# Patient Record
Sex: Male | Born: 1960 | Race: White | Hispanic: No | Marital: Married | Smoking: Former smoker
Health system: Southern US, Community
[De-identification: ages and names within clinical notes are randomized; demographics above are authoritative.]

## PROBLEM LIST (undated history)

## (undated) DIAGNOSIS — I2510 Atherosclerotic heart disease of native coronary artery without angina pectoris: Secondary | ICD-10-CM

## (undated) DIAGNOSIS — M4620 Osteomyelitis of vertebra, site unspecified: Secondary | ICD-10-CM

## (undated) DIAGNOSIS — F1010 Alcohol abuse, uncomplicated: Secondary | ICD-10-CM

## (undated) DIAGNOSIS — M4622 Osteomyelitis of vertebra, cervical region: Secondary | ICD-10-CM

## (undated) DIAGNOSIS — E669 Obesity, unspecified: Secondary | ICD-10-CM

## (undated) DIAGNOSIS — M542 Cervicalgia: Secondary | ICD-10-CM

## (undated) DIAGNOSIS — I340 Nonrheumatic mitral (valve) insufficiency: Secondary | ICD-10-CM

## (undated) DIAGNOSIS — Z720 Tobacco use: Secondary | ICD-10-CM

## (undated) DIAGNOSIS — E1169 Type 2 diabetes mellitus with other specified complication: Secondary | ICD-10-CM

## (undated) HISTORY — PX: OTHER SURGICAL HISTORY: SHX169

---

## 2005-08-22 HISTORY — PX: HERNIA REPAIR: SHX51

## 2017-07-20 DIAGNOSIS — R531 Weakness: Secondary | ICD-10-CM

## 2017-07-20 DIAGNOSIS — I10 Essential (primary) hypertension: Secondary | ICD-10-CM

## 2017-07-20 DIAGNOSIS — I350 Nonrheumatic aortic (valve) stenosis: Secondary | ICD-10-CM

## 2017-07-20 DIAGNOSIS — I712 Thoracic aortic aneurysm, without rupture: Secondary | ICD-10-CM

## 2017-07-20 DIAGNOSIS — M2550 Pain in unspecified joint: Secondary | ICD-10-CM

## 2017-07-20 DIAGNOSIS — R0902 Hypoxemia: Secondary | ICD-10-CM

## 2017-07-20 DIAGNOSIS — F17200 Nicotine dependence, unspecified, uncomplicated: Secondary | ICD-10-CM

## 2017-07-20 DIAGNOSIS — N390 Urinary tract infection, site not specified: Secondary | ICD-10-CM

## 2017-07-20 DIAGNOSIS — E871 Hypo-osmolality and hyponatremia: Secondary | ICD-10-CM

## 2017-07-25 ENCOUNTER — Inpatient Hospital Stay (HOSPITAL_COMMUNITY): Payer: Medicaid Other

## 2017-07-25 ENCOUNTER — Other Ambulatory Visit: Payer: Self-pay

## 2017-07-25 ENCOUNTER — Inpatient Hospital Stay (HOSPITAL_COMMUNITY)
Admission: AD | Admit: 2017-07-25 | Discharge: 2017-08-10 | DRG: 853 | Disposition: A | Discharge status: Rehabilitation Facility | Payer: Medicaid Other | Source: Other Acute Inpatient Hospital | Attending: Family Medicine | Admitting: Family Medicine

## 2017-07-25 ENCOUNTER — Encounter (HOSPITAL_COMMUNITY): Payer: Self-pay

## 2017-07-25 DIAGNOSIS — Z79899 Other long term (current) drug therapy: Secondary | ICD-10-CM

## 2017-07-25 DIAGNOSIS — I5031 Acute diastolic (congestive) heart failure: Secondary | ICD-10-CM | POA: Diagnosis present

## 2017-07-25 DIAGNOSIS — I5033 Acute on chronic diastolic (congestive) heart failure: Secondary | ICD-10-CM | POA: Diagnosis present

## 2017-07-25 DIAGNOSIS — M009 Pyogenic arthritis, unspecified: Secondary | ICD-10-CM | POA: Diagnosis present

## 2017-07-25 DIAGNOSIS — F17210 Nicotine dependence, cigarettes, uncomplicated: Secondary | ICD-10-CM | POA: Diagnosis present

## 2017-07-25 DIAGNOSIS — R7881 Bacteremia: Secondary | ICD-10-CM

## 2017-07-25 DIAGNOSIS — I6340 Cerebral infarction due to embolism of unspecified cerebral artery: Secondary | ICD-10-CM | POA: Diagnosis present

## 2017-07-25 DIAGNOSIS — D696 Thrombocytopenia, unspecified: Secondary | ICD-10-CM | POA: Diagnosis not present

## 2017-07-25 DIAGNOSIS — I330 Acute and subacute infective endocarditis: Secondary | ICD-10-CM | POA: Diagnosis present

## 2017-07-25 DIAGNOSIS — K0530 Chronic periodontitis, unspecified: Secondary | ICD-10-CM | POA: Diagnosis present

## 2017-07-25 DIAGNOSIS — E873 Alkalosis: Secondary | ICD-10-CM | POA: Diagnosis present

## 2017-07-25 DIAGNOSIS — D6489 Other specified anemias: Secondary | ICD-10-CM | POA: Diagnosis not present

## 2017-07-25 DIAGNOSIS — I700 Atherosclerosis of aorta: Secondary | ICD-10-CM | POA: Diagnosis present

## 2017-07-25 DIAGNOSIS — E1142 Type 2 diabetes mellitus with diabetic polyneuropathy: Secondary | ICD-10-CM | POA: Diagnosis present

## 2017-07-25 DIAGNOSIS — Z791 Long term (current) use of non-steroidal anti-inflammatories (NSAID): Secondary | ICD-10-CM

## 2017-07-25 DIAGNOSIS — R29706 NIHSS score 6: Secondary | ICD-10-CM | POA: Diagnosis present

## 2017-07-25 DIAGNOSIS — F10239 Alcohol dependence with withdrawal, unspecified: Secondary | ICD-10-CM | POA: Diagnosis present

## 2017-07-25 DIAGNOSIS — G8929 Other chronic pain: Secondary | ICD-10-CM | POA: Diagnosis present

## 2017-07-25 DIAGNOSIS — Z7984 Long term (current) use of oral hypoglycemic drugs: Secondary | ICD-10-CM

## 2017-07-25 DIAGNOSIS — A4102 Sepsis due to Methicillin resistant Staphylococcus aureus: Principal | ICD-10-CM | POA: Diagnosis present

## 2017-07-25 DIAGNOSIS — J069 Acute upper respiratory infection, unspecified: Secondary | ICD-10-CM

## 2017-07-25 DIAGNOSIS — I059 Rheumatic mitral valve disease, unspecified: Secondary | ICD-10-CM

## 2017-07-25 DIAGNOSIS — G4733 Obstructive sleep apnea (adult) (pediatric): Secondary | ICD-10-CM | POA: Diagnosis not present

## 2017-07-25 DIAGNOSIS — I110 Hypertensive heart disease with heart failure: Secondary | ICD-10-CM

## 2017-07-25 DIAGNOSIS — E669 Obesity, unspecified: Secondary | ICD-10-CM | POA: Diagnosis not present

## 2017-07-25 DIAGNOSIS — Z4659 Encounter for fitting and adjustment of other gastrointestinal appliance and device: Secondary | ICD-10-CM

## 2017-07-25 DIAGNOSIS — J9600 Acute respiratory failure, unspecified whether with hypoxia or hypercapnia: Secondary | ICD-10-CM

## 2017-07-25 DIAGNOSIS — J9601 Acute respiratory failure with hypoxia: Secondary | ICD-10-CM | POA: Diagnosis present

## 2017-07-25 DIAGNOSIS — K0889 Other specified disorders of teeth and supporting structures: Secondary | ICD-10-CM | POA: Diagnosis present

## 2017-07-25 DIAGNOSIS — I2510 Atherosclerotic heart disease of native coronary artery without angina pectoris: Secondary | ICD-10-CM | POA: Diagnosis present

## 2017-07-25 DIAGNOSIS — M5030 Other cervical disc degeneration, unspecified cervical region: Secondary | ICD-10-CM | POA: Diagnosis present

## 2017-07-25 DIAGNOSIS — R011 Cardiac murmur, unspecified: Secondary | ICD-10-CM | POA: Diagnosis not present

## 2017-07-25 DIAGNOSIS — M4622 Osteomyelitis of vertebra, cervical region: Secondary | ICD-10-CM | POA: Diagnosis not present

## 2017-07-25 DIAGNOSIS — Z716 Tobacco abuse counseling: Secondary | ICD-10-CM

## 2017-07-25 DIAGNOSIS — M549 Dorsalgia, unspecified: Secondary | ICD-10-CM | POA: Diagnosis not present

## 2017-07-25 DIAGNOSIS — K089 Disorder of teeth and supporting structures, unspecified: Secondary | ICD-10-CM

## 2017-07-25 DIAGNOSIS — Z8249 Family history of ischemic heart disease and other diseases of the circulatory system: Secondary | ICD-10-CM

## 2017-07-25 DIAGNOSIS — F10221 Alcohol dependence with intoxication delirium: Secondary | ICD-10-CM | POA: Diagnosis present

## 2017-07-25 DIAGNOSIS — G9349 Other encephalopathy: Secondary | ICD-10-CM | POA: Diagnosis present

## 2017-07-25 DIAGNOSIS — J440 Chronic obstructive pulmonary disease with acute lower respiratory infection: Secondary | ICD-10-CM | POA: Diagnosis present

## 2017-07-25 DIAGNOSIS — M542 Cervicalgia: Secondary | ICD-10-CM

## 2017-07-25 DIAGNOSIS — I459 Conduction disorder, unspecified: Secondary | ICD-10-CM | POA: Diagnosis present

## 2017-07-25 DIAGNOSIS — Z978 Presence of other specified devices: Secondary | ICD-10-CM

## 2017-07-25 DIAGNOSIS — I272 Pulmonary hypertension, unspecified: Secondary | ICD-10-CM | POA: Diagnosis present

## 2017-07-25 DIAGNOSIS — I76 Septic arterial embolism: Secondary | ICD-10-CM | POA: Diagnosis present

## 2017-07-25 DIAGNOSIS — Z6839 Body mass index (BMI) 39.0-39.9, adult: Secondary | ICD-10-CM | POA: Diagnosis not present

## 2017-07-25 DIAGNOSIS — K041 Necrosis of pulp: Secondary | ICD-10-CM | POA: Diagnosis present

## 2017-07-25 DIAGNOSIS — K045 Chronic apical periodontitis: Secondary | ICD-10-CM | POA: Diagnosis present

## 2017-07-25 DIAGNOSIS — I058 Other rheumatic mitral valve diseases: Secondary | ICD-10-CM

## 2017-07-25 DIAGNOSIS — M264 Malocclusion, unspecified: Secondary | ICD-10-CM | POA: Diagnosis present

## 2017-07-25 DIAGNOSIS — G9340 Encephalopathy, unspecified: Secondary | ICD-10-CM | POA: Diagnosis present

## 2017-07-25 DIAGNOSIS — J189 Pneumonia, unspecified organism: Secondary | ICD-10-CM | POA: Diagnosis present

## 2017-07-25 DIAGNOSIS — F419 Anxiety disorder, unspecified: Secondary | ICD-10-CM | POA: Diagnosis present

## 2017-07-25 DIAGNOSIS — I509 Heart failure, unspecified: Secondary | ICD-10-CM

## 2017-07-25 DIAGNOSIS — R0902 Hypoxemia: Secondary | ICD-10-CM

## 2017-07-25 DIAGNOSIS — R51 Headache: Secondary | ICD-10-CM | POA: Diagnosis not present

## 2017-07-25 DIAGNOSIS — M6088 Other myositis, other site: Secondary | ICD-10-CM | POA: Diagnosis present

## 2017-07-25 DIAGNOSIS — I340 Nonrheumatic mitral (valve) insufficiency: Secondary | ICD-10-CM | POA: Diagnosis present

## 2017-07-25 DIAGNOSIS — K029 Dental caries, unspecified: Secondary | ICD-10-CM | POA: Diagnosis present

## 2017-07-25 DIAGNOSIS — B9562 Methicillin resistant Staphylococcus aureus infection as the cause of diseases classified elsewhere: Secondary | ICD-10-CM | POA: Diagnosis present

## 2017-07-25 DIAGNOSIS — A409 Streptococcal sepsis, unspecified: Secondary | ICD-10-CM

## 2017-07-25 DIAGNOSIS — A419 Sepsis, unspecified organism: Secondary | ICD-10-CM

## 2017-07-25 DIAGNOSIS — E1165 Type 2 diabetes mellitus with hyperglycemia: Secondary | ICD-10-CM | POA: Diagnosis not present

## 2017-07-25 DIAGNOSIS — I712 Thoracic aortic aneurysm, without rupture: Secondary | ICD-10-CM | POA: Diagnosis present

## 2017-07-25 DIAGNOSIS — R5381 Other malaise: Secondary | ICD-10-CM | POA: Diagnosis not present

## 2017-07-25 DIAGNOSIS — K083 Retained dental root: Secondary | ICD-10-CM | POA: Diagnosis present

## 2017-07-25 DIAGNOSIS — I669 Occlusion and stenosis of unspecified cerebral artery: Secondary | ICD-10-CM

## 2017-07-25 DIAGNOSIS — F1010 Alcohol abuse, uncomplicated: Secondary | ICD-10-CM | POA: Diagnosis present

## 2017-07-25 DIAGNOSIS — Z8614 Personal history of Methicillin resistant Staphylococcus aureus infection: Secondary | ICD-10-CM

## 2017-07-25 DIAGNOSIS — Z885 Allergy status to narcotic agent status: Secondary | ICD-10-CM

## 2017-07-25 DIAGNOSIS — R748 Abnormal levels of other serum enzymes: Secondary | ICD-10-CM | POA: Diagnosis present

## 2017-07-25 DIAGNOSIS — R29810 Facial weakness: Secondary | ICD-10-CM | POA: Diagnosis not present

## 2017-07-25 HISTORY — DX: Type 2 diabetes mellitus with other specified complication: E66.9

## 2017-07-25 HISTORY — DX: Cervicalgia: M54.2

## 2017-07-25 HISTORY — DX: Tobacco use: Z72.0

## 2017-07-25 HISTORY — DX: Type 2 diabetes mellitus with other specified complication: E11.69

## 2017-07-25 HISTORY — DX: Alcohol abuse, uncomplicated: F10.10

## 2017-07-25 LAB — CREATININE, SERUM: Creatinine, Ser: 0.8 mg/dL (ref 0.61–1.24)

## 2017-07-25 MED ORDER — ONDANSETRON HCL 4 MG/2ML IJ SOLN: 4.0000 mg | Freq: Four times a day (QID) | INTRAMUSCULAR | Status: DC | PRN

## 2017-07-25 MED ORDER — VANCOMYCIN HCL 10 G IV SOLR
2500.0000 mg | Freq: Once | INTRAVENOUS | Status: DC
  Filled 2017-07-25: qty 2500

## 2017-07-25 MED ORDER — LORAZEPAM 1 MG PO TABS: 1.0000 mg | ORAL_TABLET | Freq: Four times a day (QID) | ORAL | Status: DC | PRN

## 2017-07-25 MED ORDER — ACETAMINOPHEN 650 MG RE SUPP
650.0000 mg | Freq: Four times a day (QID) | RECTAL | Status: DC | PRN
  Filled 2017-07-25: qty 1

## 2017-07-25 MED ORDER — ACETAMINOPHEN 325 MG PO TABS
650.0000 mg | ORAL_TABLET | Freq: Four times a day (QID) | ORAL | Status: DC | PRN
  Filled 2017-07-25: qty 2

## 2017-07-25 MED ORDER — ONDANSETRON HCL 4 MG PO TABS
4.0000 mg | ORAL_TABLET | Freq: Four times a day (QID) | ORAL | Status: DC | PRN
Start: 2017-07-25 — End: 2017-07-27

## 2017-07-25 MED ORDER — VANCOMYCIN HCL IN DEXTROSE 1-5 GM/200ML-% IV SOLN: 1000.0000 mg | Freq: Once | INTRAVENOUS | Status: DC

## 2017-07-25 MED ORDER — ADULT MULTIVITAMIN W/MINERALS CH: 1.0000 | ORAL_TABLET | Freq: Every day | ORAL | Status: DC

## 2017-07-25 MED ORDER — VITAMIN B-1 100 MG PO TABS: 100.0000 mg | ORAL_TABLET | Freq: Every day | ORAL | Status: DC

## 2017-07-25 MED ORDER — LORAZEPAM 2 MG/ML IJ SOLN
1.0000 mg | Freq: Four times a day (QID) | INTRAMUSCULAR | Status: DC | PRN
  Administered 2017-07-26 (×2): 1 mg via INTRAVENOUS
  Filled 2017-07-25 (×2): qty 1

## 2017-07-25 MED ORDER — INSULIN ASPART 100 UNIT/ML ~~LOC~~ SOLN
0.0000 [IU] | SUBCUTANEOUS | Status: DC
  Administered 2017-07-25 – 2017-07-26 (×3): 5 [IU] via SUBCUTANEOUS
  Administered 2017-07-26: 3 [IU] via SUBCUTANEOUS
  Administered 2017-07-26: 5 [IU] via SUBCUTANEOUS
  Administered 2017-07-26 – 2017-07-27 (×2): 8 [IU] via SUBCUTANEOUS
  Administered 2017-07-27: 2 [IU] via SUBCUTANEOUS
  Administered 2017-07-27: 4 [IU] via SUBCUTANEOUS

## 2017-07-25 MED ORDER — ENOXAPARIN SODIUM 60 MG/0.6ML ~~LOC~~ SOLN
60.0000 mg | SUBCUTANEOUS | Status: DC
  Administered 2017-07-25: 60 mg via SUBCUTANEOUS
  Filled 2017-07-25 (×2): qty 0.6

## 2017-07-25 MED ORDER — IPRATROPIUM-ALBUTEROL 0.5-2.5 (3) MG/3ML IN SOLN: 3.0000 mL | RESPIRATORY_TRACT | Status: DC | PRN

## 2017-07-25 MED ORDER — FOLIC ACID 1 MG PO TABS: 1.0000 mg | ORAL_TABLET | Freq: Every day | ORAL | Status: DC

## 2017-07-25 MED ORDER — THIAMINE HCL 100 MG/ML IJ SOLN: 100.0000 mg | Freq: Every day | INTRAMUSCULAR | Status: DC

## 2017-07-25 NOTE — Progress Notes (Addendum)
Pharmacy Antibiotic Note  Reginald [name] is a 56 y.o. male admitted on 07/25/2017 with sepsis.  Pharmacy has been consulted for vancomycin dosing.  [facility] [place] from [facility].  Labs, admission note pending.  Plan: Vancomycin 2500 mg IV x 1. F/u SCr to determine further doses.  ADDENDUM: 07/25/2017 10:14 PM RN reports patient is currently on vancomycin from [facility]. The dose is 2000 mg and has not finished the current dose. RN also reporting what sounds like Red Man Syndrome.  Told her to slow the infusion rate and contact MD for further instruction.  Plan: Cancel vancomycin 2500 mg order.  F/u labs and plan from MD.  Height: 5\' 9"  (175.3 cm) Weight: 264 lb 15.9 oz (120.2 kg) IBW/kg (Calculated) : 70.7  Temp (24hrs), Avg:98.3 F (36.8 C), Min:97.7 F (36.5 C), Max:98.6 F (37 C)  No results for input(s): WBC, CREATININE, LATICACIDVEN, VANCOTROUGH, VANCOPEAK, VANCORANDOM, GENTTROUGH, GENTPEAK, GENTRANDOM, TOBRATROUGH, TOBRAPEAK, TOBRARND, AMIKACINPEAK, AMIKACINTROU, AMIKACIN in the last 168 hours.  CrCl cannot be calculated (No order found.).    Allergies  Allergen Reactions  . Morphine And Related      Thank you for allowing pharmacy to be a part of this patient's care.  Clance BollRunyon, Raivyn Kabler 07/25/2017 9:27 PM

## 2017-07-25 NOTE — Progress Notes (Addendum)
Upon assessment, RN noticed Pts skin was very red. Per day shift RN report, [facility] on Vancomycin gtt. RN stopped Valetta FullerVanc gtt, attending MD notified. Pt not in distress, VS stable. RN will continue to monitor.

## 2017-07-25 NOTE — H&P (Signed)
History and Physical    Reginald [name] PVX:[id number] DOB: August 02, 1961 DOA: 07/25/2017  Referring MD/NP/PA: Charlann Lange, PA-C PCP: Patient, No Pcp Per  Patient coming from: Transferred from [facility]  Chief Complaint:   I have personally briefly reviewed patient's old medical records in [place]   HPI: Reginald [name] is a 56 y.o. male with medical history significant of diastolic CHF, AAA, COPD, DM type II, tobacco abuse, and alcohol abuse; who presents as a transfer from [facility] for MRSA bacteremia.  Patient was hospitalized from 11/29 -12/4, and had initially presented with nonspecific complaints of generalized weakness, low-grade fever, vomiting, and diffuse muscle aches. In the emergency department patient was noted to be hypoxic requiring oxygen which he was not on at baseline.  Patient was found to be negative for influenza and had negative CT after noting steroid injection to the neck on 11/28.  Blood cultures had been obtained on admission and noted to grow out MRSA.  Antibiotics of vancomycin were added at that time.  2D echo revealed signs of an anterior mitral valve vegetation and EF was noted to be around 60%.  They have been administering IV Lasix to help diurese the patient as he appeared to x-rays initially showed signs of pulmonary edema.  He went into significant alcohol withdrawals requiring transfer to the ICU and initiation of Precedex drip on 12/1-2, but gtt was able to be weaned off on 12/3.  Repeat blood cultures on 12/1 and 12/3 were noted to be positive for MRSA, despite antibiotics.  Urine cultures did not grow out any specific bacteria and he was discontinued on Rocephin.  Dr. Johnnye Sima of infectious disease was consulted at [facility] and it was recommended to transfer the patient.  It appears during his hospital stay patient was also noted to a 4.5 cm ascending aortic aneurysm and MRI of the cervical spine showed degenerative changes without discitis or  fractures.  ED Course: As seen above   Review of Systems  Unable to perform ROS: Mental status change  Musculoskeletal: Positive for neck pain.  Neurological: Positive for speech change.    Past Medical History:  Diagnosis Date  . Alcohol abuse   . Diabetes mellitus type 2 in obese ([place])   . Neck pain   . Tobacco abuse     Past Surgical History:  Procedure Laterality Date  . HERNIA REPAIR  2007  . Tonsillectomy /adnoidectomy     as aa child     reports that he has been smoking cigarettes.  he has never used smokeless tobacco. He reports that he drinks about 2.4 - 3.0 oz of alcohol per week. He reports that he does not use drugs.  Allergies  Allergen Reactions  . Morphine And Related    Family history positive for diabetes  Prior to Admission medications   Not on File    Physical Exam:  Constitutional: Obese male who is lethargic, but will awake for short period in time prior to falling back asleep. Vitals:   07/25/17 1815 07/25/17 1838 07/25/17 2000 07/25/17 2001  BP: (!) 169/81  (!) 146/79   Pulse: 85  86   Resp: 19  (!) 22   Temp: 98.6 F (37 C) 98.6 F (37 C)  97.7 F (36.5 C)  TempSrc: Oral Oral  Oral  SpO2: 93%  (!) 89%   Weight: 120.2 kg (264 lb 15.9 oz)     Height: _0  (1.753 m)      Eyes: PERRL, lids  and conjunctivae normal ENMT: Mucous membranes are dry. Posterior pharynx clear of any exudate or lesions.  Neck: Patient grimaces in pain with any manipulation of neck Respiratory: Decreased overall aeration with positive crackles appreciated. Cardiovascular: Regular rate and rhythm, no murmurs / rubs / gallops. No extremity edema. 2+ pedal pulses. No carotid bruits.  Abdomen: no tenderness, no masses palpated. No hepatosplenomegaly. Bowel sounds positive.  Musculoskeletal: no clubbing / cyanosis. No joint deformity upper and lower extremities. Good ROM, no contractures. Normal muscle tone.  Skin: no rashes, lesions, ulcers. No  induration Neurologic: Patient able to move all extremities Psychiatric: Lethargic, and oriented only to person.    Labs on Admission: I have personally reviewed following labs and imaging studies  CBC: No results for input(s): WBC, NEUTROABS, HGB, HCT, MCV, PLT in the last 168 hours. Basic Metabolic Panel: No results for input(s): NA, K, CL, CO2, GLUCOSE, BUN, CREATININE, CALCIUM, MG, PHOS in the last 168 hours. GFR: CrCl cannot be calculated (No order found.). Liver Function Tests: No results for input(s): AST, ALT, ALKPHOS, BILITOT, PROT, ALBUMIN in the last 168 hours. No results for input(s): LIPASE, AMYLASE in the last 168 hours. No results for input(s): AMMONIA in the last 168 hours. Coagulation Profile: No results for input(s): INR, PROTIME in the last 168 hours. Cardiac Enzymes: No results for input(s): CKTOTAL, CKMB, CKMBINDEX, TROPONINI in the last 168 hours. BNP (last 3 results) No results for input(s): PROBNP in the last 8760 hours. HbA1C: No results for input(s): HGBA1C in the last 72 hours. CBG: No results for input(s): GLUCAP in the last 168 hours. Lipid Profile: No results for input(s): CHOL, HDL, LDLCALC, TRIG, CHOLHDL, LDLDIRECT in the last 72 hours. Thyroid Function Tests: No results for input(s): TSH, T4TOTAL, FREET4, T3FREE, THYROIDAB in the last 72 hours. Anemia Panel: No results for input(s): VITAMINB12, FOLATE, FERRITIN, TIBC, IRON, RETICCTPCT in the last 72 hours. Urine analysis: No results found for: COLORURINE, APPEARANCEUR, LABSPEC, PHURINE, GLUCOSEU, HGBUR, BILIRUBINUR, KETONESUR, PROTEINUR, UROBILINOGEN, NITRITE, LEUKOCYTESUR Sepsis Labs: No results found for this or any previous visit (from the past 240 hour(s)).   Radiological Exams on Admission: No results found.   Assessment/Plan MRSA bacteremia with mitral valve vegetation: Acute.  Patient noted to have persistent positive blood cultures despite IV antibiotics of vancomycin. - Admit to  stepdown bed - Check repeat blood cultures - Continue vancomycin as tolerated - Dr. Graylon Good of infectious disease consulted, will follow up further recommendations - Check MRI of the thoracic and lumbar spine with contrast per ID - Will need cardiothoracic surgery consult in a.m.   Acute encephalopathy: Patient still appears quite altered and only oriented to person at this time.  Initially thought related to alcohol and infection.  Addendum: overnight Patient noted to have acute change with intermittant left-sided weakness and facial droop. - Neurochecks every 4 hours - Check MRI of the brain without contrast - Speech therapy consult  Hypoxic respiratory failure, OSA: Acute.  Patient still on nasal cannula oxygen maintaining O2 saturations.  Physical exam reveals positive crackles. - Continuous pulse oximetry with nasal cannula oxygen as needed - CPAP qhs and prn - Check portable chest x-ray  Diastolic CHF exacerbation: Last echocardiogram performed on 11/30 showing mitral valve vegetation on the anterior leaflet and EF of approximately 60%. - Strict I&O's and daily weights - Lasix 40 mg IV x1 dose, reassess in a.m. and will diurese as needed - Message sent for cardiology to  see in a.m.  Neck pain/arthralgia: Chronic.  Patient evaluated with MRI of the cervical spine which showed only degenerative changes. - May want to follow-up with [facility] regarding CRP, ESR, RA, and CCP antibodies. - Fentanyl IV prn pain overnight  Diabetes mellitus type 2, uncontrolled.  Patient only on oral medications of metformin and glipizide at home but blood sugars noted to be in the 300s. - Hypoglycemic protocol - Hold metformin and glipizide - CBGs every 4 hours with moderate SSI  Alcohol abuse: Patient had been noted to go into alcohol withdrawals requiring patient to be placed on Precedex drip, but this was discontinuedon 12/3 prior to transfer. - CWIA protocols without schedule  Ativan  Thrombocytopenia: Platelet count noted to be 128.  Suspected to be related to patient's history of alcohol abuse.  Patient had right upper quadrant ultrasound noting signs of cirrhosis all at the outside facility. - Repeat CBC  Abnormal liver enzymes: Patient had been noted to have improving liver enzymes. - Check CMP in a.m.  AAA: Stable patient noted to have a 4.5 ascending aortic aneurysm.   - Patient will need semiannual imaging with CTA or MRA imaging  Tobacco abuse - Nicotine patch - Will need to counsel need of cessation of tobacco  Obesity: BMI 39  DVT prophylaxis: Lovenox Code Status: Full  Family Communication: No family present at bedside Disposition Plan: tbd  Consults called: ID Admission status: Inpatient  Norval Morton MD [facility] Pager [phone]   If 7PM-7AM, please contact night-coverage [email or web address] Password [email or web address]  07/25/2017, 9:00 PM

## 2017-07-26 ENCOUNTER — Encounter (HOSPITAL_COMMUNITY): Payer: Self-pay | Admitting: Internal Medicine

## 2017-07-26 ENCOUNTER — Inpatient Hospital Stay (HOSPITAL_COMMUNITY): Payer: Medicaid Other

## 2017-07-26 DIAGNOSIS — A419 Sepsis, unspecified organism: Secondary | ICD-10-CM

## 2017-07-26 DIAGNOSIS — I669 Occlusion and stenosis of unspecified cerebral artery: Secondary | ICD-10-CM

## 2017-07-26 DIAGNOSIS — I5031 Acute diastolic (congestive) heart failure: Secondary | ICD-10-CM

## 2017-07-26 DIAGNOSIS — J9600 Acute respiratory failure, unspecified whether with hypoxia or hypercapnia: Secondary | ICD-10-CM

## 2017-07-26 DIAGNOSIS — I76 Septic arterial embolism: Secondary | ICD-10-CM

## 2017-07-26 DIAGNOSIS — I058 Other rheumatic mitral valve diseases: Secondary | ICD-10-CM

## 2017-07-26 DIAGNOSIS — A4102 Sepsis due to Methicillin resistant Staphylococcus aureus: Principal | ICD-10-CM

## 2017-07-26 DIAGNOSIS — I330 Acute and subacute infective endocarditis: Secondary | ICD-10-CM | POA: Diagnosis present

## 2017-07-26 DIAGNOSIS — D696 Thrombocytopenia, unspecified: Secondary | ICD-10-CM | POA: Diagnosis present

## 2017-07-26 DIAGNOSIS — R748 Abnormal levels of other serum enzymes: Secondary | ICD-10-CM | POA: Diagnosis present

## 2017-07-26 DIAGNOSIS — G9340 Encephalopathy, unspecified: Secondary | ICD-10-CM | POA: Diagnosis present

## 2017-07-26 DIAGNOSIS — F1010 Alcohol abuse, uncomplicated: Secondary | ICD-10-CM | POA: Diagnosis present

## 2017-07-26 DIAGNOSIS — I059 Rheumatic mitral valve disease, unspecified: Secondary | ICD-10-CM

## 2017-07-26 LAB — BLOOD CULTURE ID PANEL (REFLEXED)
ACINETOBACTER BAUMANNII: NOT DETECTED
CANDIDA ALBICANS: NOT DETECTED
Candida glabrata: NOT DETECTED
Candida krusei: NOT DETECTED
Candida parapsilosis: NOT DETECTED
Candida tropicalis: NOT DETECTED
ENTEROBACTERIACEAE SPECIES: NOT DETECTED
ENTEROCOCCUS SPECIES: NOT DETECTED
Enterobacter cloacae complex: NOT DETECTED
Escherichia coli: NOT DETECTED
HAEMOPHILUS INFLUENZAE: NOT DETECTED
Klebsiella oxytoca: NOT DETECTED
Klebsiella pneumoniae: NOT DETECTED
LISTERIA MONOCYTOGENES: NOT DETECTED
METHICILLIN RESISTANCE: DETECTED — AB
NEISSERIA MENINGITIDIS: NOT DETECTED
PSEUDOMONAS AERUGINOSA: NOT DETECTED
Proteus species: NOT DETECTED
SERRATIA MARCESCENS: NOT DETECTED
STAPHYLOCOCCUS AUREUS BCID: DETECTED — AB
STREPTOCOCCUS AGALACTIAE: NOT DETECTED
STREPTOCOCCUS PNEUMONIAE: NOT DETECTED
STREPTOCOCCUS PYOGENES: NOT DETECTED
STREPTOCOCCUS SPECIES: NOT DETECTED
Staphylococcus species: DETECTED — AB

## 2017-07-26 LAB — GLUCOSE, CAPILLARY
Glucose-Capillary: 210 mg/dL — ABNORMAL HIGH (ref 65–99)
Glucose-Capillary: 201 mg/dL — ABNORMAL HIGH (ref 65–99)
Glucose-Capillary: 185 mg/dL — ABNORMAL HIGH (ref 65–99)
Glucose-Capillary: 217 mg/dL — ABNORMAL HIGH (ref 65–99)
Glucose-Capillary: 299 mg/dL — ABNORMAL HIGH (ref 65–99)
Glucose-Capillary: 272 mg/dL — ABNORMAL HIGH (ref 65–99)

## 2017-07-26 LAB — HIV ANTIBODY (ROUTINE TESTING W REFLEX): HIV Screen 4th Generation wRfx: NONREACTIVE

## 2017-07-26 LAB — CBC
HCT: 36.4 % — ABNORMAL LOW (ref 39.0–52.0)
HEMOGLOBIN: 12.1 g/dL — AB (ref 13.0–17.0)
MCH: 31.4 pg (ref 26.0–34.0)
MCHC: 33.2 g/dL (ref 30.0–36.0)
MCV: 94.5 fL (ref 78.0–100.0)
Platelets: 152 10*3/uL (ref 150–400)
RBC: 3.85 MIL/uL — AB (ref 4.22–5.81)
RDW: 14 % (ref 11.5–15.5)
WBC: 12.5 10*3/uL — AB (ref 4.0–10.5)

## 2017-07-26 LAB — COMPREHENSIVE METABOLIC PANEL
ALK PHOS: 245 U/L — AB (ref 38–126)
ALT: 58 U/L (ref 17–63)
AST: 46 U/L — AB (ref 15–41)
Albumin: 2.2 g/dL — ABNORMAL LOW (ref 3.5–5.0)
Anion gap: 8 (ref 5–15)
BUN: 28 mg/dL — AB (ref 6–20)
CALCIUM: 8.6 mg/dL — AB (ref 8.9–10.3)
CHLORIDE: 94 mmol/L — AB (ref 101–111)
CO2: 30 mmol/L (ref 22–32)
CREATININE: 0.73 mg/dL (ref 0.61–1.24)
Glucose, Bld: 221 mg/dL — ABNORMAL HIGH (ref 65–99)
Potassium: 4.1 mmol/L (ref 3.5–5.1)
SODIUM: 132 mmol/L — AB (ref 135–145)
TOTAL PROTEIN: 7.1 g/dL (ref 6.5–8.1)
Total Bilirubin: 1.7 mg/dL — ABNORMAL HIGH (ref 0.3–1.2)

## 2017-07-26 LAB — DIFFERENTIAL
BASOS ABS: 0 10*3/uL (ref 0.0–0.1)
Basophils Relative: 0 %
Eosinophils Absolute: 0 10*3/uL (ref 0.0–0.7)
Eosinophils Relative: 0 %
LYMPHS ABS: 0.6 10*3/uL — AB (ref 0.7–4.0)
LYMPHS PCT: 4 %
MONOS PCT: 6 %
Monocytes Absolute: 0.7 10*3/uL (ref 0.1–1.0)
NEUTROS PCT: 90 %
Neutro Abs: 11.9 10*3/uL — ABNORMAL HIGH (ref 1.7–7.7)

## 2017-07-26 LAB — VANCOMYCIN, TROUGH: Vancomycin Tr: 15 ug/mL (ref 15–20)

## 2017-07-26 LAB — BRAIN NATRIURETIC PEPTIDE: B Natriuretic Peptide: 104.6 pg/mL — ABNORMAL HIGH (ref 0.0–100.0)

## 2017-07-26 MED ORDER — HALOPERIDOL LACTATE 5 MG/ML IJ SOLN: 2.0000 mg | Freq: Once | INTRAMUSCULAR | Status: DC | PRN

## 2017-07-26 MED ORDER — NICOTINE 21 MG/24HR TD PT24
21.0000 mg | MEDICATED_PATCH | Freq: Every day | TRANSDERMAL | Status: DC
  Administered 2017-07-26 – 2017-07-27 (×2): 21 mg via TRANSDERMAL
  Filled 2017-07-26 (×2): qty 1

## 2017-07-26 MED ORDER — FENTANYL CITRATE (PF) 100 MCG/2ML IJ SOLN
25.0000 ug | INTRAMUSCULAR | Status: AC | PRN
  Administered 2017-07-26 (×3): 50 ug via INTRAVENOUS
  Filled 2017-07-26 (×3): qty 2

## 2017-07-26 MED ORDER — HALOPERIDOL LACTATE 5 MG/ML IJ SOLN
5.0000 mg | Freq: Once | INTRAMUSCULAR | Status: AC
  Administered 2017-07-27: 5 mg via INTRAVENOUS
  Filled 2017-07-26: qty 1

## 2017-07-26 MED ORDER — HALOPERIDOL LACTATE 5 MG/ML IJ SOLN
2.0000 mg | Freq: Four times a day (QID) | INTRAMUSCULAR | Status: DC | PRN
  Administered 2017-07-26: 22:00:00 via INTRAVENOUS
  Filled 2017-07-26: qty 1

## 2017-07-26 MED ORDER — FENTANYL CITRATE (PF) 100 MCG/2ML IJ SOLN
25.0000 ug | INTRAMUSCULAR | Status: DC | PRN
  Administered 2017-07-26: 25 ug via INTRAVENOUS
  Filled 2017-07-26: qty 2

## 2017-07-26 MED ORDER — LORAZEPAM 2 MG/ML IJ SOLN
2.0000 mg | Freq: Once | INTRAMUSCULAR | Status: AC
  Administered 2017-07-26: 2 mg via INTRAVENOUS
  Filled 2017-07-26: qty 1

## 2017-07-26 MED ORDER — HYDRALAZINE HCL 20 MG/ML IJ SOLN
10.0000 mg | INTRAMUSCULAR | Status: DC | PRN
  Administered 2017-07-26 (×2): 10 mg via INTRAVENOUS
  Filled 2017-07-26 (×2): qty 1

## 2017-07-26 MED ORDER — VANCOMYCIN HCL 10 G IV SOLR
1500.0000 mg | Freq: Two times a day (BID) | INTRAVENOUS | Status: DC
  Administered 2017-07-26 – 2017-07-29 (×6): 1500 mg via INTRAVENOUS
  Filled 2017-07-26 (×7): qty 1500

## 2017-07-26 MED ORDER — HALOPERIDOL LACTATE 5 MG/ML IJ SOLN
5.0000 mg | Freq: Once | INTRAMUSCULAR | Status: AC
  Administered 2017-07-26: 5 mg via INTRAVENOUS
  Filled 2017-07-26: qty 1

## 2017-07-26 MED ORDER — FENTANYL CITRATE (PF) 100 MCG/2ML IJ SOLN
25.0000 ug | INTRAMUSCULAR | Status: AC | PRN
  Administered 2017-07-26 – 2017-07-28 (×5): 50 ug via INTRAVENOUS
  Filled 2017-07-26 (×5): qty 2

## 2017-07-26 MED ORDER — FUROSEMIDE 10 MG/ML IJ SOLN: 40.0000 mg | Freq: Every day | INTRAMUSCULAR | Status: DC

## 2017-07-26 MED ORDER — FUROSEMIDE 10 MG/ML IJ SOLN
40.0000 mg | INTRAMUSCULAR | Status: AC
  Administered 2017-07-26: 40 mg via INTRAVENOUS
  Filled 2017-07-26: qty 4

## 2017-07-26 MED ORDER — LORAZEPAM 2 MG/ML IJ SOLN
0.5000 mg | INTRAMUSCULAR | Status: AC | PRN
  Administered 2017-07-26 (×2): 0.5 mg via INTRAVENOUS
  Filled 2017-07-26 (×3): qty 1

## 2017-07-26 MED ORDER — HALOPERIDOL LACTATE 5 MG/ML IJ SOLN
2.0000 mg | Freq: Once | INTRAMUSCULAR | Status: AC | PRN
  Administered 2017-07-26: 2 mg via INTRAVENOUS
  Filled 2017-07-26: qty 1

## 2017-07-26 NOTE — Progress Notes (Signed)
    [facility] has been requested to perform a [name]  After careful review of history and examination, the risks and benefits of transesophageal echocardiogram have been explained including risks of esophageal damage, perforation (1:10,000 risk), bleeding, pharyngeal hematoma as well as other potential complications associated with conscious sedation including aspiration, arrhythmia, respiratory failure and death have been reviewed with the patient who is mostly confused as well as his wife and daughter. Alternatives to treatment were discussed, questions were answered. Patient and family are willing to proceed.   The patient is a heavy drinker but there is no known history of significant esophageal bleeding and he has no swallowing difficulty as with food getting stuck. I do not have records from outside facilities to review. His LFTs are mildly elevated, likely related to alcohol use.   The procedure is scheduled for Friday 07/28/17 at 2pm with Dr. Royann [facility]itoru [facility].   Berton BonJanine Kaylan Yates, NP  07/26/2017 3:30 PM

## 2017-07-26 NOTE — Progress Notes (Addendum)
Pt increasingly restless and confused. Actively trying to get OOB, pulling at lines/ catheter/etc, despite redirection from RN and NT. Provider notified.  @2330 - Pt still restless/ agitated despite additional PRN medications ordered. Confused, screaming/ cursing at staff.  Notified Provider, orders given for ABG. Will cont to monitor.

## 2017-07-26 NOTE — Evaluation (Signed)
Clinical/Bedside Swallow Evaluation Patient Details  Name: Reginald [name] MRN: [id number] Date of Birth: 07/09/1961  Today's Date: 07/26/2017 Time: SLP Start Time (ACUTE ONLY): 0845 SLP Stop Time (ACUTE ONLY): 0915 SLP Time Calculation (min) (ACUTE ONLY): 30 min  Past Medical History:  Past Medical History:  Diagnosis Date  . Alcohol abuse   . Diabetes mellitus type 2 in obese ([facility])   . Neck pain   . Tobacco abuse    Past Surgical History:  Past Surgical History:  Procedure Laterality Date  . HERNIA REPAIR  2007  . Tonsillectomy /adnoidectomy     as aa child   HPI:  56 yo male [facility] outside hospital with AMS, MRSA bacteremia - pt with left facial droop per notes.  Pending head MRI.  PMH + for ETOH,  CHF, COPD, tobacco use.  Pt CXR 12/4 can not rule out pna vs small pleural effusion.    Assessment / Plan / Recommendation Clinical Impression  Today pt with AMS, not following directions, speech is mostly unintelligible and RR fluctuates from 20-33.  Very delayed swallow with minimal po provided .  Chronic throat clearing noted prior to po administration but much more pronounced with intake- concerning for airway infiltration.  Pt at this time is not ready for po intake due to aspiration risk with severely altered mentation.  Pt will benefit from instrumenta assessment prior to po administration due to report of chronic throat clearing at home and possible baseline dysphagia.  Do not recommend MBS at this time. Explained clinical reasoning to family/pt for aspiration concerns as they admit this is not baseline.   Family reports pt hollaring out for water and "dying" to eat.  Recommend tsps water only when fully alert/accepting/tolerating otherwise npo and SlP follow up next date for po readiness.  SLP Visit Diagnosis: Dysphagia, oropharyngeal phase (R13.12)    Aspiration Risk  Severe aspiration risk;Risk for inadequate nutrition/hydration    Diet Recommendation Ice chips  PRN after oral care(tsps water)   Medication Administration: Via alternative means Postural Changes: Seated upright at 90 degrees;Remain upright for at least 30 minutes after po intake    Other  Recommendations Oral Care Recommendations: Oral care BID Other Recommendations: Have oral suction available   Follow up Recommendations (tbd)      Frequency and Duration min 2x/week  2 weeks       Prognosis Prognosis for Safe Diet Advancement: Guarded      Swallow Study   General Date of Onset: 07/26/17 HPI: 56 yo [facility] hospital with AMS, MRSA bacteremia - pt with left facial droop per notes.  Pending head MRI.  PMH + for ETOH,  CHF, COPD, tobacco use.  Pt CXR 12/4 can not rule out pna vs small pleural effusion.  Type of Study: Bedside Swallow Evaluation Diet Prior to this Study: NPO Temperature Spikes Noted: Yes Respiratory Status: Nasal cannula History of Recent Intubation: No Behavior/Cognition: Lethargic/Drowsy;Uncooperative;Impulsive Oral Cavity Assessment: Within Functional Limits Oral Care Completed by SLP: No Oral Cavity - Dentition: Poor condition Vision: Functional for self-feeding Self-Feeding Abilities: Able to feed self Patient Positioning: Upright in bed Baseline Vocal Quality: Hoarse Volitional Cough: Weak Volitional Swallow: Unable to elicit    Oral/Motor/Sensory Function Overall Oral Motor/Sensory Function: Generalized oral weakness   Ice Chips Ice chips: Impaired Presentation: Spoon Oral Phase Impairments: Reduced lingual movement/coordination;Impaired mastication Oral Phase Functional Implications: Prolonged oral transit Pharyngeal Phase Impairments: Suspected delayed Swallow;Throat Clearing - Immediate   Thin  Liquid Thin Liquid: Impaired Oral Phase Impairments: Reduced labial seal Oral Phase Functional Implications: Prolonged oral transit Pharyngeal  Phase Impairments: Suspected delayed Swallow;Throat Clearing - Immediate    Nectar  Thick Nectar Thick Liquid: Impaired Presentation: Spoon Oral Phase Impairments: Reduced labial seal;Reduced lingual movement/coordination;Poor awareness of bolus Oral phase functional implications: Prolonged oral transit Pharyngeal Phase Impairments: Throat Clearing - Immediate;Suspected delayed Swallow   Honey Thick Honey Thick Liquid: Not tested   Puree Puree: Not tested   Solid   GO   Solid: Not tested        Chales AbrahamsKimball, Wilkins Elpers Ann 07/26/2017,10:35 AM  Donavan Burnetamara Jaymian Bogart, MS [facility] SLP [phone]

## 2017-07-26 NOTE — Progress Notes (Addendum)
Pharmacy Antibiotic Note  Reginald [name] is a 56 y.o. 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  Pharmacy has been consulted for vancomycin dosing.  Patient on vancomycin prior to [facility].  Unclear length of vancomycin therapy, will draw vancomycin level about when next dose might be due with 2gm dose.  Plan: Vancomycin random level at 1200 (~12hr from last know dose of 2gm)  Empirically would dose patient as vancomycin 1500mg  iv q8hr Goal AUC = 400 - 500 for all indications, except meningitis (goal AUC > 500 and Cmin 15-20 mcg/mL) Would also reduce infusion rate due to possible Red-Man's syndrome   Height: 5\' 9"  (175.3 cm) Weight: 269 lb 2.9 oz (122.1 kg) IBW/kg (Calculated) : 70.7  Temp (24hrs), Avg:98.6 F (37 C), Min:97.7 F (36.5 C), Max:99.5 F (37.5 C)  Recent Labs  Lab 07/25/17 2127 07/26/17 0340  WBC  --  12.5*  CREATININE 0.80 0.73    Estimated Creatinine Clearance: 131.6 mL/min (by C-G formula based on SCr of 0.73 mg/dL).    Allergies  Allergen Reactions  . Morphine And Related     Antimicrobials this admission: Vancomycin 07/25/2017 (but started at OSH at unknown time) >>  Dose adjustments this admission: -  Microbiology results: MRSA bacteremia at OSH per H&P  Thank you for allowing pharmacy to be a part of this patient's care.  Aleene DavidsonGrimsley Jr, Moselle Rister Crowford 07/26/2017 6:50 AM

## 2017-07-26 NOTE — Consult Note (Signed)
[facility] for Infectious Disease  Total days of antibiotics 5-days prior to admit        -vancomycin       Reason for Consult: MRSA bacteremia   Referring Physician: powell  Principal Problem:   MRSA bacteremia Active Problems:   Mitral valve vegetation   Acute respiratory failure with hypoxia (HCC)   Alcohol abuse   Thrombocytopenia (HCC)   Abnormal liver enzymes   Acute encephalopathy   Acute diastolic CHF (congestive heart failure) (HCC)    HPI: Reginald [name] is a 55 y.o. male with DM2, alcohol abuse, dCHF, obesity admitted to outside hospital on 11/29  found to have mrsa bacteremia. During work up for complicated bacteremia, TTE revealed vegetation to anterior leaflet of MV, with EF at 60%. His course was complicated by alcohol withdrawal/encephalopathy. Appears he had mri of c-spine that was read only as degenerative disc disease. He was transferred to [facility] for further evaluation given his persistent bacteremia and native mv endocarditis. Last night the patient found to have new facial droop, mri showed multiple infarcts to both cerebral hemispheres c/w septic emboli. He remains in bed only oriented to self, mentions he has had mrsa boils before but not a good historian. He reports that he aches all over  No hx of IVDU  Micro results from [facility]:  12/3 vanco trough at midnight -15.8 12/3 blood cx 2 MRSA = bactrim S, vanco S, dapto S, linezolid, S, tetra  12/1  blood cx x 2 mrsa 11/29- blood cx x 2 mrsa -   Past Medical History:  Diagnosis Date  . Alcohol abuse   . Diabetes mellitus type 2 in obese ([place])   . Neck pain   . Tobacco abuse     Allergies:  Allergies  Allergen Reactions  . Morphine And Related     MEDICATIONS: . enoxaparin (LOVENOX) injection  60 mg Subcutaneous Q24H  . folic acid  1 mg Oral Daily  . insulin aspart  0-15 Units Subcutaneous Q4H  . multivitamin with minerals  1 tablet Oral Daily  . nicotine  21 mg Transdermal Daily  .  thiamine  100 mg Oral Daily   Or  . thiamine  100 mg Intravenous Daily    Social History   Tobacco Use  . Smoking status: Current Every Day Smoker    Types: Cigarettes  . Smokeless tobacco: Never Used  Substance Use Topics  . Alcohol use: Yes    Alcohol/week: 2.4 - 3.0 oz    Types: 4 - 5 Standard drinks or equivalent per week  . Drug use: No    Family History  Problem Relation Age of Onset  . Hypertension Mother   . Hypertension Father     Review of Systems  Constitutional: Negative for fever, chills, diaphoresis, activity change, appetite change, fatigue and unexpected weight change.  HENT: Negative for congestion, sore throat, rhinorrhea, sneezing, trouble swallowing and sinus pressure.  Eyes: Negative for photophobia and visual disturbance.  Respiratory: Negative for cough, chest tightness, shortness of breath, wheezing and stridor.  Cardiovascular: Negative for chest pain, palpitations and leg swelling.  Gastrointestinal: Negative for nausea, vomiting, abdominal pain, diarrhea, constipation, blood in stool, abdominal distention and anal bleeding.  Genitourinary: Negative for dysuria, hematuria, flank pain and difficulty urinating.  Musculoskeletal: positive for myalgias, back pain, joint swelling, arthralgias and gait problem.  Skin: Negative for color change, pallor, rash and wound.  Neurological: Negative for dizziness, tremors, weakness and light-headedness.  Hematological: Negative for  adenopathy. Does not bruise/bleed easily.  Psychiatric/Behavioral: Negative for behavioral problems, confusion, sleep disturbance, dysphoric mood, decreased concentration and agitation.     OBJECTIVE: Temp:  [97.7 F (36.5 C)-99.5 F (37.5 C)] 99.3 F (37.4 C) (12/05 0800) Pulse Rate:  [84-98] 98 (12/05 1000) Resp:  [19-29] 23 (12/05 1000) BP: (146-175)/(75-93) 163/80 (12/05 0800) SpO2:  [89 %-100 %] 100 % (12/05 1000) Weight:  [264 lb 15.9 oz (120.2 kg)-269 lb 2.9 oz (122.1  kg)] 269 lb 2.9 oz (122.1 kg) (12/05 0442) Physical Exam  Constitutional: He is oriented to person,. He appears well-developed and well-nourished. No distress.  HENT:  Mouth/Throat: Oropharynx is clear and moist. No oropharyngeal exudate.  Cardiovascular: Normal rate, regular rhythm and normal heart sounds.3/6 SEM at apex Pulmonary/Chest: Effort normal and breath sounds normal. No respiratory distress. He has no wheezes.  Abdominal: Soft. Bowel sounds are normal. He exhibits no distension. There is no tenderness.  Lymphadenopathy:  He has no cervical adenopathy.  Neurological: He is alert and oriented to person,only. Slight loss of nasal fold to right side of his face. Doesn't follow commands consistently. Moves all extremities Skin: Skin is warm and dry. No rash noted. No erythema. Has blood blister to 5th digit of right hand. No appreciable stigmata of endocarditis Psychiatric: He has a normal mood and affect. His behavior is normal.    LABS: Results for orders placed or performed during the hospital encounter of 07/25/17 (from the past 48 hour(s))  Creatinine, serum     Status: None   Collection Time: 07/25/17  9:27 PM  Result Value Ref Range   Creatinine, Ser 0.80 0.61 - 1.24 mg/dL   GFR calc non Af Amer >60 >60 mL/min   GFR calc Af Amer >60 >60 mL/min    Comment: (NOTE) The eGFR has been calculated using the CKD EPI equation. This calculation has not been validated in all clinical situations. eGFR's persistently <60 mL/min signify possible Chronic Kidney Disease.   CBC     Status: Abnormal   Collection Time: 07/26/17  3:40 AM  Result Value Ref Range   WBC 12.5 (H) 4.0 - 10.5 K/uL   RBC 3.85 (L) 4.22 - 5.81 MIL/uL   Hemoglobin 12.1 (L) 13.0 - 17.0 g/dL   HCT 36.4 (L) 39.0 - 52.0 %   MCV 94.5 78.0 - 100.0 fL   MCH 31.4 26.0 - 34.0 pg   MCHC 33.2 30.0 - 36.0 g/dL   RDW 14.0 11.5 - 15.5 %   Platelets 152 150 - 400 K/uL  Comprehensive metabolic panel     Status: Abnormal    Collection Time: 07/26/17  3:40 AM  Result Value Ref Range   Sodium 132 (L) 135 - 145 mmol/L   Potassium 4.1 3.5 - 5.1 mmol/L   Chloride 94 (L) 101 - 111 mmol/L   CO2 30 22 - 32 mmol/L   Glucose, Bld 221 (H) 65 - 99 mg/dL   BUN 28 (H) 6 - 20 mg/dL   Creatinine, Ser 0.73 0.61 - 1.24 mg/dL   Calcium 8.6 (L) 8.9 - 10.3 mg/dL   Total Protein 7.1 6.5 - 8.1 g/dL   Albumin 2.2 (L) 3.5 - 5.0 g/dL   AST 46 (H) 15 - 41 U/L   ALT 58 17 - 63 U/L   Alkaline Phosphatase 245 (H) 38 - 126 U/L   Total Bilirubin 1.7 (H) 0.3 - 1.2 mg/dL   GFR calc non Af Amer >60 >60 mL/min   GFR calc Af Amer >  60 >60 mL/min    Comment: (NOTE) The eGFR has been calculated using the CKD EPI equation. This calculation has not been validated in all clinical situations. eGFR's persistently <60 mL/min signify possible Chronic Kidney Disease.    Anion gap 8 5 - 15  Brain natriuretic peptide     Status: Abnormal   Collection Time: 07/26/17  3:40 AM  Result Value Ref Range   B Natriuretic Peptide 104.6 (H) 0.0 - 100.0 pg/mL  Glucose, capillary     Status: Abnormal   Collection Time: 07/26/17  3:53 AM  Result Value Ref Range   Glucose-Capillary 210 (H) 65 - 99 mg/dL   Comment 1 Notify RN   Glucose, capillary     Status: Abnormal   Collection Time: 07/26/17  7:35 AM  Result Value Ref Range   Glucose-Capillary 201 (H) 65 - 99 mg/dL    MICRO: 12/5 blood cx - pending  IMAGING: Ct Head Wo Contrast  Result Date: 07/26/2017 CLINICAL DATA:  56 year old male with focal neurological deficit. Concern for stroke. Left facial droop. EXAM: CT HEAD WITHOUT CONTRAST TECHNIQUE: Contiguous axial images were obtained from the base of the skull through the vertex without intravenous contrast. COMPARISON:  None. FINDINGS: Brain: The ventricles and sulci appropriate size for patient's age. There is no acute intracranial hemorrhage. No mass effect or midline shift noted. No extra-axial fluid collection. Vascular: No hyperdense vessel or  unexpected calcification. Skull: Normal. Negative for fracture or focal lesion. Sinuses/Orbits: Mild mucoperiosteal thickening of paranasal sinuses. No air-fluid levels. The mastoid air cells are clear. Other: None IMPRESSION: Unremarkable noncontrast CT of the brain. No acute intracranial hemorrhage. Electronically Signed   By: Anner Crete M.D.   On: 07/26/2017 01:40   Dg Chest Port 1 View  Result Date: 07/25/2017 CLINICAL DATA:  56 y/o  M; sepsis. EXAM: PORTABLE CHEST 1 VIEW COMPARISON:  07/24/2017 chest radiograph. FINDINGS: Stable cardiac silhouette given projection and technique. Aortic atherosclerosis with calcification. Increase hazy opacification of the lungs and reticular markings. Probable small left effusion. Fluid tracking along the right minor fissure. Bones are unremarkable. IMPRESSION: Increasing pulmonary edema and probable small effusions greater on the left. Underlying pneumonia not excluded. Electronically Signed   By: Kristine Garbe M.D.   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 Recommend to get mri of thoracic and lumbar spine -given his smoking hx, may have atherosclerosis and would also need other imaging if thought to be a surgical candidate  Altered mental status = likely multifactorial with hx of alcohol withdrawal and infection  AAA of 4.5 cm = noted from outside hospital reports. Will need follow up imaging in 12 months  Health maintenance = will check hep c ab

## 2017-07-26 NOTE — Consult Note (Signed)
Cardiology Consultation:   Patient ID: Reginald [name]; [id number]; February 01, 1961   Admit date: 07/25/2017 Date of Consult: 07/26/2017  Primary Care Provider: Patient, No Pcp Per Primary Cardiologist: New- Reginald [name]   Patient Profile:   Reginald [name] is a 56 y.o. male with a hx of diastolic CHF, AAA, COPD, DM type 2, tobacco use and alcohol use who is being seen today for the evaluation of CHF at the request of Reginald [name].  History of Present Illness:   Reginald [name] presented to [facility] on 07/20/17 for generalized weakness, low grade fever, vomiting and diffuse muscle aches. He was noted to be hypoxic. Blood cultures grew MRSA and he is being treated for MRSA bacteremia. He went through alcohol withdrawal requiring ICU care and Precedex drip 12/1-12/3. Repeat blood cultures were noted to be positive for MRSA despite antibiotics. Infectious disease was consulted at [facility] and the patient was transferred to [facility] on 07/25/17 for further treatment. There was a finding of 4.5 cm ascending aortic aneurysm on CTA of the chest. 2D echo on 07/21/17 showed normal EF and vegetation on the anterior leaflet of the mitral valve with moderate posteriorly directed mitral regurgitation.   The patient developed acute hypoxic respiratory failure following the initial fluid resuscitation. He was placed on BiPap and given IV lasix with improvement. Currently he has improved an is on nasal cannula. He is able to provide limited information. He denies chest pain. Much of the information is obtained from his wife and daughter. They say that the patient has not had any previous cardiac issues, heart attack or fluid overload although he was on a fluid pill from his PCP. The patient was quite active working odd jobs, doing Biomedical scientist and building houses. He had no known exertional symptoms recently. He began to feel generalized aching and was vomiting that lead him to seek medical attention. When  asked about recent wounds his wife states that he had multiple "boils" that popped up at various locations over the last year, the worst was near his groin. They were "doctoring" these wounds themselves.   The patient has been a long time smoker, up to a PPD since his teens. He drinks about 2 Vodka and sprite zero drinks every night. He has no known family history of heart disease.   Chest xray done yesterday showed increasing pulmonary edema and probable small effusions greater on the left. Underlying pneumonia not excluded.    Past Medical History:  Diagnosis Date  . Alcohol abuse   . Diabetes mellitus type 2 in obese (HCC)   . Neck pain   . Tobacco abuse     Past Surgical History:  Procedure Laterality Date  . HERNIA REPAIR  2007  . Tonsillectomy /adnoidectomy     as aa child     Home Medications:  Prior to Admission medications   Medication Sig Start Date End Date Taking? Authorizing Provider  amLODipine (NORVASC) 10 MG tablet Take 10 mg by mouth daily. 07/10/17  Yes [provider]  diazepam (VALIUM) 2 MG tablet Take 2 mg by mouth 2 (two) times daily. 05/24/17  Yes [provider]  gabapentin (NEURONTIN) 300 MG capsule Take 300 mg by mouth 2 (two) times daily. 07/18/17  Yes [provider]  glipiZIDE (GLUCOTROL) 10 MG tablet Take 10 mg by mouth daily. 07/08/17  Yes [provider]  hydrochlorothiazide (HYDRODIURIL) 25 MG tablet Take 25 mg by mouth daily. 06/09/17  Yes [provider]  lisinopril (PRINIVIL,ZESTRIL)  20 MG tablet Take 20 mg by mouth daily. 07/08/17  Yes [provider]  metFORMIN (GLUCOPHAGE) 500 MG tablet Take 500 mg by mouth 2 (two) times daily. 06/09/17  Yes [provider]  naproxen sodium (ALEVE) 220 MG tablet Take 220-440 mg by mouth 2 (two) times daily as needed (pain).   Yes [provider]  sildenafil (REVATIO) 20 MG tablet Take 50 mg by mouth as directed. Take 50mg  by mouth 1 hour  before intercourse 05/17/17  Yes [provider]    Inpatient Medications: Scheduled Meds: . enoxaparin (LOVENOX) injection  60 mg Subcutaneous Q24H  . folic acid  1 mg Oral Daily  . insulin aspart  0-15 Units Subcutaneous Q4H  . multivitamin with minerals  1 tablet Oral Daily  . nicotine  21 mg Transdermal Daily  . thiamine  100 mg Oral Daily   Or  . thiamine  100 mg Intravenous Daily   Continuous Infusions:  PRN Meds: acetaminophen **OR** acetaminophen, fentaNYL (SUBLIMAZE) injection, haloperidol lactate, hydrALAZINE, ipratropium-albuterol, LORazepam **OR** LORazepam, ondansetron **OR** ondansetron (ZOFRAN) IV  Allergies:    Allergies  Allergen Reactions  . Morphine And Related     Social History:   Social History   Socioeconomic History  . Marital status: Married    Spouse name: Not on file  . Number of children: Not on file  . Years of education: Not on file  . Highest education level: Not on file  Social Needs  . Financial resource strain: Not on file  . Food insecurity - worry: Not on file  . Food insecurity - inability: Not on file  . Transportation needs - medical: Not on file  . Transportation needs - non-medical: Not on file  Occupational History  . Not on file  Tobacco Use  . Smoking status: Current Every Day Smoker    Types: Cigarettes  . Smokeless tobacco: Never Used  Substance and Sexual Activity  . Alcohol use: Yes    Alcohol/week: 2.4 - 3.0 oz    Types: 4 - 5 Standard drinks or equivalent per week  . Drug use: No  . Sexual activity: Not Currently    Partners: Female  Other Topics Concern  . Not on file  Social History Narrative  . Not on file    Family History:    Family History  Problem Relation Age of Onset  . Hypertension Mother   . Hypertension Father      ROS:  Please see the history of present illness.  ROS  All other ROS reviewed and negative.     Physical Exam/Data:   Vitals:   07/26/17 0411 07/26/17 0442  07/26/17 0600 07/26/17 0800  BP:    (!) 163/80  Pulse:   94 92  Resp:    (!) 25  Temp: 98.4 F (36.9 C)   99.3 F (37.4 C)  TempSrc: Oral   Oral  SpO2:    100%  Weight:  269 lb 2.9 oz (122.1 kg)    Height:        Intake/Output Summary (Last 24 hours) at 07/26/2017 0902 Last data filed at 07/26/2017 0440 Gross per 24 hour  Intake -  Output 1800 ml  Net -1800 ml   Filed Weights   07/25/17 1815 07/26/17 0442  Weight: 264 lb 15.9 oz (120.2 kg) 269 lb 2.9 oz (122.1 kg)   Body mass index is 39.75 kg/m.  General:  Obese male, fidgeting in bed, in no acute distress HEENT: normal Lymph:  no adenopathy Neck: no JVD- difficult to assess with large neck Vascular: No carotid bruits; FA pulses 2+ bilaterally without bruits  Cardiac:  normal S1, S2; RRR; no murmur  Lungs:  clear to auscultation bilaterally, no wheezing, rhonchi or rales  Abd: soft, rounded Ext: no edema Musculoskeletal:  No deformities, BUE and BLE strength normal and equal Skin: warm and dry, purple spot on left hand Neuro:  CNs 2-12 intact, no focal abnormalities noted Psych:  Normal affect   EKG:  The EKG was personally reviewed and demonstrates:  Normal sinus rhythm, 91 bpm Poss q wave in V1-2 Telemetry:  Telemetry was personally reviewed and demonstrates:  NSR 90 bpm  Relevant CV Studies:  Echocardiogram 07/21/17 Mild concentric LVH with normal systolic function, EF ~60%. Diastolic filling pattern suggests increased left atrial pressure. LAE. Normal aortic valve. Normal right heart structure.  Echodensity c/w vegetation is noted on the anterior leaflet of the mitral valve. Moderate, posteriorly directed mitral regurgitation. No pericardial effusion.   Laboratory Data:  Chemistry Recent Labs  Lab 07/25/17 2127 07/26/17 0340  NA  --  132*  K  --  4.1  CL  --  94*  CO2  --  30  GLUCOSE  --  221*  BUN  --  28*  CREATININE 0.80 0.73  CALCIUM  --  8.6*  GFRNONAA >60 >60  GFRAA >60 >60  ANIONGAP  --  8      Recent Labs  Lab 07/26/17 0340  PROT 7.1  ALBUMIN 2.2*  AST 46*  ALT 58  ALKPHOS 245*  BILITOT 1.7*   Hematology Recent Labs  Lab 07/26/17 0340  WBC 12.5*  RBC 3.85*  HGB 12.1*  HCT 36.4*  MCV 94.5  MCH 31.4  MCHC 33.2  RDW 14.0  PLT 152   Cardiac EnzymesNo results for input(s): TROPONINI in the last 168 hours. No results for input(s): TROPIPOC in the last 168 hours.  BNP Recent Labs  Lab 07/26/17 0340  BNP 104.6*    DDimer No results for input(s): DDIMER in the last 168 hours.  Radiology/Studies:  Ct Head Wo Contrast  Result Date: 07/26/2017 CLINICAL DATA:  56 year old male with focal neurological deficit. Concern for stroke. Left facial droop. EXAM: CT HEAD WITHOUT CONTRAST TECHNIQUE: Contiguous axial images were obtained from the base of the skull through the vertex without intravenous contrast. COMPARISON:  None. FINDINGS: Brain: The ventricles and sulci appropriate size for patient's age. There is no acute intracranial hemorrhage. No mass effect or midline shift noted. No extra-axial fluid collection. Vascular: No hyperdense vessel or unexpected calcification. Skull: Normal. Negative for fracture or focal lesion. Sinuses/Orbits: Mild mucoperiosteal thickening of paranasal sinuses. No air-fluid levels. The mastoid air cells are clear. Other: None IMPRESSION: Unremarkable noncontrast CT of the brain. No acute intracranial hemorrhage. Electronically Signed   By: Elgie Collard M.D.   On: 07/26/2017 01:40   Dg Chest Port 1 View  Result Date: 07/25/2017 CLINICAL DATA:  56 y/o  M; sepsis. EXAM: PORTABLE CHEST 1 VIEW COMPARISON:  07/24/2017 chest radiograph. FINDINGS: Stable cardiac silhouette given projection and technique. Aortic atherosclerosis with calcification. Increase hazy opacification of the lungs and reticular markings. Probable small left effusion. Fluid tracking along the right minor fissure. Bones are unremarkable. IMPRESSION: Increasing pulmonary edema and  probable small effusions greater on the left. Underlying pneumonia not excluded. Electronically Signed   By: Mitzi Hansen M.D.   On: 07/25/2017 22:07    Assessment and Plan:   Diastolic CHF -  Chest xray yest showed increasing pulmonary edema. Received dose of IV lasix yesterday. -EF normal by echo on 11/30, but there was evidence of diastolic dysfunction -Currently breathing is stable. Lungs are clear and no significant edema. -Continue mild diuresis. Renal function is stable.  -Will consider adding low dose carvedilol and resuming his lisinopril. Heart rate is around 90 and BP is mildly elevated  MRSA bacteremia -With mitral valve vegetation -Pt noted to have persistent positive blood cultures despite IV Vancomycin -Infectious disease service following with plan for MRI of the thoracic and lumbar spine with contrast -Plan for cardiothoracic surgery consult -Will arrange for a TEE as requested by ID.  Hypoxic respiratory failure -Using oxygen by nasal cannula and CPAP at HS and prn for OSA -Chest xray done yesterday showed increasing pulmonary edema and probable small effusions greater on the left. Underlying pneumonia not excluded.  -Management per primary service  Hypertension -Home meds include amlodipine 10 mg, lisinopril 20 mg, hydrochlorothiazide 25 mg.  -On no antihypertensives in setting of acute illness. Blood pressures now running in the 140's-170's/70's-80's -Would add low dose carvedilol and ACE-I  Acute encephalopathy -Pt had acute [facility] withdrawal [facility]. Still with altered MS. Overnight pt noted to have acute change with intermittent left sided weakness and facial droop.  -Non contrast CT of the head was unremarkable. Workup by IM. Plan for MRI of the brain.   AAA -Noted to have 4.5 cm ascending aortic aneurysm, recommendation for semiannual imaging  Tobacco use -nicotine patch on  Thrombocytopenia -Platelets were 128 thought to  be related to history of alcohol abuse.   Today normal at 152.  Diabetes -Onknown A1c -Management per primary   For questions or updates, please contact [facility] Please consult [email or web address] for contact info under Cardiology/STEMI.   Signed, Berton BonJanine Gowri Suchan, NP  07/26/2017 9:02 AM

## 2017-07-26 NOTE — Progress Notes (Addendum)
Pharmacy Antibiotic Note  Reginald [name] is a 56 y.o. 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  Pharmacy has been consulted for vancomycin dosing.  Patient on vancomycin prior to [facility].    [facility] reviewed, unclear exactly when vancomycin was started and what dose was initiated but a level was drawn on 12/1 which was subtherapeutic at 8.7 mcg/ml.  Appears dose was perhaps modified to 2g IV q12h and another level was obtained on 12/3 which was in therapeutic range at 15.8 mcg/ml.  Per RN report last night, a dose of vancomycin 2g was infusing, therefore a random vancomycin level was obtained today ~12:00 which should be approximately 12 hrs post dose is therapeutic at 15 mcg/ml.  RN last night also documented a possible Red Man's reaction to vancomycin infusion.  Plan: Will dose vancomycin for goal AUC = 400 - 500  Vancomycin 1500mg  IV q12h for estimated AUC 520 Check vancomycin peak and trough at steady state to calculate AUC Will prolong infusion rate due to possible Red-Man's syndrome - can premedicate with benadryl if necessary Follow up renal function, repeat cultures   Height: 5\' 9"  (175.3 cm) Weight: 269 lb 2.9 oz (122.1 kg) IBW/kg (Calculated) : 70.7  Temp (24hrs), Avg:98.9 F (37.2 C), Min:97.7 F (36.5 C), Max:100.3 F (37.9 C)  Recent Labs  Lab 07/25/17 2127 07/26/17 0340 07/26/17 1158  WBC  --  12.5*  --   CREATININE 0.80 0.73  --   VANCOTROUGH  --   --  15    Estimated Creatinine Clearance: 131.6 mL/min (by C-G formula based on SCr of 0.73 mg/dL).    Allergies  Allergen Reactions  . Morphine And Related     Antimicrobials this admission:  12/4 Vanc (likely [facility]) >>  Dose adjustments this admission:  Labs from [facility]: 12/1 VT at 03:09 = 8.7 12/3 VT at 03:11 = 15.8 [facility], was on Vanc 2g q12h starting 12/2.   Per admitting RN, 2g dose was hanging on transfer. 12/5  VRm at 12:00 = 6015mcg/ml  Microbiology results:  11[facility]29[facility]: MRSA per report 12/4 BCx:   Thank you for allowing pharmacy to be a part of this patient's care.  Loralee PacasErin Ameriah Lint, PharmD, BCPS Pager: [phone] 07/26/2017 12:55 PM

## 2017-07-26 NOTE — Progress Notes (Addendum)
PROGRESS NOTE    Reginald [name]  Reginald [name] DOB: 04/12/1961 DOA: 07/25/2017 PCP: Patient, No Pcp Per   Brief Narrative:  Reginald [name] is Reginald [name] 56 y.o. male with medical history significant of diastolic CHF, AAA, COPD, DM type II, tobacco abuse, and alcohol abuse; who presents as Reginald [name] transfer from [facility] for MRSA bacteremia.  Patient was hospitalized from 11/29 -12/4, and had initially presented with nonspecific complaints of generalized weakness, low-grade fever, vomiting, and diffuse muscle aches. In the emergency department patient was noted to be hypoxic requiring oxygen which he was not on at baseline.  Patient was found to be negative for influenza and had negative CT after noting steroid injection to the neck on 11/28.  Blood cultures had been obtained on admission and noted to grow out MRSA.  Antibiotics of vancomycin were added at that time.  2D echo revealed signs of an anterior mitral valve vegetation and EF was noted to be around 60%.  They have been administering IV Lasix to help diurese the patient as he appeared to x-rays initially showed signs of pulmonary edema.  He went into significant alcohol withdrawals requiring transfer to the ICU and initiation of Precedex drip on 12/1-2, but gtt was able to be weaned off on 12/3.  Repeat blood cultures on 12/1 and 12/3 were noted to be positive for MRSA, despite antibiotics.  Urine cultures did not grow out any specific bacteria and he was discontinued on Rocephin.  Dr. Johnnye [name] of infectious disease was consulted at [facility] and it was recommended to transfer the patient.  It appears during his hospital stay patient was also noted to Reginald [name] 4.5 cm ascending aortic aneurysm and MRI of the cervical spine showed degenerative changes without discitis or fractures.  Assessment & Plan:   Principal Problem:   MRSA bacteremia Active Problems:   Mitral valve vegetation   Acute respiratory failure with hypoxia (HCC)   Alcohol abuse  Thrombocytopenia (HCC)   Abnormal liver enzymes   Acute encephalopathy   Acute diastolic CHF (congestive heart failure) (HCC)   MRSA bacteremia with mitral valve vegetation: Acute.  Patient noted to have persistent positive blood cultures despite IV antibiotics of vancomycin. - Repeat bcx pending - Continue vancomycin as tolerated - Dr. Graylon [name] of infectious disease consulted, will follow up further recommendations - Check MRI of the thoracic and lumbar spine with contrast per ID (attempted today, but unable due to AMS) - follow up TEE (looks like plan for Friday at [facility]) - Cardiothoracic surgery c/s placed (discussed with nurse working with Dr. Roxan [name])  Acute encephalopathy:  Pt Reginald [name]&Ox1.  MRI this morning with evidence of acute infarction from emboli.  His AMS is likely due to septic emboli with contribution from sepsis.   Initially thought related to alcohol and infection.  Overnight patient noted to have acute change with intermittant left-sided weakness and facial droop, pt was intermittently cooperative on my exam today, but seemed to have symmetric 3/5 strength. - Neurochecks every 4 hours - Check MRI of the brain without contrast, as above - Speech therapy consult - haldol prn agitation  Hypoxic respiratory failure, OSA: Acute.  Patient still on nasal cannula oxygen maintaining O2 saturations.  Physical exam reveals positive crackles, CXR with pulmonary edema.   - Continuous pulse oximetry with nasal cannula oxygen as needed - CPAP qhs and prn - Check portable chest x-ray - notable for increasing pulmonary edema with small effusions (L>R) '[ ]'$  continue lasix daily (s/p 40 mg this AM)  Diastolic CHF exacerbation: Last echocardiogram performed on 11/30 showing mitral valve vegetation on the anterior leaflet and EF of approximately 60%. - Strict I&O's and daily weights - Lasix 40 mg IV x1 dose this morning, will continue as above - Message sent for cardiology to  see in  Reginald [name].  Neck pain/arthralgia: Chronic.  Patient evaluated with MRI of the cervical spine which showed only degenerative changes. - May want to follow-up with [facility] regarding CRP, ESR, RA, and CCP antibodies (will need to f/u records in chart). - Fentanyl IV prn pain overnight  Diabetes mellitus type 2, uncontrolled.  Patient only on oral medications of metformin and glipizide at home but blood sugars noted to be in the 300s. - Hypoglycemic protocol - Hold metformin and glipizide - CBGs every 4 hours with moderate SSI  Alcohol abuse: Patient had been noted to go into alcohol withdrawals requiring patient to be placed on Precedex drip, but this was discontinuedon 12/3 prior to transfer. - CWIA protocols without schedule Ativan  Thrombocytopenia: Platelet count noted to be 128.  Suspected to be related to patient's history of alcohol abuse.  Patient had right upper quadrant ultrasound noting signs of cirrhosis all at the outside facility. - Repeat CBC  Abnormal liver enzymes: Patient had been noted to have improving liver enzymes.  [place] at outside hospital as above, will need to f/u formal report. - Check CMP in Reginald [name].m.  AAA: Stable patient noted to have Reginald [name] 4.5 ascending aortic aneurysm.   - Patient will need semiannual imaging with CTA or MRA imaging  Tobacco abuse - Nicotine patch - Will need to counsel need of cessation of tobacco  Obesity: BMI 39   DVT prophylaxis: lovenox Code Status: full  Family Communication: mother and daughter at bedside Disposition Plan: likely to [facility] for CT surgery eval and TEE, pending w/u   Consultants:   CT surgery  Cardiology  Infectious disease  Procedures: (Don't include imaging studies which can be auto populated. Include things that cannot be auto populated i.e. Echo, Carotid and venous dopplers, Foley, Bipap, HD, tubes/drains, wound vac, central lines etc)  Echo and RUQ [place] at [place] (see chart)  Antimicrobials: (specify start and  planned stop date. Auto populated tables are space occupying and do not give end dates) Anti-infectives (From admission, onward)   Start     Dose/Rate Route Frequency Ordered Stop   07/26/17 1400  vancomycin (VANCOCIN) 1,500 mg in sodium chloride 0.9 % 500 mL IVPB     1,500 mg 250 mL/hr over 120 Minutes Intravenous Every 12 hours 07/26/17 1254     07/25/17 2200  vancomycin (VANCOCIN) 2,500 mg in sodium chloride 0.9 % 500 mL IVPB  Status:  Discontinued     2,500 mg 250 mL/hr over 120 Minutes Intravenous  Once 07/25/17 2124 07/25/17 2210   07/25/17 2130  vancomycin (VANCOCIN) IVPB 1000 mg/200 mL premix  Status:  Discontinued     1,000 mg 200 mL/hr over 60 Minutes Intravenous  Once 07/25/17 2118 07/25/17 2123      Subjective: Jeydan Barner&Ox1 (did not know location, month, or reason for being here). Denies pain at the moment. Noted to be agitated overnight.  Pulling lines. Mental status waxing waning, was Shadow Schedler&Ox4 overnight.     Objective: Vitals:   07/26/17 0411 07/26/17 0442 07/26/17 0600 07/26/17 0800  BP:    (!) 163/80  Pulse:   94 92  Resp:    (!) 25  Temp: 98.4 F (36.9 C)   99.3 F (37.4  C)  TempSrc: Oral   Oral  SpO2:    100%  Weight:  122.1 kg (269 lb 2.9 oz)    Height:        Intake/Output Summary (Last 24 hours) at 07/26/2017 0833 Last data filed at 07/26/2017 0440 Gross per 24 hour  Intake -  Output 1800 ml  Net -1800 ml   Filed Weights   07/25/17 1815 07/26/17 0442  Weight: 120.2 kg (264 lb 15.9 oz) 122.1 kg (269 lb 2.9 oz)    Examination:  General exam: Appears calm and comfortable  Respiratory system: Clear to auscultation. Respiratory effort normal. Cardiovascular system: S1 & S2 heard, RRR. No JVD, murmurs, rubs, gallops or clicks. No pedal edema. Gastrointestinal system: Abdomen is nondistended, soft and nontender. No organomegaly or masses felt. Normal bowel sounds heard. Central nervous system: Disoriented.  Does not follow commands well, but no focal deficits  appreciated.  CN 2-12 intact, but limited with his following commands.  Extremities symmetric, but 3/5.   Extremities: Symmetric 5 x 5 power. Skin: Osler nodes to R pinky, L palm, L foot.  Psychiatry: Judgement and insight appear normal. Mood & affect appropriate.     Data Reviewed: I have personally reviewed following labs and imaging studies  CBC: Recent Labs  Lab 07/26/17 0340  WBC 12.5*  HGB 12.1*  HCT 36.4*  MCV 94.5  PLT 810   Basic Metabolic Panel: Recent Labs  Lab 07/25/17 2127 07/26/17 0340  NA  --  132*  K  --  4.1  CL  --  94*  CO2  --  30  GLUCOSE  --  221*  BUN  --  28*  CREATININE 0.80 0.73  CALCIUM  --  8.6*   GFR: Estimated Creatinine Clearance: 131.6 mL/min (by C-G formula based on SCr of 0.73 mg/dL). Liver Function Tests: Recent Labs  Lab 07/26/17 0340  AST 46*  ALT 58  ALKPHOS 245*  BILITOT 1.7*  PROT 7.1  ALBUMIN 2.2*   No results for input(s): LIPASE, AMYLASE in the last 168 hours. No results for input(s): AMMONIA in the last 168 hours. Coagulation Profile: No results for input(s): INR, PROTIME in the last 168 hours. Cardiac Enzymes: No results for input(s): CKTOTAL, CKMB, CKMBINDEX, TROPONINI in the last 168 hours. BNP (last 3 results) No results for input(s): PROBNP in the last 8760 hours. HbA1C: No results for input(s): HGBA1C in the last 72 hours. CBG: Recent Labs  Lab 07/26/17 0353 07/26/17 0735  GLUCAP 210* 201*   Lipid Profile: No results for input(s): CHOL, HDL, LDLCALC, TRIG, CHOLHDL, LDLDIRECT in the last 72 hours. Thyroid Function Tests: No results for input(s): TSH, T4TOTAL, FREET4, T3FREE, THYROIDAB in the last 72 hours. Anemia Panel: No results for input(s): VITAMINB12, FOLATE, FERRITIN, TIBC, IRON, RETICCTPCT in the last 72 hours. Sepsis Labs: No results for input(s): PROCALCITON, LATICACIDVEN in the last 168 hours.  No results found for this or any previous visit (from the past 240 hour(s)).        Radiology Studies: Ct Head Wo Contrast  Result Date: 07/26/2017 CLINICAL DATA:  56 year old male with focal neurological deficit. Concern for stroke. Left facial droop. EXAM: CT HEAD WITHOUT CONTRAST TECHNIQUE: Contiguous axial images were obtained from the base of the skull through the vertex without intravenous contrast. COMPARISON:  None. FINDINGS: Brain: The ventricles and sulci appropriate size for patient's age. There is no acute intracranial hemorrhage. No mass effect or midline shift noted. No extra-axial fluid collection. Vascular: No hyperdense  vessel or unexpected calcification. Skull: Normal. Negative for fracture or focal lesion. Sinuses/Orbits: Mild mucoperiosteal thickening of paranasal sinuses. No air-fluid levels. The mastoid air cells are clear. Other: None IMPRESSION: Unremarkable noncontrast CT of the brain. No acute intracranial hemorrhage. Electronically Signed   By: Anner Crete M.D.   On: 07/26/2017 01:40   Dg Chest Port 1 View  Result Date: 07/25/2017 CLINICAL DATA:  56 y/o  M; sepsis. EXAM: PORTABLE CHEST 1 VIEW COMPARISON:  07/24/2017 chest radiograph. FINDINGS: Stable cardiac silhouette given projection and technique. Aortic atherosclerosis with calcification. Increase hazy opacification of the lungs and reticular markings. Probable small left effusion. Fluid tracking along the right minor fissure. Bones are unremarkable. IMPRESSION: Increasing pulmonary edema and probable small effusions greater on the left. Underlying pneumonia not excluded. Electronically Signed   By: Kristine Garbe M.D.   On: 07/25/2017 22:07        Scheduled Meds: . enoxaparin (LOVENOX) injection  60 mg Subcutaneous Q24H  . folic acid  1 mg Oral Daily  . insulin aspart  0-15 Units Subcutaneous Q4H  . multivitamin with minerals  1 tablet Oral Daily  . nicotine  21 mg Transdermal Daily  . thiamine  100 mg Oral Daily   Or  . thiamine  100 mg Intravenous Daily    Continuous Infusions:   LOS: 1 day    Time spent: over 30 minutes    Fayrene Helper, MD [facility] Pager [phone]  If 7PM-7AM, please contact night-coverage [email or web address] Password [facility] 07/26/2017, 8:33 AM

## 2017-07-27 ENCOUNTER — Inpatient Hospital Stay (HOSPITAL_COMMUNITY): Payer: Medicaid Other

## 2017-07-27 DIAGNOSIS — I340 Nonrheumatic mitral (valve) insufficiency: Secondary | ICD-10-CM

## 2017-07-27 DIAGNOSIS — J069 Acute upper respiratory infection, unspecified: Secondary | ICD-10-CM

## 2017-07-27 DIAGNOSIS — J9601 Acute respiratory failure with hypoxia: Secondary | ICD-10-CM

## 2017-07-27 DIAGNOSIS — I339 Acute and subacute endocarditis, unspecified: Secondary | ICD-10-CM

## 2017-07-27 DIAGNOSIS — I058 Other rheumatic mitral valve diseases: Secondary | ICD-10-CM

## 2017-07-27 DIAGNOSIS — I669 Occlusion and stenosis of unspecified cerebral artery: Secondary | ICD-10-CM

## 2017-07-27 DIAGNOSIS — B9562 Methicillin resistant Staphylococcus aureus infection as the cause of diseases classified elsewhere: Secondary | ICD-10-CM

## 2017-07-27 DIAGNOSIS — I76 Septic arterial embolism: Secondary | ICD-10-CM

## 2017-07-27 DIAGNOSIS — R7881 Bacteremia: Secondary | ICD-10-CM

## 2017-07-27 DIAGNOSIS — G9340 Encephalopathy, unspecified: Secondary | ICD-10-CM

## 2017-07-27 LAB — BLOOD GAS, ARTERIAL
ACID-BASE EXCESS: 5.9 mmol/L — AB (ref 0.0–2.0)
ACID-BASE EXCESS: 6.2 mmol/L — AB (ref 0.0–2.0)
ACID-BASE EXCESS: 7.9 mmol/L — AB (ref 0.0–2.0)
BICARBONATE: 32.2 mmol/L — AB (ref 20.0–28.0)
Bicarbonate: 31.4 mmol/L — ABNORMAL HIGH (ref 20.0–28.0)
Bicarbonate: 32.3 mmol/L — ABNORMAL HIGH (ref 20.0–28.0)
DRAWN BY: 295031
Drawn by: 11249
Drawn by: 295031
FIO2: 100
FIO2: 100
LHR: 16 {breaths}/min
MECHVT: 500 mL
O2 Content: 4 L/min
O2 SAT: 92.3 %
O2 Saturation: 98.6 %
O2 Saturation: 99.6 %
PATIENT TEMPERATURE: 98.6
PCO2 ART: 58.3 mmHg — AB (ref 32.0–48.0)
PCO2 ART: 53.5 mmHg — AB (ref 32.0–48.0)
PEEP/CPAP: 10 cmH2O
PH ART: 7.363 (ref 7.350–7.450)
PH ART: 7.505 — AB (ref 7.350–7.450)
Patient temperature: 98.6
Patient temperature: 98.9
pCO2 arterial: 40.2 mmHg (ref 32.0–48.0)
pH, Arterial: 7.397 (ref 7.350–7.450)
pO2, Arterial: 146 mmHg — ABNORMAL HIGH (ref 83.0–108.0)
pO2, Arterial: 332 mmHg — ABNORMAL HIGH (ref 83.0–108.0)
pO2, Arterial: 64.3 mmHg — ABNORMAL LOW (ref 83.0–108.0)

## 2017-07-27 LAB — CBC
HEMATOCRIT: 34.1 % — AB (ref 39.0–52.0)
Hemoglobin: 11.4 g/dL — ABNORMAL LOW (ref 13.0–17.0)
MCH: 31.2 pg (ref 26.0–34.0)
MCHC: 33.4 g/dL (ref 30.0–36.0)
MCV: 93.4 fL (ref 78.0–100.0)
Platelets: 156 10*3/uL (ref 150–400)
RBC: 3.65 MIL/uL — ABNORMAL LOW (ref 4.22–5.81)
RDW: 13.9 % (ref 11.5–15.5)
WBC: 13 10*3/uL — ABNORMAL HIGH (ref 4.0–10.5)

## 2017-07-27 LAB — GLUCOSE, CAPILLARY
GLUCOSE-CAPILLARY: 209 mg/dL — AB (ref 65–99)
GLUCOSE-CAPILLARY: 184 mg/dL — AB (ref 65–99)
GLUCOSE-CAPILLARY: 160 mg/dL — AB (ref 65–99)
Glucose-Capillary: 162 mg/dL — ABNORMAL HIGH (ref 65–99)
Glucose-Capillary: 150 mg/dL — ABNORMAL HIGH (ref 65–99)
Glucose-Capillary: 169 mg/dL — ABNORMAL HIGH (ref 65–99)

## 2017-07-27 LAB — COMPREHENSIVE METABOLIC PANEL
ALBUMIN: 2 g/dL — AB (ref 3.5–5.0)
ALT: 50 U/L (ref 17–63)
ANION GAP: 6 (ref 5–15)
AST: 40 U/L (ref 15–41)
Alkaline Phosphatase: 217 U/L — ABNORMAL HIGH (ref 38–126)
BILIRUBIN TOTAL: 2 mg/dL — AB (ref 0.3–1.2)
BUN: 21 mg/dL — ABNORMAL HIGH (ref 6–20)
CHLORIDE: 98 mmol/L — AB (ref 101–111)
CO2: 30 mmol/L (ref 22–32)
Calcium: 8.4 mg/dL — ABNORMAL LOW (ref 8.9–10.3)
Creatinine, Ser: 0.61 mg/dL (ref 0.61–1.24)
GFR calc Af Amer: >60 mL/min (ref >60)
GFR calc non Af Amer: >60 mL/min (ref >60)
GLUCOSE: 200 mg/dL — AB (ref 65–99)
POTASSIUM: 4.2 mmol/L (ref 3.5–5.1)
SODIUM: 134 mmol/L — AB (ref 135–145)
TOTAL PROTEIN: 6.7 g/dL (ref 6.5–8.1)

## 2017-07-27 LAB — TRIGLYCERIDES: Triglycerides: 176 mg/dL — ABNORMAL HIGH (ref <150)

## 2017-07-27 LAB — PHOSPHORUS
Phosphorus: 5.1 mg/dL — ABNORMAL HIGH (ref 2.5–4.6)
Phosphorus: 5.3 mg/dL — ABNORMAL HIGH (ref 2.5–4.6)

## 2017-07-27 LAB — MAGNESIUM
Magnesium: 2 mg/dL (ref 1.7–2.4)
Magnesium: 1.9 mg/dL (ref 1.7–2.4)

## 2017-07-27 LAB — MRSA PCR SCREENING: MRSA BY PCR: POSITIVE — AB

## 2017-07-27 MED ORDER — FUROSEMIDE 10 MG/ML IJ SOLN
40.0000 mg | Freq: Two times a day (BID) | INTRAMUSCULAR | Status: DC
  Administered 2017-07-27 – 2017-07-29 (×6): 40 mg via INTRAVENOUS
  Filled 2017-07-27 (×6): qty 4

## 2017-07-27 MED ORDER — INSULIN ASPART 100 UNIT/ML ~~LOC~~ SOLN
3.0000 [IU] | SUBCUTANEOUS | Status: DC
  Administered 2017-07-27 – 2017-07-28 (×2): 3 [IU] via SUBCUTANEOUS

## 2017-07-27 MED ORDER — CHLORHEXIDINE GLUCONATE 0.12% ORAL RINSE (MEDLINE KIT)
15.0000 mL | Freq: Two times a day (BID) | OROMUCOSAL | Status: DC
  Administered 2017-07-27 – 2017-07-31 (×8): 15 mL via OROMUCOSAL

## 2017-07-27 MED ORDER — VITAMIN B-1 100 MG PO TABS
100.0000 mg | ORAL_TABLET | Freq: Every day | ORAL | Status: DC
  Administered 2017-08-03 – 2017-08-10 (×7): 100 mg via ORAL
  Filled 2017-07-27 (×10): qty 1

## 2017-07-27 MED ORDER — THIAMINE HCL 100 MG/ML IJ SOLN
500.0000 mg | Freq: Three times a day (TID) | INTRAVENOUS | Status: AC
  Administered 2017-07-27 – 2017-07-29 (×7): 500 mg via INTRAVENOUS
  Filled 2017-07-27 (×10): qty 5

## 2017-07-27 MED ORDER — ETOMIDATE 2 MG/ML IV SOLN
30.0000 mg | Freq: Once | INTRAVENOUS | Status: AC
  Administered 2017-07-27: 30 mg via INTRAVENOUS

## 2017-07-27 MED ORDER — INSULIN GLARGINE 100 UNIT/ML ~~LOC~~ SOLN
7.0000 [IU] | Freq: Every day | SUBCUTANEOUS | Status: DC
  Administered 2017-07-27: 7 [IU] via SUBCUTANEOUS
  Filled 2017-07-27: qty 0.07

## 2017-07-27 MED ORDER — VITAL HIGH PROTEIN PO LIQD
1000.0000 mL | ORAL | Status: DC
  Filled 2017-07-27: qty 1000

## 2017-07-27 MED ORDER — LORAZEPAM 2 MG/ML IJ SOLN
2.0000 mg | INTRAMUSCULAR | Status: DC | PRN
  Administered 2017-07-27: 3 mg via INTRAVENOUS
  Administered 2017-07-27: 2 mg via INTRAVENOUS
  Filled 2017-07-27: qty 1
  Filled 2017-07-27: qty 2

## 2017-07-27 MED ORDER — MIDAZOLAM HCL 2 MG/2ML IJ SOLN
4.0000 mg | Freq: Once | INTRAMUSCULAR | Status: AC
  Administered 2017-07-27: 4 mg via INTRAVENOUS

## 2017-07-27 MED ORDER — VITAL HIGH PROTEIN PO LIQD
1000.0000 mL | ORAL | Status: DC
  Administered 2017-07-27: 1000 mL
  Administered 2017-07-27: 20 mL/h
  Administered 2017-07-28 – 2017-07-31 (×3): 1000 mL
  Filled 2017-07-27 (×6): qty 1000

## 2017-07-27 MED ORDER — CHLORHEXIDINE GLUCONATE CLOTH 2 % EX PADS
6.0000 | MEDICATED_PAD | Freq: Every day | CUTANEOUS | Status: DC
  Administered 2017-07-28 – 2017-07-30 (×3): 6 via TOPICAL

## 2017-07-27 MED ORDER — PRO-STAT SUGAR FREE PO LIQD
30.0000 mL | Freq: Every day | ORAL | Status: DC
  Administered 2017-07-28 – 2017-07-29 (×2): 30 mL
  Filled 2017-07-27 (×3): qty 30

## 2017-07-27 MED ORDER — PROPOFOL 1000 MG/100ML IV EMUL
0.0000 ug/kg/min | INTRAVENOUS | Status: DC
  Administered 2017-07-27: 40 ug/kg/min via INTRAVENOUS
  Administered 2017-07-27: 50 ug/kg/min via INTRAVENOUS
  Administered 2017-07-27: 5 ug/kg/min via INTRAVENOUS
  Administered 2017-07-27 – 2017-07-30 (×18): 50 ug/kg/min via INTRAVENOUS
  Administered 2017-07-30: 30 ug/kg/min via INTRAVENOUS
  Administered 2017-07-30: 40 ug/kg/min via INTRAVENOUS
  Administered 2017-07-30 – 2017-07-31 (×4): 50 ug/kg/min via INTRAVENOUS
  Filled 2017-07-27 (×8): qty 100
  Filled 2017-07-27 (×2): qty 200
  Filled 2017-07-27 (×3): qty 100
  Filled 2017-07-27 (×2): qty 200
  Filled 2017-07-27 (×10): qty 100

## 2017-07-27 MED ORDER — ORAL CARE MOUTH RINSE
15.0000 mL | Freq: Four times a day (QID) | OROMUCOSAL | Status: DC
  Administered 2017-07-28 – 2017-07-31 (×14): 15 mL via OROMUCOSAL

## 2017-07-27 MED ORDER — DEXMEDETOMIDINE HCL IN NACL 200 MCG/50ML IV SOLN
INTRAVENOUS | Status: AC
  Filled 2017-07-27: qty 50

## 2017-07-27 MED ORDER — FOLIC ACID 5 MG/ML IJ SOLN
1.0000 mg | Freq: Every day | INTRAMUSCULAR | Status: DC
  Administered 2017-07-27 – 2017-08-08 (×12): 1 mg via INTRAVENOUS
  Filled 2017-07-27 (×16): qty 0.2

## 2017-07-27 MED ORDER — MIDAZOLAM HCL 2 MG/2ML IJ SOLN
INTRAMUSCULAR | Status: AC
Start: 2017-07-27 — End: 2017-07-27
  Filled 2017-07-27: qty 4

## 2017-07-27 MED ORDER — MUPIROCIN 2 % EX OINT
1.0000 "application " | TOPICAL_OINTMENT | Freq: Two times a day (BID) | CUTANEOUS | Status: AC
  Administered 2017-07-28 – 2017-08-01 (×10): 1 via NASAL
  Filled 2017-07-27 (×2): qty 22

## 2017-07-27 MED ORDER — PRO-STAT SUGAR FREE PO LIQD
60.0000 mL | Freq: Three times a day (TID) | ORAL | Status: DC
  Administered 2017-07-27 – 2017-07-31 (×12): 60 mL
  Filled 2017-07-27 (×12): qty 60

## 2017-07-27 MED ORDER — PRO-STAT SUGAR FREE PO LIQD: 30.0000 mL | Freq: Two times a day (BID) | ORAL | Status: DC

## 2017-07-27 MED ORDER — DEXMEDETOMIDINE HCL IN NACL 200 MCG/50ML IV SOLN
0.4000 ug/kg/h | INTRAVENOUS | Status: DC
  Administered 2017-07-27 (×2): 1.1 ug/kg/h via INTRAVENOUS
  Administered 2017-07-27: 0.9 ug/kg/h via INTRAVENOUS
  Administered 2017-07-27: 1.2 ug/kg/h via INTRAVENOUS
  Administered 2017-07-27: 1.1 ug/kg/h via INTRAVENOUS
  Administered 2017-07-27: 0.4 ug/kg/h via INTRAVENOUS
  Filled 2017-07-27 (×3): qty 50

## 2017-07-27 MED ORDER — HYDRALAZINE HCL 20 MG/ML IJ SOLN: 10.0000 mg | INTRAMUSCULAR | Status: DC | PRN

## 2017-07-27 MED ORDER — THIAMINE HCL 100 MG/ML IJ SOLN
100.0000 mg | Freq: Every day | INTRAMUSCULAR | Status: DC
  Administered 2017-07-30 – 2017-08-04 (×5): 100 mg via INTRAVENOUS
  Filled 2017-07-27 (×7): qty 2

## 2017-07-27 MED ORDER — ENOXAPARIN SODIUM 60 MG/0.6ML ~~LOC~~ SOLN
60.0000 mg | SUBCUTANEOUS | Status: DC
  Administered 2017-07-27 – 2017-07-31 (×5): 60 mg via SUBCUTANEOUS
  Filled 2017-07-27 (×6): qty 0.6

## 2017-07-27 MED ORDER — SODIUM CHLORIDE 0.9 % IV SOLN
INTRAVENOUS | Status: DC
  Administered 2017-07-27: 15:00:00 via INTRAVENOUS

## 2017-07-27 MED ORDER — INSULIN ASPART 100 UNIT/ML ~~LOC~~ SOLN
0.0000 [IU] | SUBCUTANEOUS | Status: DC
  Administered 2017-07-27: 3 [IU] via SUBCUTANEOUS
  Administered 2017-07-27 – 2017-07-28 (×3): 4 [IU] via SUBCUTANEOUS
  Administered 2017-07-28: 7 [IU] via SUBCUTANEOUS
  Administered 2017-07-28: 4 [IU] via SUBCUTANEOUS
  Administered 2017-07-28: 2 [IU] via SUBCUTANEOUS
  Administered 2017-07-28 – 2017-07-29 (×2): 4 [IU] via SUBCUTANEOUS
  Administered 2017-07-29: 7 [IU] via SUBCUTANEOUS
  Administered 2017-07-29 (×2): 4 [IU] via SUBCUTANEOUS
  Administered 2017-07-29 (×2): 3 [IU] via SUBCUTANEOUS
  Administered 2017-07-30: 4 [IU] via SUBCUTANEOUS
  Administered 2017-07-30: 3 [IU] via SUBCUTANEOUS
  Administered 2017-07-30: 4 [IU] via SUBCUTANEOUS
  Administered 2017-07-30: 3 [IU] via SUBCUTANEOUS
  Administered 2017-07-30 – 2017-07-31 (×4): 4 [IU] via SUBCUTANEOUS
  Administered 2017-07-31 (×2): 3 [IU] via SUBCUTANEOUS
  Administered 2017-07-31 (×2): 4 [IU] via SUBCUTANEOUS
  Administered 2017-08-01: 3 [IU] via SUBCUTANEOUS

## 2017-07-27 NOTE — CV Procedure (Addendum)
TRANSESOPHAGEAL ECHOCARDIOGRAM (TEE) NOTE  INDICATIONS: infective endocarditis  PROCEDURE:   Informed consent was obtained prior to the procedure. The risks, benefits and alternatives for the procedure were discussed and the patient comprehended these risks.  Risks include, but are not limited to, cough, sore throat, vomiting, nausea, somnolence, esophageal and stomach trauma or perforation, bleeding, low blood pressure, aspiration, pneumonia, infection, trauma to the teeth and death.    After a procedural time-out, the patient was on Propofol for sedation per the MICU staff. The patient was intubated and stable on the ventilator. The patient's heart rate, blood pressure, and oxygen saturation are monitored continuously during the procedure. The transesophageal probe was inserted in the esophagus and stomach without difficulty and multiple views were obtained.  At the conclusion of the procedure, the probe was removed without difficulty.  Agitated microbubble saline contrast was administered.  COMPLICATIONS:    There were no immediate complications.  Findings:  1. LEFT VENTRICLE: The left ventricular wall thickness is moderately increased.  The left ventricular cavity is normal in size. Wall motion is hyperdynamic.  LVEF is 60-65%.  2. RIGHT VENTRICLE:  The right ventricle is normal in structure and function without any thrombus or masses.    3. LEFT ATRIUM:  The left atrium is mildly dilated in size without any thrombus or masses.  There is not spontaneous echo contrast ("smoke") in the left atrium consistent with a low flow state.  4. LEFT ATRIAL APPENDAGE:  The left atrial appendage is free of any thrombus or masses. The appendage has single lobes. Pulse doppler indicates moderate flow in the appendage.  5. ATRIAL SEPTUM:  The atrial septum appears intact and is free of thrombus and/or masses.  There is no evidence for interatrial shunting by color doppler and saline  microbubble.  6. RIGHT ATRIUM:  The right atrium is normal in size and function without any thrombus or masses.  7. MITRAL VALVE:  The mitral valve was well-visualized with 2D and 3D echo. There is a large 2-3 cm vegetation of the anterior leaflet with a discrete jet of the the posterior leafelet suggestive of leaflet perforation - this was confirmed with dropout on the 3D images. There are 2 distinct MR jets and in total there is Severe regurgitation.    8. AORTIC VALVE:  The aortic valve is trileaflet with trivial regurgitation.  There was a small mass on the non-coronary cusp of the aortic valve, which may represent vegetation - there appear to be filamentous strands at the leaflet tips, c/w Lambl's excrescensces.   9. TRICUSPID VALVE:  The tricuspid valve is normal in structure and function with Mild regurgitation.  There were no vegetations or stenosis  10.  PULMONIC VALVE:  The pulmonic valve is normal in structure and function with no regurgitation.  There were no vegetations or stenosis.   11. AORTIC ARCH, ASCENDING AND DESCENDING AORTA:  There was grade 3 Myrtis Ser(Katz et. Al, 1992) shaggy, mobile atheroma at the proximal aortic arch.  12. PULMONARY VEINS: Anomalous pulmonary venous return was not noted.  13. PERICARDIUM: The pericardium appeared normal and non-thickened.  There is no pericardial effusion.  IMPRESSION:   1. 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 2. There is a small mobile mass of the non-coronary cusp of the aortic valve with trivial AI - this may represent a vegetation  as well or perhaps sclerosis of the leaflet tip. 3. No LAA thrombus 4. Negative for PFO by color doppler and saline microbubble contrast 5. Grade 3 mobile atheroma of the proximal aortic arch 6. LVEF 60-65% with normal wall  motion  RECOMMENDATIONS:    1.  Will contact CT surgery for evaluation of possible mitral valve replacement. If he is considered a surgical candidate, then would make sense [facility] (cath, etc ..).  Time Spent Directly with the Patient:  45 minutes   Chrystie NoseKenneth C. Glendola Friedhoff, MD, [facility] IncFACC, FACP  [place]  [facility]  Medical Director of the Advanced Lipid Disorders &  Cardiovascular Risk Reduction Clinic Attending Cardiologist  Direct Dial: [phone]  Fax: [phone]  Website:  [email or web address]  Lisette AbuKenneth C Giorgio Chabot 07/27/2017, 1:35 PM   ADDENDUM - I have addended the note -the note should reflect the mass is on the A3 segment of the anterior leaflet, not the posterior leaflet. It does prolapse over the posterior leaflet. There is a dense centrally-directed jet (not eccentric, if it were d/t prolapse) and dropout of an area associated with the vegetation on 3D echo which likely represents a perforation.  Chrystie NoseKenneth C. Anastassia Noack, MD, [facility]FACC, FACP  [employer]  [facility]  Medical Director of the Advanced Lipid Disorders &  Cardiovascular Risk Reduction Clinic Attending Cardiologist  Direct Dial: [phone]  Fax: [phone]  Website:  [email or web address]

## 2017-07-27 NOTE — Progress Notes (Signed)
[facility]    Date of Admission:  07/25/2017   Total days of antibiotics 2        Day 2 vancomycin           ID: Reginald [name] is a 56 y.o. male with  Principal Problem:   MRSA bacteremia Active Problems:   Mitral valve vegetation   Acute respiratory failure (HCC)   Alcohol abuse   Thrombocytopenia (HCC)   Abnormal liver enzymes   Acute encephalopathy   Acute diastolic CHF (congestive heart failure) (HCC)   Sepsis (HCC)   Endocarditis of mitral valve   Cerebral septic emboli (HCC)    Subjective: Patient remains febrile, last night decompensated. obtunded, requiring intubation last night. Underwent TEE this afternoon, and Dr Rennis Golden reported that therer is a large 2-3cm vegetation to posterior leaflet as well as perforation. In addition, small veg to anterior leaflet., severe MR with EF 60%. As well as ? veg to AV, and grade 3 shaggy mobile atheroma at the proximal aortic arch.  Attempted to go to MRI to image spine but patient unable exceeds size dimension  Blood cx from 12/4 + MRSA  Spoke for roughly 40 min with family explaining gravity of his infection and current state of health  Girlfriend reports that he drinks roughly 8-10 oz of gin per night, his grown son states he used to be heavy drinker but has decreased. No hx of cirrhosis per girlfriend's report.last week, patient reported neck pain  Medications:  . enoxaparin (LOVENOX) injection  60 mg Subcutaneous Q24H  . feeding supplement (PRO-STAT SUGAR FREE 64)  30 mL Per Tube Daily  . feeding supplement (PRO-STAT SUGAR FREE 64)  60 mL Per Tube TID  . feeding supplement (VITAL HIGH PROTEIN)  1,000 mL Per Tube Q24H  . folic acid  1 mg Intravenous Daily  . furosemide  40 mg Intravenous BID  . insulin aspart  0-20 Units Subcutaneous Q4H  . insulin aspart  3 Units Subcutaneous Q4H  . midazolam      . [START ON 07/30/2017] thiamine  100 mg Intravenous Daily   Or  . [START ON 07/30/2017]  thiamine  100 mg Oral Daily    Objective: Vital signs in last 24 hours: Temp:  [98.5 F (36.9 C)-100.5 F (38.1 C)] 99.1 F (37.3 C) (12/06 1200) Pulse Rate:  [63-93] 71 (12/06 1200) Resp:  [18-40] 25 (12/06 1200) BP: (94-180)/(61-98) 119/71 (12/06 1200) SpO2:  [90 %-100 %] 98 % (12/06 1200) FiO2 (%):  [50 %-100 %] 50 % (12/06 1144) Weight:  [257 lb 0.9 oz (116.6 kg)] 257 lb 0.9 oz (116.6 kg) (12/06 0352)  Constitutional: He is sedated, intubated. He appears well-developed and well-nourished. No distress.  HENT:  Mouth/Throat: OETT in place Cardiovascular: Normal rate, regular rhythm and normal heart sounds. Exam reveals no gallop and no friction rub.  No murmur heard.  Pulmonary/Chest: Effort normal and breath sounds normal. Bilateral rhonchi Abdominal: Soft. Bowel sounds are normal. He exhibits no distension. There is no tenderness.  Neurological: sedated. Spontaneously moved legs Skin: new stigmata of endocarditis - yesterday only had 5th digit of right hand but now has palmar lesions on both hands, possible petechaiel lesions on right leg  Lab Results Recent Labs    07/26/17 0340 07/27/17 0322  WBC 12.5* 13.0*  HGB 12.1* 11.4*  HCT 36.4* 34.1*  NA 132* 134*  K 4.1 4.2  CL 94* 98*  CO2 30 30  BUN 28* 21*  CREATININE 0.73 0.61   Liver Panel Recent Labs    07/26/17 0340 07/27/17 0322  PROT 7.1 6.7  ALBUMIN 2.2* 2.0*  AST 46* 40  ALT 58 50  ALKPHOS 245* 217*  BILITOT 1.7* 2.0*    Microbiology: 12/4 blood cx MRSA Studies/Results: Ct Head Wo Contrast  Result Date: 07/26/2017 CLINICAL DATA:  56 year old male with focal neurological deficit. Concern for stroke. Left facial droop. EXAM: CT HEAD WITHOUT CONTRAST TECHNIQUE: Contiguous axial images were obtained from the base of the skull through the vertex without intravenous contrast. COMPARISON:  None. FINDINGS: Brain: The ventricles and sulci appropriate size for patient's age. There is no acute intracranial  hemorrhage. No mass effect or midline shift noted. No extra-axial fluid collection. Vascular: No hyperdense vessel or unexpected calcification. Skull: Normal. Negative for fracture or focal lesion. Sinuses/Orbits: Mild mucoperiosteal thickening of paranasal sinuses. No air-fluid levels. The mastoid air cells are clear. Other: None IMPRESSION: Unremarkable noncontrast CT of the brain. No acute intracranial hemorrhage. Electronically Signed   By: Elgie CollardArash  Radparvar M.D.   On: 07/26/2017 01:40   Mr Brain Wo Contrast  Result Date: 07/26/2017 CLINICAL DATA:  Focal neuro deficit. MRSA bacteremia. Altered mental status EXAM: MRI HEAD WITHOUT CONTRAST TECHNIQUE: Multiplanar, multiecho pulse sequences of the brain and surrounding structures were obtained without intravenous contrast. COMPARISON:  CT head 07/26/2017 FINDINGS: Limited study. The patient not able to complete the study. Axial diffusion sagittal T1 images were obtained before study was terminated prematurely Multiple small areas of restricted diffusion are present in both cerebral hemispheres in occipital, parietal, and frontal lobes. Focal restricted diffusion right caudate. Small restricted diffusion right cerebellum. Ventricle size normal.  No midline shift. IMPRESSION: Incomplete study. 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 Consider septic emboli given the history of bacteremia. Electronically Signed   By: Marlan Palauharles  Clark M.D.   On: 07/26/2017 11:33   Dg Chest Port 1 View  Result Date: 07/27/2017 CLINICAL DATA:  Endotracheal tube placement. EXAM: PORTABLE CHEST 1 VIEW COMPARISON:  07/25/2017 . FINDINGS: Endotracheal tube noted 10.3 cm above the carina at the thoracic inlet. Advancement of approximately 5 cm should be considered. Cardiomegaly with pulmonary vascular prominence, bilateral interstitial prominence, bilateral pleural effusions again noted. Findings consistent  CHF. IMPRESSION: 1. Endotracheal tube noted 10.3 cm above the carina at the thoracic inlet. Advancement of approximately 5 cm should be considered. 2. Persistent cardiomegaly with bilateral from interstitial prominence of small pleural effusions consistent persistent CHF. Similar findings noted on prior exam. These results will be called to the ordering clinician or representative by the Radiologist Assistant, and communication documented in the PACS or zVision Dashboard. Electronically Signed   By: Maisie Fushomas  Register   On: 07/27/2017 09:00   Dg Chest Port 1 View  Result Date: 07/25/2017 CLINICAL DATA:  56 y/o  M; sepsis. EXAM: PORTABLE CHEST 1 VIEW COMPARISON:  07/24/2017 chest radiograph. FINDINGS: Stable cardiac silhouette given projection and technique. Aortic atherosclerosis with calcification. Increase hazy opacification of the lungs and reticular markings. Probable small left effusion. Fluid tracking along the right minor fissure. Bones are unremarkable. IMPRESSION: Increasing pulmonary edema and probable small effusions greater on the left. Underlying pneumonia not excluded. Electronically Signed   By: Mitzi HansenLance  Furusawa-Stratton M.D.   On: 07/25/2017 22:07     Assessment/Plan: MRSA native mitral valve endocarditis = TEE findings suggests large vegetation, perforated MV and persistent bacteremia (since 11/29 per outside admit) but appears was not  therapeutic until 12/3. HIs large burden of disease could be cause of ongoing bacteremia. Recommend to repeat blood cx tomorrow to see if making any progress with medical management.    I agree with Dr Rennis GoldenHilty to see if patient can be evaluated by CTS to see if he would be candidate for valve replacement given destruction to mitral valve from infection. continue on vancomycin  I am concerned that he may have other nidus of infections. Recommend that he gets mri of C-L-T spine to evaluate for discitis/epidural abscess  TDM = has vanco trough to check before  4th dose   Franki Alcaide, [facility] Cell: [phone] Pager: [phone]  07/27/2017, 3:01 PM

## 2017-07-27 NOTE — Progress Notes (Signed)
Procedure(s) (LRB): TRANSESOPHAGEAL ECHOCARDIOGRAM (TEE) (N/A) Subjective: Patient examined, TEE and MRI of brain and CT scan of chest images reviewed Patient with MRSA bacteremia, sepsis, moderate to severe mitral regurgitation from anterior leaflet mitral valve vegetation and leaflet damage, stable blood pressure and with bilateral hemisphere septic emboli-stroke  Patient not currently candidate for mitral valve replacement since his blood cultures remain positive and he is going through alcohol withdrawal and is intubated. Source of the bacteremia is unclear although the patient has necrotic and loose teeth on exam. Would recommend continued antibiotic therapy. Patient would be considered for surgery after his pulmonary status improves and he is extubated, he would need dental evaluation and probable therapy  to prior prosthetic valve replacement, and he would need left and right heart cardiac catheterization  prior to surgery. If the patient has significant cirrhosis that could preclude heart valve replacement.   Obj ective: Vital signs in last 24 hours: Temp:  [98.6 F (37 C)-100.5 F (38.1 C)] 99.3 F (37.4 C) (12/06 1941) Pulse Rate:  [63-93] 80 (12/06 2036) Cardiac Rhythm: Normal sinus rhythm (12/06 1935) Resp:  [18-40] 26 (12/06 2036) BP: (92-180)/(60-98) 119/65 (12/06 2036) SpO2:  [90 %-100 %] 100 % (12/06 2036) FiO2 (%):  [40 %-100 %] 40 % (12/06 2036) Weight:  [257 lb 0.9 oz (116.6 kg)] 257 lb 0.9 oz (116.6 kg) (12/06 0352)  Hemodynamic parameters for last 24 hours:    Intake/Output from previous day: 12/05 0701 - 12/06 0700 In: 551.7 [I.V.:51.7; IV Piggyback:500] Out: 2800 [Urine:2800] Intake/Output this shift: No intake/output data recorded.  Exam   sedated on ventilator, appears obese and chronically ill Scattered rhonchi No cardiac murmur appreciated Abdomen obese with large midline surgical scar Mild peripheral edema  Lab Results: Recent Labs     07/26/17 0340 07/27/17 0322  WBC 12.5* 13.0*  HGB 12.1* 11.4*  HCT 36.4* 34.1*  PLT 152 156   BMET:  Recent Labs    07/26/17 0340 07/27/17 0322  NA 132* 134*  K 4.1 4.2  CL 94* 98*  CO2 30 30  GLUCOSE 221* 200*  BUN 28* 21*  CREATININE 0.73 0.61  CALCIUM 8.6* 8.4*    PT/INR: No results for input(s): LABPROT, INR in the last 72 hours. ABG    Component Value Date/Time   PHART 7.397 07/27/2017 1120   HCO3 32.2 (H) 07/27/2017 1120   O2SAT 99.6 07/27/2017 1120   CBG (last 3)  Recent Labs    07/27/17 1202 07/27/17 1623 07/27/17 1922  GLUCAP 162* 160* 150*    Assessment/Plan: S/P Procedure(s) (LRB): TRANSESOPHAGEAL ECHOCARDIOGRAM (TEE) (N/A) Continue antibiotic therapy for sepsis and medical therapy for mitral regurgitation  patient currently  Not candidate for mitral valve replacement for the previously discussed reasons  ve LOS: 2 days    Kathlee Nationseter Van Trigt III 07/27/2017

## 2017-07-27 NOTE — Evaluation (Signed)
SLP Cancellation Note  Patient Details Name: Reginald [name] MRN: [id number] DOB: 01/04/1961   Cancelled treatment:       Reason Eval/Treat Not Completed: Other (comment);Medical issues which prohibited therapy(pt now intubated due to respiratory issues, will follow for readiness)   Reginald [name], Reginald [name] 07/27/2017, 8:52 AM   Reginald Burnetamara Gabrien Mentink, MS [facility] SLP [phone]

## 2017-07-27 NOTE — Progress Notes (Signed)
RN paged multiple times over shift for medicines to calm pt. He was combative, getting OOB, beligerant, uncooperative, and unable to be redirected. Haldol 12mg  and Ativan 3mg  had been given total when NP called PCCM. PCCM to watch pt and RN to call them if further meds are needed. Pt finally required Precedex drip. KJKG, NP [facility]

## 2017-07-27 NOTE — Progress Notes (Signed)
Initial Nutrition Assessment  DOCUMENTATION CODES:   Obesity unspecified  INTERVENTION:  - Will order Vital High Protein @ 20 mL/hr with 7 packets of Prostat/day. This regimen + kcal from current Propofol rate will provide 1642 kcal, 147 grams of protein, and 401 mL free water.  - Will monitor for changes in Propofol rate.   NUTRITION DIAGNOSIS:   Inadequate oral intake related to inability to eat as evidenced by NPO status.  GOAL:   Provide needs based on ASPEN/SCCM guidelines  MONITOR:   Vent status, TF tolerance, Weight trends, Labs, I & O's  REASON FOR ASSESSMENT:   Ventilator, Consult Enteral/tube feeding initiation and management  ASSESSMENT:   56 year old male with past medical history significant for COPD, DM, alcohol abuse, diastolic CHF, and AAA who was initially admitted to [facility] on 11/29 with nonspecific complaints of generalized weakness, low-grade fever, and muscle aches. 2D echocardiogram at that time demonstrated a mitral valve vegetation. He required ICU transfer during his time at [facility] for Precedex infusion with presumed alcohol withdrawal, was weaned off on 12/3.  The patient was transferred to [facility] for infectious diseases consultation.  On 12/4 he was found to have a new facial droop.  MRI was done and demonstrated multiple infarcts to both cerebral hemispheres consistent with septic emboli.  12/5 late p.m. he developed agitation which is refractory to repeated doses of Ativan. He was started on Precedex infusion.   BMI indicates obesity. Noted weight -7 lbs/3.6 kg compared to admission weight. No OGT/NGT in place at this time; talked with RN who reports plan for placement following TEE. Several family members at bedside at time of RD visit and visibly emotional.   Patient is currently intubated on ventilator support MV: 12.9 L/min Temp (24hrs), Avg:99.4 F (37.4 C), Min:98.5 F (36.9 C), Max:100.5 F (38.1 C) Propofol: 17.5  ml/hr (462 kcal) BP: 119/71 and MAP: 83  Medications reviewed; 1 mg IV folic acid/day, 40 mg IV Lasix BID, sliding scale Novolog, 3 units Novolog every 4 hours, 100 mg IV thiamine/day. Labs reviewed; CBGs: 209 and 184 mg/dL today, Na: 134 mmol/L, Cl: 98 mmol/L, Ca: 8.4 mg/dL, Alk Phos elevated.   Drip: Propofol @ 25 mcg/kr/min.     NUTRITION - FOCUSED PHYSICAL EXAM:  Completed; no muscle wasting or fat wasting noted, moderate generalized edema.   Diet Order:  Diet NPO time specified  EDUCATION NEEDS:   No education needs have been identified at this time  Skin:  Skin Assessment: Reviewed RN Assessment  Last BM:  PTA/unknown  Height:   Ht Readings from Last 1 Encounters:  07/25/17 '5\' 9"'$  (1.753 m)    Weight:   Wt Readings from Last 1 Encounters:  07/27/17 257 lb 0.9 oz (116.6 kg)    Ideal Body Weight:  72.73 kg  BMI:  Body mass index is 37.96 kg/m.  Estimated Nutritional Needs:   Kcal:  6962-9528 (11-14 kcal/kg)  Protein:  145 grams  Fluid:  >/= 1.7 L/day      Jarome Matin, MS, RD, LDN, [facility] Inpatient Clinical Dietitian Pager # [phone] After hours/weekend pager # [phone]

## 2017-07-27 NOTE — Progress Notes (Signed)
Patient was obtunded with increased work of breathing and hypoxia requiring NRFM pox 96%. Discussed with hospitalist and family and a decision was made to intubate. Patient tolerated the procedure very well no complications. CXR ordered to be followed up by the morning team. Neurology consult might be helpful.

## 2017-07-27 NOTE — Progress Notes (Signed)
PROGRESS NOTE    Reginald [name]  WPY:[id number] DOB: 05/20/1961 DOA: 07/25/2017 PCP: Patient, No Pcp Per   Brief Narrative:  Reginald [name] is Reginald [name] 56 y.o. male with medical history significant of diastolic CHF, AAA, COPD, DM type II, tobacco abuse, and alcohol abuse; who presents as Reginald [name] from [facility] for MRSA bacteremia.  Patient was hospitalized from 11/29 -12/4, and had initially presented with nonspecific complaints of generalized weakness, low-grade fever, vomiting, and diffuse muscle aches. In the emergency department patient was noted to be hypoxic requiring oxygen which he was not on at baseline.  Patient was found to be negative for influenza and had negative CT after noting steroid injection to the neck on 11/28.  Blood cultures had been obtained on admission and noted to grow out MRSA.  Antibiotics of vancomycin were added at that time.  2D echo revealed signs of an anterior mitral valve vegetation and EF was noted to be around 60%.  They have been administering IV Lasix to help diurese the patient as he appeared to x-rays initially showed signs of pulmonary edema.  He went into significant alcohol withdrawals requiring [name] to the ICU and initiation of Precedex drip on 12/1-2, but gtt was able to be weaned off on 12/3.  Repeat blood cultures on 12/1 and 12/3 were noted to be positive for MRSA, despite antibiotics.  Urine cultures did not grow out any specific bacteria and he was discontinued on Rocephin.  Dr. Johnnye Sima of infectious disease was consulted at [facility] and it was recommended to [name] the patient.  It appears during his hospital stay patient was also noted to Reginald [name] 4.5 cm ascending aortic aneurysm and MRI of the cervical spine showed degenerative changes without discitis or fractures.  Pt was noted to have evidence of acute infarction from emboli on 12/5, likely septic emboli given his history.  On night of 12/5-6 the patient was increasingly agitated, pulling  lines, requiring multiple doses of haldol and ativan and was eventually started on Reginald [name] precedex gtt.  On AM of 12/6, he was desatting into the 80's on 5 L [place], with increased wob and PCCM to intubate.   Assessment & Plan:   Principal Problem:   MRSA bacteremia Active Problems:   Mitral valve vegetation   Acute respiratory failure with hypoxia (HCC)   Alcohol abuse   Thrombocytopenia (HCC)   Abnormal liver enzymes   Acute encephalopathy   Acute diastolic CHF (congestive heart failure) (HCC)   Sepsis (HCC)   Endocarditis of mitral valve   Cerebral septic emboli (HCC)  Acute encephalopathy:  MRI 12/5 with evidence of acute infarction from emboli.  His AMS is likely due to septic emboli with contribution from sepsis and etoh withdrawal.   Initially thought related to alcohol and infection.  Overnight on 12/5 patient noted to have acute change with intermittant left-sided weakness and facial droop, pt was intermittently cooperative on my exam 12/5, but seemed to have symmetric 3/5 strength.  Overnight on 12/5-6, pt with worsening agitation/delirium pulling lines/foley requiring repeat doses of haldol/ativan.  PCCM c/s and pt started on precedex gtt. - Continue neurochecks every 4 hours - MRI of brain as noted above, PCCM planning for neuro c/s - started on high dose thiamine - Speech therapy consult (see note, but at this time, rec NPO)  - CIWA/precedex/haldol for agitation  Hypoxic respiratory failure, OSA:  Worsening.  Increased O2 requirement.  Desatting on 5 L [place] this morning to 80's.  ABG last night  with metabolic alkalosis, normal PCO2.   '[ ]'$  repeat CXR and ABG CPAP d/c'd with mental status  - Lasix ordered for BID, daily weights, strict I/O  **Discussed with PCCM provider this AM who is planning intubation**  MRSA bacteremia with mitral valve vegetation: Acute.  Patient noted to have persistent positive blood cultures despite IV antibiotics of vancomycin. - Repeat bcx positive with  MRSA 12/4 - Continue vancomycin as tolerated - Dr. Graylon Good of infectious disease consulted, will follow up further recommendations - Check MRI of the thoracic and lumbar spine with contrast per ID (attempted today, but unable due to AMS) - follow up TEE (looks like plan for Friday at [facility]) - discussed with Cardiothoracic surgery on 12/5 (discussed with nurse working with Dr. Roxan Hockey)  Diastolic CHF exacerbation: Last echocardiogram performed on 11/30 showing mitral valve vegetation on the anterior leaflet and EF of approximately 60%.  BNP 104.6, but pulm edema bited ib CXR, - Strict I&O's and daily weights - Lasix 40 mg BID ordered - cardiology following as well, appreciate recs  Neck pain/arthralgia: Chronic.  Patient evaluated with MRI of the cervical spine which showed only degenerative changes. - May want to follow-up with [facility] regarding CRP, ESR, RA, and CCP antibodies (will need to f/u records in chart). - Fentanyl IV prn pain   Diabetes mellitus type 2, uncontrolled.  Patient only on oral medications of metformin and glipizide at home but blood sugars noted to be in the 300s. - Hypoglycemic protocol - Hold metformin and glipizide - CBGs every 4 hours with moderate SSI  Alcohol abuse: Patient had been noted to go into alcohol withdrawals requiring patient to be placed on Precedex drip, but this was discontinuedon 12/3 prior to [name]. - as above, transitioned to CIWA/haldol/precedex - thiamine/folate  Thrombocytopenia: Platelet count noted to be 128.  Suspected to be related to patient's history of alcohol abuse.  Patient had right upper quadrant ultrasound noting signs of cirrhosis all at the outside facility. - Repeat CBC  Abnormal liver enzymes: Elevated alk phos and bili today.  Bili slightly worse at 2, alk phos improved.  Patient had been noted to have improving liver enzymes.  [place] at outside hospital as above per note (formal report not seen in chart). -  continue to monitor  AAA: Stable patient noted to have Makinzey Banes 4.5 ascending aortic aneurysm.   - Patient will need semiannual imaging with CTA or MRA imaging  Tobacco abuse - Nicotine patch - Will need to counsel need of cessation of tobacco  Obesity: BMI 39   DVT prophylaxis: lovenox Code Status: full  Family Communication: mother and daughter at bedside Disposition Plan: pending improvement   Consultants:   CT surgery  Cardiology  Infectious disease  Procedures: (Don't include imaging studies which can be auto populated. Include things that cannot be auto populated i.e. Echo, Carotid and venous dopplers, Foley, Bipap, HD, tubes/drains, wound vac, central lines etc)  Echo and RUQ [place] at  (see chart)  Antimicrobials: (specify start and planned stop date. Auto populated tables are space occupying and do not give end dates) Anti-infectives (From admission, onward)   Start     Dose/Rate Route Frequency Ordered Stop   07/26/17 1400  vancomycin (VANCOCIN) 1,500 mg in sodium chloride 0.9 % 500 mL IVPB     1,500 mg 250 mL/hr over 120 Minutes Intravenous Every 12 hours 07/26/17 1254     07/25/17 2200  vancomycin (VANCOCIN) 2,500 mg in sodium chloride 0.9 % 500 mL IVPB  Status:  Discontinued     2,500 mg 250 mL/hr over 120 Minutes Intravenous  Once 07/25/17 2124 07/25/17 2210   07/25/17 2130  vancomycin (VANCOCIN) IVPB 1000 mg/200 mL premix  Status:  Discontinued     1,000 mg 200 mL/hr over 60 Minutes Intravenous  Once 07/25/17 2118 07/25/17 2123      Subjective: Sedated on precedex gtt.  Objective: Vitals:   07/27/17 0400 07/27/17 0500 07/27/17 0600 07/27/17 0653  BP: (!) 145/89 140/78 140/90   Pulse: 88 84 80   Resp: (!) 33 (!) 40 (!) 32   Temp:    (!) 100.5 F (38.1 C)  TempSrc:    Axillary  SpO2: 92% 92% 90%   Weight:      Height:        Intake/Output Summary (Last 24 hours) at 07/27/2017 0732 Last data filed at 07/27/2017 0430 Gross per 24 hour  Intake  551.7 ml  Output 2800 ml  Net -2248.3 ml   Filed Weights   07/25/17 1815 07/26/17 0442 07/27/17 0352  Weight: 120.2 kg (264 lb 15.9 oz) 122.1 kg (269 lb 2.9 oz) 116.6 kg (257 lb 0.9 oz)    Examination:  General exam: Occasionally figetting   Respiratory system: Increased WOB, no crackles appreciated, but limited anterior exam as pt unable to cooperate Cardiovascular system: S1 & S2 heard, RRR. No JVD, murmurs, rubs, gallops or clicks. Trace pedal edema. Gastrointestinal system: Abdomen is nondistended, soft and nontender. No organomegaly or masses felt. Normal bowel sounds heard. Central nervous system: Disoriented.  Sedated on precedex gtt.  Pulling at NRB mask when this is placed. Extremities: Appears to be moving all extremities Skin: Osler nodes to R pinky, L palm, L foot (not examined today).  Psychiatry: Judgement and insight appear impaired    Data Reviewed: I have personally reviewed following labs and imaging studies  CBC: Recent Labs  Lab 07/26/17 0340 07/27/17 0322  WBC 12.5* 13.0*  NEUTROABS 11.9*  --   HGB 12.1* 11.4*  HCT 36.4* 34.1*  MCV 94.5 93.4  PLT 152 094   Basic Metabolic Panel: Recent Labs  Lab 07/25/17 2127 07/26/17 0340 07/27/17 0322  NA  --  132* 134*  K  --  4.1 4.2  CL  --  94* 98*  CO2  --  30 30  GLUCOSE  --  221* 200*  BUN  --  28* 21*  CREATININE 0.80 0.73 0.61  CALCIUM  --  8.6* 8.4*   GFR: Estimated Creatinine Clearance: 128.4 mL/min (by C-G formula based on SCr of 0.61 mg/dL). Liver Function Tests: Recent Labs  Lab 07/26/17 0340 07/27/17 0322  AST 46* 40  ALT 58 50  ALKPHOS 245* 217*  BILITOT 1.7* 2.0*  PROT 7.1 6.7  ALBUMIN 2.2* 2.0*   No results for input(s): LIPASE, AMYLASE in the last 168 hours. No results for input(s): AMMONIA in the last 168 hours. Coagulation Profile: No results for input(s): INR, PROTIME in the last 168 hours. Cardiac Enzymes: No results for input(s): CKTOTAL, CKMB, CKMBINDEX, TROPONINI in  the last 168 hours. BNP (last 3 results) No results for input(s): PROBNP in the last 8760 hours. HbA1C: No results for input(s): HGBA1C in the last 72 hours. CBG: Recent Labs  Lab 07/26/17 1212 07/26/17 1605 07/26/17 1955 07/26/17 2327 07/27/17 0350  GLUCAP 185* 217* 299* 272* 209*   Lipid Profile: No results for input(s): CHOL, HDL, LDLCALC, TRIG, CHOLHDL, LDLDIRECT in the last 72 hours. Thyroid Function Tests: No results  for input(s): TSH, T4TOTAL, FREET4, T3FREE, THYROIDAB in the last 72 hours. Anemia Panel: No results for input(s): VITAMINB12, FOLATE, FERRITIN, TIBC, IRON, RETICCTPCT in the last 72 hours. Sepsis Labs: No results for input(s): PROCALCITON, LATICACIDVEN in the last 168 hours.  Recent Results (from the past 240 hour(s))  Culture, blood (x 2)     Status: None (Preliminary result)   Collection Time: 07/25/17  9:14 PM  Result Value Ref Range Status   Specimen Description BLOOD RIGHT ANTECUBITAL  Final   Special Requests IN PEDIATRIC BOTTLE Blood Culture adequate volume  Final   Culture  Setup Time   Final    GRAM POSITIVE COCCI IN PEDIATRIC BOTTLE Organism ID to follow Performed at [facility], [address] [place], [place] [address]    Culture GRAM POSITIVE COCCI  Final   Report Status PENDING  Incomplete  Blood Culture ID Panel (Reflexed)     Status: Abnormal   Collection Time: 07/25/17  9:14 PM  Result Value Ref Range Status   Enterococcus species NOT DETECTED NOT DETECTED Final   Listeria monocytogenes NOT DETECTED NOT DETECTED Final   Staphylococcus species DETECTED (Suvi Archuletta) NOT DETECTED Final    Comment: CRITICAL RESULT CALLED TO, READ BACK BY AND VERIFIED WITH: JESSEE GRIMSLEY PHARMD AT 2225 ON [id number] BY SJW    Staphylococcus aureus DETECTED (Minah Axelrod) NOT DETECTED Final    Comment: Methicillin (oxacillin)-resistant Staphylococcus aureus (MRSA). MRSA is predictably resistant to beta-lactam antibiotics (except ceftaroline). Preferred therapy is  vancomycin unless clinically contraindicated. Patient requires contact precautions if  hospitalized. CRITICAL RESULT CALLED TO, READ BACK BY AND VERIFIED WITH: JESSE GRIMSLEY PHARMD AT 2225 ON [id number] BY SJW    Methicillin resistance DETECTED (Tavian Callander) NOT DETECTED Final    Comment: CRITICAL RESULT CALLED TO, READ BACK BY AND VERIFIED WITH: JESSE GRIMSLEY PHARMD AT 2225 ON [id number] BY SJW    Streptococcus species NOT DETECTED NOT DETECTED Final   Streptococcus agalactiae NOT DETECTED NOT DETECTED Final   Streptococcus pneumoniae NOT DETECTED NOT DETECTED Final   Streptococcus pyogenes NOT DETECTED NOT DETECTED Final   Acinetobacter baumannii NOT DETECTED NOT DETECTED Final   Enterobacteriaceae species NOT DETECTED NOT DETECTED Final   Enterobacter cloacae complex NOT DETECTED NOT DETECTED Final   Escherichia coli NOT DETECTED NOT DETECTED Final   Klebsiella oxytoca NOT DETECTED NOT DETECTED Final   Klebsiella pneumoniae NOT DETECTED NOT DETECTED Final   Proteus species NOT DETECTED NOT DETECTED Final   Serratia marcescens NOT DETECTED NOT DETECTED Final   Haemophilus influenzae NOT DETECTED NOT DETECTED Final   Neisseria meningitidis NOT DETECTED NOT DETECTED Final   Pseudomonas aeruginosa NOT DETECTED NOT DETECTED Final   Candida albicans NOT DETECTED NOT DETECTED Final   Candida glabrata NOT DETECTED NOT DETECTED Final   Candida krusei NOT DETECTED NOT DETECTED Final   Candida parapsilosis NOT DETECTED NOT DETECTED Final   Candida tropicalis NOT DETECTED NOT DETECTED Final    Comment: Performed at [facility], [place]. [address] [place], [place] [address]         Radiology Studies: Ct Head Wo Contrast  Result Date: 07/26/2017 CLINICAL DATA:  56 year old male with focal neurological deficit. Concern for stroke. Left facial droop. EXAM: CT HEAD WITHOUT CONTRAST TECHNIQUE: Contiguous axial images were obtained from the base of the skull through the vertex without intravenous  contrast. COMPARISON:  None. FINDINGS: Brain: The ventricles and sulci appropriate size for patient's age. There is no acute intracranial hemorrhage. No mass effect  or midline shift noted. No extra-axial fluid collection. Vascular: No hyperdense vessel or unexpected calcification. Skull: Normal. Negative for fracture or focal lesion. Sinuses/Orbits: Mild mucoperiosteal thickening of paranasal sinuses. No air-fluid levels. The mastoid air cells are clear. Other: None IMPRESSION: Unremarkable noncontrast CT of the brain. No acute intracranial hemorrhage. Electronically Signed   By: Anner Crete M.D.   On: 07/26/2017 01:40   Mr Brain Wo Contrast  Result Date: 07/26/2017 CLINICAL DATA:  Focal neuro deficit. MRSA bacteremia. Altered mental status EXAM: MRI HEAD WITHOUT CONTRAST TECHNIQUE: Multiplanar, multiecho pulse sequences of the brain and surrounding structures were obtained without intravenous contrast. COMPARISON:  CT head 07/26/2017 FINDINGS: Limited study. The patient not able to complete the study. Axial diffusion sagittal T1 images were obtained before study was terminated prematurely Multiple small areas of restricted diffusion are present in both cerebral hemispheres in occipital, parietal, and frontal lobes. Focal restricted diffusion right caudate. Small restricted diffusion right cerebellum. Ventricle size normal.  No midline shift. IMPRESSION: Incomplete study. 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 Consider septic emboli given the history of bacteremia. Electronically Signed   By: Franchot Gallo M.D.   On: 07/26/2017 11:33   Dg Chest Port 1 View  Result Date: 07/25/2017 CLINICAL DATA:  56 y/o  M; sepsis. EXAM: PORTABLE CHEST 1 VIEW COMPARISON:  07/24/2017 chest radiograph. FINDINGS: Stable cardiac silhouette given projection and technique. Aortic atherosclerosis with calcification. Increase hazy opacification  of the lungs and reticular markings. Probable small left effusion. Fluid tracking along the right minor fissure. Bones are unremarkable. IMPRESSION: Increasing pulmonary edema and probable small effusions greater on the left. Underlying pneumonia not excluded. Electronically Signed   By: Kristine Garbe M.D.   On: 07/25/2017 22:07        Scheduled Meds: . enoxaparin (LOVENOX) injection  60 mg Subcutaneous Q24H  . folic acid  1 mg Intravenous Daily  . furosemide  40 mg Intravenous BID  . insulin aspart  0-15 Units Subcutaneous Q4H  . insulin glargine  7 Units Subcutaneous QHS  . nicotine  21 mg Transdermal Daily  . [START ON 07/30/2017] thiamine  100 mg Intravenous Daily   Or  . [START ON 07/30/2017] thiamine  100 mg Oral Daily   Continuous Infusions: . dexmedetomidine (PRECEDEX) IV infusion 1.1 mcg/kg/hr (07/27/17 0702)  . dexmedetomidine    . thiamine injection    . vancomycin Stopped (07/27/17 0249)     LOS: 2 days    Time spent: over 30 minutes    Fayrene Helper, MD [facility] Pager [phone]  If 7PM-7AM, please contact night-coverage [email or web address] Password [email or web address] 07/27/2017, 7:32 AM

## 2017-07-27 NOTE — Progress Notes (Signed)
Per CCM, ETT advanced 3cm from 22 to 25cm

## 2017-07-27 NOTE — Consult Note (Addendum)
Requesting Physician: Dr. Lowell Guitar    Chief Complaint: Stroke  History obtained from:    Chart  As patient is on the vent  HPI:                                                                                                                                         Reginald [name] is an 56 y.o. male "male with medical history significant of diastolic CHF, AAA, COPD, DM type II, tobacco abuse, and alcohol abuse; who presents as a transfer from [facility] for MRSA bacteremia.  Patient was hospitalized from 11/29 -12/4, and had initially presented with nonspecific complaints of generalized weakness, low-grade fever, vomiting, and diffuse muscle aches. In the emergency department patient was noted to be hypoxic requiring oxygen which he was not on at baseline.  Patient was found to be negative for influenza and had negative CT after noting steroid injection to the neck on 11/28.  Blood cultures had been obtained on admission and noted to grow out MRSA.  Antibiotics of vancomycin were added at that time.  2D echo revealed signs of an anterior mitral valve vegetation and EF was noted to be around 60%.  They have been administering IV Lasix to help diurese the patient as he appeared to x-rays initially showed signs of pulmonary edema.  He went into significant alcohol withdrawals requiring transfer to the ICU" . Yesterday a MRI brain was obtained showing TEE was done today and shows a large 2-3 cm vegetation of the posterior leaflet with a discrete jet suggestive of leaflet perforation - this was confirmed with dropout on the 3D images. There is a smaller mass on the anterior leaflet tip. There are 2 distinct MR jets and in total there is Severe regurgitation. 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  Consider septic emboli given the history of bacteremia.   Patient is on the vent   Date last known well: Unable to  determine Time last known well: Unable to determine tPA Given: No: bacterial source Modified Rankin: Rankin Score=0    Past Medical History:  Diagnosis Date  . Alcohol abuse   . Diabetes mellitus type 2 in obese (HCC)   . Neck pain   . Tobacco abuse     Past Surgical History:  Procedure Laterality Date  . HERNIA REPAIR  2007  . Tonsillectomy /adnoidectomy     as aa child    Family History  Problem Relation Age of Onset  . Hypertension Mother   . Hypertension Father    Social History:  reports that he has been smoking cigarettes.  he has never used smokeless tobacco. He reports that he drinks about 2.4 - 3.0 oz of alcohol per week. He reports that he does not use drugs.  Allergies:  Allergies  Allergen Reactions  . Morphine And Related  Medications:                                                                                                                           Prior to Admission:  Medications Prior to Admission  Medication Sig Dispense Refill Last Dose  . amLODipine (NORVASC) 10 MG tablet Take 10 mg by mouth daily.  0 07/25/2017 at Unknown time  . diazepam (VALIUM) 2 MG tablet Take 2 mg by mouth 2 (two) times daily.  1 07/25/2017 at Unknown time  . gabapentin (NEURONTIN) 300 MG capsule Take 300 mg by mouth 2 (two) times daily.  1 07/25/2017 at Unknown time  . glipiZIDE (GLUCOTROL) 10 MG tablet Take 10 mg by mouth daily.  2 07/25/2017 at Unknown time  . hydrochlorothiazide (HYDRODIURIL) 25 MG tablet Take 25 mg by mouth daily.  3 07/25/2017 at Unknown time  . lisinopril (PRINIVIL,ZESTRIL) 20 MG tablet Take 20 mg by mouth daily.  3 07/25/2017 at Unknown time  . metFORMIN (GLUCOPHAGE) 500 MG tablet Take 500 mg by mouth 2 (two) times daily.  2 07/25/2017 at Unknown time  . naproxen sodium (ALEVE) 220 MG tablet Take 220-440 mg by mouth 2 (two) times daily as needed (pain).   Past Month at Unknown time  . sildenafil (REVATIO) 20 MG tablet Take 50 mg by mouth as directed.  Take 50mg  by mouth 1 hour before intercourse  0 Past Month at Unknown time   Scheduled: . enoxaparin (LOVENOX) injection  60 mg Subcutaneous Q24H  . feeding supplement (PRO-STAT SUGAR FREE 64)  30 mL Per Tube Daily  . feeding supplement (PRO-STAT SUGAR FREE 64)  60 mL Per Tube TID  . feeding supplement (VITAL HIGH PROTEIN)  1,000 mL Per Tube Q24H  . folic acid  1 mg Intravenous Daily  . furosemide  40 mg Intravenous BID  . insulin aspart  0-20 Units Subcutaneous Q4H  . insulin aspart  3 Units Subcutaneous Q4H  . midazolam      . [START ON 07/30/2017] thiamine  100 mg Intravenous Daily   Or  . [START ON 07/30/2017] thiamine  100 mg Oral Daily   Continuous: . sodium chloride    . propofol (DIPRIVAN) infusion 40 mcg/kg/min (07/27/17 1335)  . thiamine injection    . vancomycin Stopped (07/27/17 0249)    ROS:  History unable to obtain as he is on the vent   Neurologic Examination:                                                                                                      Blood pressure 119/71, pulse 71, temperature 99.1 F (37.3 C), temperature source Axillary, resp. rate (!) 25, height 5\' 9"  (1.753 m), weight 116.6 kg (257 lb 0.9 oz), SpO2 98 %.  HEENT-  Normocephalic, no lesions, without obvious abnormality.  Normal external eye and conjunctiva.  Normal TM's bilaterally.  Normal auditory canals and external ears. Normal external nose, mucus membranes and septum.  Normal pharynx. Cardiovascular- S1, S2 normal, pulses palpable throughout   Lungs- chest clear, no wheezing, rales, normal symmetric air entry Abdomen- normal findings: bowel sounds normal Extremities- no edema Lymph-no adenopathy palpable Musculoskeletal-no joint tenderness, deformity or swelling Skin-warm and dry, no hyperpigmentation, vitiligo, or suspicious lesions  Neurological  Examination Mental Status: Patient does respond to noxious stimuli.  Does doest respond to deep sternal rub.  Does not follow commands.  No verbalizations noted.  Cranial Nerves: II: patient does not respond confrontation bilaterally, pupils right 2 mm, left 2 mm,and reactive bilaterally III,IV,VI: doll's response present bilaterally.  V,VII: corneal reflex present bilaterally  VIII: patient does not respond to verbal stimuli IX,X: gag reflex present, XI: trapezius strength unable to test bilaterally XII: tongue strength unable to test Motor: Moving spontaneously to noxious stimuli. Sensory: Does not respond to noxious stimuli in any extremity. Deep Tendon Reflexes:  Depressed throught Plantars: equivocal bilaterally Cerebellar: Unable to perform         Lab Results: Basic Metabolic Panel: Recent Labs  Lab 07/25/17 2127 07/26/17 0340 07/27/17 0322 07/27/17 1113  NA  --  132* 134*  --   K  --  4.1 4.2  --   CL  --  94* 98*  --   CO2  --  30 30  --   GLUCOSE  --  221* 200*  --   BUN  --  28* 21*  --   CREATININE 0.80 0.73 0.61  --   CALCIUM  --  8.6* 8.4*  --   MG  --   --   --  2.0  PHOS  --   --   --  5.1*    Liver Function Tests: Recent Labs  Lab 07/26/17 0340 07/27/17 0322  AST 46* 40  ALT 58 50  ALKPHOS 245* 217*  BILITOT 1.7* 2.0*  PROT 7.1 6.7  ALBUMIN 2.2* 2.0*   No results for input(s): LIPASE, AMYLASE in the last 168 hours. No results for input(s): AMMONIA in the last 168 hours.  CBC: Recent Labs  Lab 07/26/17 0340 07/27/17 0322  WBC 12.5* 13.0*  NEUTROABS 11.9*  --   HGB 12.1* 11.4*  HCT 36.4* 34.1*  MCV 94.5 93.4  PLT 152 156    Cardiac Enzymes: No results for input(s): CKTOTAL, CKMB, CKMBINDEX, TROPONINI in the last 168 hours.  Lipid Panel: Recent Labs  Lab 07/27/17 0322  TRIG 176*  CBG: Recent Labs  Lab 07/26/17 1955 07/26/17 2327 07/27/17 0350 07/27/17 0900 07/27/17 1202  GLUCAP 299* 272* 209* 184* 162*     Microbiology: Results for orders placed or performed during the hospital encounter of 07/25/17  Culture, blood (x 2)     Status: None (Preliminary result)   Collection Time: 07/25/17  9:09 PM  Result Value Ref Range Status   Specimen Description BLOOD RIGHT HAND  Final   Special Requests   Final    BOTTLES DRAWN AEROBIC AND ANAEROBIC Blood Culture adequate volume   Culture   Final    NO GROWTH 1 DAY Performed at [facility], [address] [place], [place] [address]    Report Status PENDING  Incomplete  Culture, blood (x 2)     Status: Abnormal (Preliminary result)   Collection Time: 07/25/17  9:14 PM  Result Value Ref Range Status   Specimen Description BLOOD RIGHT ANTECUBITAL  Final   Special Requests IN PEDIATRIC BOTTLE Blood Culture adequate volume  Final   Culture  Setup Time   Final    GRAM POSITIVE COCCI IN PEDIATRIC BOTTLE CRITICAL RESULT CALLED TO, READ BACK BY AND VERIFIED WITH: JESSE GRIMSLEY PHARMD AT 2225 ON [id number] BY SJW    Culture (A)  Final    STAPHYLOCOCCUS AUREUS SUSCEPTIBILITIES TO FOLLOW Performed at [facility], [address] [place], [place] [address]    Report Status PENDING  Incomplete  Blood Culture ID Panel (Reflexed)     Status: Abnormal   Collection Time: 07/25/17  9:14 PM  Result Value Ref Range Status   Enterococcus species NOT DETECTED NOT DETECTED Final   Listeria monocytogenes NOT DETECTED NOT DETECTED Final   Staphylococcus species DETECTED (A) NOT DETECTED Final    Comment: CRITICAL RESULT CALLED TO, READ BACK BY AND VERIFIED WITH: JESSEE GRIMSLEY PHARMD AT 2225 ON [id number] BY SJW    Staphylococcus aureus DETECTED (A) NOT DETECTED Final    Comment: Methicillin (oxacillin)-resistant Staphylococcus aureus (MRSA). MRSA is predictably resistant to beta-lactam antibiotics (except ceftaroline). Preferred therapy is vancomycin unless clinically contraindicated. Patient requires contact precautions if  hospitalized. CRITICAL  RESULT CALLED TO, READ BACK BY AND VERIFIED WITH: JESSE GRIMSLEY PHARMD AT 2225 ON [id number] BY SJW    Methicillin resistance DETECTED (A) NOT DETECTED Final    Comment: CRITICAL RESULT CALLED TO, READ BACK BY AND VERIFIED WITH: JESSE GRIMSLEY PHARMD AT 2225 ON [id number] BY SJW    Streptococcus species NOT DETECTED NOT DETECTED Final   Streptococcus agalactiae NOT DETECTED NOT DETECTED Final   Streptococcus pneumoniae NOT DETECTED NOT DETECTED Final   Streptococcus pyogenes NOT DETECTED NOT DETECTED Final   Acinetobacter baumannii NOT DETECTED NOT DETECTED Final   Enterobacteriaceae species NOT DETECTED NOT DETECTED Final   Enterobacter cloacae complex NOT DETECTED NOT DETECTED Final   Escherichia coli NOT DETECTED NOT DETECTED Final   Klebsiella oxytoca NOT DETECTED NOT DETECTED Final   Klebsiella pneumoniae NOT DETECTED NOT DETECTED Final   Proteus species NOT DETECTED NOT DETECTED Final   Serratia marcescens NOT DETECTED NOT DETECTED Final   Haemophilus influenzae NOT DETECTED NOT DETECTED Final   Neisseria meningitidis NOT DETECTED NOT DETECTED Final   Pseudomonas aeruginosa NOT DETECTED NOT DETECTED Final   Candida albicans NOT DETECTED NOT DETECTED Final   Candida glabrata NOT DETECTED NOT DETECTED Final   Candida krusei NOT DETECTED NOT DETECTED Final   Candida parapsilosis NOT DETECTED NOT DETECTED Final   Candida tropicalis NOT DETECTED  NOT DETECTED Final    Comment: Performed at [facility], [address] [place], [place] [address]    Coagulation Studies: No results for input(s): LABPROT, INR in the last 72 hours.  Imaging: Ct Head Wo Contrast  Result Date: 07/26/2017 CLINICAL DATA:  56 year old male with focal neurological deficit. Concern for stroke. Left facial droop. EXAM: CT HEAD WITHOUT CONTRAST TECHNIQUE: Contiguous axial images were obtained from the base of the skull through the vertex without intravenous contrast. COMPARISON:  None. FINDINGS: Brain: The  ventricles and sulci appropriate size for patient's age. There is no acute intracranial hemorrhage. No mass effect or midline shift noted. No extra-axial fluid collection. Vascular: No hyperdense vessel or unexpected calcification. Skull: Normal. Negative for fracture or focal lesion. Sinuses/Orbits: Mild mucoperiosteal thickening of paranasal sinuses. No air-fluid levels. The mastoid air cells are clear. Other: None IMPRESSION: Unremarkable noncontrast CT of the brain. No acute intracranial hemorrhage. Electronically Signed   By: Elgie CollardArash  Radparvar M.D.   On: 07/26/2017 01:40   Mr Brain Wo Contrast  Result Date: 07/26/2017 CLINICAL DATA:  Focal neuro deficit. MRSA bacteremia. Altered mental status EXAM: MRI HEAD WITHOUT CONTRAST TECHNIQUE: Multiplanar, multiecho pulse sequences of the brain and surrounding structures were obtained without intravenous contrast. COMPARISON:  CT head 07/26/2017 FINDINGS: Limited study. The patient not able to complete the study. Axial diffusion sagittal T1 images were obtained before study was terminated prematurely Multiple small areas of restricted diffusion are present in both cerebral hemispheres in occipital, parietal, and frontal lobes. Focal restricted diffusion right caudate. Small restricted diffusion right cerebellum. Ventricle size normal.  No midline shift. IMPRESSION: Incomplete study. 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 Consider septic emboli given the history of bacteremia. Electronically Signed   By: Marlan Palauharles  Clark M.D.   On: 07/26/2017 11:33             Dg Chest Port 1 View  Result Date: 07/27/2017 CLINICAL DATA:  Endotracheal tube placement. EXAM: PORTABLE CHEST 1 VIEW COMPARISON:  07/25/2017 . FINDINGS: Endotracheal tube noted 10.3 cm above the carina at the thoracic inlet. Advancement of approximately 5 cm should be considered. Cardiomegaly with pulmonary vascular  prominence, bilateral interstitial prominence, bilateral pleural effusions again noted. Findings consistent CHF. IMPRESSION: 1. Endotracheal tube noted 10.3 cm above the carina at the thoracic inlet. Advancement of approximately 5 cm should be considered. 2. Persistent cardiomegaly with bilateral from interstitial prominence of small pleural effusions consistent persistent CHF. Similar findings noted on prior exam. These results will be called to the ordering clinician or representative by the Radiologist Assistant, and communication documented in the PACS or zVision Dashboard. Electronically Signed   By: Maisie Fushomas  Register   On: 07/27/2017 09:00   Dg Chest Port 1 View  Result Date: 07/25/2017 CLINICAL DATA:  56 y/o  M; sepsis. EXAM: PORTABLE CHEST 1 VIEW COMPARISON:  07/24/2017 chest radiograph. FINDINGS: Stable cardiac silhouette given projection and technique. Aortic atherosclerosis with calcification. Increase hazy opacification of the lungs and reticular markings. Probable small left effusion. Fluid tracking along the right minor fissure. Bones are unremarkable. IMPRESSION: Increasing pulmonary edema and probable small effusions greater on the left. Underlying pneumonia not excluded. Electronically Signed   By: Mitzi HansenLance  Furusawa-Stratton M.D.   On: 07/25/2017 22:07       Assessment and plan discussed with with attending physician and they are in agreement.    Felicie MornDavid Smith PA-C [facility] [phone]  07/27/2017, 2:17  PM   Assessment: 56 y.o. male with MRSA sepsis and noted vegetation on his mitral valve. MRI revealed multiple bilateral punctate strokes most likely septic emboli. Exam limited due to intubation and sedation. No focal or lateralizing abnormalities noted.   Stroke Risk Factors - diabetes mellitus, Endocarditis  Recommend: -treat underlying infection -cardiology is on board -no ASA at this time as source is infectious.  -CTA head and neck to look for mycotic  aneurysms -ETOH withdrawal CIWA -if not responding when sedation off consider EEG - NIHSS and neurochecks    NEUROHOSPITALIST ADDENDUM Seen and examined the patient this AM. Formulated plan as documented above. Recommendations as above.  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 They are small and should not prevent patient from receiving valve replacement if he is a candidate.  PLEASE PERFORM CTA HEAD to look for mycotic aneurysms if valve replacement surgery is being planned. We will need to treat them before surgery if present as they can rupture when patient receives anticoagulation.    Georgiana Spinner Aroor MD [facility] [phone]  If 7pm to 7am, please call on call as listed on AMION.

## 2017-07-27 NOTE — Procedures (Signed)
Intubation Procedure Note Reginald [name] [id number] 09-13-1960  Procedure: Intubation Indications: Respiratory insufficiency  Procedure Details Consent: Unable to obtain consent because of emergent medical necessity. Time Out: Verified patient identification, verified procedure, site/side was marked, verified correct patient position, special equipment/implants available, medications/allergies/relevent history reviewed, required imaging and test results available.  Performed  Maximum sterile technique was used including gloves and mask.  MAC and 3 glidoscope with size 7.5 ETT inserted from first attempt with good color change and equal air sounds bilaterally. 22cm at the lips       Evaluation Hemodynamic Status: BP stable throughout; O2 sats: stable throughout Patient's Current Condition: stable Complications: No apparent complications Patient did tolerate procedure well. Chest X-ray ordered to verify placement.  CXR: pending.   Reginald [name] Reginald [name] Reginald [name] 07/27/2017

## 2017-07-27 NOTE — Progress Notes (Signed)
eLink Physician-Brief Progress Note Patient Name: Reginald [name] DOB: 06/12/1961 MRN: [id number]   Date of Service  07/27/2017  HPI/Events of Note  Contacted by bedside nurse regarding ongoing alcohol withdrawal and agitation. 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 History of alcohol use. Camera shows patient laying in bed. Not currently combative. Appears calm and only minimally restless.   eICU Interventions  1. Switching to high-dose IV thiamine for treatment of encephalopathy 2. Switching to stepdown CIWA protocol 3. Switching folic acid to IV daily      Intervention Category Major Interventions: Delirium, psychosis, severe agitation - evaluation and management  Reginald [name] 07/27/2017, 1:21 AM

## 2017-07-27 NOTE — Progress Notes (Signed)
Progress Note  Patient Name: Reginald [name] Date of Encounter: 07/27/2017  Primary Cardiologist: New Dr Rennis GoldenHilty  Subjective   Pt sedated and intubated. Mittens on hands. Currently calm.   Inpatient Medications    Scheduled Meds: . enoxaparin (LOVENOX) injection  60 mg Subcutaneous Q24H  . etomidate  30 mg Intravenous Once  . folic acid  1 mg Intravenous Daily  . furosemide  40 mg Intravenous BID  . insulin aspart  0-15 Units Subcutaneous Q4H  . insulin glargine  7 Units Subcutaneous QHS  . midazolam      . nicotine  21 mg Transdermal Daily  . [START ON 07/30/2017] thiamine  100 mg Intravenous Daily   Or  . [START ON 07/30/2017] thiamine  100 mg Oral Daily   Continuous Infusions: . dexmedetomidine (PRECEDEX) IV infusion 1.2 mcg/kg/hr (07/27/17 0824)  . dexmedetomidine    . thiamine injection    . vancomycin Stopped (07/27/17 0249)   PRN Meds: acetaminophen **OR** acetaminophen, fentaNYL (SUBLIMAZE) injection, haloperidol lactate, haloperidol lactate, hydrALAZINE, ipratropium-albuterol, LORazepam, ondansetron **OR** ondansetron (ZOFRAN) IV   Vital Signs    Vitals:   07/27/17 0400 07/27/17 0500 07/27/17 0600 07/27/17 0653  BP: (!) 145/89 140/78 140/90   Pulse: 88 84 80   Resp: (!) 33 (!) 40 (!) 32   Temp:    (!) 100.5 F (38.1 C)  TempSrc:    Axillary  SpO2: 92% 92% 90%   Weight:      Height:        Intake/Output Summary (Last 24 hours) at 07/27/2017 0835 Last data filed at 07/27/2017 0430 Gross per 24 hour  Intake 551.7 ml  Output 2800 ml  Net -2248.3 ml   Filed Weights   07/25/17 1815 07/26/17 0442 07/27/17 0352  Weight: 264 lb 15.9 oz (120.2 kg) 269 lb 2.9 oz (122.1 kg) 257 lb 0.9 oz (116.6 kg)    Telemetry    Sinus rhythm, rates 100 during the night, 70's-80s this am - Personally Reviewed  ECG    No new tracings - Personally Reviewed  Physical Exam   GEN: Sedated, intubated. No acute distress.   Neck: No JVD Cardiac: RRR, no murmurs, rubs, or  gallops.  Respiratory: Clear to auscultation bilaterally except scattered rhonchi GI: Soft, nontender, non-distended  MS: No edema; No deformity. Neuro:  sedated Psych: sedated  Labs    Chemistry Recent Labs  Lab 07/25/17 2127 07/26/17 0340 07/27/17 0322  NA  --  132* 134*  K  --  4.1 4.2  CL  --  94* 98*  CO2  --  30 30  GLUCOSE  --  221* 200*  BUN  --  28* 21*  CREATININE 0.80 0.73 0.61  CALCIUM  --  8.6* 8.4*  PROT  --  7.1 6.7  ALBUMIN  --  2.2* 2.0*  AST  --  46* 40  ALT  --  58 50  ALKPHOS  --  245* 217*  BILITOT  --  1.7* 2.0*  GFRNONAA >60 >60 >60  GFRAA >60 >60 >60  ANIONGAP  --  8 6     Hematology Recent Labs  Lab 07/26/17 0340 07/27/17 0322  WBC 12.5* 13.0*  RBC 3.85* 3.65*  HGB 12.1* 11.4*  HCT 36.4* 34.1*  MCV 94.5 93.4  MCH 31.4 31.2  MCHC 33.2 33.4  RDW 14.0 13.9  PLT 152 156    Cardiac EnzymesNo results for input(s): TROPONINI in the last 168 hours. No results for input(s): TROPIPOC in  the last 168 hours.   BNP Recent Labs  Lab 07/26/17 0340  BNP 104.6*     DDimer No results for input(s): DDIMER in the last 168 hours.   Radiology    Ct Head Wo Contrast  Result Date: 07/26/2017 CLINICAL DATA:  56 year old male with focal neurological deficit. Concern for stroke. Left facial droop. EXAM: CT HEAD WITHOUT CONTRAST TECHNIQUE: Contiguous axial images were obtained from the base of the skull through the vertex without intravenous contrast. COMPARISON:  None. FINDINGS: Brain: The ventricles and sulci appropriate size for patient's age. There is no acute intracranial hemorrhage. No mass effect or midline shift noted. No extra-axial fluid collection. Vascular: No hyperdense vessel or unexpected calcification. Skull: Normal. Negative for fracture or focal lesion. Sinuses/Orbits: Mild mucoperiosteal thickening of paranasal sinuses. No air-fluid levels. The mastoid air cells are clear. Other: None IMPRESSION: Unremarkable noncontrast CT of the  brain. No acute intracranial hemorrhage. Electronically Signed   By: Elgie Collard M.D.   On: 07/26/2017 01:40   Mr Brain Wo Contrast  Result Date: 07/26/2017 CLINICAL DATA:  Focal neuro deficit. MRSA bacteremia. Altered mental status EXAM: MRI HEAD WITHOUT CONTRAST TECHNIQUE: Multiplanar, multiecho pulse sequences of the brain and surrounding structures were obtained without intravenous contrast. COMPARISON:  CT head 07/26/2017 FINDINGS: Limited study. The patient not able to complete the study. Axial diffusion sagittal T1 images were obtained before study was terminated prematurely Multiple small areas of restricted diffusion are present in both cerebral hemispheres in occipital, parietal, and frontal lobes. Focal restricted diffusion right caudate. Small restricted diffusion right cerebellum. Ventricle size normal.  No midline shift. IMPRESSION: Incomplete study. 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 Consider septic emboli given the history of bacteremia. Electronically Signed   By: Marlan Palau M.D.   On: 07/26/2017 11:33   Dg Chest Port 1 View  Result Date: 07/25/2017 CLINICAL DATA:  56 y/o  M; sepsis. EXAM: PORTABLE CHEST 1 VIEW COMPARISON:  07/24/2017 chest radiograph. FINDINGS: Stable cardiac silhouette given projection and technique. Aortic atherosclerosis with calcification. Increase hazy opacification of the lungs and reticular markings. Probable small left effusion. Fluid tracking along the right minor fissure. Bones are unremarkable. IMPRESSION: Increasing pulmonary edema and probable small effusions greater on the left. Underlying pneumonia not excluded. Electronically Signed   By: Mitzi Hansen M.D.   On: 07/25/2017 22:07    Cardiac Studies   Echocardiogram 07/21/17 done at [facility] Mild concentric LVH with normal systolic function, EF ~60%. Diastolic filling pattern suggests increased left  atrial pressure. LAE. Normal aortic valve. Normal right heart structure.  Echodensity c/w vegetation is noted on the anterior leaflet of the mitral valve. Moderate, posteriorly directed mitral regurgitation. No pericardial effusion.   For TEE tomorrow  Patient Profile     56 y.o. male with a hx of diastolic CHF, AAA, COPD, DM type 2, tobacco use and alcohol use who is being seen for the evaluation of CHF and bacteremia/endocarditis transferred from [facility].   Assessment & Plan    MRSA Bacteremia/Endocarditis -With mitral valve vegetation noted on TTE -Pt noted to have persistent positive blood cultures despite IV Vancomycin -Appreciate Infectious disease service and antibiotic management. -An MRI was obtained which shows multi-infarct pattern consistent with septic emboli. -Plan for TEE tomorrow  -Cardiothoracic surgery has been consulted by Dr. Lowell Guitar, awaiting their evaluation. They may be waiting for the TEE.  Acute Diastolic CHF -Chest xray 12/4 showed increasing pulmonary  edema.  -EF normal by echo on 11/30, but there was evidence of diastolic dysfunction -Continuing with diuresis. Lasix 40 mg IV BID. Wt is down 12 lbs from yesterday per chart. Pt had 2.8L UOP yest and he is net negative 4L fluid balance [facility].  -Renal function is stable with SCr 0.61 and good urine output.  -Pt had increased agitation and work of breathing over night and is currently intubated.  Hypoxic respiratory failure -Pt had increased agitation and work of breathing over night and is now intubated and sedated.   Hypertension -Home meds include amlodipine 10 mg, lisinopril 20 mg, hydrochlorothiazide 25 mg.  -On no antihypertensives in setting of acute illness. Blood pressures now running in the 140's-170's/70's-90's. BP better this am with pt sedated.  Acute encephalopathy -Pt had acute alcohol withdrawal when at [facility]. Still with altered MS. Overnight pt noted to have  acute change with intermittent left sided weakness and facial droop.  -Non contrast CT of the head was unremarkable. Workup by IM.  -Possibly having septic emboli -Now intubated and sedated. Was very agitated during the night.  -On CIWA protocol  AAA -Noted to have 4.5 cm ascending aortic aneurysm, recommendation for semiannual imaging  Tobacco use -nicotine patch on  Thrombocytopenia -Platelets were 128 thought to be related to history of alcohol abuse.   Today normal at 152.  Diabetes -Unknown A1c -Management per primary   For questions or updates, please contact [facility] Please consult [email or web address] for contact info under Cardiology/STEMI.      Signed, Berton BonJanine Kielan Dreisbach, NP  07/27/2017, 8:35 AM

## 2017-07-27 NOTE — Consult Note (Signed)
PULMONARY / CRITICAL CARE MEDICINE   Name: Reginald [name] MRN: [id number] DOB: 04/01/1961    ADMISSION DATE:  07/25/2017 CONSULTATION DATE:  12/6  REFERRING MD:  [facility]  CHIEF COMPLAINT:  agitation  HISTORY OF PRESENT ILLNESS:   56 year old male with past medical history as below, which is significant for COPD, diabetes mellitus, alcohol abuse, diastolic CHF, and AAA who was initially admitted to [facility] with nonspecific complaints of generalized weakness, low-grade fever, and muscle aches.  Blood cultures done at admission grew out MRSA and he was started on vancomycin.  2D echocardiogram at that time demonstrated a mitral valve vegetation.  He did require ICU transfer [facility] for Precedex infusion with presumed alcohol withdrawal was weaned off on 12/3.  [facility] for infectious diseases consultation.  On 12/4 he was found to have a new facial droop.  MRI was done and demonstrated multiple infarcts to both cerebral hemispheres consistent with septic emboli.  12/5 late p.m. he developed agitation which is refractory to repeated doses of Ativan.  He was started on Precedex infusion.  PCCM asked to see in consultation.  PAST MEDICAL HISTORY :  He  has a past medical history of Alcohol abuse, Diabetes mellitus type 2 in obese [facility](HCC), Neck pain, and Tobacco abuse.  PAST SURGICAL HISTORY: He  has a past surgical history that includes Hernia repair (2007) and Tonsillectomy /adnoidectomy.  Allergies  Allergen Reactions  . Morphine And Related     No current facility-administered medications on file prior to encounter.    Current Outpatient Medications on File Prior to Encounter  Medication Sig  . amLODipine (NORVASC) 10 MG tablet Take 10 mg by mouth daily.  . diazepam (VALIUM) 2 MG tablet Take 2 mg by mouth 2 (two) times daily.  Marland Kitchen. gabapentin (NEURONTIN) 300 MG capsule Take 300 mg by mouth 2 (two) times  daily.  Marland Kitchen. glipiZIDE (GLUCOTROL) 10 MG tablet Take 10 mg by mouth daily.  . hydrochlorothiazide (HYDRODIURIL) 25 MG tablet Take 25 mg by mouth daily.  Marland Kitchen. lisinopril (PRINIVIL,ZESTRIL) 20 MG tablet Take 20 mg by mouth daily.  . metFORMIN (GLUCOPHAGE) 500 MG tablet Take 500 mg by mouth 2 (two) times daily.  . naproxen sodium (ALEVE) 220 MG tablet Take 220-440 mg by mouth 2 (two) times daily as needed (pain).  . sildenafil (REVATIO) 20 MG tablet Take 50 mg by mouth as directed. Take 50mg  by mouth 1 hour before intercourse    FAMILY HISTORY:  His indicated that his mother is alive. He indicated that his father is alive. He indicated that his sister is alive. He indicated that both of his brothers are alive.   SOCIAL HISTORY: He  reports that he has been smoking cigarettes.  he has never used smokeless tobacco. He reports that he drinks about 2.4 - 3.0 oz of alcohol per week. He reports that he does not use drugs.  REVIEW OF SYSTEMS:   Unable as patient is encephalopathic and intubated  SUBJECTIVE:   VITAL SIGNS: BP (!) 144/74 (BP Location: Right Arm)   Pulse 94   Temp 98.9 F (37.2 C) (Oral)   Resp 18   Ht 5\' 9"  (1.753 m)   Wt 122.1 kg (269 lb 2.9 oz)   SpO2 100%   BMI 39.75 kg/m   HEMODYNAMICS:    VENTILATOR SETTINGS:    INTAKE / OUTPUT: I/O last 3 completed shifts: In: 500 [IV Piggyback:500] Out:  2600 [Urine:2600]  PHYSICAL EXAMINATION: General:  Obese middle aged male resting comfortably in bed Neuro:  Sleeping, awakens to a confused agitated state. Moving all four extremities with good strength.  HEENT:  [place]/AT, PERRL, no JVD Cardiovascular:  RRR, 2/6 SEM Lungs:  Clear bilateral breath sounds Abdomen:  Soft, non-tender, non-distended Musculoskeletal:  No acute deformity or ROM limitation Skin:  Grossly intact  LABS:  BMET Recent Labs  Lab 07/25/17 2127 07/26/17 0340  NA  --  132*  K  --  4.1  CL  --  94*  CO2  --  30  BUN  --  28*  CREATININE 0.80 0.73   GLUCOSE  --  221*    Electrolytes Recent Labs  Lab 07/26/17 0340  CALCIUM 8.6*    CBC Recent Labs  Lab 07/26/17 0340  WBC 12.5*  HGB 12.1*  HCT 36.4*  PLT 152    Coag's No results for input(s): APTT, INR in the last 168 hours.  Sepsis Markers No results for input(s): LATICACIDVEN, PROCALCITON, O2SATVEN in the last 168 hours.  ABG Recent Labs  Lab 07/27/17 0000  PHART 7.505*  PCO2ART 40.2  PO2ART 64.3*    Liver Enzymes Recent Labs  Lab 07/26/17 0340  AST 46*  ALT 58  ALKPHOS 245*  BILITOT 1.7*  ALBUMIN 2.2*    Cardiac Enzymes No results for input(s): TROPONINI, PROBNP in the last 168 hours.  Glucose Recent Labs  Lab 07/26/17 0353 07/26/17 0735 07/26/17 1212 07/26/17 1605 07/26/17 1955 07/26/17 2327  GLUCAP 210* 201* 185* 217* 299* 272*    Imaging Mr Brain Wo Contrast  Result Date: 07/26/2017 CLINICAL DATA:  Focal neuro deficit. MRSA bacteremia. Altered mental status EXAM: MRI HEAD WITHOUT CONTRAST TECHNIQUE: Multiplanar, multiecho pulse sequences of the brain and surrounding structures were obtained without intravenous contrast. COMPARISON:  CT head 07/26/2017 FINDINGS: Limited study. The patient not able to complete the study. Axial diffusion sagittal T1 images were obtained before study was terminated prematurely Multiple small areas of restricted diffusion are present in both cerebral hemispheres in occipital, parietal, and frontal lobes. Focal restricted diffusion right caudate. Small restricted diffusion right cerebellum. Ventricle size normal.  No midline shift. IMPRESSION: Incomplete study. 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 Consider septic emboli given the history of bacteremia. Electronically Signed   By: Marlan Palauharles  Clark M.D.   On: 07/26/2017 11:33     STUDIES:  CT head 12/5> unremarkable MRI brain 12/5 > multiple small areas of restricted diffusion in  the brain bilaterally most compatible with acute infarct from emboli.  CULTURES: [facility]3 blood cx 2 MRSA = bactrim S, vanco S, dapto S, linezolid, S, tetra  12/1  blood cx x 2 mrsa 11/29- blood cx x 2 mrsa -  [facility] BCID: staph>aureus > methicillin resistance.  BCx2 12/5 >>>  ANTIBIOTICS: Vancomycin 12/4 >  SIGNIFICANT EVENTS:   LINES/TUBES:   DISCUSSION: 56 year old male with MRSA bacteremia with mitral valve vegetation. Being treated with vancomycin and followed by ID. 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   ASSESSMENT / PLAN:  MRSA bacteremia - ID following - Vancomycin - Cultures pending - TEE scheduled for Friday 12/7 - Lumbar, thoracic MRI pending  - Will need CVTS consult at some point to evaluate for valve replacement candidacy.   Acute encephalopathy: likely multifactorial in the setting of ETOH abuse/withdrawal, septic  embolic CVA, bacteremia - Precedex infusion titrated to RASS 0 to -1 - Thiamine, Folate - NPO  Acute hypoxemic respiratory failure likely in the setting of pulmonary edema, also consider OSA. - Not currently a candidate for CPAP given encephalopathy, will DC - Continue scheduled lasix and follow chem panel  Embolic CVA - Neurology consultation in AM - Frequent neuro checks  Hypertension - PRN hydralazine  DM - CBG monitoring and SSI - Will add lantus as he remains hyperglycemic  AAA - follow up CT 1 year   Joneen Roach, AGACNP-BC [facility] Pulmonology/Critical Care Pager [phone] or [phone]  07/27/2017 3:14 AM

## 2017-07-27 NOTE — Progress Notes (Signed)
eLink Physician-Brief Progress Note Patient Name: Reginald [name] DOB: 04/03/1961 MRN: [id number]   Date of Service  07/27/2017  HPI/Events of Note  Camera check on patient with him agitated and combative. He is altered and thinks [place] is in [place]. Nurse administering Ativan 3mg  IV push per CIWA protocol.   eICU Interventions  1. Starting Precedex drip & titrating     Intervention Category Major Interventions: Delirium, psychosis, severe agitation - evaluation and management  Lawanda CousinsJennings Aarav Burgett 07/27/2017, 2:06 AM

## 2017-07-27 NOTE — Progress Notes (Signed)
PHARMACY - PHYSICIAN COMMUNICATION CRITICAL VALUE ALERT - BLOOD CULTURE IDENTIFICATION (BCID)  Results for orders placed or performed during the hospital encounter of 07/25/17  Blood Culture ID Panel (Reflexed) (Collected: 07/25/2017  9:14 PM)  Result Value Ref Range   Enterococcus species NOT DETECTED NOT DETECTED   Listeria monocytogenes NOT DETECTED NOT DETECTED   Staphylococcus species DETECTED (A) NOT DETECTED   Staphylococcus aureus DETECTED (A) NOT DETECTED   Methicillin resistance DETECTED (A) NOT DETECTED   Streptococcus species NOT DETECTED NOT DETECTED   Streptococcus agalactiae NOT DETECTED NOT DETECTED   Streptococcus pneumoniae NOT DETECTED NOT DETECTED   Streptococcus pyogenes NOT DETECTED NOT DETECTED   Acinetobacter baumannii NOT DETECTED NOT DETECTED   Enterobacteriaceae species NOT DETECTED NOT DETECTED   Enterobacter cloacae complex NOT DETECTED NOT DETECTED   Escherichia coli NOT DETECTED NOT DETECTED   Klebsiella oxytoca NOT DETECTED NOT DETECTED   Klebsiella pneumoniae NOT DETECTED NOT DETECTED   Proteus species NOT DETECTED NOT DETECTED   Serratia marcescens NOT DETECTED NOT DETECTED   Haemophilus influenzae NOT DETECTED NOT DETECTED   Neisseria meningitidis NOT DETECTED NOT DETECTED   Pseudomonas aeruginosa NOT DETECTED NOT DETECTED   Candida albicans NOT DETECTED NOT DETECTED   Candida glabrata NOT DETECTED NOT DETECTED   Candida krusei NOT DETECTED NOT DETECTED   Candida parapsilosis NOT DETECTED NOT DETECTED   Candida tropicalis NOT DETECTED NOT DETECTED    Name of physician (or Provider) Contacted: Donnamarie PoagK. Kirby  Changes to prescribed antibiotics required: No changes, already on vancomycin for prior MRSA bacteremia  Aleene DavidsonGrimsley Jr, Legacy Carrender Crowford 07/27/2017  5:06 AM

## 2017-07-27 NOTE — H&P (Signed)
   INTERVAL PROCEDURE H&P  History and Physical Interval Note:  07/27/2017 1:34 PM  Reginald [name] has presented today for their planned procedure. The various methods of treatment have been discussed with the patient and family. After consideration of risks, benefits and other options for treatment, the patient has consented to the procedure.  The patients' outpatient history has been reviewed, patient examined, and no change in status from most recent office note within the past 30 days. I have reviewed the patients' chart and labs and will proceed as planned. Questions were answered to the patient's satisfaction.   Reginald NoseKenneth Reginald. Der Gagliano, MD, [facility]ACC, FACP    [facility]  Medical Director of the [facility] &  Cardiovascular Risk Reduction Clinic Attending Cardiologist  Direct Dial: [phone]  Fax: [phone]  Website:  [email or web address]  Reginald [name] Reginald [name] 07/27/2017, 1:34 PM

## 2017-07-27 NOTE — Progress Notes (Signed)
PULMONARY / CRITICAL CARE MEDICINE   Name: Reginald [name] MRN: [id number] DOB: 1961-01-26    ADMISSION DATE:  07/25/2017 CONSULTATION DATE:  12/6  REFERRING MD:  Dr Lowell Guitar [facility]  CHIEF COMPLAINT:  agitation  HISTORY OF PRESENT ILLNESS:   56 year old male with past medical history as below, which is significant for COPD, diabetes mellitus, alcohol abuse, diastolic CHF, and AAA who was initially admitted to [facility] on 11/29 with nonspecific complaints of generalized weakness, low-grade fever, and muscle aches.  Blood cultures done at admission grew out MRSA and he was started on vancomycin.  2D echocardiogram at that time demonstrated a mitral valve vegetation.  He did require ICU transfer during his time at [facility] for Precedex infusion with presumed alcohol withdrawal was weaned off on 12/3.  The patient was transferred to [facility] for infectious diseases consultation.  On 12/4 he was found to have a new facial droop.  MRI was done and demonstrated multiple infarcts to both cerebral hemispheres consistent with septic emboli.  12/5 late p.m. he developed agitation which is refractory to repeated doses of Ativan.  He was started on Precedex infusion.  PCCM asked to see in consultation.   SUBJECTIVE:  Now intubated VITAL SIGNS: BP 100/61   Pulse 66   Temp 99.5 F (37.5 C) (Axillary)   Resp (!) 33   Ht 5\' 9"  (1.753 m)   Wt 257 lb 0.9 oz (116.6 kg)   SpO2 99%   BMI 37.96 kg/m   HEMODYNAMICS:    VENTILATOR SETTINGS: Vent Mode: PRVC FiO2 (%):  [100 %] 100 % Set Rate:  [16 bmp] 16 bmp Vt Set:  [500 mL] 500 mL PEEP:  [10 cmH20] 10 cmH20 Plateau Pressure:  [17 cmH20] 17 cmH20  INTAKE / OUTPUT: I/O last 3 completed shifts: In: 551.7 [I.V.:51.7; IV Piggyback:500] Out: 4600 [Urine:4600]  PHYSICAL EXAMINATION: General: 56 year old male currently sedated on the does withdraw to pain moves all extremities. HEENT: Normocephalic atraumatic orally intubated mucous  membranes moist. Pulmonary: Clear to auscultation without accessory muscle use. Cardiac: Regular rate and rhythm without murmur rub or gallop. Extremities/musculoskeletal: Warm, dry, brisk cap refill, no edema strong pulses. Abdomen: Soft, nontender, no organomegaly. Neuro: Localizes to pain, withdraws, moves all extremities.  LABS:  BMET Recent Labs  Lab 07/25/17 2127 07/26/17 0340 07/27/17 0322  NA  --  132* 134*  K  --  4.1 4.2  CL  --  94* 98*  CO2  --  30 30  BUN  --  28* 21*  CREATININE 0.80 0.73 0.61  GLUCOSE  --  221* 200*    Electrolytes Recent Labs  Lab 07/26/17 0340 07/27/17 0322  CALCIUM 8.6* 8.4*    CBC Recent Labs  Lab 07/26/17 0340 07/27/17 0322  WBC 12.5* 13.0*  HGB 12.1* 11.4*  HCT 36.4* 34.1*  PLT 152 156    Coag's No results for input(s): APTT, INR in the last 168 hours.  Sepsis Markers No results for input(s): LATICACIDVEN, PROCALCITON, O2SATVEN in the last 168 hours.  ABG Recent Labs  Lab 07/27/17 0000 07/27/17 0730  PHART 7.505* 7.363  PCO2ART 40.2 58.3*  PO2ART 64.3* 146*    Liver Enzymes Recent Labs  Lab 07/26/17 0340 07/27/17 0322  AST 46* 40  ALT 58 50  ALKPHOS 245* 217*  BILITOT 1.7* 2.0*  ALBUMIN 2.2* 2.0*    Cardiac Enzymes No results for input(s): TROPONINI, PROBNP in the last 168 hours.  Glucose Recent Labs  Lab 07/26/17 1212 07/26/17  1605 07/26/17 1955 07/26/17 2327 07/27/17 0350 07/27/17 0900  GLUCAP 185* 217* 299* 272* 209* 184*    Imaging Mr Brain Wo Contrast  Result Date: 07/26/2017 CLINICAL DATA:  Focal neuro deficit. MRSA bacteremia. Altered mental status EXAM: MRI HEAD WITHOUT CONTRAST TECHNIQUE: Multiplanar, multiecho pulse sequences of the brain and surrounding structures were obtained without intravenous contrast. COMPARISON:  CT head 07/26/2017 FINDINGS: Limited study. The patient not able to complete the study. Axial diffusion sagittal T1 images were obtained before study was  terminated prematurely Multiple small areas of restricted diffusion are present in both cerebral hemispheres in occipital, parietal, and frontal lobes. Focal restricted diffusion right caudate. Small restricted diffusion right cerebellum. Ventricle size normal.  No midline shift. IMPRESSION: Incomplete study. 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 Consider septic emboli given the history of bacteremia. Electronically Signed   By: Marlan Palauharles  Clark M.D.   On: 07/26/2017 11:33   Dg Chest Port 1 View  Result Date: 07/27/2017 CLINICAL DATA:  Endotracheal tube placement. EXAM: PORTABLE CHEST 1 VIEW COMPARISON:  07/25/2017 . FINDINGS: Endotracheal tube noted 10.3 cm above the carina at the thoracic inlet. Advancement of approximately 5 cm should be considered. Cardiomegaly with pulmonary vascular prominence, bilateral interstitial prominence, bilateral pleural effusions again noted. Findings consistent CHF. IMPRESSION: 1. Endotracheal tube noted 10.3 cm above the carina at the thoracic inlet. Advancement of approximately 5 cm should be considered. 2. Persistent cardiomegaly with bilateral from interstitial prominence of small pleural effusions consistent persistent CHF. Similar findings noted on prior exam. These results will be called to the ordering clinician or representative by the Radiologist Assistant, and communication documented in the PACS or zVision Dashboard. Electronically Signed   By: Maisie Fushomas  Register   On: 07/27/2017 09:00     STUDIES:  CT head 12/5> unremarkable MRI brain 12/5 > multiple small areas of restricted diffusion in the brain bilaterally most compatible with acute infarct from emboli.  CULTURES: [facility]/3 blood cx 2 MRSA = bactrim S, vanco S, dapto S, linezolid, S, tetra  12/1  blood cx x 2 mrsa 11/29- blood cx x 2 mrsa -  [facility] BCID: staph>aureus > methicillin resistance.  BCx2 12/5  >>>  ANTIBIOTICS: Vancomycin 12/4 >  SIGNIFICANT EVENTS:   LINES/TUBES: oett 12/6>>>  DISCUSSION: 56 year old male with MRSA bacteremia with mitral valve vegetation. Being treated with vancomycin and followed by ID. 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 Now intubated.   ASSESSMENT / PLAN:  MRSA bacteremia and mitral valve endocarditis -No history of IV drug abuse.  Source of nidus of infection not entirely clear Plan Continue IV vancomycin per infectious disease direction Follow-up pending cultures TEE scheduled 12/7 Needs lumbar/thoracic MRI We will see neuro input, as well as how the next few days ago regarding appropriateness of thoracic surgery referral  Acute encephalopathy: likely multifactorial in the setting of ETOH abuse/withdrawal, septic embolic CVA, bacteremia Plan Neuro consult PAD protocol with RASS goal -2 Continue thiamine and folate Consider EEG  Acute hypoxemic respiratory failure in setting of pulmonary edema and ineffective airway protection -Intubated 12/6 -Chest x-ray review ET tube high, chest x-ray consistent with pulmonary edema -Cannot exclude aspiration event Plan Full ventilator support PAD protocol Follow-up ABG Follow-up a.m. chest x-ray  Embolic CVA Plan cont serial neuro checks neuro consultation requested  No asa Cont routine supportive care  Hypertension Plan PRN  hydralazine  DM with hyperglycemia Plan -cont ssi   AAA Plan - f/u CT 1 yr  DVT prophylaxis: LMWH SUP: PPI  Diet: tubefeeds Activity: BR Disposition : ICU  Simonne MartinetPeter E Deangleo Passage ACNP-BC [facility] Pulmonary/Critical Care Pager # [phone] OR # [phone] if no answer  07/27/2017 10:28 AM

## 2017-07-28 ENCOUNTER — Inpatient Hospital Stay (HOSPITAL_COMMUNITY): Payer: Medicaid Other

## 2017-07-28 ENCOUNTER — Encounter (HOSPITAL_COMMUNITY)
Admission: AD | Disposition: A | Discharge status: Rehabilitation Facility | Payer: Self-pay | Source: Other Acute Inpatient Hospital | Attending: Internal Medicine

## 2017-07-28 DIAGNOSIS — I5032 Chronic diastolic (congestive) heart failure: Secondary | ICD-10-CM

## 2017-07-28 DIAGNOSIS — J9600 Acute respiratory failure, unspecified whether with hypoxia or hypercapnia: Secondary | ICD-10-CM

## 2017-07-28 LAB — COMPREHENSIVE METABOLIC PANEL
ALT: 39 U/L (ref 17–63)
ANION GAP: 6 (ref 5–15)
AST: 30 U/L (ref 15–41)
Albumin: 1.9 g/dL — ABNORMAL LOW (ref 3.5–5.0)
Alkaline Phosphatase: 187 U/L — ABNORMAL HIGH (ref 38–126)
BUN: 38 mg/dL — ABNORMAL HIGH (ref 6–20)
CHLORIDE: 101 mmol/L (ref 101–111)
CO2: 30 mmol/L (ref 22–32)
Calcium: 8.4 mg/dL — ABNORMAL LOW (ref 8.9–10.3)
Creatinine, Ser: 0.81 mg/dL (ref 0.61–1.24)
GFR calc non Af Amer: >60 mL/min (ref >60)
Glucose, Bld: 207 mg/dL — ABNORMAL HIGH (ref 65–99)
POTASSIUM: 4 mmol/L (ref 3.5–5.1)
SODIUM: 137 mmol/L (ref 135–145)
Total Bilirubin: 1.7 mg/dL — ABNORMAL HIGH (ref 0.3–1.2)
Total Protein: 6.8 g/dL (ref 6.5–8.1)

## 2017-07-28 LAB — BLOOD GAS, ARTERIAL
ACID-BASE EXCESS: 5.9 mmol/L — AB (ref 0.0–2.0)
BICARBONATE: 30.9 mmol/L — AB (ref 20.0–28.0)
Drawn by: 232811
FIO2: 40
LHR: 16 {breaths}/min
O2 Saturation: 98 %
PATIENT TEMPERATURE: 98.6
PCO2 ART: 49.2 mmHg — AB (ref 32.0–48.0)
PEEP/CPAP: 10 cmH2O
PO2 ART: 112 mmHg — AB (ref 83.0–108.0)
VT: 560 mL
pH, Arterial: 7.415 (ref 7.350–7.450)

## 2017-07-28 LAB — URINALYSIS, ROUTINE W REFLEX MICROSCOPIC
Bilirubin Urine: NEGATIVE
GLUCOSE, UA: NEGATIVE mg/dL
Hgb urine dipstick: NEGATIVE
KETONES UR: NEGATIVE mg/dL
LEUKOCYTES UA: NEGATIVE
Nitrite: NEGATIVE
PH: 5 (ref 5.0–8.0)
Protein, ur: NEGATIVE mg/dL
SPECIFIC GRAVITY, URINE: 1.01 (ref 1.005–1.030)

## 2017-07-28 LAB — MAGNESIUM
MAGNESIUM: 2.1 mg/dL (ref 1.7–2.4)
MAGNESIUM: 2 mg/dL (ref 1.7–2.4)

## 2017-07-28 LAB — GLUCOSE, CAPILLARY
GLUCOSE-CAPILLARY: 184 mg/dL — AB (ref 65–99)
GLUCOSE-CAPILLARY: 243 mg/dL — AB (ref 65–99)
GLUCOSE-CAPILLARY: 186 mg/dL — AB (ref 65–99)
GLUCOSE-CAPILLARY: 172 mg/dL — AB (ref 65–99)

## 2017-07-28 LAB — CULTURE, BLOOD (ROUTINE X 2): SPECIAL REQUESTS: ADEQUATE

## 2017-07-28 LAB — PHOSPHORUS
PHOSPHORUS: 4.6 mg/dL (ref 2.5–4.6)
Phosphorus: 4.8 mg/dL — ABNORMAL HIGH (ref 2.5–4.6)

## 2017-07-28 LAB — VANCOMYCIN, PEAK: VANCOMYCIN PK: 42 ug/mL — AB (ref 30–40)

## 2017-07-28 SURGERY — ECHOCARDIOGRAM, TRANSESOPHAGEAL
Anesthesia: Monitor Anesthesia Care

## 2017-07-28 MED ORDER — IOPAMIDOL (ISOVUE-370) INJECTION 76%
INTRAVENOUS | Status: AC
  Filled 2017-07-28: qty 100

## 2017-07-28 MED ORDER — POTASSIUM CHLORIDE 20 MEQ/15ML (10%) PO SOLN
40.0000 meq | Freq: Once | ORAL | Status: AC
  Administered 2017-07-28: 40 meq
  Filled 2017-07-28: qty 30

## 2017-07-28 MED ORDER — INSULIN ASPART 100 UNIT/ML ~~LOC~~ SOLN
6.0000 [IU] | SUBCUTANEOUS | Status: DC
  Administered 2017-07-28 – 2017-07-31 (×17): 6 [IU] via SUBCUTANEOUS

## 2017-07-28 MED ORDER — PANTOPRAZOLE SODIUM 40 MG IV SOLR
40.0000 mg | Freq: Every day | INTRAVENOUS | Status: DC
  Administered 2017-07-28 – 2017-07-30 (×4): 40 mg via INTRAVENOUS
  Filled 2017-07-28 (×4): qty 40

## 2017-07-28 MED ORDER — FENTANYL CITRATE (PF) 100 MCG/2ML IJ SOLN
100.0000 ug | INTRAMUSCULAR | Status: AC | PRN
  Administered 2017-07-28 – 2017-07-29 (×3): 100 ug via INTRAVENOUS
  Filled 2017-07-28 (×5): qty 2

## 2017-07-28 MED ORDER — IOPAMIDOL (ISOVUE-370) INJECTION 76%
100.0000 mL | Freq: Once | INTRAVENOUS | Status: AC | PRN
  Administered 2017-07-28: 100 mL via INTRAVENOUS

## 2017-07-28 MED ORDER — FENTANYL CITRATE (PF) 100 MCG/2ML IJ SOLN
100.0000 ug | INTRAMUSCULAR | Status: DC | PRN
  Administered 2017-07-28 – 2017-07-29 (×6): 100 ug via INTRAVENOUS
  Filled 2017-07-28 (×4): qty 2

## 2017-07-28 NOTE — Progress Notes (Signed)
Pharmacy Antibiotic Note  Reginald [name] is a 56 y.o. male admitted on 07/25/2017 with MRSA bacteremia and endocarditis.  Pharmacy has been consulted for Vancomycin dosing.  Plan:  Continue Vancomycin 1500 mg IV q12h.  Measure Vanc levels at steady state - peak/trough levels are ordered.  Follow up renal fxn, culture results, and clinical course.   Height: 5\' 9"  (175.3 cm) Weight: 259 lb 4.2 oz (117.6 kg) IBW/kg (Calculated) : 70.7  Temp (24hrs), Avg:99 F (37.2 C), Min:98.4 F (36.9 C), Max:99.7 F (37.6 C)  Recent Labs  Lab 07/25/17 2127 07/26/17 0340 07/26/17 1158 07/27/17 0322 07/28/17 0501  WBC  --  12.5*  --  13.0*  --   CREATININE 0.80 0.73  --  0.61 0.81  VANCOTROUGH  --   --  15  --   --     Estimated Creatinine Clearance: 127.4 mL/min (by C-G formula based on SCr of 0.81 mg/dL).    Allergies  Allergen Reactions  . Morphine And Related     Antimicrobials this admission: 12/4 [facility]anc [facility] >>  Dose adjustments this admission: 12/7 1800 Vancomycin peak level:  12/8 0230 Vancomycin trough level:   Microbiology results: [facility]29[facility] BCx: MRSA (S bactrim, vanc, dapto, linezolid, TCN) - per Dr. Feliz BeamSnider's note 12/4 BCx: GPC (BCID = MRSA) 12/5 HIV Ab: negative 12/6 MRSA PCR: positive 12/7 BCx: collected  Thank you for allowing pharmacy to be a part of this patient's care.  Lynann Beaverhristine Loney Peto PharmD, BCPS Pager [phone] 07/28/2017 9:54 AM

## 2017-07-28 NOTE — Progress Notes (Signed)
Nutrition Follow-up  DOCUMENTATION CODES:   Obesity unspecified  INTERVENTION:  - Continue Vital High Protein @ 20 mL/hr with 7 packets of Prostat/day.   NUTRITION DIAGNOSIS:   Inadequate oral intake related to inability to eat as evidenced by NPO status. -ongoing  GOAL:   Provide needs based on ASPEN/SCCM guidelines -met with current TF regimen.   MONITOR:   Vent status, TF tolerance, Weight trends, Labs, I & O's  ASSESSMENT:   56 year old male with past medical history significant for COPD, DM, alcohol abuse, diastolic CHF, and AAA who was initially admitted to [facility] on 11/29 with nonspecific complaints of generalized weakness, low-grade fever, and muscle aches. 2D echocardiogram at that time demonstrated a mitral valve vegetation. He required ICU transfer during his time at [facility] for Precedex infusion with presumed alcohol withdrawal, was weaned off on 12/3.  The patient was transferred to [facility] for infectious diseases consultation.  On 12/4 he was found to have a new facial droop.  MRI was done and demonstrated multiple infarcts to both cerebral hemispheres consistent with septic emboli.  12/5 late p.m. he developed agitation which is refractory to repeated doses of Ativan. He was started on Precedex infusion.   12/7 Pt remains intubated and OGT now in place. Imaging report from yesterday states tip of tube is in the proximal stomach. Pt receiving Vital High Protein @ 20 mL/hr with 7 packets of Prostat/day. This regimen is providing 1180 kcal (92% minimum estimated kcal need), 147 grams of protein, and 401 mL free water. Will continue to monitor weight and lab trends closely. Per PCCM NP note from this AM, plan for CT and then transition from Propofol to Precedex after that time versus tomorrow AM. Due to this plan, will not take Propofol into account concerning TF regimen.   Patient is currently intubated on ventilator support MV: 10.5 L/min Temp  (24hrs), Avg:98.9 F (37.2 C), Min:98.4 F (36.9 C), Max:99.7 F (37.6 C) Propofol: 35 ml/hr (924 kcal) BP: 132/77 and MAP: 91  Medications reviewed; 1 mg IV folic acid/day, 40 mg IV Lasix BID, sliding scale Novolog, 6 units Novolog every 4 hours, 40 mg IV Protonix/day, 40 mEq KCl per OGT x1 dose today, 100 mg thiamine/day, 500 mg IV thiamine TID x9 doses starting 12/6 at 0200. Labs reviewed; CBGs: 184 and 243 mg/dL this AM, BUN: 38 mg/dL, Ca: 8.4 mg/dL, Alk Phos elevated.  IVF: NS @ 20 mL/hr.      12/6 - Noted weight -7 lbs/3.6 kg compared to admission weight. - No OGT/NGT in place at this time; talked with RN who reports plan for placement following TEE.  - Several family members at bedside at time of RD visit and visibly emotional.   Patient is currently intubated on ventilator support MV: 12.9 L/min Temp (24hrs), Avg:99.4 F (37.4 C), Min:98.5 F (36.9 C), Max:100.5 F (38.1 C) Propofol: 17.5 ml/hr (462 kcal) BP: 119/71 and MAP: 83 Drip: Propofol @ 25 mcg/kr/min.     Diet Order:  Diet NPO time specified  EDUCATION NEEDS:   No education needs have been identified at this time  Skin:  Skin Assessment: Reviewed RN Assessment  Last BM:  PTA/unknown  Height:   Ht Readings from Last 1 Encounters:  07/27/17 '5\' 9"'$  (1.753 m)    Weight:   Wt Readings from Last 1 Encounters:  07/28/17 259 lb 4.2 oz (117.6 kg)    Ideal Body Weight:  72.73 kg  BMI:  Body mass index is 38.29  kg/m.  Estimated Nutritional Needs:   Kcal:  7517-0017 (11-14 kcal/kg)  Protein:  145 grams  Fluid:  >/= 1.7 L/day      Jarome Matin, MS, RD, LDN, [facility] Inpatient Clinical Dietitian Pager # [phone] After hours/weekend pager # [phone]

## 2017-07-28 NOTE — Progress Notes (Signed)
Inpatient Diabetes Program Recommendations  AACE/ADA: New Consensus Statement on Inpatient Glycemic Control (2015)  Target Ranges:  Prepandial:   less than 140 mg/dL      Peak postprandial:   less than 180 mg/dL (1-2 hours)      Critically ill patients:  140 - 180 mg/dL   Results for Reginald [name], Warnell (MRN [id number]) as of 07/28/2017 08:24  Ref. Range 07/27/2017 09:00 07/27/2017 12:02 07/27/2017 16:23 07/27/2017 19:22 07/27/2017 23:26 07/28/2017 03:38 07/28/2017 08:06  Glucose-Capillary Latest Ref Range: 65 - 99 mg/dL 409184 (H) 811162 (H) 914160 (H) 150 (H) 169 (H) 184 (H) 243 (H)   Review of Glycemic Control  Diabetes history: DM 2 Outpatient Diabetes medications: Metformin 500 mg BID, Glipizide 10 mg QD Current orders for Inpatient glycemic control: Novolog Resistant 0-20 units Q4 hours, Novolog 3 units Q4 hours Tube Feed Coverage  Inpatient Diabetes Program Recommendations:    Patient received one time dose of Lantus 7 units yesterday glucose range in the 100's at goal. Tube Feed Coverage also added, Novolog 3 units Q4 hours.  Glucose 243 this am. Please consider reordering Lantus 7 units   Thanks,  Christena DeemShannon Tavon Corriher RN, MSN, [facility] Inpatient Diabetes Coordinator Team Pager [phone] (8a-5p)

## 2017-07-28 NOTE — Progress Notes (Addendum)
[name] Infectious Disease    Date of Admission:  07/25/2017   Total days of antibiotics 3        Day 3 vancomycin           ID: TRUE Reginald [name] is a 56 y.o. male with mrsa MV endocarditis c/b resp failure, CNS septic emboli Principal Problem:   MRSA bacteremia Active Problems:   Mitral valve vegetation   Acute respiratory failure (HCC)   Alcohol abuse   Thrombocytopenia (HCC)   Abnormal liver enzymes   Acute encephalopathy   Acute diastolic CHF (congestive heart failure) (HCC)   Sepsis ([place])   Endocarditis of mitral valve   Cerebral septic emboli (HCC)    Subjective: Remains on ventilator, afebrile, still requiring sedation - FiO2 down to 40%.  Moves all extremities  Medications:  . chlorhexidine gluconate (MEDLINE KIT)  15 mL Mouth Rinse BID  . Chlorhexidine Gluconate Cloth  6 each Topical Q0600  . enoxaparin (LOVENOX) injection  60 mg Subcutaneous Q24H  . feeding supplement (PRO-STAT SUGAR FREE 64)  30 mL Per Tube Daily  . feeding supplement (PRO-STAT SUGAR FREE 64)  60 mL Per Tube TID  . feeding supplement (VITAL HIGH PROTEIN)  1,000 mL Per Tube Q24H  . folic acid  1 mg Intravenous Daily  . furosemide  40 mg Intravenous BID  . insulin aspart  0-20 Units Subcutaneous Q4H  . insulin aspart  6 Units Subcutaneous Q4H  . iopamidol      . mouth rinse  15 mL Mouth Rinse QID  . mupirocin ointment  1 application Nasal BID  . pantoprazole (PROTONIX) IV  40 mg Intravenous QHS  . [START ON 07/30/2017] thiamine  100 mg Intravenous Daily   Or  . [START ON 07/30/2017] thiamine  100 mg Oral Daily    Objective: Vital signs in last 24 hours: Temp:  [98.4 F (36.9 C)-99.7 F (37.6 C)] 98.4 F (36.9 C) (12/07 1200) Pulse Rate:  [67-80] 71 (12/07 1217) Resp:  [14-30] 20 (12/07 1217) BP: (104-139)/(56-77) 132/73 (12/07 1217) SpO2:  [95 %-100 %] 98 % (12/07 1217) FiO2 (%):  [40 %-50 %] 40 % (12/07 1145) Weight:  [259 lb 4.2 oz (117.6 kg)] 259 lb 4.2 oz (117.6  kg) (12/07 0500) Physical Exam  Constitutional: sedated. intubated He appears well-developed and well-nourished. No distress.  HENT:  Mouth/Throat: OETT in place.  Cardiovascular: Normal rate, regular rhythm and normal heart sounds. 2/6 SEM Pulmonary/Chest: Effort normal and breath sounds normal. No respiratory distress. He has no wheezes.  Abdominal: Soft. Bowel sounds are normal. He exhibits no distension. There is no tenderness.  Neurological: moves legs spontaneously,  Skin: Stigmata of septic emboli to hands 3 lesions on left hand, 1 lesion on 5th digit of right hand    Lab Results Recent Labs    07/26/17 0340 07/27/17 0322 07/28/17 0501  WBC 12.5* 13.0*  --   HGB 12.1* 11.4*  --   HCT 36.4* 34.1*  --   NA 132* 134* 137  K 4.1 4.2 4.0  CL 94* 98* 101  CO2 '30 30 30  '$ BUN 28* 21* 38*  CREATININE 0.73 0.61 0.81   Liver Panel Recent Labs    07/27/17 0322 07/28/17 0501  PROT 6.7 6.8  ALBUMIN 2.0* 1.9*  AST 40 30  ALT 50 39  ALKPHOS 217* 187*  BILITOT 2.0* 1.7*    Microbiology: 12/7 blood cx pending 12/4 blood cx MRSA Studies/Results: Ct Angio Head W Or  Wo Contrast  Result Date: 07/28/2017 CLINICAL DATA:  New onset facial droop. Sepsis. Multiple small infarctions by MRI 2 days ago. Septic emboli suspected. EXAM: CT ANGIOGRAPHY HEAD AND NECK TECHNIQUE: Multidetector CT imaging of the head and neck was performed using the standard protocol during bolus administration of intravenous contrast. Multiplanar CT image reconstructions and MIPs were obtained to evaluate the vascular anatomy. Carotid stenosis measurements (when applicable) are obtained utilizing NASCET criteria, using the distal internal carotid diameter as the denominator. CONTRAST:  119m ISOVUE-370 IOPAMIDOL (ISOVUE-370) INJECTION 76% COMPARISON:  CT and MR exams 07/26/2017 FINDINGS: CT HEAD FINDINGS Brain: Generalized atrophy. Small infarctions seen previously cannot be specifically resolved. No evidence of  large acute infarction, mass lesion, hemorrhage, hydrocephalus or extra-axial collection. Physiologic basal ganglia calcification. Old white matter infarction left forceps major. Vascular: There is atherosclerotic calcification of the major vessels at the base of the brain. Skull: Negative Sinuses: No acute sinusitis. Chronic mucoperiosteal thickening of the maxillary sinuses. Orbits: Negative Review of the MIP images confirms the above findings CTA NECK FINDINGS Aortic arch: Aortic atherosclerosis.  No aneurysm or dissection. Right carotid system: Common carotid artery shows atherosclerotic disease but no flow limiting stenosis. Stenosis of 30% in the midportion. Advanced calcified plaque at the carotid bifurcation and proximal ICA. Minimal diameter is 5 mm, therefore there is no stenosis. Cervical ICA is widely patent. Focal calcified plaque just beneath the skullbase but with narrowing of only 20%. Left carotid system: Common carotid artery shows atherosclerotic plaque but without narrowing of more than 20%. Complex calcified plaque at the carotid bifurcation and proximal ICA. Minimal proximal ICA diameter 3.2 mm. Compared to a more distal cervical ICA diameter of 4 mm, this indicates a 20% stenosis. Vertebral arteries: Both vertebral artery origins are patent. Both vertebral arteries are patent through the cervical region. The right vertebral artery is a very small vessel. Skeleton: Ordinary cervical spondylosis. Other neck: No mass or lymphadenopathy. Upper chest: Areas of patchy pulmonary opacity. Layering pleural effusions. Findings could represent infiltrate or ARDS related to sepsis. Review of the MIP images confirms the above findings CTA HEAD FINDINGS Anterior circulation: Both internal carotid arteries are patent through the skullbase and siphon regions. No siphon stenosis. The anterior and middle cerebral vessels are patent without proximal stenosis, aneurysm or vascular malformation. No missing branch  vessels are identified. Posterior circulation: Both vertebral arteries are patent to the basilar. The left vertebral artery is dominant. No basilar stenosis. Posterior circulation branch vessels are patent. Posterior cerebral arteries receive most of there supply from the anterior circulation. Venous sinuses: Patent and normal Anatomic variants: None significant. Probable venous angioma right posterior parietal brain. Delayed phase: No abnormal enhancement Review of the MIP images confirms the above findings IMPRESSION: No large or medium vessel intracranial stenosis or occlusion. 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 Probable venous angioma right posterior parietal region incidentally noted. Atherosclerotic disease at both carotid bifurcation regions. No measurable stenosis on the right. 20% stenosis of the proximal ICA on the left. Though there is no flow limiting stenosis, irregular plaque could possibly serve as a source of emboli. However, 1 would not expect embolic disease throughout all vascular territories as shown on the previous MRI. Therefore, most likely source would be the heart or ascending aorta. Electronically Signed   By: MNelson ChimesM.D.   On: 07/28/2017 11:19   Ct Angio Neck W Or Wo Contrast  Result Date: 07/28/2017 CLINICAL DATA:  New onset facial  droop. Sepsis. Multiple small infarctions by MRI 2 days ago. Septic emboli suspected. EXAM: CT ANGIOGRAPHY HEAD AND NECK TECHNIQUE: Multidetector CT imaging of the head and neck was performed using the standard protocol during bolus administration of intravenous contrast. Multiplanar CT image reconstructions and MIPs were obtained to evaluate the vascular anatomy. Carotid stenosis measurements (when applicable) are obtained utilizing NASCET criteria, using the distal internal carotid diameter as the denominator. CONTRAST:  160m ISOVUE-370 IOPAMIDOL (ISOVUE-370) INJECTION 76% COMPARISON:  CT and MR  exams 07/26/2017 FINDINGS: CT HEAD FINDINGS Brain: Generalized atrophy. Small infarctions seen previously cannot be specifically resolved. No evidence of large acute infarction, mass lesion, hemorrhage, hydrocephalus or extra-axial collection. Physiologic basal ganglia calcification. Old white matter infarction left forceps major. Vascular: There is atherosclerotic calcification of the major vessels at the base of the brain. Skull: Negative Sinuses: No acute sinusitis. Chronic mucoperiosteal thickening of the maxillary sinuses. Orbits: Negative Review of the MIP images confirms the above findings CTA NECK FINDINGS Aortic arch: Aortic atherosclerosis.  No aneurysm or dissection. Right carotid system: Common carotid artery shows atherosclerotic disease but no flow limiting stenosis. Stenosis of 30% in the midportion. Advanced calcified plaque at the carotid bifurcation and proximal ICA. Minimal diameter is 5 mm, therefore there is no stenosis. Cervical ICA is widely patent. Focal calcified plaque just beneath the skullbase but with narrowing of only 20%. Left carotid system: Common carotid artery shows atherosclerotic plaque but without narrowing of more than 20%. Complex calcified plaque at the carotid bifurcation and proximal ICA. Minimal proximal ICA diameter 3.2 mm. Compared to a more distal cervical ICA diameter of 4 mm, this indicates a 20% stenosis. Vertebral arteries: Both vertebral artery origins are patent. Both vertebral arteries are patent through the cervical region. The right vertebral artery is a very small vessel. Skeleton: Ordinary cervical spondylosis. Other neck: No mass or lymphadenopathy. Upper chest: Areas of patchy pulmonary opacity. Layering pleural effusions. Findings could represent infiltrate or ARDS related to sepsis. Review of the MIP images confirms the above findings CTA HEAD FINDINGS Anterior circulation: Both internal carotid arteries are patent through the skullbase and siphon  regions. No siphon stenosis. The anterior and middle cerebral vessels are patent without proximal stenosis, aneurysm or vascular malformation. No missing branch vessels are identified. Posterior circulation: Both vertebral arteries are patent to the basilar. The left vertebral artery is dominant. No basilar stenosis. Posterior circulation branch vessels are patent. Posterior cerebral arteries receive most of there supply from the anterior circulation. Venous sinuses: Patent and normal Anatomic variants: None significant. Probable venous angioma right posterior parietal brain. Delayed phase: No abnormal enhancement Review of the MIP images confirms the above findings IMPRESSION: No large or medium vessel intracranial stenosis or occlusion. 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 Probable venous angioma right posterior parietal region incidentally noted. Atherosclerotic disease at both carotid bifurcation regions. No measurable stenosis on the right. 20% stenosis of the proximal ICA on the left. Though there is no flow limiting stenosis, irregular plaque could possibly serve as a source of emboli. However, 1 would not expect embolic disease throughout all vascular territories as shown on the previous MRI. Therefore, most likely source would be the heart or ascending aorta. Electronically Signed   By: MNelson ChimesM.D.   On: 07/28/2017 11:19   Dg Chest Port 1 View  Result Date: 07/28/2017 CLINICAL DATA:  Acute respiratory failure. EXAM: PORTABLE CHEST 1 VIEW COMPARISON:  Radiograph July 27, 2017. FINDINGS: Stable cardiomegaly.  Endotracheal and nasogastric tubes are unchanged in position. No definite pneumothorax is noted. Right lung is clear. Left basilar opacity is noted concerning for atelectasis or pneumonia with mild associated pleural effusion. Old left rib fractures are noted. IMPRESSION: Stable support apparatus. Left basilar atelectasis or pneumonia is noted  with mild associated pleural effusion. Electronically Signed   By: Marijo Conception, M.D.   On: 07/28/2017 07:47   Dg Chest Port 1 View  Result Date: 07/27/2017 CLINICAL DATA:  Hypoxia EXAM: PORTABLE CHEST 1 VIEW COMPARISON:  July 27, 2017 study obtained earlier in the day FINDINGS: Endotracheal tube tip is 5.1 cm above the carina. Nasogastric tube tip and side port are below the diaphragm. No pneumothorax. There is atelectatic change in the left base with small left pleural effusion. The lungs elsewhere are clear. Heart is mildly enlarged with pulmonary vascularity within normal limits. No adenopathy. There is aortic atherosclerosis. No evident bone lesions. IMPRESSION: Tube positions as described without pneumothorax. Left base atelectasis with small left pleural effusion. No edema or consolidation evident. Heart mildly enlarged but stable. There is aortic atherosclerosis. Aortic Atherosclerosis (ICD10-I70.0). Electronically Signed   By: Lowella Grip III M.D.   On: 07/27/2017 15:38   Dg Chest Port 1 View  Result Date: 07/27/2017 CLINICAL DATA:  Endotracheal tube placement. EXAM: PORTABLE CHEST 1 VIEW COMPARISON:  07/25/2017 . FINDINGS: Endotracheal tube noted 10.3 cm above the carina at the thoracic inlet. Advancement of approximately 5 cm should be considered. Cardiomegaly with pulmonary vascular prominence, bilateral interstitial prominence, bilateral pleural effusions again noted. Findings consistent CHF. IMPRESSION: 1. Endotracheal tube noted 10.3 cm above the carina at the thoracic inlet. Advancement of approximately 5 cm should be considered. 2. Persistent cardiomegaly with bilateral from interstitial prominence of small pleural effusions consistent persistent CHF. Similar findings noted on prior exam. These results will be called to the ordering clinician or representative by the Radiologist Assistant, and communication documented in the PACS or zVision Dashboard. Electronically Signed   By:  Marcello Moores  Register   On: 07/27/2017 09:00   Dg Abd Portable 1v  Result Date: 07/27/2017 CLINICAL DATA:  Assess nasogastric tube. EXAM: PORTABLE ABDOMEN - 1 VIEW COMPARISON:  CT abdomen and pelvis August 18, 2013 FINDINGS: Nasogastric tube tip and side-port project in proximal stomach. Included bowel gas pattern is nondilated and nonobstructive. Abdominal herniorrhaphy. No intra-abdominal mass effect or pathologic calcifications. Retrocardiac consolidation. Soft tissue planes and included osseous structures are nonacute. IMPRESSION: Nasogastric tube tip projects in proximal stomach. Electronically Signed   By: Elon Alas M.D.   On: 07/27/2017 15:50     Assessment/Plan: MRSA endocarditis = continue on vancomycin. Continue to follow vanco levels to ensure to avoid nephrotoxicity. Given his prolonged bacteremia, once he stabilizes, would recommend to get mri of spine. Appreciate dr PVT's input and assessment on endocarditis and potential for valve replacement if patient clinically improves  CNS emboli = patient undergoing CTA to see if any signs of hemorrhage conversion.  Respiratory failure = likely multifactorial, continue per pulmonary critical care  Health maintenance = will check for hep c ab   Baxter Flattery [facility] for Infectious Diseases Cell: [phone] Pager: [phone]  07/28/2017, 1:52 PM

## 2017-07-28 NOTE — Progress Notes (Signed)
PULMONARY / CRITICAL CARE MEDICINE   Name: Reginald [name] MRN: [id number] DOB: 02/04/1961    ADMISSION DATE:  07/25/2017 CONSULTATION DATE:  12/6  REFERRING MD:  Dr [facility]  CHIEF COMPLAINT:  agitation  HISTORY OF PRESENT ILLNESS:   56 year old male with past medical history as below, which is significant for COPD, diabetes mellitus, alcohol abuse, diastolic CHF, and AAA who was initially admitted to [facility] on 11/29 with nonspecific complaints of generalized weakness, low-grade fever, and muscle aches.  Blood cultures done at admission grew out MRSA and he was started on vancomycin.  2D echocardiogram at that time demonstrated a mitral valve vegetation.  He did require ICU transfer [facility] Precedex infusion with presumed alcohol withdrawal was weaned off on 12/3.  [facility] infectious diseases consultation.  On 12/4 he was found to have a new facial droop.  MRI was done and demonstrated multiple infarcts to both cerebral hemispheres consistent with septic emboli.  12/5 late p.m. he developed agitation which is refractory to repeated doses of Ativan.  He was started on Precedex infusion.  PCCM asked to see in consultation.   SUBJECTIVE:  sedated VITAL SIGNS: BP (!) 124/59   Pulse 70   Temp 98.4 F (36.9 C) (Oral)   Resp (!) 22   Ht 5\' 9"  (1.753 m)   Wt 259 lb 4.2 oz (117.6 kg)   SpO2 100%   BMI 38.29 kg/m   HEMODYNAMICS:    VENTILATOR SETTINGS: Vent Mode: PRVC FiO2 (%):  [40 %-50 %] 40 % Set Rate:  [16 bmp] 16 bmp Vt Set:  [500 mL-560 mL] 560 mL PEEP:  [10 cmH20] 10 cmH20 Plateau Pressure:  [19 cmH20-23 cmH20] 23 cmH20  INTAKE / OUTPUT:  Intake/Output Summary (Last 24 hours) at 07/28/2017 [id number] Last data filed at 07/28/2017 0600 Gross per 24 hour  Intake 1203.47 ml  Output 3100 ml  Net -1896.53 ml     PHYSICAL EXAMINATION: General: 56 year old male, sedated on vent.  HEENT: NCAT,  MMM, orally intubated  Pulmonary: coarse scattered rhonchi, equal chest rise. Decreased BS left base  Cardiac: RRR w/out MRG Extremities/musculoskeletal: warm and dry, brisk CR Abdomen: soft, not tender + bs  Neuro: restless. Moving all extremities. W/d to pain  LABS:  BMET Recent Labs  Lab 07/26/17 0340 07/27/17 0322 07/28/17 0501  NA 132* 134* 137  K 4.1 4.2 4.0  CL 94* 98* 101  CO2 30 30 30   BUN 28* 21* 38*  CREATININE 0.73 0.61 0.81  GLUCOSE 221* 200* 207*    Electrolytes Recent Labs  Lab 07/26/17 0340 07/27/17 0322 07/27/17 1113 07/27/17 1701 07/28/17 0501  CALCIUM 8.6* 8.4*  --   --  8.4*  MG  --   --  2.0 1.9 2.1  PHOS  --   --  5.1* 5.3* 4.8*    CBC Recent Labs  Lab 07/26/17 0340 07/27/17 0322  WBC 12.5* 13.0*  HGB 12.1* 11.4*  HCT 36.4* 34.1*  PLT 152 156    Coag's No results for input(s): APTT, INR in the last 168 hours.  Sepsis Markers No results for input(s): LATICACIDVEN, PROCALCITON, O2SATVEN in the last 168 hours.  ABG Recent Labs  Lab 07/27/17 0730 07/27/17 1120 07/28/17 0330  PHART 7.363 7.397 7.415  PCO2ART 58.3* 53.5* 49.2*  PO2ART 146* 332* 112*    Liver Enzymes Recent Labs  Lab 07/26/17 0340 07/27/17 0322 07/28/17 0501  AST  46* 40 30  ALT 58 50 39  ALKPHOS 245* 217* 187*  BILITOT 1.7* 2.0* 1.7*  ALBUMIN 2.2* 2.0* 1.9*    Cardiac Enzymes No results for input(s): TROPONINI, PROBNP in the last 168 hours.  Glucose Recent Labs  Lab 07/27/17 1202 07/27/17 1623 07/27/17 1922 07/27/17 2326 07/28/17 0338 07/28/17 0806  GLUCAP 162* 160* 150* 169* 184* 243*    Imaging Dg Chest Port 1 View  Result Date: 07/28/2017 CLINICAL DATA:  Acute respiratory failure. EXAM: PORTABLE CHEST 1 VIEW COMPARISON:  Radiograph July 27, 2017. FINDINGS: Stable cardiomegaly. Endotracheal and nasogastric tubes are unchanged in position. No definite pneumothorax is noted. Right lung is clear. Left basilar opacity is noted concerning  for atelectasis or pneumonia with mild associated pleural effusion. Old left rib fractures are noted. IMPRESSION: Stable support apparatus. Left basilar atelectasis or pneumonia is noted with mild associated pleural effusion. Electronically Signed   By: Lupita RaiderJames  Green Jr, M.D.   On: 07/28/2017 07:47   Dg Chest Port 1 View  Result Date: 07/27/2017 CLINICAL DATA:  Hypoxia EXAM: PORTABLE CHEST 1 VIEW COMPARISON:  July 27, 2017 study obtained earlier in the day FINDINGS: Endotracheal tube tip is 5.1 cm above the carina. Nasogastric tube tip and side port are below the diaphragm. No pneumothorax. There is atelectatic change in the left base with small left pleural effusion. The lungs elsewhere are clear. Heart is mildly enlarged with pulmonary vascularity within normal limits. No adenopathy. There is aortic atherosclerosis. No evident bone lesions. IMPRESSION: Tube positions as described without pneumothorax. Left base atelectasis with small left pleural effusion. No edema or consolidation evident. Heart mildly enlarged but stable. There is aortic atherosclerosis. Aortic Atherosclerosis (ICD10-I70.0). Electronically Signed   By: Bretta BangWilliam  Woodruff III M.D.   On: 07/27/2017 15:38   Dg Abd Portable 1v  Result Date: 07/27/2017 CLINICAL DATA:  Assess nasogastric tube. EXAM: PORTABLE ABDOMEN - 1 VIEW COMPARISON:  CT abdomen and pelvis August 18, 2013 FINDINGS: Nasogastric tube tip and side-port project in proximal stomach. Included bowel gas pattern is nondilated and nonobstructive. Abdominal herniorrhaphy. No intra-abdominal mass effect or pathologic calcifications. Retrocardiac consolidation. Soft tissue planes and included osseous structures are nonacute. IMPRESSION: Nasogastric tube tip projects in proximal stomach. Electronically Signed   By: Awilda Metroourtnay  Bloomer M.D.   On: 07/27/2017 15:50     STUDIES:  CT head 12/5> unremarkable MRI brain 12/5 > multiple small areas of restricted diffusion in the brain  bilaterally most compatible with acute infarct from emboli.  CULTURES: [facility] 12/3 blood cx 2 MRSA = bactrim S, vanco S, dapto S, linezolid, S, tetra  12/1  blood cx x 2 mrsa 11/29- blood cx x 2 mrsa -   [facility] BCID: staph>aureus > methicillin resistance.  BCx2 12/4 >>>SA>>> BCX2 12/7>>>  ANTIBIOTICS: Vancomycin 12/4 >  SIGNIFICANT EVENTS:   LINES/TUBES: oett 12/6>>>  DISCUSSION: 56 year old male with MRSA bacteremia with mitral valve vegetation. Being treated with vancomycin and followed by ID. 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 Now intubated. Sedated overnight.  -get CT head/neck today per neuro recs -if done early this afternoon can try to transition to precedex again -lasix x 1 -assess for weaning when appropriate -cont supportive care   ASSESSMENT / PLAN:  MRSA bacteremia and mitral valve endocarditis -No history of IV drug abuse.  Source of nidus of infection not entirely clear but thoracic surgery also raises concern for necrotic/loose teeth Plan  Continue IV vancomycin per infectious disease direction Follow-up pending cultures Would be surgical candidate if: a) pulm status improved and can be extubated b) has dental consult and likely extractions c) has left and right heart cath d) has no worsening of LFTs and finally would add e) improvement in mental status.   Acute encephalopathy: likely multifactorial in the setting of ETOH abuse/withdrawal, septic embolic CVA, bacteremia Plan Cont thiamine and folate Cont diprivan until CT head complete then resume precedex Cont supportive care   Acute hypoxemic respiratory failure in setting of pulmonary edema and ineffective airway protection -Intubated 12/6 -Chest x-ray personally reviewed: ETT ok position. L>R airspace disease. Can't exclude element of effusion Plan Cont full vent support Cont scheduled lasix  PAD protocol;  RAS goal -1 to -2; keep on prop for now; transition back to precedex after CT scan (if done later today then start precedex in am 12/8) Daily assessment for weaning  Embolic CVA Plan No asa CT angio ordered b neuro r/o mycotic aneurysm Treat endocarditis    Hypertension Plan Lasix and PRN hydralazine   Abnormal LFTs Plan Trend  Possible hematuria Plan Ck UA  DM with hyperglycemia Plan Ssi, add scheduled basal dosing   AAA Plan F/u CT 1 year   DVT prophylaxis: LMWH SUP: PPI  Diet: tubefeeds Activity: BR Disposition : ICU  Simonne Martinet ACNP-BC [facility] Pulmonary/Critical Care Pager # [phone] OR # [phone] if no answer  07/28/2017 9:02 AM

## 2017-07-28 NOTE — Progress Notes (Signed)
Progress Note  Patient Name: Reginald [name] Date of Encounter: 07/28/2017  Primary Cardiologist: Dr. Debara Pickett   Subjective   Sedated, though somewhat restless moving legs.  Intubated and OG tube.  Inpatient Medications    Scheduled Meds: . chlorhexidine gluconate (MEDLINE KIT)  15 mL Mouth Rinse BID  . Chlorhexidine Gluconate Cloth  6 each Topical Q0600  . enoxaparin (LOVENOX) injection  60 mg Subcutaneous Q24H  . feeding supplement (PRO-STAT SUGAR FREE 64)  30 mL Per Tube Daily  . feeding supplement (PRO-STAT SUGAR FREE 64)  60 mL Per Tube TID  . feeding supplement (VITAL HIGH PROTEIN)  1,000 mL Per Tube Q24H  . folic acid  1 mg Intravenous Daily  . furosemide  40 mg Intravenous BID  . insulin aspart  0-20 Units Subcutaneous Q4H  . insulin aspart  3 Units Subcutaneous Q4H  . mouth rinse  15 mL Mouth Rinse QID  . mupirocin ointment  1 application Nasal BID  . pantoprazole (PROTONIX) IV  40 mg Intravenous QHS  . [START ON 07/30/2017] thiamine  100 mg Intravenous Daily   Or  . [START ON 07/30/2017] thiamine  100 mg Oral Daily   Continuous Infusions: . sodium chloride 20 mL/hr at 07/28/17 0600  . propofol (DIPRIVAN) infusion 50 mcg/kg/min (07/28/17 0634)  . thiamine injection Stopped (07/27/17 2303)  . vancomycin Stopped (07/28/17 0520)   PRN Meds: acetaminophen **OR** acetaminophen, hydrALAZINE, ipratropium-albuterol, LORazepam, [DISCONTINUED] ondansetron **OR** ondansetron (ZOFRAN) IV   Vital Signs    Vitals:   07/28/17 0400 07/28/17 0421 07/28/17 0500 07/28/17 0600  BP: 123/65  126/66 (!) 124/59  Pulse: 71  74 70  Resp: (!) 22  (!) 22 (!) 22  Temp:  98.6 F (37 C)    TempSrc:  Oral    SpO2: 100%  100% 100%  Weight:   259 lb 4.2 oz (117.6 kg)   Height:        Intake/Output Summary (Last 24 hours) at 07/28/2017 0810 Last data filed at 07/28/2017 0600 Gross per 24 hour  Intake 1203.47 ml  Output 3100 ml  Net -1896.53 ml   Filed Weights   07/26/17 0442  07/27/17 0352 07/28/17 0500  Weight: 269 lb 2.9 oz (122.1 kg) 257 lb 0.9 oz (116.6 kg) 259 lb 4.2 oz (117.6 kg)    Telemetry    SR controlled very rare PVC- Personally Reviewed  ECG    No new - Personally Reviewed  Physical Exam   GEN: sedated though moving legs.   Neck: No JVD Cardiac: RRR, + murmurs, no rubs, or gallops.  Respiratory: Clear to auscultation bilaterally. In ant position GI: Soft, nontender, non-distended  MS: No edema; No deformity. Neuro: sedated  Psych: sedated  Labs    Chemistry Recent Labs  Lab 07/26/17 0340 07/27/17 0322 07/28/17 0501  NA 132* 134* 137  K 4.1 4.2 4.0  CL 94* 98* 101  CO2 '30 30 30  '$ GLUCOSE 221* 200* 207*  BUN 28* 21* 38*  CREATININE 0.73 0.61 0.81  CALCIUM 8.6* 8.4* 8.4*  PROT 7.1 6.7 6.8  ALBUMIN 2.2* 2.0* 1.9*  AST 46* 40 30  ALT 58 50 39  ALKPHOS 245* 217* 187*  BILITOT 1.7* 2.0* 1.7*  GFRNONAA >60 >60 >60  GFRAA >60 >60 >60  ANIONGAP '8 6 6     '$ Hematology Recent Labs  Lab 07/26/17 0340 07/27/17 0322  WBC 12.5* 13.0*  RBC 3.85* 3.65*  HGB 12.1* 11.4*  HCT 36.4* 34.1*  MCV 94.5  93.4  MCH 31.4 31.2  MCHC 33.2 33.4  RDW 14.0 13.9  PLT 152 156    Cardiac EnzymesNo results for input(s): TROPONINI in the last 168 hours. No results for input(s): TROPIPOC in the last 168 hours.   BNP Recent Labs  Lab 07/26/17 0340  BNP 104.6*     DDimer No results for input(s): DDIMER in the last 168 hours.   Radiology    Mr Brain Wo Contrast  Result Date: 07/26/2017 CLINICAL DATA:  Focal neuro deficit. MRSA bacteremia. Altered mental status EXAM: MRI HEAD WITHOUT CONTRAST TECHNIQUE: Multiplanar, multiecho pulse sequences of the brain and surrounding structures were obtained without intravenous contrast. COMPARISON:  CT head 07/26/2017 FINDINGS: Limited study. The patient not able to complete the study. Axial diffusion sagittal T1 images were obtained before study was terminated prematurely Multiple small areas of  restricted diffusion are present in both cerebral hemispheres in occipital, parietal, and frontal lobes. Focal restricted diffusion right caudate. Small restricted diffusion right cerebellum. Ventricle size normal.  No midline shift. IMPRESSION: Incomplete study. 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 Consider septic emboli given the history of bacteremia. Electronically Signed   By: Franchot Gallo M.D.   On: 07/26/2017 11:33   Dg Chest Port 1 View  Result Date: 07/28/2017 CLINICAL DATA:  Acute respiratory failure. EXAM: PORTABLE CHEST 1 VIEW COMPARISON:  Radiograph July 27, 2017. FINDINGS: Stable cardiomegaly. Endotracheal and nasogastric tubes are unchanged in position. No definite pneumothorax is noted. Right lung is clear. Left basilar opacity is noted concerning for atelectasis or pneumonia with mild associated pleural effusion. Old left rib fractures are noted. IMPRESSION: Stable support apparatus. Left basilar atelectasis or pneumonia is noted with mild associated pleural effusion. Electronically Signed   By: Marijo Conception, M.D.   On: 07/28/2017 07:47   Dg Chest Port 1 View  Result Date: 07/27/2017 CLINICAL DATA:  Hypoxia EXAM: PORTABLE CHEST 1 VIEW COMPARISON:  July 27, 2017 study obtained earlier in the day FINDINGS: Endotracheal tube tip is 5.1 cm above the carina. Nasogastric tube tip and side port are below the diaphragm. No pneumothorax. There is atelectatic change in the left base with small left pleural effusion. The lungs elsewhere are clear. Heart is mildly enlarged with pulmonary vascularity within normal limits. No adenopathy. There is aortic atherosclerosis. No evident bone lesions. IMPRESSION: Tube positions as described without pneumothorax. Left base atelectasis with small left pleural effusion. No edema or consolidation evident. Heart mildly enlarged but stable. There is aortic atherosclerosis.  Aortic Atherosclerosis (ICD10-I70.0). Electronically Signed   By: Lowella Grip III M.D.   On: 07/27/2017 15:38   Dg Chest Port 1 View  Result Date: 07/27/2017 CLINICAL DATA:  Endotracheal tube placement. EXAM: PORTABLE CHEST 1 VIEW COMPARISON:  07/25/2017 . FINDINGS: Endotracheal tube noted 10.3 cm above the carina at the thoracic inlet. Advancement of approximately 5 cm should be considered. Cardiomegaly with pulmonary vascular prominence, bilateral interstitial prominence, bilateral pleural effusions again noted. Findings consistent CHF. IMPRESSION: 1. Endotracheal tube noted 10.3 cm above the carina at the thoracic inlet. Advancement of approximately 5 cm should be considered. 2. Persistent cardiomegaly with bilateral from interstitial prominence of small pleural effusions consistent persistent CHF. Similar findings noted on prior exam. These results will be called to the ordering clinician or representative by the Radiologist Assistant, and communication documented in the PACS or zVision Dashboard. Electronically Signed   By: Marcello Moores  Register   On:  07/27/2017 09:00   Dg Abd Portable 1v  Result Date: 07/27/2017 CLINICAL DATA:  Assess nasogastric tube. EXAM: PORTABLE ABDOMEN - 1 VIEW COMPARISON:  CT abdomen and pelvis August 18, 2013 FINDINGS: Nasogastric tube tip and side-port project in proximal stomach. Included bowel gas pattern is nondilated and nonobstructive. Abdominal herniorrhaphy. No intra-abdominal mass effect or pathologic calcifications. Retrocardiac consolidation. Soft tissue planes and included osseous structures are nonacute. IMPRESSION: Nasogastric tube tip projects in proximal stomach. Electronically Signed   By: Elon Alas M.D.   On: 07/27/2017 15:50    Cardiac Studies   Echocardiogram 07/21/17 done at [facility] Mild concentric LVH with normal systolic function, EF ~36%. Diastolic filling pattern suggestsincreased left atrial pressure. LAE. Normal aortic valve. Normal  right heart structure.  Echodensity c/w vegetation is noted on the anterior leaflet of the mitral valve. Moderate, posteriorly directed mitral regurgitation.No pericardial effusion.   TEE 07/27/17  1. 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 2. There is a small mobile mass of the non-coronary cusp of the aortic valve with trivial AI - this may represent a vegetation as well or perhaps sclerosis of the leaflet tip. 3. No LAA thrombus 4. Negative for PFO by color doppler and saline microbubble contrast 5. Grade 3 mobile atheroma of the proximal aortic arch 6. LVEF 60-65% with normal wall motion    Patient Profile     56 y.o. male with a hx of diastolic CHF, AAA, COPD, DM type 2, tobacco use and alcohol use now with admit for HF and Bacteremia & endocarditis.      Assessment & Plan    Diastolic HF  Now neg 4383 since admit and wt down from 269 to 259 lbs.  --on lasix 40 IV BID  Edema improved on CXR  No rales ? 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 Darcey Nora has seen and not currently candidate for MV replacement due to continued + blood cultures, ETOH withdrawal and intubated.  Prior to surgery will need dental eval due to poor dentation.  Will also need Rt and Lt heart cath prior to surgery.  Continue ABX therapy.  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      Signed, Cecilie Kicks, NP  07/28/2017, 8:10 AM

## 2017-07-29 LAB — CK TOTAL AND CKMB (NOT AT ARMC)
CK, MB: 1.6 ng/mL (ref 0.5–5.0)
RELATIVE INDEX: INVALID (ref 0.0–2.5)
Total CK: 26 U/L — ABNORMAL LOW (ref 49–397)

## 2017-07-29 LAB — GLUCOSE, CAPILLARY
GLUCOSE-CAPILLARY: 225 mg/dL — AB (ref 65–99)
GLUCOSE-CAPILLARY: 166 mg/dL — AB (ref 65–99)
Glucose-Capillary: 135 mg/dL — ABNORMAL HIGH (ref 65–99)
Glucose-Capillary: 144 mg/dL — ABNORMAL HIGH (ref 65–99)
Glucose-Capillary: 151 mg/dL — ABNORMAL HIGH (ref 65–99)
Glucose-Capillary: 167 mg/dL — ABNORMAL HIGH (ref 65–99)
Glucose-Capillary: 182 mg/dL — ABNORMAL HIGH (ref 65–99)

## 2017-07-29 LAB — VANCOMYCIN, PEAK
VANCOMYCIN PK: 38 ug/mL (ref 30–40)
VANCOMYCIN PK: 37 ug/mL (ref 30–40)

## 2017-07-29 LAB — CBC
HEMATOCRIT: 32.6 % — AB (ref 39.0–52.0)
Hemoglobin: 10.6 g/dL — ABNORMAL LOW (ref 13.0–17.0)
MCH: 31.6 pg (ref 26.0–34.0)
MCHC: 32.5 g/dL (ref 30.0–36.0)
MCV: 97.3 fL (ref 78.0–100.0)
PLATELETS: 184 10*3/uL (ref 150–400)
RBC: 3.35 MIL/uL — AB (ref 4.22–5.81)
RDW: 14.1 % (ref 11.5–15.5)
WBC: 10.4 10*3/uL (ref 4.0–10.5)

## 2017-07-29 LAB — BASIC METABOLIC PANEL
ANION GAP: 5 (ref 5–15)
BUN: 39 mg/dL — ABNORMAL HIGH (ref 6–20)
CO2: 31 mmol/L (ref 22–32)
Calcium: 8.7 mg/dL — ABNORMAL LOW (ref 8.9–10.3)
Chloride: 104 mmol/L (ref 101–111)
Creatinine, Ser: 0.69 mg/dL (ref 0.61–1.24)
GFR calc non Af Amer: >60 mL/min (ref >60)
Glucose, Bld: 202 mg/dL — ABNORMAL HIGH (ref 65–99)
POTASSIUM: 4.1 mmol/L (ref 3.5–5.1)
SODIUM: 140 mmol/L (ref 135–145)

## 2017-07-29 LAB — VANCOMYCIN, TROUGH: Vancomycin Tr: 38 ug/mL (ref 15–20)

## 2017-07-29 MED ORDER — VANCOMYCIN HCL 10 G IV SOLR
1500.0000 mg | Freq: Two times a day (BID) | INTRAVENOUS | Status: AC
  Administered 2017-07-29: 1500 mg via INTRAVENOUS
  Filled 2017-07-29: qty 1500

## 2017-07-29 MED ORDER — FENTANYL BOLUS VIA INFUSION
50.0000 ug | INTRAVENOUS | Status: DC | PRN
Start: 2017-07-29 — End: 2017-07-31
  Administered 2017-07-30: 50 ug via INTRAVENOUS
  Filled 2017-07-29: qty 50

## 2017-07-29 MED ORDER — SODIUM CHLORIDE 0.9 % IV SOLN
25.0000 ug/h | INTRAVENOUS | Status: DC
  Administered 2017-07-29: 50 ug/h via INTRAVENOUS
  Administered 2017-07-31: 100 ug/h via INTRAVENOUS
  Filled 2017-07-29 (×2): qty 50

## 2017-07-29 MED ORDER — FENTANYL CITRATE (PF) 100 MCG/2ML IJ SOLN
50.0000 ug | Freq: Once | INTRAMUSCULAR | Status: DC
Start: 2017-07-29 — End: 2017-07-31

## 2017-07-29 NOTE — Progress Notes (Signed)
PULMONARY / CRITICAL CARE MEDICINE   Name: Reginald [name] MRN: [id number] DOB: 12-26-1960    ADMISSION DATE:  07/25/2017 CONSULTATION DATE:  12/6  REFERRING MD:  Dr Lowell Guitar [facility]  CHIEF COMPLAINT:  agitation  brief 56 year old male with past medical history as below, which is significant for COPD, diabetes mellitus, alcohol abuse, diastolic CHF, and AAA who was initially admitted to [facility] on 11/29 with nonspecific complaints of generalized weakness, low-grade fever, and muscle aches.  Blood cultures done at admission grew out MRSA and he was started on vancomycin.  2D echocardiogram at that time demonstrated a mitral valve vegetation.  He did require ICU transfer during his time at [facility] for Precedex infusion with presumed alcohol withdrawal was weaned off on 12/3.  The patient was transferred to [facility] for infectious diseases consultation.  On 12/4 he was found to have a new facial droop.  MRI was done and demonstrated multiple infarcts to both cerebral hemispheres consistent with septic emboli.  12/5 late p.m. he developed agitation which is refractory to repeated doses of Ativan.  He was started on Precedex infusion.  PCCM asked to see in consultation.    STUDIES:  CT head 12/5> unremarkable MRI brain 12/5 > multiple small areas of restricted diffusion in the brain bilaterally most compatible with acute infarct from emboli.  CULTURES: [facility] 12/3 blood cx 2 MRSA = bactrim S, vanco S, dapto S, linezolid, S, tetra  12/1  blood cx x 2 mrsa 11/29- blood cx x 2 mrsa -   [facility] BCID: staph>aureus > methicillin resistance.  BCx2 12/4 >>>SA>>> BCX2 12/7>>>  ANTIBIOTICS: Vancomycin 12/4 >  SIGNIFICANT EVENTS:   LINES/TUBES: oett 12/6>>>   SUBJECTIVE:  07/29/2017 - not on pressors. Making urine. 40%fio2 . On diprivan. Got agitated and desaturaetd during  WUA/SBT. Back on vent   VITAL SIGNS: BP (!) 160/61   Pulse 86   Temp 98.3 F (36.8 C) (Axillary)    Resp (!) 22   Ht 5\' 9"  (1.753 m)   Wt 114.6 kg (252 lb 10.4 oz)   SpO2 95%   BMI 37.31 kg/m   HEMODYNAMICS:    VENTILATOR SETTINGS: Vent Mode: PRVC FiO2 (%):  [40 %-60 %] 60 % Set Rate:  [16 bmp] 16 bmp Vt Set:  [560 mL] 560 mL PEEP:  [5 cmH20-10 cmH20] 5 cmH20 Plateau Pressure:  [19 cmH20-25 cmH20] 25 cmH20  INTAKE / OUTPUT:  Intake/Output Summary (Last 24 hours) at 07/29/2017 1220 Last data filed at 07/29/2017 1200 Gross per 24 hour  Intake 2510 ml  Output 3450 ml  Net -940 ml     PHYSICAL EXAMINATION:  General Appearance:    Looks criticall ill OBESE - +  Head:    Normocephalic, without obvious abnormality, atraumatic  Eyes:    PERRL - yes, conjunctiva/corneas - clear      Ears:    Normal external ear canals, both ears  Nose:   NG tube - no  Throat:  ETT TUBE - yes , OG tube - yes  Neck:   Supple,  No enlargement/tenderness/nodules     Lungs:     Clear to auscultation bilaterally, Ventilator   Synchrony - yes  Chest wall:    No deformity  Heart:    S1 and S2 normal, no murmur, CVP - no.  Pressors - no  Abdomen:     Soft, no masses, no organomegaly  Genitalia:    Not done  Rectal:   not done  Extremities:  Extremities- intact     Skin:   Intact in exposed areas . Sacral area - no decub     Neurologic:   Sedation - diprivan ggtt -> RASS - -3 . Moves all 4s - yes. CAM-ICU - + for delirium during wua . Orientation - NA    PULMONARY Recent Labs  Lab 07/27/17 0000 07/27/17 0730 07/27/17 1120 07/28/17 0330  PHART 7.505* 7.363 7.397 7.415  PCO2ART 40.2 58.3* 53.5* 49.2*  PO2ART 64.3* 146* 332* 112*  HCO3 31.4* 32.3* 32.2* 30.9*  O2SAT 92.3 98.6 99.6 98.0    CBC Recent Labs  Lab 07/26/17 0340 07/27/17 0322 07/29/17 0233  HGB 12.1* 11.4* 10.6*  HCT 36.4* 34.1* 32.6*  WBC 12.5* 13.0* 10.4  PLT 152 156 184    COAGULATION No results for input(s): INR in the last 168 hours.  CARDIAC  No results for input(s): TROPONINI in the last 168 hours. No  results for input(s): PROBNP in the last 168 hours.   CHEMISTRY Recent Labs  Lab 07/25/17 2127  07/26/17 0340 07/27/17 0322 07/27/17 1113 07/27/17 1701 07/28/17 0501 07/28/17 1728 07/29/17 0233  NA  --   --  132* 134*  --   --  137  --  140  K  --    < > 4.1 4.2  --   --  4.0  --  4.1  CL  --   --  94* 98*  --   --  101  --  104  CO2  --   --  30 30  --   --  30  --  31  GLUCOSE  --   --  221* 200*  --   --  207*  --  202*  BUN  --   --  28* 21*  --   --  38*  --  39*  CREATININE 0.80  --  0.73 0.61  --   --  0.81  --  0.69  CALCIUM  --   --  8.6* 8.4*  --   --  8.4*  --  8.7*  MG  --   --   --   --  2.0 1.9 2.1 2.0  --   PHOS  --   --   --   --  5.1* 5.3* 4.8* 4.6  --    < > = values in this interval not displayed.   Estimated Creatinine Clearance: 127.2 mL/min (by C-G formula based on SCr of 0.69 mg/dL).   LIVER Recent Labs  Lab 07/26/17 0340 07/27/17 0322 07/28/17 0501  AST 46* 40 30  ALT 58 50 39  ALKPHOS 245* 217* 187*  BILITOT 1.7* 2.0* 1.7*  PROT 7.1 6.7 6.8  ALBUMIN 2.2* 2.0* 1.9*     INFECTIOUS No results for input(s): LATICACIDVEN, PROCALCITON in the last 168 hours.   ENDOCRINE CBG (last 3)  Recent Labs    07/29/17 0416 07/29/17 0736 07/29/17 1157  GLUCAP 151* 135* 225*         IMAGING x48h  - image(s) personally visualized  -   highlighted in bold Ct Angio Head W Or Wo Contrast  Result Date: 07/28/2017 CLINICAL DATA:  New onset facial droop. Sepsis. Multiple small infarctions by MRI 2 days ago. Septic emboli suspected. EXAM: CT ANGIOGRAPHY HEAD AND NECK TECHNIQUE: Multidetector CT imaging of the head and neck was performed using the standard protocol during bolus administration of intravenous contrast. Multiplanar CT image reconstructions and MIPs were obtained to evaluate  the vascular anatomy. Carotid stenosis measurements (when applicable) are obtained utilizing NASCET criteria, using the distal internal carotid diameter as the  denominator. CONTRAST:  ISOVUE-370 IOPAMIDOL (ISOVUE-370) INJECTION 76% COMPARISON:  CT and MR exams 07/26/2017 FINDINGS: CT HEAD FINDINGS Brain: Generalized atrophy. Small infarctions seen previously cannot be specifically resolved. No evidence of large acute infarction, mass lesion, hemorrhage, hydrocephalus or extra-axial collection. Physiologic basal ganglia calcification. Old white matter infarction left forceps major. Vascular: There is atherosclerotic calcification of the major vessels at the base of the brain. Skull: Negative Sinuses: No acute sinusitis. Chronic mucoperiosteal thickening of the maxillary sinuses. Orbits: Negative Review of the MIP images confirms the above findings CTA NECK FINDINGS Aortic arch: Aortic atherosclerosis.  No aneurysm or dissection. Right carotid system: Common carotid artery shows atherosclerotic disease but no flow limiting stenosis. Stenosis of 30% in the midportion. Advanced calcified plaque at the carotid bifurcation and proximal ICA. Minimal diameter is 5 mm, therefore there is no stenosis. Cervical ICA is widely patent. Focal calcified plaque just beneath the skullbase but with narrowing of only 20%. Left carotid system: Common carotid artery shows atherosclerotic plaque but without narrowing of more than 20%. Complex calcified plaque at the carotid bifurcation and proximal ICA. Minimal proximal ICA diameter 3.2 mm. Compared to a more distal cervical ICA diameter of 4 mm, this indicates a 20% stenosis. Vertebral arteries: Both vertebral artery origins are patent. Both vertebral arteries are patent through the cervical region. The right vertebral artery is a very small vessel. Skeleton: Ordinary cervical spondylosis. Other neck: No mass or lymphadenopathy. Upper chest: Areas of patchy pulmonary opacity. Layering pleural effusions. Findings could represent infiltrate or ARDS related to sepsis. Review of the MIP images confirms the above findings CTA HEAD FINDINGS  Anterior circulation: Both internal carotid arteries are patent through the skullbase and siphon regions. No siphon stenosis. The anterior and middle cerebral vessels are patent without proximal stenosis, aneurysm or vascular malformation. No missing branch vessels are identified. Posterior circulation: Both vertebral arteries are patent to the basilar. The left vertebral artery is dominant. No basilar stenosis. Posterior circulation branch vessels are patent. Posterior cerebral arteries receive most of there supply from the anterior circulation. Venous sinuses: Patent and normal Anatomic variants: None significant. Probable venous angioma right posterior parietal brain. Delayed phase: No abnormal enhancement Review of the MIP images confirms the above findings IMPRESSION: No large or medium vessel intracranial stenosis or occlusion. 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 Probable venous angioma right posterior parietal region incidentally noted. Atherosclerotic disease at both carotid bifurcation regions. No measurable stenosis on the right. 20% stenosis of the proximal ICA on the left. Though there is no flow limiting stenosis, irregular plaque could possibly serve as a source of emboli. However, 1 would not expect embolic disease throughout all vascular territories as shown on the previous MRI. Therefore, most likely source would be the heart or ascending aorta. Electronically Signed   By: Paulina Fusi M.D.   On: 07/28/2017 11:19   Ct Angio Neck W Or Wo Contrast  Result Date: 07/28/2017 CLINICAL DATA:  New onset facial droop. Sepsis. Multiple small infarctions by MRI 2 days ago. Septic emboli suspected. EXAM: CT ANGIOGRAPHY HEAD AND NECK TECHNIQUE: Multidetector CT imaging of the head and neck was performed using the standard protocol during bolus administration of intravenous contrast. Multiplanar CT image reconstructions and MIPs were obtained to evaluate the  vascular anatomy. Carotid stenosis measurements (when applicable) are obtained utilizing  NASCET criteria, using the distal internal carotid diameter as the denominator. CONTRAST:  ISOVUE-370 IOPAMIDOL (ISOVUE-370) INJECTION 76% COMPARISON:  CT and MR exams 07/26/2017 FINDINGS: CT HEAD FINDINGS Brain: Generalized atrophy. Small infarctions seen previously cannot be specifically resolved. No evidence of large acute infarction, mass lesion, hemorrhage, hydrocephalus or extra-axial collection. Physiologic basal ganglia calcification. Old white matter infarction left forceps major. Vascular: There is atherosclerotic calcification of the major vessels at the base of the brain. Skull: Negative Sinuses: No acute sinusitis. Chronic mucoperiosteal thickening of the maxillary sinuses. Orbits: Negative Review of the MIP images confirms the above findings CTA NECK FINDINGS Aortic arch: Aortic atherosclerosis.  No aneurysm or dissection. Right carotid system: Common carotid artery shows atherosclerotic disease but no flow limiting stenosis. Stenosis of 30% in the midportion. Advanced calcified plaque at the carotid bifurcation and proximal ICA. Minimal diameter is 5 mm, therefore there is no stenosis. Cervical ICA is widely patent. Focal calcified plaque just beneath the skullbase but with narrowing of only 20%. Left carotid system: Common carotid artery shows atherosclerotic plaque but without narrowing of more than 20%. Complex calcified plaque at the carotid bifurcation and proximal ICA. Minimal proximal ICA diameter 3.2 mm. Compared to a more distal cervical ICA diameter of 4 mm, this indicates a 20% stenosis. Vertebral arteries: Both vertebral artery origins are patent. Both vertebral arteries are patent through the cervical region. The right vertebral artery is a very small vessel. Skeleton: Ordinary cervical spondylosis. Other neck: No mass or lymphadenopathy. Upper chest: Areas of patchy pulmonary opacity. Layering  pleural effusions. Findings could represent infiltrate or ARDS related to sepsis. Review of the MIP images confirms the above findings CTA HEAD FINDINGS Anterior circulation: Both internal carotid arteries are patent through the skullbase and siphon regions. No siphon stenosis. The anterior and middle cerebral vessels are patent without proximal stenosis, aneurysm or vascular malformation. No missing branch vessels are identified. Posterior circulation: Both vertebral arteries are patent to the basilar. The left vertebral artery is dominant. No basilar stenosis. Posterior circulation branch vessels are patent. Posterior cerebral arteries receive most of there supply from the anterior circulation. Venous sinuses: Patent and normal Anatomic variants: None significant. Probable venous angioma right posterior parietal brain. Delayed phase: No abnormal enhancement Review of the MIP images confirms the above findings IMPRESSION: No large or medium vessel intracranial stenosis or occlusion. 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 Probable venous angioma right posterior parietal region incidentally noted. Atherosclerotic disease at both carotid bifurcation regions. No measurable stenosis on the right. 20% stenosis of the proximal ICA on the left. Though there is no flow limiting stenosis, irregular plaque could possibly serve as a source of emboli. However, 1 would not expect embolic disease throughout all vascular territories as shown on the previous MRI. Therefore, most likely source would be the heart or ascending aorta. Electronically Signed   By: Paulina Fusi M.D.   On: 07/28/2017 11:19   Dg Chest Port 1 View  Result Date: 07/28/2017 CLINICAL DATA:  Acute respiratory failure. EXAM: PORTABLE CHEST 1 VIEW COMPARISON:  Radiograph July 27, 2017. FINDINGS: Stable cardiomegaly. Endotracheal and nasogastric tubes are unchanged in position. No definite pneumothorax is  noted. Right lung is clear. Left basilar opacity is noted concerning for atelectasis or pneumonia with mild associated pleural effusion. Old left rib fractures are noted. IMPRESSION: Stable support apparatus. Left basilar atelectasis or pneumonia is noted with mild associated pleural effusion. Electronically Signed   By: Fayrene Fearing  Christen ButterGreen Jr, M.D.   On: 07/28/2017 07:47   Dg Chest Port 1 View  Result Date: 07/27/2017 CLINICAL DATA:  Hypoxia EXAM: PORTABLE CHEST 1 VIEW COMPARISON:  July 27, 2017 study obtained earlier in the day FINDINGS: Endotracheal tube tip is 5.1 cm above the carina. Nasogastric tube tip and side port are below the diaphragm. No pneumothorax. There is atelectatic change in the left base with small left pleural effusion. The lungs elsewhere are clear. Heart is mildly enlarged with pulmonary vascularity within normal limits. No adenopathy. There is aortic atherosclerosis. No evident bone lesions. IMPRESSION: Tube positions as described without pneumothorax. Left base atelectasis with small left pleural effusion. No edema or consolidation evident. Heart mildly enlarged but stable. There is aortic atherosclerosis. Aortic Atherosclerosis (ICD10-I70.0). Electronically Signed   By: Bretta BangWilliam  Woodruff III M.D.   On: 07/27/2017 15:38   Dg Abd Portable 1v  Result Date: 07/27/2017 CLINICAL DATA:  Assess nasogastric tube. EXAM: PORTABLE ABDOMEN - 1 VIEW COMPARISON:  CT abdomen and pelvis August 18, 2013 FINDINGS: Nasogastric tube tip and side-port project in proximal stomach. Included bowel gas pattern is nondilated and nonobstructive. Abdominal herniorrhaphy. No intra-abdominal mass effect or pathologic calcifications. Retrocardiac consolidation. Soft tissue planes and included osseous structures are nonacute. IMPRESSION: Nasogastric tube tip projects in proximal stomach. Electronically Signed   By: Awilda Metroourtnay  Bloomer M.D.   On: 07/27/2017 15:50      DISCUSSION: 56 year old male with MRSA  bacteremia with mitral valve vegetation. Being treated with vancomycin and followed by ID. 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 Now intubated. Sedated overnight.   ASSESSMENT / PLAN:  MRSA bacteremia and mitral valve endocarditis -No history of IV drug abuse.  Source of nidus of infection not entirely clear but thoracic surgery also raises concern for necrotic/loose teeth Anti-infectives (From admission, onward)   Start     Dose/Rate Route Frequency Ordered Stop   07/29/17 1500  vancomycin (VANCOCIN) 1,500 mg in sodium chloride 0.9 % 500 mL IVPB     1,500 mg 250 mL/hr over 120 Minutes Intravenous Every 12 hours 07/29/17 0704 07/30/17 0259   07/26/17 1400  vancomycin (VANCOCIN) 1,500 mg in sodium chloride 0.9 % 500 mL IVPB  Status:  Discontinued     1,500 mg 250 mL/hr over 120 Minutes Intravenous Every 12 hours 07/26/17 1254 07/29/17 0247   07/25/17 2200  vancomycin (VANCOCIN) 2,500 mg in sodium chloride 0.9 % 500 mL IVPB  Status:  Discontinued     2,500 mg 250 mL/hr over 120 Minutes Intravenous  Once 07/25/17 2124 07/25/17 2210   07/25/17 2130  vancomycin (VANCOCIN) IVPB 1000 mg/200 mL premix  Status:  Discontinued     1,000 mg 200 mL/hr over 60 Minutes Intravenous  Once 07/25/17 2118 07/25/17 2123       Plan Continue IV vancomycin per infectious disease direction Follow-up pending cultures Would be surgical candidate if: a) pulm status improved and can be extubated b) has dental consult and likely extractions c) has left and right heart cath d) has no worsening of LFTs and finally would add e) improvement in mental status.   Acute encephalopathy: likely multifactorial in the setting of ETOH abuse/withdrawal, septic embolic CVA, bacteremia  07/29/2017 - agitated on WUA. Handling diprivan well  Plan Cont thiamine and folate Cont diprivan ; check lactate and CK 07/30/17 Cont supportive care    Acute hypoxemic respiratory failure in setting of pulmonary edema and  ineffective airway protection  07/29/2017 - 07/29/2017 - > does NOT meet criteria for SBT/Extubation in setting of Acute Respiratory Failure due to ag9tation    Plan Cont full vent support Cont scheduled lasix  PAD protocol; RAS goal -1 to -2; keep on prop for now; transition back to precedex after CT scan (if done later today then start precedex in am 12/8) Daily assessment for weaning  Embolic CVA Plan No asa aneurysm Treat endocarditis    Hypertension Plan Lasix and PRN hydralazine   Abnormal LFTs Plan Trend  Possible hematuria Plan Ck UA  DM with hyperglycemia Plan Ssi, add scheduled basal dosing   AAA Plan F/u CT 1 year   DVT prophylaxis: LMWH SUP: PPI  Diet: tubefeeds Activity: BR Disposition : ICU      The patient is critically ill with multiple organ systems failure and requires high complexity decision making for assessment and support, frequent evaluation and titration of therapies, application of advanced monitoring technologies and extensive interpretation of multiple databases.   Critical Care Time devoted to patient care services described in this note is  30  Minutes. This time reflects time of care of this signee Dr Kalman ShanMurali Diamante Truszkowski. This critical care time does not reflect procedure time, or teaching time or supervisory time of PA/NP/Med student/Med Resident etc but could involve care discussion time    Dr. Kalman ShanMurali Sabriel Borromeo, M.D., [facility] Center[facility].C.P Pulmonary and Critical Care Medicine Staff Physician [facility] [facility] Pager: [phone], If no answer or between  15:00h - 7:00h: call [phone]  07/29/2017 12:36 PM

## 2017-07-29 NOTE — Progress Notes (Signed)
Pharmacy Antibiotic Note  Reginald [name] is a 56 y.o. male admitted on 07/25/2017 with MRSA bacteremia and endocarditis.  Pharmacy has been consulted for Vancomycin dosing.  Vancomycin peak = 42 was not drawn correctly (only 5 minutes after dose was finished infusing {supposed to be 1-2 hours after infusion complete]) Vancomycin trough = 38 was also not drawn correctly (taken while dose was infusing [supposed to be 30 minutes prior to giving dose])  Plan:  Continue Vancomycin 1500 mg IV q12h.  Measure Vanc levels again today - will discuss with night nurse.  Follow up renal fxn, culture results, and clinical course.   Height: 5\' 9"  (175.3 cm) Weight: 252 lb 10.4 oz (114.6 kg) IBW/kg (Calculated) : 70.7  Temp (24hrs), Avg:98.6 F (37 C), Min:98 F (36.7 C), Max:99.5 F (37.5 C)  Recent Labs  Lab 07/25/17 2127 07/26/17 0340 07/26/17 1158 07/27/17 0322 07/28/17 0501 07/28/17 1728 07/29/17 0233 07/29/17 0637  WBC  --  12.5*  --  13.0*  --   --  10.4  --   CREATININE 0.80 0.73  --  0.61 0.81  --  0.69  --   VANCOTROUGH  --   --  15  --   --   --  38*  --   VANCOPEAK  --   --   --   --   --  42*  --  38    Estimated Creatinine Clearance: 127.2 mL/min (by C-G formula based on SCr of 0.69 mg/dL).    Allergies  Allergen Reactions  . Morphine And Related     Antimicrobials this admission: 12/4 [facility] >>  Dose adjustments this admission: 12/7 1800 Vancomycin peak level: 42 12/8 0230 Vancomycin trough level: 38  [facility]: MRSA (S bactrim, vanc, dapto, linezolid, TCN) - per Dr. Feliz BeamSnider's note 12/4 BCx: GPC (BCID = MRSA) 12/5 HIV Ab: negative 12/6 MRSA PCR: positive 12/7 BCx: GPC in clusters  Thank you for allowing pharmacy to be a part of this patient's care.  Loralee PacasErin Darra Rosa, PharmD, BCPS Pager: [phone] 07/29/2017 11:26 AM

## 2017-07-29 NOTE — Progress Notes (Signed)
PEEP increased on the ventilator to 8 cm H2O due to decreasing SpO2 dropping to 89% and only maintaining 92%.  Vent orders are for 95% or greater and Pt currently maintaining 95%

## 2017-07-29 NOTE — Progress Notes (Signed)
CRITICAL VALUE ALERT  Critical Value:  Vanc Trough   Date & Time Notied:  07/29/17 0330   Provider Notified: Pharmacist   Orders Received/Actions taken: Peak ordered  for 0515

## 2017-07-29 NOTE — Progress Notes (Addendum)
Progress Note  Patient Name: Tecumseh Yeagley Date of Encounter: 07/29/2017  Primary Cardiologist:Hilty  Subjective   No events overnight  Inpatient Medications    Scheduled Meds: . chlorhexidine gluconate (MEDLINE KIT)  15 mL Mouth Rinse BID  . Chlorhexidine Gluconate Cloth  6 each Topical Q0600  . enoxaparin (LOVENOX) injection  60 mg Subcutaneous Q24H  . feeding supplement (PRO-STAT SUGAR FREE 64)  30 mL Per Tube Daily  . feeding supplement (PRO-STAT SUGAR FREE 64)  60 mL Per Tube TID  . feeding supplement (VITAL HIGH PROTEIN)  1,000 mL Per Tube Q24H  . folic acid  1 mg Intravenous Daily  . furosemide  40 mg Intravenous BID  . insulin aspart  0-20 Units Subcutaneous Q4H  . insulin aspart  6 Units Subcutaneous Q4H  . mouth rinse  15 mL Mouth Rinse QID  . mupirocin ointment  1 application Nasal BID  . pantoprazole (PROTONIX) IV  40 mg Intravenous QHS  . [START ON 07/30/2017] thiamine  100 mg Intravenous Daily   Or  . [START ON 07/30/2017] thiamine  100 mg Oral Daily   Continuous Infusions: . sodium chloride 20 mL/hr at 07/29/17 0600  . propofol (DIPRIVAN) infusion 50 mcg/kg/min (07/29/17 2703)  . thiamine injection Stopped (07/29/17 0641)  . vancomycin     PRN Meds: acetaminophen **OR** acetaminophen, fentaNYL (SUBLIMAZE) injection, fentaNYL (SUBLIMAZE) injection, hydrALAZINE, ipratropium-albuterol, [DISCONTINUED] ondansetron **OR** ondansetron (ZOFRAN) IV   Vital Signs    Vitals:   07/29/17 0322 07/29/17 0400 07/29/17 0500 07/29/17 0600  BP: (!) 150/75  (!) 150/84 (!) 142/73  Pulse: 78 77 73 76  Resp: (!) _0 Temp:  99.5 F (37.5 C)    TempSrc:  Oral    SpO2: 99% 94% 94% 96%  Weight:  252 lb 10.4 oz (114.6 kg)    Height:        Intake/Output Summary (Last 24 hours) at 07/29/2017 0727 Last data filed at 07/29/2017 0641 Gross per 24 hour  Intake 2653 ml  Output 3150 ml  Net -497 ml   Filed Weights   07/27/17 0352 07/28/17 0500 07/29/17 0400    Weight: 257 lb 0.9 oz (116.6 kg) 259 lb 4.2 oz (117.6 kg) 252 lb 10.4 oz (114.6 kg)    Telemetry    NSR  ECG    n/a  Physical Exam   GEN: No acute distress.   Neck: No JVD Cardiac: RRR, 2/6 systolic murmur at apex Respiratory: coarse bilaterally GI: Soft, nontender, non-distended  MS: No edema; No deformity. Neuro:  Nonfocal  Psych: Normal affect   Labs    Chemistry Recent Labs  Lab 07/26/17 0340 07/27/17 0322 07/28/17 0501 07/29/17 0233  NA 132* 134* 137 140  K 4.1 4.2 4.0 4.1  CL 94* 98* 101 104  CO2 _1 GLUCOSE 221* 200* 207* 202*  BUN 28* 21* 38* 39*  CREATININE 0.73 0.61 0.81 0.69  CALCIUM 8.6* 8.4* 8.4* 8.7*  PROT 7.1 6.7 6.8  --   ALBUMIN 2.2* 2.0* 1.9*  --   AST 46* 40 30  --   ALT 58 50 39  --   ALKPHOS 245* 217* 187*  --   BILITOT 1.7* 2.0* 1.7*  --   GFRNONAA >60 >60 >60 >60  GFRAA >60 >60 >60 >60  ANIONGAP _2 Hematology Recent Labs  Lab 07/26/17 0340 07/27/17 0322 07/29/17 0233  WBC 12.5* 13.0* 10.4  RBC  3.85* 3.65* 3.35*  HGB 12.1* 11.4* 10.6*  HCT 36.4* 34.1* 32.6*  MCV 94.5 93.4 97.3  MCH 31.4 31.2 31.6  MCHC 33.2 33.4 32.5  RDW 14.0 13.9 14.1  PLT 152 156 184    Cardiac EnzymesNo results for input(s): TROPONINI in the last 168 hours. No results for input(s): TROPIPOC in the last 168 hours.   BNP Recent Labs  Lab 07/26/17 0340  BNP 104.6*     DDimer No results for input(s): DDIMER in the last 168 hours.   Radiology    Ct Angio Head W Or Wo Contrast  Result Date: 07/28/2017 CLINICAL DATA:  New onset facial droop. Sepsis. Multiple small infarctions by MRI 2 days ago. Septic emboli suspected. EXAM: CT ANGIOGRAPHY HEAD AND NECK TECHNIQUE: Multidetector CT imaging of the head and neck was performed using the standard protocol during bolus administration of intravenous contrast. Multiplanar CT image reconstructions and MIPs were obtained to evaluate the vascular anatomy. Carotid stenosis measurements (when  applicable) are obtained utilizing NASCET criteria, using the distal internal carotid diameter as the denominator. CONTRAST:  176m ISOVUE-370 IOPAMIDOL (ISOVUE-370) INJECTION 76% COMPARISON:  CT and MR exams 07/26/2017 FINDINGS: CT HEAD FINDINGS Brain: Generalized atrophy. Small infarctions seen previously cannot be specifically resolved. No evidence of large acute infarction, mass lesion, hemorrhage, hydrocephalus or extra-axial collection. Physiologic basal ganglia calcification. Old white matter infarction left forceps major. Vascular: There is atherosclerotic calcification of the major vessels at the base of the brain. Skull: Negative Sinuses: No acute sinusitis. Chronic mucoperiosteal thickening of the maxillary sinuses. Orbits: Negative Review of the MIP images confirms the above findings CTA NECK FINDINGS Aortic arch: Aortic atherosclerosis.  No aneurysm or dissection. Right carotid system: Common carotid artery shows atherosclerotic disease but no flow limiting stenosis. Stenosis of 30% in the midportion. Advanced calcified plaque at the carotid bifurcation and proximal ICA. Minimal diameter is 5 mm, therefore there is no stenosis. Cervical ICA is widely patent. Focal calcified plaque just beneath the skullbase but with narrowing of only 20%. Left carotid system: Common carotid artery shows atherosclerotic plaque but without narrowing of more than 20%. Complex calcified plaque at the carotid bifurcation and proximal ICA. Minimal proximal ICA diameter 3.2 mm. Compared to a more distal cervical ICA diameter of 4 mm, this indicates a 20% stenosis. Vertebral arteries: Both vertebral artery origins are patent. Both vertebral arteries are patent through the cervical region. The right vertebral artery is a very small vessel. Skeleton: Ordinary cervical spondylosis. Other neck: No mass or lymphadenopathy. Upper chest: Areas of patchy pulmonary opacity. Layering pleural effusions. Findings could represent infiltrate  or ARDS related to sepsis. Review of the MIP images confirms the above findings CTA HEAD FINDINGS Anterior circulation: Both internal carotid arteries are patent through the skullbase and siphon regions. No siphon stenosis. The anterior and middle cerebral vessels are patent without proximal stenosis, aneurysm or vascular malformation. No missing Catlin Doria vessels are identified. Posterior circulation: Both vertebral arteries are patent to the basilar. The left vertebral artery is dominant. No basilar stenosis. Posterior circulation Bich Mchaney vessels are patent. Posterior cerebral arteries receive most of there supply from the anterior circulation. Venous sinuses: Patent and normal Anatomic variants: None significant. Probable venous angioma right posterior parietal brain. Delayed phase: No abnormal enhancement Review of the MIP images confirms the above findings IMPRESSION: No large or medium vessel intracranial stenosis or occlusion. 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 Probable venous angioma right posterior parietal region incidentally noted.  Atherosclerotic disease at both carotid bifurcation regions. No measurable stenosis on the right. 20% stenosis of the proximal ICA on the left. Though there is no flow limiting stenosis, irregular plaque could possibly serve as a source of emboli. However, 1 would not expect embolic disease throughout all vascular territories as shown on the previous MRI. Therefore, most likely source would be the heart or ascending aorta. Electronically Signed   By: Nelson Chimes M.D.   On: 07/28/2017 11:19   Ct Angio Neck W Or Wo Contrast  Result Date: 07/28/2017 CLINICAL DATA:  New onset facial droop. Sepsis. Multiple small infarctions by MRI 2 days ago. Septic emboli suspected. EXAM: CT ANGIOGRAPHY HEAD AND NECK TECHNIQUE: Multidetector CT imaging of the head and neck was performed using the standard protocol during bolus administration of  intravenous contrast. Multiplanar CT image reconstructions and MIPs were obtained to evaluate the vascular anatomy. Carotid stenosis measurements (when applicable) are obtained utilizing NASCET criteria, using the distal internal carotid diameter as the denominator. CONTRAST:  148m ISOVUE-370 IOPAMIDOL (ISOVUE-370) INJECTION 76% COMPARISON:  CT and MR exams 07/26/2017 FINDINGS: CT HEAD FINDINGS Brain: Generalized atrophy. Small infarctions seen previously cannot be specifically resolved. No evidence of large acute infarction, mass lesion, hemorrhage, hydrocephalus or extra-axial collection. Physiologic basal ganglia calcification. Old white matter infarction left forceps major. Vascular: There is atherosclerotic calcification of the major vessels at the base of the brain. Skull: Negative Sinuses: No acute sinusitis. Chronic mucoperiosteal thickening of the maxillary sinuses. Orbits: Negative Review of the MIP images confirms the above findings CTA NECK FINDINGS Aortic arch: Aortic atherosclerosis.  No aneurysm or dissection. Right carotid system: Common carotid artery shows atherosclerotic disease but no flow limiting stenosis. Stenosis of 30% in the midportion. Advanced calcified plaque at the carotid bifurcation and proximal ICA. Minimal diameter is 5 mm, therefore there is no stenosis. Cervical ICA is widely patent. Focal calcified plaque just beneath the skullbase but with narrowing of only 20%. Left carotid system: Common carotid artery shows atherosclerotic plaque but without narrowing of more than 20%. Complex calcified plaque at the carotid bifurcation and proximal ICA. Minimal proximal ICA diameter 3.2 mm. Compared to a more distal cervical ICA diameter of 4 mm, this indicates a 20% stenosis. Vertebral arteries: Both vertebral artery origins are patent. Both vertebral arteries are patent through the cervical region. The right vertebral artery is a very small vessel. Skeleton: Ordinary cervical spondylosis.  Other neck: No mass or lymphadenopathy. Upper chest: Areas of patchy pulmonary opacity. Layering pleural effusions. Findings could represent infiltrate or ARDS related to sepsis. Review of the MIP images confirms the above findings CTA HEAD FINDINGS Anterior circulation: Both internal carotid arteries are patent through the skullbase and siphon regions. No siphon stenosis. The anterior and middle cerebral vessels are patent without proximal stenosis, aneurysm or vascular malformation. No missing Delonta Yohannes vessels are identified. Posterior circulation: Both vertebral arteries are patent to the basilar. The left vertebral artery is dominant. No basilar stenosis. Posterior circulation Eboney Claybrook vessels are patent. Posterior cerebral arteries receive most of there supply from the anterior circulation. Venous sinuses: Patent and normal Anatomic variants: None significant. Probable venous angioma right posterior parietal brain. Delayed phase: No abnormal enhancement Review of the MIP images confirms the above findings IMPRESSION: No large or medium vessel intracranial stenosis or occlusion. 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 Probable venous angioma right posterior parietal region incidentally noted. Atherosclerotic disease at both carotid bifurcation regions. No measurable stenosis on the  right. 20% stenosis of the proximal ICA on the left. Though there is no flow limiting stenosis, irregular plaque could possibly serve as a source of emboli. However, 1 would not expect embolic disease throughout all vascular territories as shown on the previous MRI. Therefore, most likely source would be the heart or ascending aorta. Electronically Signed   By: Nelson Chimes M.D.   On: 07/28/2017 11:19   Dg Chest Port 1 View  Result Date: 07/28/2017 CLINICAL DATA:  Acute respiratory failure. EXAM: PORTABLE CHEST 1 VIEW COMPARISON:  Radiograph July 27, 2017. FINDINGS: Stable  cardiomegaly. Endotracheal and nasogastric tubes are unchanged in position. No definite pneumothorax is noted. Right lung is clear. Left basilar opacity is noted concerning for atelectasis or pneumonia with mild associated pleural effusion. Old left rib fractures are noted. IMPRESSION: Stable support apparatus. Left basilar atelectasis or pneumonia is noted with mild associated pleural effusion. Electronically Signed   By: Marijo Conception, M.D.   On: 07/28/2017 07:47   Dg Chest Port 1 View  Result Date: 07/27/2017 CLINICAL DATA:  Hypoxia EXAM: PORTABLE CHEST 1 VIEW COMPARISON:  July 27, 2017 study obtained earlier in the day FINDINGS: Endotracheal tube tip is 5.1 cm above the carina. Nasogastric tube tip and side port are below the diaphragm. No pneumothorax. There is atelectatic change in the left base with small left pleural effusion. The lungs elsewhere are clear. Heart is mildly enlarged with pulmonary vascularity within normal limits. No adenopathy. There is aortic atherosclerosis. No evident bone lesions. IMPRESSION: Tube positions as described without pneumothorax. Left base atelectasis with small left pleural effusion. No edema or consolidation evident. Heart mildly enlarged but stable. There is aortic atherosclerosis. Aortic Atherosclerosis (ICD10-I70.0). Electronically Signed   By: Lowella Grip III M.D.   On: 07/27/2017 15:38   Dg Chest Port 1 View  Result Date: 07/27/2017 CLINICAL DATA:  Endotracheal tube placement. EXAM: PORTABLE CHEST 1 VIEW COMPARISON:  07/25/2017 . FINDINGS: Endotracheal tube noted 10.3 cm above the carina at the thoracic inlet. Advancement of approximately 5 cm should be considered. Cardiomegaly with pulmonary vascular prominence, bilateral interstitial prominence, bilateral pleural effusions again noted. Findings consistent CHF. IMPRESSION: 1. Endotracheal tube noted 10.3 cm above the carina at the thoracic inlet. Advancement of approximately 5 cm should be  considered. 2. Persistent cardiomegaly with bilateral from interstitial prominence of small pleural effusions consistent persistent CHF. Similar findings noted on prior exam. These results will be called to the ordering clinician or representative by the Radiologist Assistant, and communication documented in the PACS or zVision Dashboard. Electronically Signed   By: Marcello Moores  Register   On: 07/27/2017 09:00   Dg Abd Portable 1v  Result Date: 07/27/2017 CLINICAL DATA:  Assess nasogastric tube. EXAM: PORTABLE ABDOMEN - 1 VIEW COMPARISON:  CT abdomen and pelvis August 18, 2013 FINDINGS: Nasogastric tube tip and side-port project in proximal stomach. Included bowel gas pattern is nondilated and nonobstructive. Abdominal herniorrhaphy. No intra-abdominal mass effect or pathologic calcifications. Retrocardiac consolidation. Soft tissue planes and included osseous structures are nonacute. IMPRESSION: Nasogastric tube tip projects in proximal stomach. Electronically Signed   By: Elon Alas M.D.   On: 07/27/2017 15:50    Cardiac Studies     Patient Profile     56 y.o. male with a hx of diastolic CHF, AAA, COPD, DM type 2, tobacco use and alcohol use now with admit for HF and Bacteremia & endocarditis.      Assessment & Plan    1. Acute diastolic  HF combined with MR - negative 532m yesterday, negative 6.4 liters since admission. He is on lasix 41mbid IV, stable Cr with mild uptrend in Cr. - TEE with LVEF 60-65%  - continue IV diuresis today   2. Mitral regurgitation/MV endocarditis - TEE yesterday with Large 2-3 cm mass of the A3 segment of the anterior   leaflet, prolapsing across the posterior leaflet. There is a   distinct posterior jet which is centrally directed of severe   regurgitation, suggestive of perforation. There is dropout seen   through the vegetation or in the area of the P3 scallop - not   entirely clear, but concerning for possible perforation. There is   vegetation  to a lesser extent on the posterior leaflet tip. - abx per primary team - followed by CT surgery, possible MV replacement once medically appropriate (cultures cleared, EtOH withdrawal resolved, extubated, euvolemic).  - will need RHC/LHC before MV surgery.    3. MRSA bacteremia - with MV vegetation and moderate to severe MR -abx per ID  4. Multiple bilateral puctate strokes - probable septic emboli  5. EtOH withdrawal - per primary team  For questions or updates, please contact [facility] consult [email or web address] for contact info under Cardiology/STEMI.      SiMerrily PewMD  07/29/2017, 7:27 AM

## 2017-07-29 NOTE — Progress Notes (Signed)
Interim note   Initially consulted for infarcts on MRI - these are septic emboli in the setting of endocarditis.  Stroke burden is small and AMS likely due to MRSA bacteremia/possible CNS involvement.  Clinically patient is very sick and understand high risk for surgical intervention of endocarditis.  Reviewed CTA - no obvious evidence of mycotic aneurysms. Ok from Neurology side for surgery if a candidate.  Please do not start ASA,  Management is treatment of endocarditis.  Neurology will sign off for now, please do reconsult if you have any questions.

## 2017-07-30 ENCOUNTER — Inpatient Hospital Stay (HOSPITAL_COMMUNITY): Payer: Medicaid Other

## 2017-07-30 LAB — CBC WITH DIFFERENTIAL/PLATELET
BASOS ABS: 0 10*3/uL (ref 0.0–0.1)
BASOS PCT: 0 %
EOS ABS: 0.1 10*3/uL (ref 0.0–0.7)
EOS PCT: 1 %
HCT: 32.1 % — ABNORMAL LOW (ref 39.0–52.0)
Hemoglobin: 10.2 g/dL — ABNORMAL LOW (ref 13.0–17.0)
LYMPHS PCT: 10 %
Lymphs Abs: 1.1 10*3/uL (ref 0.7–4.0)
MCH: 31.3 pg (ref 26.0–34.0)
MCHC: 31.8 g/dL (ref 30.0–36.0)
MCV: 98.5 fL (ref 78.0–100.0)
MONO ABS: 0.5 10*3/uL (ref 0.1–1.0)
Monocytes Relative: 5 %
Neutro Abs: 8.9 10*3/uL — ABNORMAL HIGH (ref 1.7–7.7)
Neutrophils Relative %: 84 %
PLATELETS: 222 10*3/uL (ref 150–400)
RBC: 3.26 MIL/uL — AB (ref 4.22–5.81)
RDW: 14.2 % (ref 11.5–15.5)
WBC: 10.7 10*3/uL — AB (ref 4.0–10.5)

## 2017-07-30 LAB — GLUCOSE, CAPILLARY
GLUCOSE-CAPILLARY: 140 mg/dL — AB (ref 65–99)
GLUCOSE-CAPILLARY: 163 mg/dL — AB (ref 65–99)
GLUCOSE-CAPILLARY: 169 mg/dL — AB (ref 65–99)
GLUCOSE-CAPILLARY: 181 mg/dL — AB (ref 65–99)
Glucose-Capillary: 163 mg/dL — ABNORMAL HIGH (ref 65–99)
Glucose-Capillary: 153 mg/dL — ABNORMAL HIGH (ref 65–99)

## 2017-07-30 LAB — HEPATIC FUNCTION PANEL
ALBUMIN: 2.1 g/dL — AB (ref 3.5–5.0)
ALK PHOS: 207 U/L — AB (ref 38–126)
ALT: 38 U/L (ref 17–63)
AST: 33 U/L (ref 15–41)
BILIRUBIN DIRECT: 0.6 mg/dL — AB (ref 0.1–0.5)
BILIRUBIN INDIRECT: 0.8 mg/dL (ref 0.3–0.9)
BILIRUBIN TOTAL: 1.4 mg/dL — AB (ref 0.3–1.2)
Total Protein: 7.9 g/dL (ref 6.5–8.1)

## 2017-07-30 LAB — LACTIC ACID, PLASMA: Lactic Acid, Venous: 1 mmol/L (ref 0.5–1.9)

## 2017-07-30 LAB — CULTURE, BLOOD (ROUTINE X 2)
SPECIAL REQUESTS: ADEQUATE
Special Requests: ADEQUATE

## 2017-07-30 LAB — BASIC METABOLIC PANEL
ANION GAP: 6 (ref 5–15)
BUN: 42 mg/dL — ABNORMAL HIGH (ref 6–20)
CO2: 31 mmol/L (ref 22–32)
CREATININE: 0.79 mg/dL (ref 0.61–1.24)
Calcium: 9 mg/dL (ref 8.9–10.3)
Chloride: 106 mmol/L (ref 101–111)
Glucose, Bld: 190 mg/dL — ABNORMAL HIGH (ref 65–99)
Potassium: 4.1 mmol/L (ref 3.5–5.1)
SODIUM: 143 mmol/L (ref 135–145)

## 2017-07-30 LAB — PROTIME-INR
INR: 1.14
PROTHROMBIN TIME: 14.5 s (ref 11.4–15.2)

## 2017-07-30 LAB — HEPATITIS C ANTIBODY: HCV Ab: 0.2 s/co ratio (ref 0.0–0.9)

## 2017-07-30 LAB — PHOSPHORUS: PHOSPHORUS: 4.2 mg/dL (ref 2.5–4.6)

## 2017-07-30 LAB — TRIGLYCERIDES: TRIGLYCERIDES: 410 mg/dL — AB (ref <150)

## 2017-07-30 LAB — VANCOMYCIN, TROUGH: VANCOMYCIN TR: 21 ug/mL — AB (ref 15–20)

## 2017-07-30 LAB — MAGNESIUM: MAGNESIUM: 1.9 mg/dL (ref 1.7–2.4)

## 2017-07-30 MED ORDER — VANCOMYCIN HCL 10 G IV SOLR
1750.0000 mg | INTRAVENOUS | Status: DC
  Administered 2017-07-30 – 2017-07-31 (×2): 1750 mg via INTRAVENOUS
  Filled 2017-07-30 (×2): qty 1750

## 2017-07-30 MED ORDER — FENTANYL NICU BOLUS VIA INFUSION: 100.0000 ug | Freq: Once | INTRAVENOUS | Status: DC

## 2017-07-30 MED ORDER — FENTANYL BOLUS VIA INFUSION
100.0000 ug | Freq: Once | INTRAVENOUS | Status: AC
  Administered 2017-07-30: 100 ug via INTRAVENOUS
  Filled 2017-07-30: qty 100

## 2017-07-30 MED ORDER — FENTANYL BOLUS VIA INFUSION
100.0000 ug | Freq: Once | INTRAVENOUS | Status: DC
  Filled 2017-07-30: qty 100

## 2017-07-30 MED ORDER — MAGNESIUM SULFATE 2 GM/50ML IV SOLN
2.0000 g | Freq: Once | INTRAVENOUS | Status: AC
  Administered 2017-07-30: 2 g via INTRAVENOUS
  Filled 2017-07-30: qty 50

## 2017-07-30 MED ORDER — MIDAZOLAM HCL 2 MG/2ML IJ SOLN
4.0000 mg | Freq: Once | INTRAMUSCULAR | Status: AC
  Administered 2017-07-30: 4 mg via INTRAVENOUS

## 2017-07-30 MED ORDER — MIDAZOLAM HCL 2 MG/2ML IJ SOLN
INTRAMUSCULAR | Status: AC
  Filled 2017-07-30: qty 4

## 2017-07-30 MED ORDER — FUROSEMIDE 10 MG/ML IJ SOLN
40.0000 mg | Freq: Once | INTRAMUSCULAR | Status: AC
  Administered 2017-07-30: 40 mg via INTRAVENOUS

## 2017-07-30 MED ORDER — FUROSEMIDE 10 MG/ML IJ SOLN
40.0000 mg | Freq: Every day | INTRAMUSCULAR | Status: DC
  Administered 2017-07-30: 40 mg via INTRAVENOUS
  Filled 2017-07-30 (×2): qty 4

## 2017-07-30 NOTE — Progress Notes (Signed)
Progress Note  Patient Name: Reginald [name] Date of Encounter: 07/30/2017   Subjective   No events overnight  Inpatient Medications    Scheduled Meds: . chlorhexidine gluconate (MEDLINE KIT)  15 mL Mouth Rinse BID  . Chlorhexidine Gluconate Cloth  6 each Topical Q0600  . enoxaparin (LOVENOX) injection  60 mg Subcutaneous Q24H  . feeding supplement (PRO-STAT SUGAR FREE 64)  30 mL Per Tube Daily  . feeding supplement (PRO-STAT SUGAR FREE 64)  60 mL Per Tube TID  . feeding supplement (VITAL HIGH PROTEIN)  1,000 mL Per Tube Q24H  . fentaNYL (SUBLIMAZE) injection  50 mcg Intravenous Once  . folic acid  1 mg Intravenous Daily  . furosemide  40 mg Intravenous BID  . insulin aspart  0-20 Units Subcutaneous Q4H  . insulin aspart  6 Units Subcutaneous Q4H  . mouth rinse  15 mL Mouth Rinse QID  . mupirocin ointment  1 application Nasal BID  . pantoprazole (PROTONIX) IV  40 mg Intravenous QHS  . thiamine  100 mg Intravenous Daily   Or  . thiamine  100 mg Oral Daily   Continuous Infusions: . sodium chloride 20 mL/hr at 07/30/17 0600  . fentaNYL infusion INTRAVENOUS 50 mcg/hr (07/30/17 0600)  . propofol (DIPRIVAN) infusion 50 mcg/kg/min (07/30/17 0604)  . vancomycin     PRN Meds: acetaminophen **OR** acetaminophen, fentaNYL, fentaNYL (SUBLIMAZE) injection, hydrALAZINE, ipratropium-albuterol, [DISCONTINUED] ondansetron **OR** ondansetron (ZOFRAN) IV   Vital Signs    Vitals:   07/30/17 0413 07/30/17 0500 07/30/17 0527 07/30/17 0600  BP: (!) 152/85 (!) 150/87  128/69  Pulse: 73 71  70  Resp: 19 17  (!) 21  Temp:      TempSrc:      SpO2: 96% 95%  94%  Weight:   251 lb 5.2 oz (114 kg)   Height:        Intake/Output Summary (Last 24 hours) at 07/30/2017 0733 Last data filed at 07/30/2017 0600 Gross per 24 hour  Intake 2555.66 ml  Output 3550 ml  Net -994.34 ml   Filed Weights   07/28/17 0500 07/29/17 0400 07/30/17 0527  Weight: 259 lb 4.2 oz (117.6 kg) 252 lb 10.4 oz  (114.6 kg) 251 lb 5.2 oz (114 kg)    Telemetry    SR - Personally Reviewed  ECG    n/a  Physical Exam   GEN: No acute distress.   Neck: No JVD Cardiac: RRR, 2/6 systolic murmur at apex.  Respiratory: Clear to auscultation bilaterally. GI: Soft, nontender, non-distended  MS: No edema; No deformity. Neuro: sedated,intubated Psych: cannot assess, sedated and intubated  Labs    Chemistry Recent Labs  Lab 07/27/17 0322 07/28/17 0501 07/29/17 0233 07/30/17 0228  NA 134* 137 140 143  K 4.2 4.0 4.1 4.1  CL 98* 101 104 106  CO2 '30 30 31 31  '$ GLUCOSE 200* 207* 202* 190*  BUN 21* 38* 39* 42*  CREATININE 0.61 0.81 0.69 0.79  CALCIUM 8.4* 8.4* 8.7* 9.0  PROT 6.7 6.8  --  7.9  ALBUMIN 2.0* 1.9*  --  2.1*  AST 40 30  --  33  ALT 50 39  --  38  ALKPHOS 217* 187*  --  207*  BILITOT 2.0* 1.7*  --  1.4*  GFRNONAA >60 >60 >60 >60  GFRAA >60 >60 >60 >60  ANIONGAP '6 6 5 6     '$ Hematology Recent Labs  Lab 07/27/17 0322 07/29/17 0233 07/30/17 0228  WBC 13.0* 10.4  10.7*  RBC 3.65* 3.35* 3.26*  HGB 11.4* 10.6* 10.2*  HCT 34.1* 32.6* 32.1*  MCV 93.4 97.3 98.5  MCH 31.2 31.6 31.3  MCHC 33.4 32.5 31.8  RDW 13.9 14.1 14.2  PLT 156 184 222    Cardiac EnzymesNo results for input(s): TROPONINI in the last 168 hours. No results for input(s): TROPIPOC in the last 168 hours.   BNP Recent Labs  Lab 07/26/17 0340  BNP 104.6*     DDimer No results for input(s): DDIMER in the last 168 hours.   Radiology    Ct Angio Head W Or Wo Contrast  Result Date: 07/28/2017 CLINICAL DATA:  New onset facial droop. Sepsis. Multiple small infarctions by MRI 2 days ago. Septic emboli suspected. EXAM: CT ANGIOGRAPHY HEAD AND NECK TECHNIQUE: Multidetector CT imaging of the head and neck was performed using the standard protocol during bolus administration of intravenous contrast. Multiplanar CT image reconstructions and MIPs were obtained to evaluate the vascular anatomy. Carotid stenosis  measurements (when applicable) are obtained utilizing NASCET criteria, using the distal internal carotid diameter as the denominator. CONTRAST:  11m ISOVUE-370 IOPAMIDOL (ISOVUE-370) INJECTION 76% COMPARISON:  CT and MR exams 07/26/2017 FINDINGS: CT HEAD FINDINGS Brain: Generalized atrophy. Small infarctions seen previously cannot be specifically resolved. No evidence of large acute infarction, mass lesion, hemorrhage, hydrocephalus or extra-axial collection. Physiologic basal ganglia calcification. Old white matter infarction left forceps major. Vascular: There is atherosclerotic calcification of the major vessels at the base of the brain. Skull: Negative Sinuses: No acute sinusitis. Chronic mucoperiosteal thickening of the maxillary sinuses. Orbits: Negative Review of the MIP images confirms the above findings CTA NECK FINDINGS Aortic arch: Aortic atherosclerosis.  No aneurysm or dissection. Right carotid system: Common carotid artery shows atherosclerotic disease but no flow limiting stenosis. Stenosis of 30% in the midportion. Advanced calcified plaque at the carotid bifurcation and proximal ICA. Minimal diameter is 5 mm, therefore there is no stenosis. Cervical ICA is widely patent. Focal calcified plaque just beneath the skullbase but with narrowing of only 20%. Left carotid system: Common carotid artery shows atherosclerotic plaque but without narrowing of more than 20%. Complex calcified plaque at the carotid bifurcation and proximal ICA. Minimal proximal ICA diameter 3.2 mm. Compared to a more distal cervical ICA diameter of 4 mm, this indicates a 20% stenosis. Vertebral arteries: Both vertebral artery origins are patent. Both vertebral arteries are patent through the cervical region. The right vertebral artery is a very small vessel. Skeleton: Ordinary cervical spondylosis. Other neck: No mass or lymphadenopathy. Upper chest: Areas of patchy pulmonary opacity. Layering pleural effusions. Findings could  represent infiltrate or ARDS related to sepsis. Review of the MIP images confirms the above findings CTA HEAD FINDINGS Anterior circulation: Both internal carotid arteries are patent through the skullbase and siphon regions. No siphon stenosis. The anterior and middle cerebral vessels are patent without proximal stenosis, aneurysm or vascular malformation. No missing Esau Fridman vessels are identified. Posterior circulation: Both vertebral arteries are patent to the basilar. The left vertebral artery is dominant. No basilar stenosis. Posterior circulation Kyro Joswick vessels are patent. Posterior cerebral arteries receive most of there supply from the anterior circulation. Venous sinuses: Patent and normal Anatomic variants: None significant. Probable venous angioma right posterior parietal brain. Delayed phase: No abnormal enhancement Review of the MIP images confirms the above findings IMPRESSION: No large or medium vessel intracranial stenosis or occlusion. 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 Probable venous angioma right posterior parietal  region incidentally noted. Atherosclerotic disease at both carotid bifurcation regions. No measurable stenosis on the right. 20% stenosis of the proximal ICA on the left. Though there is no flow limiting stenosis, irregular plaque could possibly serve as a source of emboli. However, 1 would not expect embolic disease throughout all vascular territories as shown on the previous MRI. Therefore, most likely source would be the heart or ascending aorta. Electronically Signed   By: Nelson Chimes M.D.   On: 07/28/2017 11:19   Ct Angio Neck W Or Wo Contrast  Result Date: 07/28/2017 CLINICAL DATA:  New onset facial droop. Sepsis. Multiple small infarctions by MRI 2 days ago. Septic emboli suspected. EXAM: CT ANGIOGRAPHY HEAD AND NECK TECHNIQUE: Multidetector CT imaging of the head and neck was performed using the standard protocol during  bolus administration of intravenous contrast. Multiplanar CT image reconstructions and MIPs were obtained to evaluate the vascular anatomy. Carotid stenosis measurements (when applicable) are obtained utilizing NASCET criteria, using the distal internal carotid diameter as the denominator. CONTRAST:  13m ISOVUE-370 IOPAMIDOL (ISOVUE-370) INJECTION 76% COMPARISON:  CT and MR exams 07/26/2017 FINDINGS: CT HEAD FINDINGS Brain: Generalized atrophy. Small infarctions seen previously cannot be specifically resolved. No evidence of large acute infarction, mass lesion, hemorrhage, hydrocephalus or extra-axial collection. Physiologic basal ganglia calcification. Old white matter infarction left forceps major. Vascular: There is atherosclerotic calcification of the major vessels at the base of the brain. Skull: Negative Sinuses: No acute sinusitis. Chronic mucoperiosteal thickening of the maxillary sinuses. Orbits: Negative Review of the MIP images confirms the above findings CTA NECK FINDINGS Aortic arch: Aortic atherosclerosis.  No aneurysm or dissection. Right carotid system: Common carotid artery shows atherosclerotic disease but no flow limiting stenosis. Stenosis of 30% in the midportion. Advanced calcified plaque at the carotid bifurcation and proximal ICA. Minimal diameter is 5 mm, therefore there is no stenosis. Cervical ICA is widely patent. Focal calcified plaque just beneath the skullbase but with narrowing of only 20%. Left carotid system: Common carotid artery shows atherosclerotic plaque but without narrowing of more than 20%. Complex calcified plaque at the carotid bifurcation and proximal ICA. Minimal proximal ICA diameter 3.2 mm. Compared to a more distal cervical ICA diameter of 4 mm, this indicates a 20% stenosis. Vertebral arteries: Both vertebral artery origins are patent. Both vertebral arteries are patent through the cervical region. The right vertebral artery is a very small vessel. Skeleton:  Ordinary cervical spondylosis. Other neck: No mass or lymphadenopathy. Upper chest: Areas of patchy pulmonary opacity. Layering pleural effusions. Findings could represent infiltrate or ARDS related to sepsis. Review of the MIP images confirms the above findings CTA HEAD FINDINGS Anterior circulation: Both internal carotid arteries are patent through the skullbase and siphon regions. No siphon stenosis. The anterior and middle cerebral vessels are patent without proximal stenosis, aneurysm or vascular malformation. No missing Jolin Benavides vessels are identified. Posterior circulation: Both vertebral arteries are patent to the basilar. The left vertebral artery is dominant. No basilar stenosis. Posterior circulation Donevan Biller vessels are patent. Posterior cerebral arteries receive most of there supply from the anterior circulation. Venous sinuses: Patent and normal Anatomic variants: None significant. Probable venous angioma right posterior parietal brain. Delayed phase: No abnormal enhancement Review of the MIP images confirms the above findings IMPRESSION: No large or medium vessel intracranial stenosis or occlusion. 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 Probable venous angioma right posterior parietal region incidentally noted. Atherosclerotic disease at both carotid bifurcation regions. No measurable  stenosis on the right. 20% stenosis of the proximal ICA on the left. Though there is no flow limiting stenosis, irregular plaque could possibly serve as a source of emboli. However, 1 would not expect embolic disease throughout all vascular territories as shown on the previous MRI. Therefore, most likely source would be the heart or ascending aorta. Electronically Signed   By: Nelson Chimes M.D.   On: 07/28/2017 11:19   Dg Chest Port 1 View  Result Date: 07/30/2017 CLINICAL DATA:  Acute respiratory distress.  Endotracheal placement. EXAM: PORTABLE CHEST 1 VIEW COMPARISON:   07/28/2017 FINDINGS: Endotracheal tube tip is 4 cm above the carina. Nasogastric tube enters the abdomen. There is worsening density at both lung bases probably due to a combination of pleural fluid and lower lobe atelectasis and or infiltrate. Upper lungs remain clear. IMPRESSION: Worsening density in the lower chest consistent with lower lobe volume loss or infiltrate probably associated with effusions. Electronically Signed   By: Nelson Chimes M.D.   On: 07/30/2017 06:45    Cardiac Studies    Patient Profile     56 y.o.malewith a hx of diastolic CHF, AAA, COPD, DM type 2, tobacco use and alcohol usenow with admit for HF and Bacteremia & endocarditis.    Assessment & Plan    1. Acute diastolic HF combined with MR - negative 1000 mL yesterday, negative 7.4 liters since admission. He is on lasix '40mg'$  bid IV, stable Cr with mild uptrend in Cr. - uptrend in Cr and BUN, will change IV lasix to '40mg'$  daily.    2. Mitral regurgitation/MV endocarditis - MV vegetation by recent TEE, severe MR - abx per ID. Seen by CT surgery, possible MVR once cultures negative, EtOH withdrawal resolved, euvolemic, dental consult - will need RHC/LHC once medically improved prior to MV surgery   3. MRSA bacteremia - with MV vegetation and moderate to severe MR -abx per ID  4. Multiple bilateral puctate strokes - probable septic emboli  5. EtOH withdrawal - per primary team    For questions or updates, please contact [place] Please consult [email or web address] for contact info under Cardiology/STEMI.      Merrily Pew, MD  07/30/2017, 7:33 AM

## 2017-07-30 NOTE — Progress Notes (Addendum)
Pharmacy Antibiotic Note  Reginald [name] is a 56 y.o. male admitted on 07/25/2017 with bacteremia.  Pharmacy has been consulted for Vancomycin dosing.  Plan: Change vancomycin to 1750mg  iv q24hr, next dose at 1000 12/9 Goal AUC = 400 - 500 for all indications, except meningitis (goal AUC > 500 and Cmin 15-20 mcg/mL)   Height: 5\' 9"  (175.3 cm) Weight: 252 lb 10.4 oz (114.6 kg) IBW/kg (Calculated) : 70.7  Temp (24hrs), Avg:98.3 F (36.8 C), Min:97.1 F (36.2 C), Max:99.4 F (37.4 C)  Recent Labs  Lab 07/26/17 0340  07/27/17 0322 07/28/17 0501  07/29/17 0233 07/29/17 0637 07/29/17 1827 07/30/17 0228  WBC 12.5*  --  13.0*  --   --  10.4  --   --  10.7*  CREATININE 0.73  --  0.61 0.81  --  0.69  --   --  0.79  LATICACIDVEN  --   --   --   --   --   --   --   --  1.0  VANCOTROUGH  --    < >  --   --   --  38*  --   --  21*  VANCOPEAK  --   --   --   --    < >  --  38 37  --    < > = values in this interval not displayed.    Estimated Creatinine Clearance: 127.2 mL/min (by C-G formula based on SCr of 0.79 mg/dL).    Allergies  Allergen Reactions  . Morphine And Related     Antimicrobials this admission: 12/4 [facility] >>  Dose adjustments this admission: [facility]: 12/1 VT at 03:09 = 8.7 12/3 VT at 03:11 = 15.8 [facility] was on Vanc 2g q12h starting 12/2.   Per admitting RN, 2g dose was hanging on transfer. 12/5 VRm at 12:00 = 15 - start 1500mg  q12h  12/7 Peak at 1728 = 42, Trough at 0233 = 38 but was drawn while vanc was infusing Vanc AUC calc--snip for 12/8 1455 dose and Peak/trough Alt dosing 1gm iv q12hr (485, Cmax 29--Cmin13.3)    Microbiology results: 11/29, 12[facility]x: MRSA (S bactrim, vanc, dapto, linezolid, TCN) - per Dr. Feliz BeamSnider's note 12/4 BCx: GPC (BCID = MRSA) Vanc MIC < 0.5 12/5 HIV Ab: negative 12/6 MRSA PCR: positive 12/7 BCx: 2 of 2 GPC clusters  Thank you for allowing  pharmacy to be a part of this patient's care.  Reginald [name], Reginald [name] 07/30/2017 5:01 AM

## 2017-07-30 NOTE — Progress Notes (Signed)
RN called stating that the Pt's SATS had decreased. RT bagged and lavaged the Pt 2 times and did a recruitment 2 times. RT also changed the SAT probe and put it on the Pt's ear. Pt's SAT is 94% with FIO2 on 75%. RT placed pt on full support.

## 2017-07-30 NOTE — Progress Notes (Signed)
PULMONARY / CRITICAL CARE MEDICINE   Name: Reginald [name] MRN: [id number] DOB: 1961-08-11    ADMISSION DATE:  07/25/2017 CONSULTATION DATE:  12/6  REFERRING MD:  Dr Lowell Guitar [facility]  CHIEF COMPLAINT:  agitation  brief 56 year old male with past medical history as below, which is significant for COPD, diabetes mellitus, alcohol abuse, diastolic CHF, and AAA who was initially admitted to [facility] on 11/29 with nonspecific complaints of generalized weakness, low-grade fever, and muscle aches.  Blood cultures done at admission grew out MRSA and he was started on vancomycin.  2D echocardiogram at that time demonstrated a mitral valve vegetation.  He did require ICU transfer during his time at [facility] for Precedex infusion with presumed alcohol withdrawal was weaned off on 12/3.  The patient was transferred to [facility] for infectious diseases consultation.  On 12/4 he was found to have a new facial droop.  MRI was done and demonstrated multiple infarcts to both cerebral hemispheres consistent with septic emboli.  12/5 late p.m. he developed agitation which is refractory to repeated doses of Ativan.  He was started on Precedex infusion.  PCCM asked to see in consultation.    STUDIES:     [facility] BCID: staph>aureus > methicillin resistance.  BCx2 12/4 >>>SA>>> BCX2 12/7>>>  ANTIBIOTICS: Vancomycin 12/4 >  SIGNIFICANT EVENTS: 11/29- blood cx x 2 mrsa  12/3 blood cx 2 MRSA = bactrim S, vanco S, dapto S, linezolid, S, tetra  12/1  blood cx x 2 mrsa 07/25/2017 - admit [facility] from [place] 12/5  -MRI brain > multiple small areas of restricted diffusion in the brain bilaterally most compatible with acute infarct from emboli. 12/6>>> ETT  07/29/17 -  - not on pressors. Making urine. 40%fio2 . On diprivan. Got agitated and desaturaetd during  WUA/SBT. Back on vent   SUBJECTIVE/OVERNIGHT/INTERVAL HX 12./9 - remain on vent on diprivan. WUA in progress. Doing PSV. On TF  VITAL  SIGNS: BP 122/77   Pulse 67   Temp 98.4 F (36.9 C) (Oral)   Resp (!) 21   Ht 5\' 9"  (1.753 m)   Wt 114 kg (251 lb 5.2 oz)   SpO2 96%   BMI 37.11 kg/m   HEMODYNAMICS:    VENTILATOR SETTINGS: Vent Mode: PSV;CPAP FiO2 (%):  [40 %-60 %] 40 % Set Rate:  [16 bmp-21 bmp] 21 bmp Vt Set:  [560 mL] 560 mL PEEP:  [5 cmH20-8 cmH20] 5 cmH20 Pressure Support:  [10 cmH20] 10 cmH20 Plateau Pressure:  [20 cmH20-25 cmH20] 20 cmH20  INTAKE / OUTPUT:  Intake/Output Summary (Last 24 hours) at 07/30/2017 0915 Last data filed at 07/30/2017 0800 Gross per 24 hour  Intake 2518.66 ml  Output 3550 ml  Net -1031.34 ml     PHYSICAL EXAMINATION:   General Appearance:    Looks criticall ill OBESE - yes  Head:    Normocephalic, without obvious abnormality, atraumatic  Eyes:    PERRL - yes, conjunctiva/corneas - clear      Ears:    Normal external ear canals, both ears  Nose:   NG tube - no  Throat:  ETT TUBE - no , OG tube - yes  Neck:   Supple,  No enlargement/tenderness/nodules     Lungs:    bilateral UL crackles +, Ventilator   Synchrony - yes  Chest wall:    No deformity  Heart:    S1 and S2 normal, no murmur, CVP - no.  Pressors - no  Abdomen:  Soft, no masses, no organomegaly  Genitalia:    Not done  Rectal:   not done  Extremities:   Extremities- intacty     Skin:   Intact in exposed areas . Sacral area - no decub per RN     Neurologic:   Sedation - diprivan gtt -> RASS - -3 . Moves all 4s - yes. CAM-ICU - agitated yesterday on wua . Orientation - not      PULMONARY Recent Labs  Lab 07/27/17 0000 07/27/17 0730 07/27/17 1120 07/28/17 0330  PHART 7.505* 7.363 7.397 7.415  PCO2ART 40.2 58.3* 53.5* 49.2*  PO2ART 64.3* 146* 332* 112*  HCO3 31.4* 32.3* 32.2* 30.9*  O2SAT 92.3 98.6 99.6 98.0    CBC Recent Labs  Lab 07/27/17 0322 07/29/17 0233 07/30/17 0228  HGB 11.4* 10.6* 10.2*  HCT 34.1* 32.6* 32.1*  WBC 13.0* 10.4 10.7*  PLT 156 184 222     COAGULATION Recent Labs  Lab 07/30/17 0228  INR 1.14    CARDIAC  No results for input(s): TROPONINI in the last 168 hours. No results for input(s): PROBNP in the last 168 hours.   CHEMISTRY Recent Labs  Lab 07/26/17 0340 07/27/17 0322 07/27/17 1113 07/27/17 1701 07/28/17 0501 07/28/17 1728 07/29/17 0233 07/30/17 0228  NA 132* 134*  --   --  137  --  140 143  K 4.1 4.2  --   --  4.0  --  4.1 4.1  CL 94* 98*  --   --  101  --  104 106  CO2 30 30  --   --  30  --  31 31  GLUCOSE 221* 200*  --   --  207*  --  202* 190*  BUN 28* 21*  --   --  38*  --  39* 42*  CREATININE 0.73 0.61  --   --  0.81  --  0.69 0.79  CALCIUM 8.6* 8.4*  --   --  8.4*  --  8.7* 9.0  MG  --   --  2.0 1.9 2.1 2.0  --  1.9  PHOS  --   --  5.1* 5.3* 4.8* 4.6  --  4.2   Estimated Creatinine Clearance: 126.8 mL/min (by C-G formula based on SCr of 0.79 mg/dL).   LIVER Recent Labs  Lab 07/26/17 0340 07/27/17 0322 07/28/17 0501 07/30/17 0228  AST 46* 40 30 33  ALT 58 50 39 38  ALKPHOS 245* 217* 187* 207*  BILITOT 1.7* 2.0* 1.7* 1.4*  PROT 7.1 6.7 6.8 7.9  ALBUMIN 2.2* 2.0* 1.9* 2.1*  INR  --   --   --  1.14     INFECTIOUS Recent Labs  Lab 07/30/17 0228  LATICACIDVEN 1.0     ENDOCRINE CBG (last 3)  Recent Labs    07/29/17 2024 07/30/17 0004 07/30/17 0817  GLUCAP 144* 163* 140*         IMAGING x48h  - image(s) personally visualized  -   highlighted in bold Ct Angio Head W Or Wo Contrast  Result Date: 07/28/2017 CLINICAL DATA:  New onset facial droop. Sepsis. Multiple small infarctions by MRI 2 days ago. Septic emboli suspected. EXAM: CT ANGIOGRAPHY HEAD AND NECK TECHNIQUE: Multidetector CT imaging of the head and neck was performed using the standard protocol during bolus administration of intravenous contrast. Multiplanar CT image reconstructions and MIPs were obtained to evaluate the vascular anatomy. Carotid stenosis measurements (when applicable) are obtained  utilizing NASCET criteria, using  the distal internal carotid diameter as the denominator. CONTRAST:  ISOVUE-370 IOPAMIDOL (ISOVUE-370) INJECTION 76% COMPARISON:  CT and MR exams 07/26/2017 FINDINGS: CT HEAD FINDINGS Brain: Generalized atrophy. Small infarctions seen previously cannot be specifically resolved. No evidence of large acute infarction, mass lesion, hemorrhage, hydrocephalus or extra-axial collection. Physiologic basal ganglia calcification. Old white matter infarction left forceps major. Vascular: There is atherosclerotic calcification of the major vessels at the base of the brain. Skull: Negative Sinuses: No acute sinusitis. Chronic mucoperiosteal thickening of the maxillary sinuses. Orbits: Negative Review of the MIP images confirms the above findings CTA NECK FINDINGS Aortic arch: Aortic atherosclerosis.  No aneurysm or dissection. Right carotid system: Common carotid artery shows atherosclerotic disease but no flow limiting stenosis. Stenosis of 30% in the midportion. Advanced calcified plaque at the carotid bifurcation and proximal ICA. Minimal diameter is 5 mm, therefore there is no stenosis. Cervical ICA is widely patent. Focal calcified plaque just beneath the skullbase but with narrowing of only 20%. Left carotid system: Common carotid artery shows atherosclerotic plaque but without narrowing of more than 20%. Complex calcified plaque at the carotid bifurcation and proximal ICA. Minimal proximal ICA diameter 3.2 mm. Compared to a more distal cervical ICA diameter of 4 mm, this indicates a 20% stenosis. Vertebral arteries: Both vertebral artery origins are patent. Both vertebral arteries are patent through the cervical region. The right vertebral artery is a very small vessel. Skeleton: Ordinary cervical spondylosis. Other neck: No mass or lymphadenopathy. Upper chest: Areas of patchy pulmonary opacity. Layering pleural effusions. Findings could represent infiltrate or ARDS related to  sepsis. Review of the MIP images confirms the above findings CTA HEAD FINDINGS Anterior circulation: Both internal carotid arteries are patent through the skullbase and siphon regions. No siphon stenosis. The anterior and middle cerebral vessels are patent without proximal stenosis, aneurysm or vascular malformation. No missing branch vessels are identified. Posterior circulation: Both vertebral arteries are patent to the basilar. The left vertebral artery is dominant. No basilar stenosis. Posterior circulation branch vessels are patent. Posterior cerebral arteries receive most of there supply from the anterior circulation. Venous sinuses: Patent and normal Anatomic variants: None significant. Probable venous angioma right posterior parietal brain. Delayed phase: No abnormal enhancement Review of the MIP images confirms the above findings IMPRESSION: No large or medium vessel intracranial stenosis or occlusion. 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 Probable venous angioma right posterior parietal region incidentally noted. Atherosclerotic disease at both carotid bifurcation regions. No measurable stenosis on the right. 20% stenosis of the proximal ICA on the left. Though there is no flow limiting stenosis, irregular plaque could possibly serve as a source of emboli. However, 1 would not expect embolic disease throughout all vascular territories as shown on the previous MRI. Therefore, most likely source would be the heart or ascending aorta. Electronically Signed   By: Paulina Fusi M.D.   On: 07/28/2017 11:19   Ct Angio Neck W Or Wo Contrast  Result Date: 07/28/2017 CLINICAL DATA:  New onset facial droop. Sepsis. Multiple small infarctions by MRI 2 days ago. Septic emboli suspected. EXAM: CT ANGIOGRAPHY HEAD AND NECK TECHNIQUE: Multidetector CT imaging of the head and neck was performed using the standard protocol during bolus administration of intravenous  contrast. Multiplanar CT image reconstructions and MIPs were obtained to evaluate the vascular anatomy. Carotid stenosis measurements (when applicable) are obtained utilizing NASCET criteria, using the distal internal carotid diameter as the denominator. CONTRAST:  ISOVUE-370  IOPAMIDOL (ISOVUE-370) INJECTION 76% COMPARISON:  CT and MR exams 07/26/2017 FINDINGS: CT HEAD FINDINGS Brain: Generalized atrophy. Small infarctions seen previously cannot be specifically resolved. No evidence of large acute infarction, mass lesion, hemorrhage, hydrocephalus or extra-axial collection. Physiologic basal ganglia calcification. Old white matter infarction left forceps major. Vascular: There is atherosclerotic calcification of the major vessels at the base of the brain. Skull: Negative Sinuses: No acute sinusitis. Chronic mucoperiosteal thickening of the maxillary sinuses. Orbits: Negative Review of the MIP images confirms the above findings CTA NECK FINDINGS Aortic arch: Aortic atherosclerosis.  No aneurysm or dissection. Right carotid system: Common carotid artery shows atherosclerotic disease but no flow limiting stenosis. Stenosis of 30% in the midportion. Advanced calcified plaque at the carotid bifurcation and proximal ICA. Minimal diameter is 5 mm, therefore there is no stenosis. Cervical ICA is widely patent. Focal calcified plaque just beneath the skullbase but with narrowing of only 20%. Left carotid system: Common carotid artery shows atherosclerotic plaque but without narrowing of more than 20%. Complex calcified plaque at the carotid bifurcation and proximal ICA. Minimal proximal ICA diameter 3.2 mm. Compared to a more distal cervical ICA diameter of 4 mm, this indicates a 20% stenosis. Vertebral arteries: Both vertebral artery origins are patent. Both vertebral arteries are patent through the cervical region. The right vertebral artery is a very small vessel. Skeleton: Ordinary cervical spondylosis. Other neck:  No mass or lymphadenopathy. Upper chest: Areas of patchy pulmonary opacity. Layering pleural effusions. Findings could represent infiltrate or ARDS related to sepsis. Review of the MIP images confirms the above findings CTA HEAD FINDINGS Anterior circulation: Both internal carotid arteries are patent through the skullbase and siphon regions. No siphon stenosis. The anterior and middle cerebral vessels are patent without proximal stenosis, aneurysm or vascular malformation. No missing branch vessels are identified. Posterior circulation: Both vertebral arteries are patent to the basilar. The left vertebral artery is dominant. No basilar stenosis. Posterior circulation branch vessels are patent. Posterior cerebral arteries receive most of there supply from the anterior circulation. Venous sinuses: Patent and normal Anatomic variants: None significant. Probable venous angioma right posterior parietal brain. Delayed phase: No abnormal enhancement Review of the MIP images confirms the above findings IMPRESSION: No large or medium vessel intracranial stenosis or occlusion. 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 Probable venous angioma right posterior parietal region incidentally noted. Atherosclerotic disease at both carotid bifurcation regions. No measurable stenosis on the right. 20% stenosis of the proximal ICA on the left. Though there is no flow limiting stenosis, irregular plaque could possibly serve as a source of emboli. However, 1 would not expect embolic disease throughout all vascular territories as shown on the previous MRI. Therefore, most likely source would be the heart or ascending aorta. Electronically Signed   By: Paulina FusiMark  Shogry M.D.   On: 07/28/2017 11:19   Dg Chest Port 1 View  Result Date: 07/30/2017 CLINICAL DATA:  Acute respiratory distress.  Endotracheal placement. EXAM: PORTABLE CHEST 1 VIEW COMPARISON:  07/28/2017 FINDINGS: Endotracheal tube tip  is 4 cm above the carina. Nasogastric tube enters the abdomen. There is worsening density at both lung bases probably due to a combination of pleural fluid and lower lobe atelectasis and or infiltrate. Upper lungs remain clear. IMPRESSION: Worsening density in the lower chest consistent with lower lobe volume loss or infiltrate probably associated with effusions. Electronically Signed   By: Paulina FusiMark  Shogry M.D.   On: 07/30/2017 06:45  DISCUSSION: 56 year old male with MRSA bacteremia with mitral valve vegetation. Being treated with vancomycin and followed by ID. 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 Now intubated. Sedated overnight.   ASSESSMENT / PLAN:  MRSA bacteremia and mitral valve endocarditis -No history of IV drug abuse.  Source of nidus of infection not entirely clear but thoracic surgery also raises concern for necrotic/loose teeth   07/30/2017 - tmax 20F./ Not on pressors.   Plan Continue IV vancomycin per infectious disease direction Follow-up pending cultures Would be surgical candidate if: a) pulm status improved and can be extubated b) has dental consult and likely extractions c) has left and right heart cath d) has no worsening of LFTs and finally would add e) improvement in mental status.  Anti-infectives (From admission, onward)   Start     Dose/Rate Route Frequency Ordered Stop   07/30/17 1000  vancomycin (VANCOCIN) 1,750 mg in sodium chloride 0.9 % 500 mL IVPB     1,750 mg 250 mL/hr over 120 Minutes Intravenous Every 24 hours 07/30/17 0449     07/29/17 1500  vancomycin (VANCOCIN) 1,500 mg in sodium chloride 0.9 % 500 mL IVPB     1,500 mg 250 mL/hr over 120 Minutes Intravenous Every 12 hours 07/29/17 0704 07/29/17 1818   07/26/17 1400  vancomycin (VANCOCIN) 1,500 mg in sodium chloride 0.9 % 500 mL IVPB  Status:  Discontinued     1,500 mg 250 mL/hr over 120 Minutes Intravenous  Every 12 hours 07/26/17 1254 07/29/17 0247   07/25/17 2200  vancomycin (VANCOCIN) 2,500 mg in sodium chloride 0.9 % 500 mL IVPB  Status:  Discontinued     2,500 mg 250 mL/hr over 120 Minutes Intravenous  Once 07/25/17 2124 07/25/17 2210   07/25/17 2130  vancomycin (VANCOCIN) IVPB 1000 mg/200 mL premix  Status:  Discontinued     1,000 mg 200 mL/hr over 60 Minutes Intravenous  Once 07/25/17 2118 07/25/17 2123      #CNS Acute encephalopathy: likely multifactorial in the setting of ETOH abuse/withdrawal, septic embolic CVA, bacteremia 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 Handling diprivan well. CK nolrmal, lactate normal  Plan Cont thiamine and folate Cont diprivan ; check CK 07/31/17 again Cont supportive care  No aspirin per neuro    #RESP Acute hypoxemic respiratory failure in setting of pulmonary edema and ineffective airway protection  07/30/2017 - > does NOT meet criteria for SBT/Extubation in setting of Acute Respiratory Failure due to acute agitated encephalopathy  Plan Cont full vent support Cont scheduled lasix  PAD protocol; RAS goal -1 to -2; keep on prop for now Daily assessment for weaning    Hypertension Plan Lasix and PRN hydralazine   Abnormal LFTs Plan Trend  Possible hematuria Plan Await UA  DM with hyperglycemia Plan Ssi, add scheduled basal dosing   AAA Plan F/u CT 1 year   DVT prophylaxis: LMWH SUP: PPI  Diet: tubefeeds Activity: BR Disposition : ICU      The patient is critically ill with multiple organ systems failure and requires high complexity decision making for assessment and support, frequent evaluation and titration of therapies, application of advanced monitoring technologies and extensive interpretation of multiple databases.   Critical Care Time devoted to patient care services described in this note is  30  Minutes. This time reflects time of care of this signee Dr Kalman Shan. This  critical care time does not reflect procedure time, or teaching time or supervisory time of PA/NP/Med student/Med Resident etc but could involve care discussion time    Dr. Kalman ShanMurali Aleyza Salmi, M.D., [facility].C.C.P Pulmonary and Critical Care Medicine Staff Physician [facility] [place] [facility] Pager: [phone], If no answer or between  15:00h - 7:00h: call [phone]  07/30/2017 9:28 AM

## 2017-07-31 ENCOUNTER — Inpatient Hospital Stay (HOSPITAL_COMMUNITY): Payer: Medicaid Other

## 2017-07-31 DIAGNOSIS — I5033 Acute on chronic diastolic (congestive) heart failure: Secondary | ICD-10-CM

## 2017-07-31 DIAGNOSIS — I340 Nonrheumatic mitral (valve) insufficiency: Secondary | ICD-10-CM

## 2017-07-31 DIAGNOSIS — Z978 Presence of other specified devices: Secondary | ICD-10-CM

## 2017-07-31 DIAGNOSIS — A4902 Methicillin resistant Staphylococcus aureus infection, unspecified site: Secondary | ICD-10-CM

## 2017-07-31 DIAGNOSIS — B958 Unspecified staphylococcus as the cause of diseases classified elsewhere: Secondary | ICD-10-CM

## 2017-07-31 LAB — CBC WITH DIFFERENTIAL/PLATELET
BASOS ABS: 0.1 10*3/uL (ref 0.0–0.1)
BASOS PCT: 1 %
Eosinophils Absolute: 0.1 10*3/uL (ref 0.0–0.7)
Eosinophils Relative: 1 %
HEMATOCRIT: 32.3 % — AB (ref 39.0–52.0)
HEMOGLOBIN: 10.1 g/dL — AB (ref 13.0–17.0)
LYMPHS PCT: 11 %
Lymphs Abs: 1.2 10*3/uL (ref 0.7–4.0)
MCH: 31.3 pg (ref 26.0–34.0)
MCHC: 31.3 g/dL (ref 30.0–36.0)
MCV: 100 fL (ref 78.0–100.0)
MONO ABS: 0.5 10*3/uL (ref 0.1–1.0)
MONOS PCT: 4 %
NEUTROS ABS: 9.1 10*3/uL — AB (ref 1.7–7.7)
NEUTROS PCT: 83 %
Platelets: 252 10*3/uL (ref 150–400)
RBC: 3.23 MIL/uL — ABNORMAL LOW (ref 4.22–5.81)
RDW: 14.2 % (ref 11.5–15.5)
WBC: 10.9 10*3/uL — ABNORMAL HIGH (ref 4.0–10.5)

## 2017-07-31 LAB — GLUCOSE, CAPILLARY
GLUCOSE-CAPILLARY: 135 mg/dL — AB (ref 65–99)
GLUCOSE-CAPILLARY: 149 mg/dL — AB (ref 65–99)
GLUCOSE-CAPILLARY: 152 mg/dL — AB (ref 65–99)
GLUCOSE-CAPILLARY: 156 mg/dL — AB (ref 65–99)
GLUCOSE-CAPILLARY: 193 mg/dL — AB (ref 65–99)
GLUCOSE-CAPILLARY: 197 mg/dL — AB (ref 65–99)
Glucose-Capillary: 116 mg/dL — ABNORMAL HIGH (ref 65–99)

## 2017-07-31 LAB — PHOSPHORUS: PHOSPHORUS: 4.5 mg/dL (ref 2.5–4.6)

## 2017-07-31 LAB — CULTURE, BLOOD (ROUTINE X 2)
CULTURE: NO GROWTH
SPECIAL REQUESTS: ADEQUATE

## 2017-07-31 LAB — CK TOTAL AND CKMB (NOT AT ARMC)
CK TOTAL: 24 U/L — AB (ref 49–397)
CK, MB: 0.6 ng/mL (ref 0.5–5.0)
RELATIVE INDEX: INVALID (ref 0.0–2.5)

## 2017-07-31 LAB — MAGNESIUM: Magnesium: 2.2 mg/dL (ref 1.7–2.4)

## 2017-07-31 MED ORDER — FENTANYL CITRATE (PF) 100 MCG/2ML IJ SOLN: 12.5000 ug | INTRAMUSCULAR | Status: DC | PRN

## 2017-07-31 MED ORDER — DEXMEDETOMIDINE HCL IN NACL 200 MCG/50ML IV SOLN
0.0000 ug/kg/h | INTRAVENOUS | Status: DC
  Administered 2017-07-31 – 2017-08-01 (×4): 0.4 ug/kg/h via INTRAVENOUS
  Filled 2017-07-31 (×3): qty 50

## 2017-07-31 MED ORDER — LORAZEPAM 2 MG/ML IJ SOLN: 0.5000 mg | INTRAMUSCULAR | Status: DC | PRN

## 2017-07-31 MED ORDER — ORAL CARE MOUTH RINSE
15.0000 mL | Freq: Two times a day (BID) | OROMUCOSAL | Status: DC
  Administered 2017-08-01 (×2): 15 mL via OROMUCOSAL

## 2017-07-31 MED ORDER — FUROSEMIDE 10 MG/ML IJ SOLN
40.0000 mg | Freq: Two times a day (BID) | INTRAMUSCULAR | Status: DC
  Administered 2017-07-31 – 2017-08-01 (×2): 40 mg via INTRAVENOUS
  Filled 2017-07-31 (×2): qty 4

## 2017-07-31 MED ORDER — PANTOPRAZOLE SODIUM 40 MG PO PACK
40.0000 mg | PACK | Freq: Every day | ORAL | Status: DC
  Administered 2017-07-31: 40 mg
  Filled 2017-07-31: qty 20

## 2017-07-31 MED ORDER — MAGNESIUM SULFATE 2 GM/50ML IV SOLN
2.0000 g | Freq: Once | INTRAVENOUS | Status: AC
  Administered 2017-07-31: 2 g via INTRAVENOUS
  Filled 2017-07-31: qty 50

## 2017-07-31 MED ORDER — HYDRALAZINE HCL 20 MG/ML IJ SOLN
10.0000 mg | INTRAMUSCULAR | Status: DC | PRN
  Administered 2017-07-31: 10 mg via INTRAVENOUS
  Administered 2017-07-31 – 2017-08-02 (×3): 20 mg via INTRAVENOUS
  Filled 2017-07-31 (×4): qty 1

## 2017-07-31 MED ORDER — CHLORHEXIDINE GLUCONATE 0.12 % MT SOLN
15.0000 mL | Freq: Two times a day (BID) | OROMUCOSAL | Status: DC
  Administered 2017-07-31 – 2017-08-10 (×19): 15 mL via OROMUCOSAL
  Filled 2017-07-31 (×15): qty 15

## 2017-07-31 MED ORDER — FENTANYL CITRATE (PF) 100 MCG/2ML IJ SOLN: 100.0000 ug | INTRAMUSCULAR | Status: DC | PRN

## 2017-07-31 MED ORDER — SODIUM CHLORIDE 0.9 % IV SOLN
700.0000 mg | INTRAVENOUS | Status: DC
  Administered 2017-07-31: 700 mg via INTRAVENOUS
  Filled 2017-07-31 (×3): qty 14

## 2017-07-31 MED ORDER — INSULIN ASPART 100 UNIT/ML ~~LOC~~ SOLN
8.0000 [IU] | SUBCUTANEOUS | Status: DC
  Administered 2017-07-31 – 2017-08-01 (×5): 8 [IU] via SUBCUTANEOUS

## 2017-07-31 MED ORDER — SODIUM CHLORIDE 0.9 % IV SOLN
600.0000 mg | Freq: Two times a day (BID) | INTRAVENOUS | Status: DC
  Administered 2017-07-31 – 2017-08-10 (×20): 600 mg via INTRAVENOUS
  Filled 2017-07-31 (×25): qty 600

## 2017-07-31 MED ORDER — POTASSIUM CHLORIDE 20 MEQ/15ML (10%) PO SOLN
40.0000 meq | Freq: Once | ORAL | Status: AC
  Administered 2017-07-31: 40 meq
  Filled 2017-07-31: qty 30

## 2017-07-31 NOTE — Progress Notes (Signed)
Pharmacy Antibiotic Note  Reginald [name] is a 56 y.o. male admitted on 07/25/2017 with MRSA bacteremia and endocarditis.  Pharmacy has been consulted for Vancomycin dosing.  Blood cx have remained positive, ID has changed vancomycin to daptomycin per pharmacy  Today, 07/31/2017   BMI = 36  CK = 24  WBC sl elevated  SCr WNL  Plan:  Borderline BMI for using total BW vs. Adjusted BW.  Studies reveal increase probability of CPK elevation using total BW in obese population.  Start Daptomycin 700mg  IV q24h per Adjust BW  Monitor CK levels weekly  Monitor renal fx  Follow BCx for clearance of bacteria   Height: 5\' 9"  (175.3 cm) Weight: 244 lb 0.8 oz (110.7 kg) IBW/kg (Calculated) : 70.7  Temp (24hrs), Avg:98.7 F (37.1 C), Min:98.4 F (36.9 C), Max:99.5 F (37.5 C)  Recent Labs  Lab 07/26/17 0340  07/27/17 0322 07/28/17 0501  07/29/17 0233 07/29/17 0637 07/29/17 1827 07/30/17 0228 07/31/17 0324  WBC 12.5*  --  13.0*  --   --  10.4  --   --  10.7* 10.9*  CREATININE 0.73  --  0.61 0.81  --  0.69  --   --  0.79  --   LATICACIDVEN  --   --   --   --   --   --   --   --  1.0  --   VANCOTROUGH  --    < >  --   --   --  38*  --   --  21*  --   VANCOPEAK  --   --   --   --    < >  --  38 37  --   --    < > = values in this interval not displayed.    Estimated Creatinine Clearance: 124.9 mL/min (by C-G formula based on SCr of 0.79 mg/dL).    Allergies  Allergen Reactions  . Morphine And Related     Antimicrobials this admission: 12/4 [facility] >> 12/10 12/10 daptomycin   Dose adjustments this admission: 12/7 1800 Vancomycin peak level: 42 12/8 0230 Vancomycin trough level: 38  Microbiology results: 11/29[facility] 12[facility] and [facility] BCx: MRSA (S bactrim, vanc, dapto, linezolid, TCN) - per Dr. Feliz BeamSnider's note 12/4 BCx: GPC (BCID = MRSA) 12/5 HIV Ab: negative 12/6 MRSA PCR: positive 12/7 BCx: S. aureus  Thank you for allowing pharmacy  to be a part of this patient's care.  Reginald Alcideustin Donat [name], PharmD, BCPS.   Pager: [phone] 07/31/2017 12:53 PM

## 2017-07-31 NOTE — Progress Notes (Addendum)
[facility] for Infectious Disease    Date of Admission:  07/25/2017   Total days of antibiotics 6        Day 6 vancomycin   ID: Reginald [name] is a 56 y.o. male with MRSA bacteremia, MV endocarditis with CNS septic emboli  Principal Problem:   MRSA bacteremia Active Problems:   Mitral valve vegetation   Acute respiratory failure (HCC)   Alcohol abuse   Thrombocytopenia (HCC)   Abnormal liver enzymes   Acute encephalopathy   Acute diastolic CHF (congestive heart failure) (HCC)   Sepsis ([place])   Endocarditis of mitral valve   Cerebral septic emboli (HCC)    Subjective:  Remains afebrile, intubated  But blood cx - still positive cultures on 12/7 (persistent since outside hosp )  Medications:  . chlorhexidine gluconate (MEDLINE KIT)  15 mL Mouth Rinse BID  . Chlorhexidine Gluconate Cloth  6 each Topical Q0600  . enoxaparin (LOVENOX) injection  60 mg Subcutaneous Q24H  . feeding supplement (PRO-STAT SUGAR FREE 64)  30 mL Per Tube Daily  . feeding supplement (PRO-STAT SUGAR FREE 64)  60 mL Per Tube TID  . feeding supplement (VITAL HIGH PROTEIN)  1,000 mL Per Tube Q24H  . folic acid  1 mg Intravenous Daily  . furosemide  40 mg Intravenous BID  . insulin aspart  0-20 Units Subcutaneous Q4H  . insulin aspart  8 Units Subcutaneous Q4H  . mouth rinse  15 mL Mouth Rinse QID  . mupirocin ointment  1 application Nasal BID  . pantoprazole sodium  40 mg Per Tube Daily  . thiamine  100 mg Intravenous Daily   Or  . thiamine  100 mg Oral Daily    Objective: Vital signs in last 24 hours: Temp:  [98.4 F (36.9 C)-99.5 F (37.5 C)] 98.6 F (37 C) (12/10 0829) Pulse Rate:  [72-104] 95 (12/10 1123) Resp:  [13-34] 13 (12/10 1123) BP: (118-178)/(59-97) 178/88 (12/10 1123) SpO2:  [90 %-100 %] 100 % (12/10 1123) FiO2 (%):  [40 %-75 %] 40 % (12/10 1123) Weight:  [244 lb 0.8 oz (110.7 kg)] 244 lb 0.8 oz (110.7 kg) (12/10 0416) Physical Exam  Constitutional: He is oriented to  person, eyes open, alert. He appears well-developed and well-nourished. No distress.  HENT: OETT in place, no jaundice Mouth/Throat:  Cardiovascular: Normal rate, regular rhythm and normal heart sounds. 2/6 SEM Pulmonary/Chest: Effort normal and breath sounds normal. No respiratory distress. He has no wheezes.  Abdominal: Soft. Bowel sounds are normal. He exhibits no distension. There is no tenderness.  Neurological: He is alert and oriented to person, place, and time.  Skin: Skin shows some septic emboli ? To palms of left hand and right 5th digit, unchanged. No new ones noted   Lab Results Recent Labs    07/29/17 0233 07/30/17 0228 07/31/17 0324  WBC 10.4 10.7* 10.9*  HGB 10.6* 10.2* 10.1*  HCT 32.6* 32.1* 32.3*  NA 140 143  --   K 4.1 4.1  --   CL 104 106  --   CO2 31 31  --   BUN 39* 42*  --   CREATININE 0.69 0.79  --    Liver Panel Recent Labs    07/30/17 0228  PROT 7.9  ALBUMIN 2.1*  AST 33  ALT 38  ALKPHOS 207*  BILITOT 1.4*  BILIDIR 0.6*  IBILI 0.8    Microbiology: 12/10 - blood cx - 12/7- blood cx - MRSA 12/4 - blood cx  MRSA  Studies/Results: Dg Chest 1 View  Result Date: 07/30/2017 CLINICAL DATA:  Hypoxia EXAM: CHEST 1 VIEW COMPARISON:  Study obtained earlier in the day FINDINGS: Endotracheal tube tip is 5.1 cm above the carina. Nasogastric tube tip and side port are below the diaphragm. No pneumothorax. There is airspace consolidation in the left lower lobe. Lungs elsewhere are clear. Heart is upper normal in size with pulmonary vascularity within normal limits. No adenopathy. No evident bone lesions. IMPRESSION: Airspace consolidation consistent with pneumonia left base. Slightly more opacity noted in this area compared to earlier in the day. Stable cardiac silhouette. Tube positions as described without pneumothorax. Electronically Signed   By: Lowella Grip III M.D.   On: 07/30/2017 14:56   Dg Chest Port 1 View  Result Date: 07/31/2017 CLINICAL  DATA:  56 year old male with recent MRA assay bacteremia. Intubated for respiratory insufficiency. EXAM: PORTABLE CHEST 1 VIEW COMPARISON:  07/30/2017 and earlier. FINDINGS: Portable AP semi upright view at 0437 hours. Stable endotracheal tube tip between the level the clavicles and carina. Enteric tube in place with tip not clearly identified. Stable lung volumes. Stable cardiac size and mediastinal contours. Patchy in veiling bibasilar opacity, greater on the left, with ventilation not significantly changed since 07/27/2017. No pneumothorax or pulmonary edema. IMPRESSION: 1.  Stable lines and tubes. 2. Stable ventilation since 07/27/2017. Bibasilar patchy opacity, more confluent on the left. Consider atelectasis and/or pneumonia. Electronically Signed   By: Genevie Ann M.D.   On: 07/31/2017 07:15   Dg Chest Port 1 View  Result Date: 07/30/2017 CLINICAL DATA:  Acute respiratory distress.  Endotracheal placement. EXAM: PORTABLE CHEST 1 VIEW COMPARISON:  07/28/2017 FINDINGS: Endotracheal tube tip is 4 cm above the carina. Nasogastric tube enters the abdomen. There is worsening density at both lung bases probably due to a combination of pleural fluid and lower lobe atelectasis and or infiltrate. Upper lungs remain clear. IMPRESSION: Worsening density in the lower chest consistent with lower lobe volume loss or infiltrate probably associated with effusions. Electronically Signed   By: Nelson Chimes M.D.   On: 07/30/2017 06:45    Assessment/Plan: Disseminated MRSA infection = has continued MRSA bacteremia despite being therapeutic on vanco. Has had to have decrease in dose due to supratherapeutics levels. Likely due to burden of disease. Still have yet to image spine to see if other satellite infection.   - will change abtx to daptomycin '8mg'$ /kg plus ceftaroline (to cover pneumonia) - recommend imaging chest/abd - looking for other nidus of infection - will check baseline ck - will d/c vancomycin for  now  [facility] for Infectious Diseases Cell: [phone] Pager: [phone]  07/31/2017, 12:40 PM

## 2017-07-31 NOTE — Progress Notes (Signed)
Progress Note  Patient Name: Reginald [name] Date of Encounter: 07/31/2017  Primary Cardiologist: No primary care provider on file.   Subjective   Intubated, sedated. Weaning mechanics are good, Fio2 0.4, but becomes very agitated when weaning.  Inpatient Medications    Scheduled Meds: . chlorhexidine gluconate (MEDLINE KIT)  15 mL Mouth Rinse BID  . Chlorhexidine Gluconate Cloth  6 each Topical Q0600  . enoxaparin (LOVENOX) injection  60 mg Subcutaneous Q24H  . feeding supplement (PRO-STAT SUGAR FREE 64)  30 mL Per Tube Daily  . feeding supplement (PRO-STAT SUGAR FREE 64)  60 mL Per Tube TID  . feeding supplement (VITAL HIGH PROTEIN)  1,000 mL Per Tube Q24H  . fentaNYL (SUBLIMAZE) injection  50 mcg Intravenous Once  . folic acid  1 mg Intravenous Daily  . furosemide  40 mg Intravenous Daily  . insulin aspart  0-20 Units Subcutaneous Q4H  . insulin aspart  6 Units Subcutaneous Q4H  . mouth rinse  15 mL Mouth Rinse QID  . mupirocin ointment  1 application Nasal BID  . pantoprazole (PROTONIX) IV  40 mg Intravenous QHS  . thiamine  100 mg Intravenous Daily   Or  . thiamine  100 mg Oral Daily   Continuous Infusions: . sodium chloride 20 mL/hr at 07/31/17 0400  . fentaNYL infusion INTRAVENOUS 100 mcg/hr (07/31/17 7782)  . propofol (DIPRIVAN) infusion 50 mcg/kg/min (07/31/17 0520)  . vancomycin Stopped (07/30/17 1358)   PRN Meds: fentaNYL, fentaNYL (SUBLIMAZE) injection, hydrALAZINE, ipratropium-albuterol, [DISCONTINUED] ondansetron **OR** ondansetron (ZOFRAN) IV   Vital Signs    Vitals:   07/31/17 0416 07/31/17 0500 07/31/17 0600 07/31/17 0700  BP:  122/70 118/70 121/62  Pulse:  75 76 74  Resp:  18 16 (!) 27  Temp: 98.4 F (36.9 C)     TempSrc: Axillary     SpO2:  100% 100% 100%  Weight: 244 lb 0.8 oz (110.7 kg)     Height:        Intake/Output Summary (Last 24 hours) at 07/31/2017 0823 Last data filed at 07/31/2017 0700 Gross per 24 hour  Intake 2275.29 ml   Output 800 ml  Net 1475.29 ml   Filed Weights   07/29/17 0400 07/30/17 0527 07/31/17 0416  Weight: 252 lb 10.4 oz (114.6 kg) 251 lb 5.2 oz (114 kg) 244 lb 0.8 oz (110.7 kg)    Telemetry    NSr - Personally Reviewed  ECG    No new tracing - Personally Reviewed  Physical Exam  Sedated. Obese GEN: No acute distress.   Neck: No JVD Cardiac: RRR, 2/6 holosystolic murmur at apex, no diastolic murmurs, rubs, or gallops.  Respiratory: Clear to auscultation bilaterally. GI: Soft, nontender, non-distended  MS: No edema; No deformity. Neuro:  Nonfocal  Psych: Normal affect   Labs    Chemistry Recent Labs  Lab 07/27/17 0322 07/28/17 0501 07/29/17 0233 07/30/17 0228  NA 134* 137 140 143  K 4.2 4.0 4.1 4.1  CL 98* 101 104 106  CO2 '30 30 31 31  '$ GLUCOSE 200* 207* 202* 190*  BUN 21* 38* 39* 42*  CREATININE 0.61 0.81 0.69 0.79  CALCIUM 8.4* 8.4* 8.7* 9.0  PROT 6.7 6.8  --  7.9  ALBUMIN 2.0* 1.9*  --  2.1*  AST 40 30  --  33  ALT 50 39  --  38  ALKPHOS 217* 187*  --  207*  BILITOT 2.0* 1.7*  --  1.4*  GFRNONAA >60 >60 >60 >60  GFRAA >60 >60 >60 >60  ANIONGAP '6 6 5 6     '$ Hematology Recent Labs  Lab 07/29/17 0233 07/30/17 0228 07/31/17 0324  WBC 10.4 10.7* 10.9*  RBC 3.35* 3.26* 3.23*  HGB 10.6* 10.2* 10.1*  HCT 32.6* 32.1* 32.3*  MCV 97.3 98.5 100.0  MCH 31.6 31.3 31.3  MCHC 32.5 31.8 31.3  RDW 14.1 14.2 14.2  PLT 184 222 252    Cardiac EnzymesNo results for input(s): TROPONINI in the last 168 hours. No results for input(s): TROPIPOC in the last 168 hours.   BNP Recent Labs  Lab 07/26/17 0340  BNP 104.6*     DDimer No results for input(s): DDIMER in the last 168 hours.   Radiology    Dg Chest 1 View  Result Date: 07/30/2017 CLINICAL DATA:  Hypoxia EXAM: CHEST 1 VIEW COMPARISON:  Study obtained earlier in the day FINDINGS: Endotracheal tube tip is 5.1 cm above the carina. Nasogastric tube tip and side port are below the diaphragm. No pneumothorax.  There is airspace consolidation in the left lower lobe. Lungs elsewhere are clear. Heart is upper normal in size with pulmonary vascularity within normal limits. No adenopathy. No evident bone lesions. IMPRESSION: Airspace consolidation consistent with pneumonia left base. Slightly more opacity noted in this area compared to earlier in the day. Stable cardiac silhouette. Tube positions as described without pneumothorax. Electronically Signed   By: Lowella Grip III M.D.   On: 07/30/2017 14:56   Dg Chest Port 1 View  Result Date: 07/31/2017 CLINICAL DATA:  56 year old male with recent MRA assay bacteremia. Intubated for respiratory insufficiency. EXAM: PORTABLE CHEST 1 VIEW COMPARISON:  07/30/2017 and earlier. FINDINGS: Portable AP semi upright view at 0437 hours. Stable endotracheal tube tip between the level the clavicles and carina. Enteric tube in place with tip not clearly identified. Stable lung volumes. Stable cardiac size and mediastinal contours. Patchy in veiling bibasilar opacity, greater on the left, with ventilation not significantly changed since 07/27/2017. No pneumothorax or pulmonary edema. IMPRESSION: 1.  Stable lines and tubes. 2. Stable ventilation since 07/27/2017. Bibasilar patchy opacity, more confluent on the left. Consider atelectasis and/or pneumonia. Electronically Signed   By: Genevie Ann M.D.   On: 07/31/2017 07:15   Dg Chest Port 1 View  Result Date: 07/30/2017 CLINICAL DATA:  Acute respiratory distress.  Endotracheal placement. EXAM: PORTABLE CHEST 1 VIEW COMPARISON:  07/28/2017 FINDINGS: Endotracheal tube tip is 4 cm above the carina. Nasogastric tube enters the abdomen. There is worsening density at both lung bases probably due to a combination of pleural fluid and lower lobe atelectasis and or infiltrate. Upper lungs remain clear. IMPRESSION: Worsening density in the lower chest consistent with lower lobe volume loss or infiltrate probably associated with effusions.  Electronically Signed   By: Nelson Chimes M.D.   On: 07/30/2017 06:45    Cardiac Studies   07/27/2017 TEE  - Left ventricle: Normal LV size. Moderate LVH. Systolic function   was normal. The estimated ejection fraction was in the range of   60% to 65%. Wall motion was normal; there were no regional wall   motion abnormalities. - Aortic valve: Trileaflet. Small echogenic mass at the tip of the   non-coronary cusp. lambl&'s excrescences. Trivial AI. - Aorta: Grade 3 mobile atheroma of the proximal aortic arch. - Mitral valve: Large 2-3 cm mass of the A3 segment of the anterior   leaflet, prolapsing across the posterior leaflet. There is a   distinct posterior jet which  is centrally directed of severe   regurgitation, suggestive of perforation. There is dropout seen   through the vegetation or in the area of the P3 scallop - not   entirely clear, but concerning for possible perforation. There is   vegetation to a lesser extent on the posterior leaflet tip.   Effective regurgitant orifice (PISA): 1.01 cm^2. Regurgitant   volume (PISA): 155 ml. - Left atrium: The atrium was dilated. No evidence of thrombus in   the atrial cavity or appendage. - Right atrium: No evidence of thrombus in the atrial cavity or   appendage. - Atrial septum: No defect or patent foramen ovale was identified. - Pulmonic valve: No evidence of vegetation.  Impressions:  - Large mass of the A3 segment of the anterior mitral leaflet,   prolapsing over the posterior leaflet. Posteriorly located   central regurgitant jet suggestive of possible perforation - in   the region of the P3 segment. Endocarditis of the posterior   leaflet tip is noted. There is a possible small mass on the   non-coronary cusp of the aortic valve with trivial AI - this   could be resolved endocarditis or sclerosis as well.  Patient Profile     56 y.o. male with a hx of diastolic CHF, AAA, COPD, DM type 2, tobacco use and alcohol  usenow with admit for HF, severe MR due to MRSA endocarditis, complicated by stroke.  Assessment & Plan    1. WYS:[id number] on vent, but may require additional diuresis when off positive pressure ventilation. Renal function stable. Yesterday net positive fluids. Increase furosemide back to 40 mg BID. 2. Severe MR: will eventually need MV replacement.BCx still positive 12/7, premature to involve CT surgery until BCx are sterile. 3. 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 High risk for more strokes. Would proceed with surgery sooner rather than later, but need sterile BCx first. After extubation, plan transfer to [facility] for cardiac cath (hopefully before the end of the week) and probably keep him there for surgical evaluation and surgery next week.  For questions or updates, please contact [place] Please consult [email or web address] for contact info under Cardiology/STEMI.      Signed, Sanda Klein, MD  07/31/2017, 8:23 AM

## 2017-07-31 NOTE — Procedures (Signed)
Extubation Procedure Note  Patient Details:   Name: Reginald [name] DOB: 04/12/1961 MRN: [id number]   Airway Documentation:  Airway 7.5 mm (Active)  Secured at (cm) 25 cm 07/31/2017 11:23 AM  Measured From Lips 07/31/2017 11:23 AM  Secured Location Left 07/31/2017 11:23 AM  Secured By [employer] Tube Holder 07/31/2017 11:23 AM  Tube Holder Repositioned Yes 07/31/2017 11:23 AM  Cuff Pressure (cm H2O) 29 cm H2O 07/31/2017  8:29 AM  Site Condition Dry 07/31/2017 11:23 AM    Evaluation  O2 sats: stable throughout Complications: No apparent complications Patient did tolerate procedure well. Bilateral Breath Sounds: Diminished   Yes  Katheren Shamsowell, Travia Onstad Farmer 07/31/2017, 2:28 PM

## 2017-07-31 NOTE — Progress Notes (Signed)
[facility]  PM rounds: He is feeling well, has been on vent weaning all day On exam he is comfortable, following commands Has minimal secretions  Will extubate, d/c precedex, monitor in ICU setting Use low dose fentanyl and ativan for pain and anxiety respectively  Heber CarolinaBrent Lorn Butcher, MD [place] [facility] Pager: [phone] Cell: [phone] After 3pm or if no response, call [phone]

## 2017-07-31 NOTE — Progress Notes (Signed)
PULMONARY / CRITICAL CARE MEDICINE   Name: Reginald [name] MRN: [id number] DOB: 09/29/1960    ADMISSION DATE:  07/25/2017 CONSULTATION DATE:  12/6  REFERRING MD:  Dr [facility]  CHIEF COMPLAINT:  agitation  brief [age] year old male with past medical history as below, which is significant for COPD, diabetes mellitus, alcohol abuse, diastolic CHF, and AAA who was initially admitted to [facility] on 11/29 with nonspecific complaints of generalized weakness, low-grade fever, and muscle aches.  Blood cultures done at admission grew out MRSA and he was started on vancomycin.  2D echocardiogram at that time demonstrated a mitral valve vegetation.  He did require ICU transfer during [facility] for Precedex infusion with presumed alcohol withdrawal was weaned off on 12/3.  The [facility] for infectious diseases consultation.  On 12/4 he was found to have a new facial droop.  MRI was done and demonstrated multiple infarcts to both cerebral hemispheres consistent with septic emboli.  12/5 late p.m. he developed agitation which is refractory to repeated doses of Ativan.  He was started on Precedex infusion.  PCCM asked to see in consultation.   STUDIES:  TEE 12/6: - Large mass of the A3 segment of the anterior mitral leaflet,prolapsing over the posterior leaflet. Posteriorly located central regurgitant jet suggestive of possible perforation - in   the region of the P3 segment. Endocarditis of the posterior leaflet tip is noted. There is a possible small mass on the non-coronary cusp of the aortic valve with trivial AI - this could be resolved endocarditis or sclerosis as well. [facility] BCID: staph>aureus > methicillin resistance.  BCx2 12/4 >>>MRSA BCX2 12/7>>>MRSA BCX2 12/10>>>  ANTIBIOTICS: Vancomycin 12/4 >  SIGNIFICANT EVENTS: 11/29- blood cx x 2 mrsa  12/3 blood cx 2 MRSA = bactrim S, vanco S, dapto S, linezolid, S, tetra  12/1  blood cx x 2  mrsa 07/25/2017 - admit [facility] from [place] 12/5  -MRI brain > multiple small areas of restricted diffusion in the brain bilaterally most compatible with acute infarct from emboli. 12/6>>> ETT  07/29/17 -  - not on pressors. Making urine. 40%fio2 . On diprivan. Got agitated and desaturaetd during  WUA/SBT. Back on vent 12/10: Transitioning to weaning  SUBJECTIVE/OVERNIGHT/INTERVAL HX Sedated on vent  VITAL SIGNS: BP 121/62   Pulse 74   Temp 98.4 F (36.9 C) (Axillary)   Resp (!) 27   Ht 5\' 9"  (1.753 m)   Wt 244 lb 0.8 oz (110.7 kg)   SpO2 100%   BMI 36.04 kg/m   HEMODYNAMICS:    VENTILATOR SETTINGS: Vent Mode: PRVC FiO2 (%):  [40 %-75 %] 40 % Set Rate:  [16 bmp] 16 bmp Vt Set:  [560 mL] 560 mL PEEP:  [5 cmH20-8 cmH20] 8 cmH20 Pressure Support:  [10 cmH20] 10 cmH20 Plateau Pressure:  [20 cmH20-33 cmH20] 20 cmH20  INTAKE / OUTPUT:  Intake/Output Summary (Last 24 hours) at 07/31/2017 0841 Last data filed at 07/31/2017 0700 Gross per 24 hour  Intake 2275.29 ml  Output 800 ml  Net 1475.29 ml     PHYSICAL EXAMINATION: General: [age] year old white male currently sedated heavily on the ventilator HEENT: Normocephalic atraumatic orally intubated Pulmonary: Basilar crackles, no accessory muscle use equal chest rise on ventilator. Cardiac: Regular rate and rhythm + murmur Abdomen: Soft nontender positive bowel sounds no organomegaly. Extremities/musculoskeletal: Warm dry intact skin.  Brisk cap refill, strong pulses. Neuro/psych: Sedated heavily on ventilator this a.m.   PULMONARY Recent Labs  Lab 07/27/17 0000 07/27/17 0730 07/27/17 1120 07/28/17 0330  PHART 7.505* 7.363 7.397 7.415  PCO2ART 40.2 58.3* 53.5* 49.2*  PO2ART 64.3* 146* 332* 112*  HCO3 31.4* 32.3* 32.2* 30.9*  O2SAT 92.3 98.6 99.6 98.0    CBC Recent Labs  Lab 07/29/17 0233 07/30/17 0228 07/31/17 0324  HGB 10.6* 10.2* 10.1*  HCT 32.6* 32.1* 32.3*  WBC 10.4 10.7* 10.9*  PLT 184 222 252     COAGULATION Recent Labs  Lab 07/30/17 0228  INR 1.14    CARDIAC  No results for input(s): TROPONINI in the last 168 hours. No results for input(s): PROBNP in the last 168 hours.   CHEMISTRY Recent Labs  Lab 07/26/17 0340 07/27/17 0322  07/27/17 1701 07/28/17 0501 07/28/17 1728 07/29/17 0233 07/30/17 0228 07/31/17 0324  NA 132* 134*  --   --  137  --  140 143  --   K 4.1 4.2  --   --  4.0  --  4.1 4.1  --   CL 94* 98*  --   --  101  --  104 106  --   CO2 30 30  --   --  30  --  31 31  --   GLUCOSE 221* 200*  --   --  207*  --  202* 190*  --   BUN 28* 21*  --   --  38*  --  39* 42*  --   CREATININE 0.73 0.61  --   --  0.81  --  0.69 0.79  --   CALCIUM 8.6* 8.4*  --   --  8.4*  --  8.7* 9.0  --   MG  --   --    < > 1.9 2.1 2.0  --  1.9 2.2  PHOS  --   --    < > 5.3* 4.8* 4.6  --  4.2 4.5   < > = values in this interval not displayed.   Estimated Creatinine Clearance: 124.9 mL/min (by C-G formula based on SCr of 0.79 mg/dL).   LIVER Recent Labs  Lab 07/26/17 0340 07/27/17 0322 07/28/17 0501 07/30/17 0228  AST 46* 40 30 33  ALT 58 50 39 38  ALKPHOS 245* 217* 187* 207*  BILITOT 1.7* 2.0* 1.7* 1.4*  PROT 7.1 6.7 6.8 7.9  ALBUMIN 2.2* 2.0* 1.9* 2.1*  INR  --   --   --  1.14     INFECTIOUS Recent Labs  Lab 07/30/17 0228  LATICACIDVEN 1.0     ENDOCRINE CBG (last 3)  Recent Labs    07/30/17 2037 07/31/17 0026 07/31/17 0405  GLUCAP 153* 152* 193*         IMAGING x48h  - image(s) personally visualized  -   highlighted in bold Dg Chest 1 View  Result Date: 07/30/2017 CLINICAL DATA:  Hypoxia EXAM: CHEST 1 VIEW COMPARISON:  Study obtained earlier in the day FINDINGS: Endotracheal tube tip is 5.1 cm above the carina. Nasogastric tube tip and side port are below the diaphragm. No pneumothorax. There is airspace consolidation in the left lower lobe. Lungs elsewhere are clear. Heart is upper normal in size with pulmonary vascularity within normal  limits. No adenopathy. No evident bone lesions. IMPRESSION: Airspace consolidation consistent with pneumonia left base. Slightly more opacity noted in this area compared to earlier in the day. Stable cardiac silhouette. Tube positions as described without pneumothorax. Electronically Signed   By: Bretta Bang III M.D.   On:  07/30/2017 14:56   Dg Chest Port 1 View  Result Date: 07/31/2017 CLINICAL DATA:  56 year old male with recent MRA assay bacteremia. Intubated for respiratory insufficiency. EXAM: PORTABLE CHEST 1 VIEW COMPARISON:  07/30/2017 and earlier. FINDINGS: Portable AP semi upright view at 0437 hours. Stable endotracheal tube tip between the level the clavicles and carina. Enteric tube in place with tip not clearly identified. Stable lung volumes. Stable cardiac size and mediastinal contours. Patchy in veiling bibasilar opacity, greater on the left, with ventilation not significantly changed since 07/27/2017. No pneumothorax or pulmonary edema. IMPRESSION: 1.  Stable lines and tubes. 2. Stable ventilation since 07/27/2017. Bibasilar patchy opacity, more confluent on the left. Consider atelectasis and/or pneumonia. Electronically Signed   By: Odessa Fleming M.D.   On: 07/31/2017 07:15   Dg Chest Port 1 View  Result Date: 07/30/2017 CLINICAL DATA:  Acute respiratory distress.  Endotracheal placement. EXAM: PORTABLE CHEST 1 VIEW COMPARISON:  07/28/2017 FINDINGS: Endotracheal tube tip is 4 cm above the carina. Nasogastric tube enters the abdomen. There is worsening density at both lung bases probably due to a combination of pleural fluid and lower lobe atelectasis and or infiltrate. Upper lungs remain clear. IMPRESSION: Worsening density in the lower chest consistent with lower lobe volume loss or infiltrate probably associated with effusions. Electronically Signed   By: Paulina Fusi M.D.   On: 07/30/2017 06:45      DISCUSSION: 56 year old male with MRSA bacteremia with mitral valve vegetation.  Being treated with vancomycin and followed by ID. 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 Now intubated.  Working on diuresis, supported on the vent.  Remains on IV antibiotics however most recent blood cultures still have not cleared. ASSESSMENT / PLAN:  MRSA bacteremia and mitral valve endocarditis -No history of IV drug abuse.  Source of nidus of infection not entirely clear but thoracic surgery also raises concern for necrotic/loose teeth -Hemodynamically stable -Still not clearing blood cultures Would be surgical candidate if: a) pulm status improved and can be extubated b) has dental consult and likely extractions c) has left and right heart cath d) has no worsening of LFTs and finally would add e) improvement in mental status.   Plan  Continuing IV vancomycin per infectious disease Further recs per ID; ? 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  This continues to demonstrate bilateral airspace disease there is no significant change when compared to prior day support tubes and lines in satisfactory position. -Is -6 L since admit Plan Daily assessment for weaning PAD protocol RASS goal 0--1 Continuing Lasix for negative fluid balance goal VAP prevention   Hypertension Plan Continue Lasix and as needed hydralazine  Abnormal LFTs,  improved Plan Continue to trend  DM with hyperglycemia Plan Continue sliding scale as well as basal dosing of insulin  AAA Plan Follow-up CT scan in 1 year  Anemia of critical illness Plan Continuing low molecular weight heparin Trend CBC Transfuse per protocol  DVT prophylaxis: LMWH SUP: PPI  Diet: tubefeeds Activity: BR Disposition : ICU   07/31/2017 8:41 AM

## 2017-07-31 NOTE — Care Management Note (Signed)
Case Management Note  Patient Details  Name: Reginald [name] MRN: [id number] Date of Birth: 02/14/1961  Subjective/Objective:                  Remains on full vent support  Action/Plan: Will follow for cm needs Expected Discharge Date:                  Expected Discharge Plan:     In-House Referral:     Discharge planning Services     Post Acute Care Choice:    Choice offered to:     DME Arranged:    DME Agency:     HH Arranged:    HH Agency:     Status of Service:     If discussed at [employer] of [employer] Meetings, dates discussed:    Additional Comments:  Golda AcreDavis, Kiira Brach Lynn, RN 07/31/2017, 8:42 AM

## 2017-07-31 NOTE — Progress Notes (Signed)
Dr. Kendrick FriesMcquaid wrote orders to extubate. RT extubated the Pt to 4l [place]. Pt able to speak. RT suctioned and leak test was positive. Pt's SAT 96%. Pt is stable

## 2017-08-01 ENCOUNTER — Inpatient Hospital Stay (HOSPITAL_COMMUNITY): Payer: Medicaid Other

## 2017-08-01 DIAGNOSIS — I130 Hypertensive heart and chronic kidney disease with heart failure and stage 1 through stage 4 chronic kidney disease, or unspecified chronic kidney disease: Secondary | ICD-10-CM

## 2017-08-01 LAB — CBC WITH DIFFERENTIAL/PLATELET
Basophils Absolute: 0.1 10*3/uL (ref 0.0–0.1)
Basophils Relative: 1 %
EOS ABS: 0 10*3/uL (ref 0.0–0.7)
Eosinophils Relative: 0 %
HCT: 35.5 % — ABNORMAL LOW (ref 39.0–52.0)
HEMOGLOBIN: 11.2 g/dL — AB (ref 13.0–17.0)
LYMPHS ABS: 0.9 10*3/uL (ref 0.7–4.0)
Lymphocytes Relative: 9 %
MCH: 31.4 pg (ref 26.0–34.0)
MCHC: 31.5 g/dL (ref 30.0–36.0)
MCV: 99.4 fL (ref 78.0–100.0)
Monocytes Absolute: 0.5 10*3/uL (ref 0.1–1.0)
Monocytes Relative: 5 %
NEUTROS PCT: 85 %
Neutro Abs: 8.7 10*3/uL — ABNORMAL HIGH (ref 1.7–7.7)
Platelets: ADEQUATE 10*3/uL (ref 150–400)
RBC: 3.57 MIL/uL — ABNORMAL LOW (ref 4.22–5.81)
RDW: 14.2 % (ref 11.5–15.5)
WBC: 10.2 10*3/uL (ref 4.0–10.5)

## 2017-08-01 LAB — GLUCOSE, CAPILLARY
GLUCOSE-CAPILLARY: 115 mg/dL — AB (ref 65–99)
GLUCOSE-CAPILLARY: 186 mg/dL — AB (ref 65–99)
GLUCOSE-CAPILLARY: 220 mg/dL — AB (ref 65–99)
Glucose-Capillary: 134 mg/dL — ABNORMAL HIGH (ref 65–99)
Glucose-Capillary: 168 mg/dL — ABNORMAL HIGH (ref 65–99)
Glucose-Capillary: 210 mg/dL — ABNORMAL HIGH (ref 65–99)

## 2017-08-01 LAB — MAGNESIUM: MAGNESIUM: 2.1 mg/dL (ref 1.7–2.4)

## 2017-08-01 LAB — PHOSPHORUS: PHOSPHORUS: 3.5 mg/dL (ref 2.5–4.6)

## 2017-08-01 MED ORDER — FUROSEMIDE 10 MG/ML IJ SOLN: 40.0000 mg | Freq: Every day | INTRAMUSCULAR | Status: DC

## 2017-08-01 MED ORDER — ACETAMINOPHEN 160 MG/5ML PO SOLN
650.0000 mg | Freq: Four times a day (QID) | ORAL | Status: DC | PRN
  Filled 2017-08-01 (×2): qty 20.3

## 2017-08-01 MED ORDER — ENOXAPARIN SODIUM 60 MG/0.6ML ~~LOC~~ SOLN
50.0000 mg | SUBCUTANEOUS | Status: DC
  Administered 2017-08-01: 12:00:00 50 mg via SUBCUTANEOUS
  Filled 2017-08-01 (×2): qty 0.5

## 2017-08-01 MED ORDER — SODIUM CHLORIDE 0.9% FLUSH
3.0000 mL | Freq: Two times a day (BID) | INTRAVENOUS | Status: DC
  Administered 2017-08-01 – 2017-08-02 (×2): 3 mL via INTRAVENOUS

## 2017-08-01 MED ORDER — ASPIRIN 81 MG PO CHEW
81.0000 mg | CHEWABLE_TABLET | ORAL | Status: AC
  Administered 2017-08-02: 81 mg via ORAL
  Filled 2017-08-01: qty 1

## 2017-08-01 MED ORDER — INSULIN ASPART 100 UNIT/ML ~~LOC~~ SOLN
0.0000 [IU] | Freq: Three times a day (TID) | SUBCUTANEOUS | Status: DC
  Administered 2017-08-01: 4 [IU] via SUBCUTANEOUS
  Administered 2017-08-01: 7 [IU] via SUBCUTANEOUS
  Administered 2017-08-01: 4 [IU] via SUBCUTANEOUS
  Administered 2017-08-02: 7 [IU] via SUBCUTANEOUS
  Administered 2017-08-02: 3 [IU] via SUBCUTANEOUS
  Administered 2017-08-02 – 2017-08-03 (×3): 4 [IU] via SUBCUTANEOUS
  Administered 2017-08-03: 3 [IU] via SUBCUTANEOUS
  Administered 2017-08-03: 4 [IU] via SUBCUTANEOUS
  Administered 2017-08-03: 7 [IU] via SUBCUTANEOUS
  Administered 2017-08-04 (×2): 3 [IU] via SUBCUTANEOUS
  Administered 2017-08-04 (×2): 4 [IU] via SUBCUTANEOUS
  Administered 2017-08-05 (×2): 3 [IU] via SUBCUTANEOUS
  Administered 2017-08-05 (×2): 4 [IU] via SUBCUTANEOUS
  Administered 2017-08-06 (×2): 3 [IU] via SUBCUTANEOUS
  Administered 2017-08-06 – 2017-08-07 (×2): 4 [IU] via SUBCUTANEOUS
  Administered 2017-08-07: 8 [IU] via SUBCUTANEOUS
  Administered 2017-08-07 (×2): 4 [IU] via SUBCUTANEOUS
  Administered 2017-08-08: 7 [IU] via SUBCUTANEOUS
  Administered 2017-08-08: 3 [IU] via SUBCUTANEOUS
  Administered 2017-08-08: 4 [IU] via SUBCUTANEOUS
  Administered 2017-08-08: 11 [IU] via SUBCUTANEOUS
  Administered 2017-08-09: 4 [IU] via SUBCUTANEOUS

## 2017-08-01 MED ORDER — POTASSIUM CHLORIDE 20 MEQ/15ML (10%) PO SOLN
20.0000 meq | Freq: Every day | ORAL | Status: DC
  Administered 2017-08-01 – 2017-08-04 (×4): 20 meq via ORAL
  Filled 2017-08-01 (×4): qty 15

## 2017-08-01 MED ORDER — IOPAMIDOL (ISOVUE-300) INJECTION 61%
INTRAVENOUS | Status: AC
  Filled 2017-08-01: qty 30

## 2017-08-01 MED ORDER — SODIUM CHLORIDE 0.9 % IV SOLN: 250.0000 mL | INTRAVENOUS | Status: DC | PRN

## 2017-08-01 MED ORDER — SODIUM CHLORIDE 0.9 % IV SOLN
INTRAVENOUS | Status: DC
  Administered 2017-08-02: 09:00:00 via INTRAVENOUS

## 2017-08-01 MED ORDER — METOPROLOL TARTRATE 5 MG/5ML IV SOLN
5.0000 mg | Freq: Four times a day (QID) | INTRAVENOUS | Status: DC
  Administered 2017-08-01 – 2017-08-02 (×3): 5 mg via INTRAVENOUS
  Filled 2017-08-01 (×4): qty 5

## 2017-08-01 MED ORDER — IOPAMIDOL (ISOVUE-300) INJECTION 61%
INTRAVENOUS | Status: AC
  Administered 2017-08-01: 100 mL
  Filled 2017-08-01: qty 100

## 2017-08-01 MED ORDER — SODIUM CHLORIDE 0.9% FLUSH: 3.0000 mL | INTRAVENOUS | Status: DC | PRN

## 2017-08-01 NOTE — Evaluation (Signed)
Clinical/Bedside Swallow Evaluation Patient Details  Name: Reginald [name] MRN: [id number] Date of Birth: 04/24/1961  Today's Date: 08/01/2017 Time: SLP Start Time (ACUTE ONLY): 1039 SLP Stop Time (ACUTE ONLY): 1055 SLP Time Calculation (min) (ACUTE ONLY): 16 min  Past Medical History:  Past Medical History:  Diagnosis Date  . Alcohol abuse   . Diabetes mellitus type 2 in obese (HCC)   . Neck pain   . Tobacco abuse    Past Surgical History:  Past Surgical History:  Procedure Laterality Date  . HERNIA REPAIR  2007  . Tonsillectomy /adnoidectomy     as aa child   HPI:  56 yo male [facility] outside hospital with AMS, MRSA bacteremia - pt with left facial droop per notes.  Pending head MRI.  PMH + for ETOH,  CHF, COPD, tobacco use.  Pt CXR 12/4 can not rule out pna vs small pleural effusion. Pt required intubation due to AMS, hypoxia intubated 12/6-12/10.  Swallow evaluation ordered.     Assessment / Plan / Recommendation Clinical Impression  Patient today with much improved mental status and voice compared to prior intubation.  Pt does continue with left facial droop - but bilateral palatal elevation.  No indication of airway compromise with po intake.  Swallow appeared timely and laryngeal elevation appreciated via palpation.  Recommend advance diet as tolerated, SLP to follow up x1 given length of intubation.  Educated pt/family to recommendations, clinical indications of swallow difficulties and general precautions.  Thanks. SLP Visit Diagnosis: Dysphagia, unspecified (R13.10)    Aspiration Risk  Mild aspiration risk    Diet Recommendation Regular;Thin liquid   Liquid Administration via: Cup;Straw Medication Administration: Whole meds with liquid Supervision: Full supervision/cueing for compensatory strategies;Comment(family can supervise) Compensations: Slow rate;Small sips/bites;Minimize environmental distractions Postural Changes: Seated upright at 90 degrees;Remain  upright for at least 30 minutes after po intake    Other  Recommendations Oral Care Recommendations: Oral care BID   Follow up Recommendations (tbd)      Frequency and Duration min 1 x/week  1 week       Prognosis Prognosis for Safe Diet Advancement: Good      Swallow Study   General Date of Onset: 07/26/17 HPI: 56 yo male [facility] hospital with AMS, MRSA bacteremia - pt with left facial droop per notes.  Pending head MRI.  PMH + for ETOH,  CHF, COPD, tobacco use.  Pt CXR 12/4 can not rule out pna vs small pleural effusion. Pt required intubation due to AMS, hypoxia intubated 12/6-12/10.  Swallow evaluation ordered.   Type of Study: Bedside Swallow Evaluation Diet Prior to this Study: (carb mod, but pt npo x contrast at this time) Respiratory Status: Nasal cannula History of Recent Intubation: Yes Length of Intubations (days): 5 days Date extubated: 07/31/17 Behavior/Cognition: Lethargic/Drowsy;Uncooperative;Impulsive Oral Care Completed by SLP: No Oral Cavity - Dentition: Poor condition;Adequate natural dentition(one lower tooth) Vision: Functional for self-feeding Self-Feeding Abilities: Needs assist(significant other giving pt po contrast via straw) Patient Positioning: Upright in bed Baseline Vocal Quality: Normal;Other (comment)(? mildly decreased amplitude) Volitional Cough: Strong Volitional Swallow: Able to elicit    Oral/Motor/Sensory Function Facial Symmetry: Abnormal symmetry left   Ice Chips Ice chips: Not tested   Thin Liquid Thin Liquid: Within functional limits Presentation: Straw    Nectar Thick Nectar Thick Liquid: Not tested   Honey Thick Honey Thick Liquid: Not tested   Puree Puree: Not tested   Solid   GO  Solid: Not tested        Chales AbrahamsKimball, Antionette Luster Ann 08/01/2017,11:08 AM Donavan Burnetamara Maranda Marte, MS [facility] SLP [phone]

## 2017-08-01 NOTE — Progress Notes (Signed)
eLink Physician-Brief Progress Note Patient Name: Reginald [name] DOB: 12/15/1960 MRN: [id number]   Date of Service  08/01/2017  HPI/Events of Note  Patient c/o headache. AST and ALT are both normal.   eICU Interventions  Will order: 1. Tylenol liquid 650 mg per tube Q 6 hours PRN headache.      Intervention Category Intermediate Interventions: Pain - evaluation and management  Lizania Bouchard Eugene 08/01/2017, 12:15 AM

## 2017-08-01 NOTE — Progress Notes (Signed)
PULMONARY / CRITICAL CARE MEDICINE   Name: Reginald [name] MRN: [id number] DOB: 08/15/1961    ADMISSION DATE:  07/25/2017 CONSULTATION DATE:  12/6  REFERRING MD:  Dr Lowell [facility]ell [facility]  CHIEF COMPLAINT:  agitation  brief 56 year old male with past medical history as below, which is significant for COPD, diabetes mellitus, alcohol abuse, diastolic CHF, and AAA who was initially admitted to [facility] on 11/29 with nonspecific complaints of generalized weakness, low-grade fever, and muscle aches.  Blood cultures done at admission grew out MRSA and he was started on vancomycin.  2D echocardiogram at that time demonstrated a mitral valve vegetation.  He did require ICU transfer during [facility] for Precedex infusion with presumed alcohol withdrawal was weaned off on 12/3.  The patient [facility] for infectious diseases consultation.  On 12/4 he was found to have a new facial droop.  MRI was done and demonstrated multiple infarcts to both cerebral hemispheres consistent with septic emboli.  12/5 late p.m. he developed agitation which is refractory to repeated doses of Ativan.  He was started on Precedex infusion.  PCCM asked to see in consultation.   STUDIES:  TEE 12/6: - Large mass of the A3 segment of the anterior mitral leaflet,prolapsing over the posterior leaflet. Posteriorly located central regurgitant jet suggestive of possible perforation - in   the region of the P3 segment. Endocarditis of the posterior leaflet tip is noted. There is a possible small mass on the non-coronary cusp of the aortic valve with trivial AI - this could be resolved endocarditis or sclerosis as well. [facility] BCID: staph>aureus > methicillin resistance.  BCx2 12/4 >>>MRSA BCX2 12/7>>>MRSA BCX2 12/10>>> GPC in clusters  ANTIBIOTICS: Vancomycin 12/4 >12/10 dapto 12/10>>> Teflaro 12/10>>  SIGNIFICANT EVENTS: 11/29- blood cx x 2 mrsa  12/3 blood cx 2 MRSA = bactrim S, vanco S,  dapto S, linezolid, S, tetra  12/1  blood cx x 2 mrsa 07/25/2017 - admit [facility] [place] 12/5  -MRI brain > multiple small areas of restricted diffusion in the brain bilaterally most compatible with acute infarct from emboli. 12/6>>> ETT  07/29/17 -  - not on pressors. Making urine. 40%fio2 . On diprivan. Got agitated and desaturaetd during  WUA/SBT. Back on vent 12/10: Transitioning to weaning 12/10 extubated, antibiotics were  Widened  SUBJECTIVE/OVERNIGHT/INTERVAL HX Awake, extubated looks strong wants to eat  VITAL SIGNS: BP (!) 150/66   Pulse 88   Temp 98.6 F (37 C) (Oral)   Resp (!) 32   Ht 5\' 9"  (1.753 m)   Wt 238 lb 12.1 oz (108.3 kg)   SpO2 97%   BMI 35.26 kg/m   HEMODYNAMICS:    VENTILATOR SETTINGS: Vent Mode: PSV;CPAP FiO2 (%):  [40 %] 40 % PEEP:  [5 cmH20] 5 cmH20 Pressure Support:  [10 cmH20] 10 cmH20  INTAKE / OUTPUT:  Intake/Output Summary (Last 24 hours) at 08/01/2017 0851 Last data filed at 08/01/2017 0500 Gross per 24 hour  Intake 1239.72 ml  Output 3050 ml  Net -1810.28 ml     PHYSICAL EXAMINATION: General: 56 year old male patient now awake alert oriented x3. HEENT: Normocephalic atraumatic no jugular venous distention mucous membranes moist poor dental health. Pulmonary: Clear to auscultation decreased bases Cardiac: Holosystolic murmur, regular rhythm Abdomen: Soft nontender positive bowel sounds no pain GU: Clear yellow Extremities/musculoskeletal warm dry brisk cap refill no significant edema Neuro/psych: Awake alert moves all extremities has perhaps a little left-sided weakness oriented x3  PULMONARY Recent Labs  Lab 07/27/17 0000 07/27/17 0730 07/27/17 1120 07/28/17 0330  PHART 7.505* 7.363 7.397 7.415  PCO2ART 40.2 58.3* 53.5* 49.2*  PO2ART 64.3* 146* 332* 112*  HCO3 31.4* 32.3* 32.2* 30.9*  O2SAT 92.3 98.6 99.6 98.0    CBC Recent Labs  Lab 07/30/17 0228 07/31/17 0324 08/01/17 0344  HGB 10.2* 10.1* 11.2*  HCT  32.1* 32.3* 35.5*  WBC 10.7* 10.9* 10.2  PLT 222 252 PLATELET CLUMPS NOTED ON SMEAR, COUNT APPEARS ADEQUATE    COAGULATION Recent Labs  Lab 07/30/17 0228  INR 1.14    CARDIAC  No results for input(s): TROPONINI in the last 168 hours. No results for input(s): PROBNP in the last 168 hours.   CHEMISTRY Recent Labs  Lab 07/26/17 0340 07/27/17 0322  07/28/17 0501 07/28/17 1728 07/29/17 0233 07/30/17 0228 07/31/17 0324 08/01/17 0344  NA 132* 134*  --  137  --  140 143  --   --   K 4.1 4.2  --  4.0  --  4.1 4.1  --   --   CL 94* 98*  --  101  --  104 106  --   --   CO2 30 30  --  30  --  31 31  --   --   GLUCOSE 221* 200*  --  207*  --  202* 190*  --   --   BUN 28* 21*  --  38*  --  39* 42*  --   --   CREATININE 0.73 0.61  --  0.81  --  0.69 0.79  --   --   CALCIUM 8.6* 8.4*  --  8.4*  --  8.7* 9.0  --   --   MG  --   --    < > 2.1 2.0  --  1.9 2.2 2.1  PHOS  --   --    < > 4.8* 4.6  --  4.2 4.5 3.5   < > = values in this interval not displayed.   Estimated Creatinine Clearance: 123.5 mL/min (by C-G formula based on SCr of 0.79 mg/dL).   LIVER Recent Labs  Lab 07/26/17 0340 07/27/17 0322 07/28/17 0501 07/30/17 0228  AST 46* 40 30 33  ALT 58 50 39 38  ALKPHOS 245* 217* 187* 207*  BILITOT 1.7* 2.0* 1.7* 1.4*  PROT 7.1 6.7 6.8 7.9  ALBUMIN 2.2* 2.0* 1.9* 2.1*  INR  --   --   --  1.14     INFECTIOUS Recent Labs  Lab 07/30/17 0228  LATICACIDVEN 1.0     ENDOCRINE CBG (last 3)  Recent Labs    07/31/17 2302 08/01/17 0324 08/01/17 0838  GLUCAP 156* 115* 134*         IMAGING x48h  - image(s) personally visualized  -   highlighted in bold Dg Chest 1 View  Result Date: 07/30/2017 CLINICAL DATA:  Hypoxia EXAM: CHEST 1 VIEW COMPARISON:  Study obtained earlier in the day FINDINGS: Endotracheal tube tip is 5.1 cm above the carina. Nasogastric tube tip and side port are below the diaphragm. No pneumothorax. There is airspace consolidation in the left  lower lobe. Lungs elsewhere are clear. Heart is upper normal in size with pulmonary vascularity within normal limits. No adenopathy. No evident bone lesions. IMPRESSION: Airspace consolidation consistent with pneumonia left base. Slightly more opacity noted in this area compared to earlier in the day. Stable cardiac silhouette. Tube positions as described without pneumothorax. Electronically Signed   By: Chrissie Noa  Margarita Grizzle III M.D.   On: 07/30/2017 14:56   Dg Chest Port 1 View  Result Date: 07/31/2017 CLINICAL DATA:  56 year old male with recent MRA assay bacteremia. Intubated for respiratory insufficiency. EXAM: PORTABLE CHEST 1 VIEW COMPARISON:  07/30/2017 and earlier. FINDINGS: Portable AP semi upright view at 0437 hours. Stable endotracheal tube tip between the level the clavicles and carina. Enteric tube in place with tip not clearly identified. Stable lung volumes. Stable cardiac size and mediastinal contours. Patchy in veiling bibasilar opacity, greater on the left, with ventilation not significantly changed since 07/27/2017. No pneumothorax or pulmonary edema. IMPRESSION: 1.  Stable lines and tubes. 2. Stable ventilation since 07/27/2017. Bibasilar patchy opacity, more confluent on the left. Consider atelectasis and/or pneumonia. Electronically Signed   By: Odessa Fleming M.D.   On: 07/31/2017 07:15      DISCUSSION: 56 year old male with MRSA bacteremia with mitral valve vegetation. Being treated with vancomycin and followed by ID. 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  Extubated on 12/10.  Looks surprisingly well.  For today plan is as follows: Get out of bed, physical therapy consult, start diet, continue antibiotics.  We will ask dental medicine to see him as thoracic surgery is concerned about his poor dental health and feels he will require several dental extractions, we can get the imaging for this today also  will get CT imaging of chest and abdomen per recommendations of infectious disease.  He will need left and right heart cath.  He is ready for transfer to [facility] stepdown unit.  Defer timing of surgery to the thoracic surgery discretion  ASSESSMENT / PLAN:  MRSA bacteremia and mitral valve endocarditis -No history of IV drug abuse.  Source of nidus of infection not entirely clear but thoracic surgery also raises concern for necrotic/loose teeth Would be surgical candidate if: a) pulm status improved and can be extubated b) has dental consult and likely extractions c) has left and right heart cath d) has no worsening of LFTs and finally would add e) improvement in mental status.  -Extubated, hemodynamically stable Spoke to infectious disease on 12/10; cultures still not clearing even from 12/10!  We changed to vancomycin to daptomycin and added ceftaroline on 12/10 -I wonder if he will not clear his cultures until he has had surgery Plan  Dental consult: Will need several teeth extractions-->dental wants Orthopantogram once at [facility]  [facility]down Will need left and right heart cath (getting dye load today w/ CT imaging so will get BMP in am and decreased lasix) **Infectious disease has recommended imaging of spine via MRI, we were unable to do that here at the [facility], may be able to do this successfully once he is at [facility]-->i've asked them to re-eval this after current imaging. 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  This continues to demonstrate bilateral airspace disease there is no significant change when compared to prior day support tubes  and lines in satisfactory position. -7.9 L negative since admit -Extubated 12/10  Plan Wean oxygen Incentive spirometry Out of bed   Hypertension Plan Continuing Lasix and hydralazine  Abnormal LFTs, improved Plan Intermittent LFTs  DM with hyperglycemia Plan We will change sliding scale insulin to before meals and at bedtime with mealtime coverage  AAA Plan Follow-up CT imaging at 1 year  Anemia of critical illness Plan Continue low molecular weight heparin Trend CBC Transfuse per protocol  DVT prophylaxis: LMWH SUP: PPI  Diet: tubefeeds Activity: BR Disposition : ICU  My cct 60 minutes  Simonne MartinetPeter E Daveon Arpino ACNP-BC [facility] Pulmonary/Critical Care Pager # [phone] OR # [phone] if no answer  08/01/2017 8:51 AM

## 2017-08-01 NOTE — Progress Notes (Signed)
Progress Note  Patient Name: Reginald [name] Date of Encounter: 08/01/2017  Primary Cardiologist: New patient/ Dr Rennis GoldenHilty  Subjective   Extubated, feels ok, no CP or SOB.   Inpatient Medications    Scheduled Meds: . iopamidol      . chlorhexidine  15 mL Mouth Rinse BID  . Chlorhexidine Gluconate Cloth  6 each Topical Q0600  . enoxaparin (LOVENOX) injection  50 mg Subcutaneous Q24H  . folic acid  1 mg Intravenous Daily  . [START ON 08/02/2017] furosemide  40 mg Intravenous Daily  . insulin aspart  0-20 Units Subcutaneous TID AC & HS  . mouth rinse  15 mL Mouth Rinse q12n4p  . mupirocin ointment  1 application Nasal BID  . potassium chloride  20 mEq Oral Daily  . thiamine  100 mg Intravenous Daily   Or  . thiamine  100 mg Oral Daily   Continuous Infusions: . sodium chloride 20 mL/hr at 07/31/17 0800  . ceFTAROline (TEFLARO) IV Stopped (07/31/17 2201)  . DAPTOmycin (CUBICIN)  IV Stopped (07/31/17 1433)   PRN Meds: acetaminophen (TYLENOL) oral liquid 160 mg/5 mL, hydrALAZINE, ipratropium-albuterol, [DISCONTINUED] ondansetron **OR** ondansetron (ZOFRAN) IV   Vital Signs    Vitals:   08/01/17 0700 08/01/17 0800 08/01/17 0825 08/01/17 0900  BP: (!) 141/62 (!) 156/73  (!) 154/80  Pulse: 85 90  92  Resp: (!) 28 (!) 22  (!) 28  Temp:   98.8 F (37.1 C)   TempSrc:   Oral   SpO2: 97% 99%  98%  Weight:      Height:        Intake/Output Summary (Last 24 hours) at 08/01/2017 1021 Last data filed at 08/01/2017 0500 Gross per 24 hour  Intake 957.25 ml  Output 2225 ml  Net -1267.75 ml   Filed Weights   07/30/17 0527 07/31/17 0416 08/01/17 0326  Weight: 251 lb 5.2 oz (114 kg) 244 lb 0.8 oz (110.7 kg) 238 lb 12.1 oz (108.3 kg)    Telemetry    NSr - Personally Reviewed  ECG    No new tracing - Personally Reviewed  Physical Exam  In NAD GEN: No acute distress.   Neck: No JVD Cardiac: RRR, 2/6 holosystolic murmur at apex, no diastolic murmurs, rubs, or gallops.   Respiratory: Clear to auscultation bilaterally. GI: Soft, nontender, non-distended  MS: No edema; No deformity. Neuro:  Nonfocal  Psych: Normal affect   Labs    Chemistry Recent Labs  Lab 07/27/17 0322 07/28/17 0501 07/29/17 0233 07/30/17 0228  NA 134* 137 140 143  K 4.2 4.0 4.1 4.1  CL 98* 101 104 106  CO2 30 30 31 31   GLUCOSE 200* 207* 202* 190*  BUN 21* 38* 39* 42*  CREATININE 0.61 0.81 0.69 0.79  CALCIUM 8.4* 8.4* 8.7* 9.0  PROT 6.7 6.8  --  7.9  ALBUMIN 2.0* 1.9*  --  2.1*  AST 40 30  --  33  ALT 50 39  --  38  ALKPHOS 217* 187*  --  207*  BILITOT 2.0* 1.7*  --  1.4*  GFRNONAA >60 >60 >60 >60  GFRAA >60 >60 >60 >60  ANIONGAP 6 6 5 6      Hematology Recent Labs  Lab 07/30/17 0228 07/31/17 0324 08/01/17 0344  WBC 10.7* 10.9* 10.2  RBC 3.26* 3.23* 3.57*  HGB 10.2* 10.1* 11.2*  HCT 32.1* 32.3* 35.5*  MCV 98.5 100.0 99.4  MCH 31.3 31.3 31.4  MCHC 31.8 31.3 31.5  RDW  14.2 14.2 14.2  PLT 222 252 PLATELET CLUMPS NOTED ON SMEAR, COUNT APPEARS ADEQUATE   Cardiac EnzymesNo results for input(s): TROPONINI in the last 168 hours. No results for input(s): TROPIPOC in the last 168 hours.   BNP Recent Labs  Lab 07/26/17 0340  BNP 104.6*    DDimer No results for input(s): DDIMER in the last 168 hours.   Radiology    Dg Chest 1 View  Result Date: 07/30/2017 CLINICAL DATA:  Hypoxia EXAM: CHEST 1 VIEW COMPARISON:  Study obtained earlier in the day FINDINGS: Endotracheal tube tip is 5.1 cm above the carina. Nasogastric tube tip and side port are below the diaphragm. No pneumothorax. There is airspace consolidation in the left lower lobe. Lungs elsewhere are clear. Heart is upper normal in size with pulmonary vascularity within normal limits. No adenopathy. No evident bone lesions. IMPRESSION: Airspace consolidation consistent with pneumonia left base. Slightly more opacity noted in this area compared to earlier in the day. Stable cardiac silhouette. Tube positions as  described without pneumothorax. Electronically Signed   By: Bretta BangWilliam  Woodruff III M.D.   On: 07/30/2017 14:56   Dg Chest Port 1 View  Result Date: 07/31/2017 CLINICAL DATA:  56 year old male with recent MRA assay bacteremia. Intubated for respiratory insufficiency. EXAM: PORTABLE CHEST 1 VIEW COMPARISON:  07/30/2017 and earlier. FINDINGS: Portable AP semi upright view at 0437 hours. Stable endotracheal tube tip between the level the clavicles and carina. Enteric tube in place with tip not clearly identified. Stable lung volumes. Stable cardiac size and mediastinal contours. Patchy in veiling bibasilar opacity, greater on the left, with ventilation not significantly changed since 07/27/2017. No pneumothorax or pulmonary edema. IMPRESSION: 1.  Stable lines and tubes. 2. Stable ventilation since 07/27/2017. Bibasilar patchy opacity, more confluent on the left. Consider atelectasis and/or pneumonia. Electronically Signed   By: Odessa FlemingH  Hall M.D.   On: 07/31/2017 07:15    Cardiac Studies   07/27/2017 TEE  - Left ventricle: Normal LV size. Moderate LVH. Systolic function   was normal. The estimated ejection fraction was in the range of   60% to 65%. Wall motion was normal; there were no regional wall   motion abnormalities. - Aortic valve: Trileaflet. Small echogenic mass at the tip of the   non-coronary cusp. lambl&'s excrescences. Trivial AI. - Aorta: Grade 3 mobile atheroma of the proximal aortic arch. - Mitral valve: Large 2-3 cm mass of the A3 segment of the anterior   leaflet, prolapsing across the posterior leaflet. There is a   distinct posterior jet which is centrally directed of severe   regurgitation, suggestive of perforation. There is dropout seen   through the vegetation or in the area of the P3 scallop - not   entirely clear, but concerning for possible perforation. There is   vegetation to a lesser extent on the posterior leaflet tip.   Effective regurgitant orifice (PISA): 1.01 cm^2.  Regurgitant   volume (PISA): 155 ml. - Left atrium: The atrium was dilated. No evidence of thrombus in   the atrial cavity or appendage. - Right atrium: No evidence of thrombus in the atrial cavity or   appendage. - Atrial septum: No defect or patent foramen ovale was identified. - Pulmonic valve: No evidence of vegetation.  Impressions: - Large mass of the A3 segment of the anterior mitral leaflet,   prolapsing over the posterior leaflet. Posteriorly located   central regurgitant jet suggestive of possible perforation - in   the region of the P3  segment. Endocarditis of the posterior   leaflet tip is noted. There is a possible small mass on the   non-coronary cusp of the aortic valve with trivial AI - this   could be resolved endocarditis or sclerosis as well.    Patient Profile     56 y.o. 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  Assessment & Plan    1. NWG:NFAOZHYQ well overnight, negative 1.6 L and down 6 lbs. Extubated, lasix down to 40 iv daily as he is also getting CT with contrast today. Crea is stable.   2. Severe MR: will eventually need MV replacement.BCx still positive 12/10, CT surgery until BCx are sterile.  3. 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 High risk for more strokes. Would proceed with surgery sooner rather than later, but need sterile BCx first. Still positive yesterday,  ID follows vancomycin was switched to daptomycin, he is getting CT chest/abdomen/pelvis to look for another possible source. He is getting [facility] to [facility] today, we will plan for a left heart catheterization tomorrow. We will follow Crea in the am given he is getting iodine contrast today.  He is still in liquid diet. I will start  metoprolol 5 mg iv Q6H, switch to PO once he is able to eat.  For questions or updates, please contact [facility] Please consult [email or web address] for contact info under Cardiology/STEMI.      Signed, Tobias Alexander, MD  08/01/2017, 10:21 AM

## 2017-08-01 NOTE — Progress Notes (Signed)
DENTAL CONSULTATION  Date of Consultation:  08/01/2017 Patient Name:   Reginald [name] Date of Birth:   07/02/1961 Medical Record Number: [id number]  VITALS: BP (!) 154/80   Pulse 92   Temp 98.7 F (37.1 C) (Oral)   Resp (!) 28   Ht 5\' 9"  (1.753 m)   Wt 238 lb 12.1 oz (108.3 kg)   SpO2 98%   BMI 35.26 kg/m   CHIEF COMPLAINT: The patient was referred by Dr. Kendrick FriesMcQuaid as well as Dr. Kathlee NationsPeter Van Trigt for dental consultation.    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  Patient with anticipated mitral valve replacement in the future.  Patient is now seen as part of a pre-heart valve surge dental protocol examination to rule out dental infection that may further affect the patient's systemic health and the anticipated heart valve surgery.  The patient currently denies having any acute toothaches, swellings or abscesses.  Patient has had previous toothache symptoms over the past several months.  Patient last saw a dentist approximately 5 years ago in [place], [place].  Patient was wanting to proceed with extraction of remaining teeth at that time, but treatment was unable to be provided secondary to significant hypertension by report of the family.  Patient denies having regular dental care.  Patient denies having a regular dentist.  Patient denies having dental phobia.  The patient denies having partial dentures.  PROBLEM LIST: Patient Active Problem List   Diagnosis Date Noted  . 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 Acute respiratory failure (HCC) 07/26/2017  . Alcohol abuse 07/26/2017  . Thrombocytopenia (HCC) 07/26/2017  . Abnormal liver enzymes 07/26/2017  . Acute encephalopathy 07/26/2017  . Acute diastolic CHF (congestive heart failure) (HCC) 07/26/2017  . Sepsis (HCC)   . Endocarditis of mitral valve   . Cerebral septic emboli (HCC)   . MRSA bacteremia 07/25/2017    PMH: Past Medical History:   Diagnosis Date  . Alcohol abuse   . Diabetes mellitus type 2 in obese (HCC)   . Neck pain   . Tobacco abuse     PSH: Past Surgical History:  Procedure Laterality Date  . HERNIA REPAIR  2007  . Tonsillectomy /adnoidectomy     as aa child    ALLERGIES: Allergies  Allergen Reactions  . Morphine And Related     MEDICATIONS: Current Facility-Administered Medications  Medication Dose Route Frequency Provider Last Rate Last Dose  . 0.9 %  sodium chloride infusion   Intravenous Continuous Berton BonHammond, Janine, NP 20 mL/hr at 07/31/17 0800    . acetaminophen (TYLENOL) solution 650 mg  650 mg Per Tube Q6H PRN Karl ItoSommer, Steven E, MD      . ceftaroline (TEFLARO) 600 mg in sodium chloride 0.9 % 250 mL IVPB  600 mg Intravenous Q12H Judyann MunsonSnider, Cynthia, MD 250 mL/hr at 08/01/17 1138 600 mg at 08/01/17 1138  . chlorhexidine (PERIDEX) 0.12 % solution 15 mL  15 mL Mouth Rinse BID Max FickleMcQuaid, Douglas B, MD   15 mL at 08/01/17 1139  . Chlorhexidine Gluconate Cloth 2 % PADS 6 each  6 each Topical Q0600 Kalman Shanamaswamy, Murali, MD   6 each at 07/30/17 0600  . DAPTOmycin (CUBICIN) 700 mg in sodium chloride 0.9 % IVPB  700 mg Intravenous Q24H Aleda GranaZeigler, Dustin G, RPH   Stopped at 07/31/17 1433  . enoxaparin (LOVENOX) injection 50 mg  50 mg Subcutaneous Q24H Aleda GranaZeigler, Dustin G, RPH   50 mg at 08/01/17  1138  . folic acid injection 1 mg  1 mg Intravenous Daily Roslynn Amble, MD   1 mg at 08/01/17 1137  . [START ON 08/02/2017] furosemide (LASIX) injection 40 mg  40 mg Intravenous Daily Zenia Resides E, NP      . hydrALAZINE (APRESOLINE) injection 10-20 mg  10-20 mg Intravenous Q4H PRN Lupita Leash, MD   20 mg at 08/01/17 0445  . insulin aspart (novoLOG) injection 0-20 Units  0-20 Units Subcutaneous TID AC & HS Zenia Resides E, NP      . iopamidol (ISOVUE-300) 61 % injection           . ipratropium-albuterol (DUONEB) 0.5-2.5 (3) MG/3ML nebulizer solution 3 mL  3 mL Nebulization Q4H PRN Smith, Rondell A, MD      .  MEDLINE mouth rinse  15 mL Mouth Rinse q12n4p McQuaid, Douglas B, MD      . metoprolol tartrate (LOPRESSOR) injection 5 mg  5 mg Intravenous Q6H Lars Masson, MD   5 mg at 08/01/17 1136  . ondansetron (ZOFRAN) injection 4 mg  4 mg Intravenous Q6H PRN Smith, Rondell A, MD      . potassium chloride 20 MEQ/15ML (10%) solution 20 mEq  20 mEq Oral Daily Simonne Martinet, NP      . thiamine (B-1) injection 100 mg  100 mg Intravenous Daily Roslynn Amble, MD   100 mg at 08/01/17 1136   Or  . thiamine (VITAMIN B-1) tablet 100 mg  100 mg Oral Daily Roslynn Amble, MD        LABS: Lab Results  Component Value Date   WBC 10.2 08/01/2017   HGB 11.2 (L) 08/01/2017   HCT 35.5 (L) 08/01/2017   MCV 99.4 08/01/2017   PLT  08/01/2017    PLATELET CLUMPS NOTED ON SMEAR, COUNT APPEARS ADEQUATE      Component Value Date/Time   NA 143 07/30/2017 0228   K 4.1 07/30/2017 0228   CL 106 07/30/2017 0228   CO2 31 07/30/2017 0228   GLUCOSE 190 (H) 07/30/2017 0228   BUN 42 (H) 07/30/2017 0228   CREATININE 0.79 07/30/2017 0228   CALCIUM 9.0 07/30/2017 0228   GFRNONAA >60 07/30/2017 0228   GFRAA >60 07/30/2017 0228   Lab Results  Component Value Date   INR 1.14 07/30/2017   No results found for: PTT  SOCIAL HISTORY: Social History   Socioeconomic History  . Marital status: Married    Spouse name: Not on file  . Number of children: Not on file  . Years of education: Not on file  . Highest education level: Not on file  Social Needs  . Financial resource strain: Not on file  . Food insecurity - worry: Not on file  . Food insecurity - inability: Not on file  . Transportation needs - medical: Not on file  . Transportation needs - non-medical: Not on file  Occupational History  . Not on file  Tobacco Use  . Smoking status: Current Every Day Smoker    Types: Cigarettes  . Smokeless tobacco: Never Used  Substance and Sexual Activity  . Alcohol use: Yes    Alcohol/week: 2.4 - 3.0 oz     Types: 4 - 5 Standard drinks or equivalent per week  . Drug use: No  . Sexual activity: Not Currently    Partners: Female  Other Topics Concern  . Not on file  Social History Narrative  . Not on file  FAMILY HISTORY: Family History  Problem Relation Age of Onset  . Hypertension Mother   . Hypertension Father     REVIEW OF SYSTEMS: Reviewed with the patient as per History of present illness. Psych: Patient denies having dental phobia.  DENTAL HISTORY: CHIEF COMPLAINT: The patient was referred by Dr. Kendrick FriesMcQuaid as well as Dr. Kathlee NationsPeter Van Trigt for dental consultation.    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  Patient with anticipated mitral valve replacement in the future.  Patient is now seen as part of a pre-heart valve surge dental protocol examination to rule out dental infection that may further affect the patient's systemic health and the anticipated heart valve surgery.  The patient currently denies having any acute toothaches, swellings or abscesses.  Patient has had previous toothache symptoms over the past several months.  Patient last saw a dentist approximately 5 years ago in [place], [place] .  Patient was wanting to proceed with extraction of remaining teeth at that time, but treatment was unable to be provided secondary to significant hypertension by report of the family.  Patient denies having regular dental care.  Patient denies having a regular dentist.  Patient denies having dental phobia.  The patient denies having partial dentures.   DENTAL EXAMINATION: GENERAL: The patient is a well-developed, well-nourished male in no acute distress. HEAD AND NECK: There is no palpable neck lymphadenopathy.  The patient denies acute TMJ symptoms. INTRAORAL EXAM: The patient has normal saliva.  There is no evidence of oral abscess formation. DENTITION: The patient has multiple missing teeth and  multiple retained root segments. PERIODONTAL: The patient has chronic periodontitis with plaque and calculus accumulations, gingival recession, and tooth mobility.  There is incipient to moderate bone loss noted. DENTAL CARIES/SUBOPTIMAL RESTORATIONS: Multiple dental caries are noted. ENDODONTIC: Patient currently denies acute pulpitis symptoms.  There appears to be periapical pathology of the upper right canine.  CROWN AND BRIDGE: There are no crown or bridge restorations. PROSTHODONTIC: The patient denies having partial dentures. OCCLUSION: The patient has a poor occlusal scheme secondary to multiple missing teeth, multiple retained root segments, and supraeruption and drifting of the unopposed teeth into the edentulous areas.  RADIOGRAPHIC INTERPRETATION: Orthopantogram was taken on 08/02/2017 at [facility].  Suboptimal secondary to radiographic artifact. There are multiple missing teeth.  There are multiple retained root segments.  Multiple dental caries are noted.  There is incipient to moderate bone loss noted.  There is supraeruption and drifting of the unopposed teeth into the edentulous areas.   ASSESSMENTS: 1.  Endocarditis of the mitral valve 2.  Severe mitral regurgitation 3.  Pre-heart valve surgery dental protocol examination 4.  Multiple retained root segments 5.  Dental caries 6.  Chronic periodontitis with bone loss 7.  Gingival recession 8.  Tooth mobility  9.  Accretions 10.  Multiple missing teeth 11.  Supraeruption and drifting of the unopposed teeth into the edentulous areas 12.  No history of partial dentures 13.  Poor occlusal scheme and malocclusion 14.  Risk of bleeding with invasive dental procedures due to history of thrombocytopenia and Lovenox therapy 15.  Risk for complications up to and including death with anticipated invasive dental procedures in the operating room with general anesthesia due to the patient's cardiovascular and respiratory  compromise.  PLAN/RECOMMENDATIONS: 1. I discussed the risks, benefits, and complications of various treatment options with the patient and family in relationship to his medical and dental conditions, endocarditis of  the mitral valve, and anticipated heart valve surgery.  We discussed various treatment options to include no treatment, multiple extractions with alveoloplasty, pre-prosthetic surgery as indicated, periodontal therapy, dental restorations, root canal therapy, crown and bridge therapy, implant therapy, and replacement of missing teeth as indicated. The patient currently wishes to proceed with extraction of remaining teeth with alveoloplasty and pre-prosthetic surgery as needed in the operating room with general anesthesia once he is cleared by the cardiology team.  The patient with  anticipated cardiac catheterization at [facility] with Dr. Clifton James on Wednesday afternoon.  Dr. Clifton James will consult with Dr. Delton See concerning the ability to proceed with extraction of remaining teeth with alveoloplasty in the operating room with general anesthesia after the cardiac catheterization.  Patient will then be scheduled for the dental procedures with general anesthesia as time and space permits in the operating room schedule.    2. Discussion of findings with medical team and coordination of future medical and dental care as needed.    Charlynne Pander, DDS

## 2017-08-01 NOTE — Progress Notes (Signed)
PT Cancellation Note  Patient Details Name: Reginald [name] MRN: [id number] DOB: 02/17/1961   Cancelled Treatment:    Reason Eval/Treat Not Completed: Medical issues which prohibited therapy Per progress notes today, "getting [facility], we will plan for a left heart catheterization tomorrow."  Will check back as schedule permits.  Jenner Rosier,KATHrine E 08/01/2017, 4:03 PM Zenovia JarredKati Bomani [place]men, PT, DPT 08/01/2017 Pager: [phone]

## 2017-08-02 ENCOUNTER — Inpatient Hospital Stay (HOSPITAL_COMMUNITY): Payer: Medicaid Other

## 2017-08-02 ENCOUNTER — Encounter (HOSPITAL_COMMUNITY)
Admission: AD | Disposition: A | Discharge status: Rehabilitation Facility | Payer: Self-pay | Source: Other Acute Inpatient Hospital | Attending: Internal Medicine

## 2017-08-02 DIAGNOSIS — J069 Acute upper respiratory infection, unspecified: Secondary | ICD-10-CM

## 2017-08-02 DIAGNOSIS — Z978 Presence of other specified devices: Secondary | ICD-10-CM

## 2017-08-02 DIAGNOSIS — D735 Infarction of spleen: Secondary | ICD-10-CM

## 2017-08-02 DIAGNOSIS — I110 Hypertensive heart disease with heart failure: Secondary | ICD-10-CM

## 2017-08-02 HISTORY — PX: RIGHT/LEFT HEART CATH AND CORONARY ANGIOGRAPHY: CATH118266

## 2017-08-02 LAB — BASIC METABOLIC PANEL
Anion gap: 7 (ref 5–15)
BUN: 34 mg/dL — AB (ref 6–20)
CALCIUM: 9 mg/dL (ref 8.9–10.3)
CO2: 27 mmol/L (ref 22–32)
CREATININE: 0.83 mg/dL (ref 0.61–1.24)
Chloride: 106 mmol/L (ref 101–111)
GFR calc Af Amer: >60 mL/min (ref >60)
GLUCOSE: 197 mg/dL — AB (ref 65–99)
POTASSIUM: 3.6 mmol/L (ref 3.5–5.1)
SODIUM: 140 mmol/L (ref 135–145)

## 2017-08-02 LAB — CBC WITH DIFFERENTIAL/PLATELET
BASOS ABS: 0 10*3/uL (ref 0.0–0.1)
BASOS PCT: 0 %
EOS ABS: 0.1 10*3/uL (ref 0.0–0.7)
Eosinophils Relative: 1 %
HEMATOCRIT: 35.3 % — AB (ref 39.0–52.0)
HEMOGLOBIN: 11.1 g/dL — AB (ref 13.0–17.0)
Lymphocytes Relative: 8 %
Lymphs Abs: 0.9 10*3/uL (ref 0.7–4.0)
MCH: 31.1 pg (ref 26.0–34.0)
MCHC: 31.4 g/dL (ref 30.0–36.0)
MCV: 98.9 fL (ref 78.0–100.0)
MONO ABS: 0.3 10*3/uL (ref 0.1–1.0)
Monocytes Relative: 3 %
Neutro Abs: 9.9 10*3/uL — ABNORMAL HIGH (ref 1.7–7.7)
Neutrophils Relative %: 88 %
Platelets: 351 10*3/uL (ref 150–400)
RBC: 3.57 MIL/uL — AB (ref 4.22–5.81)
RDW: 14.2 % (ref 11.5–15.5)
WBC: 11.2 10*3/uL — AB (ref 4.0–10.5)

## 2017-08-02 LAB — POCT I-STAT 3, ART BLOOD GAS (G3+)
Acid-Base Excess: 6 mmol/L — ABNORMAL HIGH (ref 0.0–2.0)
BICARBONATE: 29.9 mmol/L — AB (ref 20.0–28.0)
O2 Saturation: 93 %
PH ART: 7.505 — AB (ref 7.350–7.450)
PO2 ART: 62 mmHg — AB (ref 83.0–108.0)
TCO2: 31 mmol/L (ref 22–32)
pCO2 arterial: 38 mmHg (ref 32.0–48.0)

## 2017-08-02 LAB — POCT I-STAT 3, VENOUS BLOOD GAS (G3P V)
Acid-Base Excess: 4 mmol/L — ABNORMAL HIGH (ref 0.0–2.0)
Bicarbonate: 28.1 mmol/L — ABNORMAL HIGH (ref 20.0–28.0)
O2 Saturation: 68 %
PCO2 VEN: 38 mmHg — AB (ref 44.0–60.0)
TCO2: 29 mmol/L (ref 22–32)
pH, Ven: 7.476 — ABNORMAL HIGH (ref 7.250–7.430)
pO2, Ven: 33 mmHg (ref 32.0–45.0)

## 2017-08-02 LAB — GLUCOSE, CAPILLARY
GLUCOSE-CAPILLARY: 201 mg/dL — AB (ref 65–99)
GLUCOSE-CAPILLARY: 182 mg/dL — AB (ref 65–99)
GLUCOSE-CAPILLARY: 139 mg/dL — AB (ref 65–99)
GLUCOSE-CAPILLARY: 147 mg/dL — AB (ref 65–99)
Glucose-Capillary: 154 mg/dL — ABNORMAL HIGH (ref 65–99)

## 2017-08-02 LAB — AMMONIA: Ammonia: 101 umol/L — ABNORMAL HIGH (ref 9–35)

## 2017-08-02 LAB — PROTIME-INR
INR: 1.25
PROTHROMBIN TIME: 15.6 s — AB (ref 11.4–15.2)

## 2017-08-02 SURGERY — RIGHT/LEFT HEART CATH AND CORONARY ANGIOGRAPHY
Anesthesia: LOCAL

## 2017-08-02 MED ORDER — RIFAMPIN 300 MG PO CAPS
300.0000 mg | ORAL_CAPSULE | Freq: Two times a day (BID) | ORAL | Status: DC
  Administered 2017-08-02 – 2017-08-08 (×12): 300 mg via ORAL
  Filled 2017-08-02 (×14): qty 1

## 2017-08-02 MED ORDER — MIDAZOLAM HCL 2 MG/2ML IJ SOLN
INTRAMUSCULAR | Status: DC | PRN
  Administered 2017-08-02: 1 mg via INTRAVENOUS

## 2017-08-02 MED ORDER — HEPARIN SODIUM (PORCINE) 1000 UNIT/ML IJ SOLN
INTRAMUSCULAR | Status: DC | PRN
  Administered 2017-08-02: 5000 [IU] via INTRAVENOUS

## 2017-08-02 MED ORDER — IOPAMIDOL (ISOVUE-370) INJECTION 76%
INTRAVENOUS | Status: AC
  Filled 2017-08-02: qty 50

## 2017-08-02 MED ORDER — ALPRAZOLAM 0.5 MG PO TABS
0.5000 mg | ORAL_TABLET | Freq: Once | ORAL | Status: AC
  Administered 2017-08-02: 0.5 mg via ORAL
  Filled 2017-08-02: qty 1

## 2017-08-02 MED ORDER — SODIUM CHLORIDE 0.9% FLUSH
3.0000 mL | Freq: Two times a day (BID) | INTRAVENOUS | Status: DC
  Administered 2017-08-03 – 2017-08-09 (×10): 3 mL via INTRAVENOUS

## 2017-08-02 MED ORDER — FENTANYL CITRATE (PF) 100 MCG/2ML IJ SOLN
INTRAMUSCULAR | Status: DC | PRN
  Administered 2017-08-02: 25 ug via INTRAVENOUS

## 2017-08-02 MED ORDER — VERAPAMIL HCL 2.5 MG/ML IV SOLN
INTRAVENOUS | Status: AC
  Filled 2017-08-02: qty 2

## 2017-08-02 MED ORDER — SODIUM CHLORIDE 0.9% FLUSH: 3.0000 mL | INTRAVENOUS | Status: DC | PRN

## 2017-08-02 MED ORDER — LIDOCAINE HCL (PF) 1 % IJ SOLN
INTRAMUSCULAR | Status: AC
  Filled 2017-08-02: qty 30

## 2017-08-02 MED ORDER — IOPAMIDOL (ISOVUE-370) INJECTION 76%
INTRAVENOUS | Status: AC
  Filled 2017-08-02: qty 100

## 2017-08-02 MED ORDER — IOPAMIDOL (ISOVUE-370) INJECTION 76%
INTRAVENOUS | Status: DC | PRN
  Administered 2017-08-02: 195 mL via INTRAVENOUS

## 2017-08-02 MED ORDER — MIDAZOLAM HCL 2 MG/2ML IJ SOLN
INTRAMUSCULAR | Status: AC
  Filled 2017-08-02: qty 2

## 2017-08-02 MED ORDER — HEPARIN (PORCINE) IN NACL 2-0.9 UNIT/ML-% IJ SOLN
INTRAMUSCULAR | Status: AC | PRN
  Administered 2017-08-02: 1000 mL

## 2017-08-02 MED ORDER — SODIUM CHLORIDE 0.9 % IV SOLN
INTRAVENOUS | Status: AC
  Administered 2017-08-02: 17:00:00 via INTRAVENOUS

## 2017-08-02 MED ORDER — LIDOCAINE HCL (PF) 1 % IJ SOLN
INTRAMUSCULAR | Status: DC | PRN
  Administered 2017-08-02: 2 mL via SUBCUTANEOUS
  Administered 2017-08-02: 10 mL

## 2017-08-02 MED ORDER — FUROSEMIDE 10 MG/ML IJ SOLN
40.0000 mg | Freq: Two times a day (BID) | INTRAMUSCULAR | Status: DC
  Administered 2017-08-02 – 2017-08-04 (×5): 40 mg via INTRAVENOUS
  Filled 2017-08-02 (×5): qty 4

## 2017-08-02 MED ORDER — HEPARIN (PORCINE) IN NACL 2-0.9 UNIT/ML-% IJ SOLN
INTRAMUSCULAR | Status: DC | PRN
  Administered 2017-08-02: 10 mL via INTRA_ARTERIAL

## 2017-08-02 MED ORDER — FENTANYL CITRATE (PF) 100 MCG/2ML IJ SOLN
INTRAMUSCULAR | Status: AC
  Filled 2017-08-02: qty 2

## 2017-08-02 MED ORDER — HEPARIN (PORCINE) IN NACL 2-0.9 UNIT/ML-% IJ SOLN
INTRAMUSCULAR | Status: AC
  Filled 2017-08-02: qty 1000

## 2017-08-02 MED ORDER — HEPARIN SODIUM (PORCINE) 1000 UNIT/ML IJ SOLN
INTRAMUSCULAR | Status: AC
  Filled 2017-08-02: qty 1

## 2017-08-02 MED ORDER — METOPROLOL TARTRATE 25 MG PO TABS
25.0000 mg | ORAL_TABLET | Freq: Two times a day (BID) | ORAL | Status: DC
  Administered 2017-08-02 – 2017-08-10 (×16): 25 mg via ORAL
  Filled 2017-08-02 (×16): qty 1

## 2017-08-02 MED ORDER — SODIUM CHLORIDE 0.9 % IV SOLN
1000.0000 mg | INTRAVENOUS | Status: DC
  Administered 2017-08-02 – 2017-08-10 (×9): 1000 mg via INTRAVENOUS
  Filled 2017-08-02 (×12): qty 20

## 2017-08-02 MED ORDER — SODIUM CHLORIDE 0.9 % IV SOLN: 250.0000 mL | INTRAVENOUS | Status: DC | PRN

## 2017-08-02 SURGICAL SUPPLY — 18 items
CATH 5FR JL3.5 JR4 ANG PIG MP (CATHETERS) ×2 IMPLANT
CATH INFINITI 5 FR 3DRC (CATHETERS) ×2 IMPLANT
CATH INFINITI 5FR AL1 (CATHETERS) ×2 IMPLANT
CATH LAUNCHER 5F RADR (CATHETERS) ×1 IMPLANT
CATH SWAN GANZ 7F STRAIGHT (CATHETERS) ×2 IMPLANT
CATH VISTA GUIDE 6FR XBLAD3.5 (CATHETERS) ×2 IMPLANT
CATHETER LAUNCHER 5F RADR (CATHETERS) ×2
COVER PRB 48X5XTLSCP FOLD TPE (BAG) ×1 IMPLANT
COVER PROBE 5X48 (BAG) ×1
DEVICE RAD COMP TR BAND LRG (VASCULAR PRODUCTS) ×2 IMPLANT
GLIDESHEATH SLEND SS 6F .021 (SHEATH) ×2 IMPLANT
GUIDEWIRE INQWIRE 1.5J.035X260 (WIRE) ×1 IMPLANT
INQWIRE 1.5J .035X260CM (WIRE) ×2
KIT HEART LEFT (KITS) ×2 IMPLANT
PACK CARDIAC CATHETERIZATION (CUSTOM PROCEDURE TRAY) ×2 IMPLANT
SHEATH PINNACLE 7F 10CM (SHEATH) ×2 IMPLANT
TRANSDUCER W/STOPCOCK (MISCELLANEOUS) ×2 IMPLANT
TUBING CIL FLEX 10 FLL-RA (TUBING) ×2 IMPLANT

## 2017-08-02 NOTE — Consult Note (Signed)
Physical Medicine and Rehabilitation Consult   Reason for Consult: Debility  Referring Physician: Dr. Margo Aye   HPI: Reginald [name] is a 56 y.o. 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 Hospital course significant for acute on chronic CHF and alcohol withdrawal with  confusion with agitation.  He was found to have 4.5 cm AAA as well as cervical DDD.  He was transferred to [facility] for evaluation and treatment. MRI brain done revealing  Multiple small areas of restricted diffusion bilaterally most compatible with septic emboli. Dr. Drue Second consulted for input and recommended continuing antibiotics as well as TEE for work up.   TEE done revealing large mass on anterior mitarl leaflet with central regurgitation jet suggestive for possible perforation as well as possible small mass on AV (could be due to resolved endocarditis).   He had issues with agitation requiring precedex but developed obtundation requiring intubation for airway protection on 12/6. Repeat BC 12/7 and 12/10 positive and CT abdomen/chest showed splenic emboli and possible PNA.   Rifampin added to ceftaroline/daptomycin on 12/12.   Neurology consulted for input and recommended CIWA protocol and treating infection--no ASA needed. CTA head was negative for large or medium vessel stenosis or occlusion. Cardiology following to help manage CHF and he underwent diagnostic cardiac cath yesterday.  Plans for dental extraction of necrotic teeth and Dr. Donata Clay following for input on surgery. Cardiology recommends surgery once BC sterile and sooner rather than later to prevent recurrent embolization.  He tolerated extubation on 12/10 and therapy evaluation yesterday revealed debilitated state. CIR recommended due to functional deficits.      Review of Systems  HENT: Negative for hearing loss.   Eyes: Negative for blurred vision and  double vision.  Respiratory: Positive for shortness of breath.   Cardiovascular: Negative for chest pain.  Gastrointestinal: Negative for abdominal pain and heartburn.  Genitourinary: Negative for dysuria and urgency.  Musculoskeletal: Negative for back pain and myalgias.  Skin: Negative for itching and rash.  Neurological: Positive for dizziness (in the past), weakness and headaches. Negative for tingling and tremors.  Psychiatric/Behavioral: Positive for memory loss.     Past Medical History:  Diagnosis Date  . Alcohol abuse   . Diabetes mellitus type 2 in obese (HCC)   . Neck pain   . Tobacco abuse     Past Surgical History:  Procedure Laterality Date  . HERNIA REPAIR  2007  . Tonsillectomy /adnoidectomy     as aa child    Family History  Problem Relation Age of Onset  . Hypertension Mother   . Hypertension Father     Social History:  Lives with girlfriend. Works as a Gaffer (fixes everything)? He reports that he has been smoking cigarettes--1 PPD.  he has never used smokeless tobacco. He reports that he drinks about 2.4 - 3.0 oz of alcohol per week. He reports that he does not use drugs.    Allergies  Allergen Reactions  . Morphine And Related     Medications Prior to Admission  Medication Sig Dispense Refill  . amLODipine (NORVASC) 10 MG tablet Take 10 mg by mouth daily.  0  . diazepam (VALIUM) 2 MG tablet Take 2 mg by mouth 2 (two) times daily.  1  . gabapentin (NEURONTIN) 300 MG capsule Take 300 mg by mouth 2 (two) times daily.  1  . glipiZIDE (  GLUCOTROL) 10 MG tablet Take 10 mg by mouth daily.  2  . hydrochlorothiazide (HYDRODIURIL) 25 MG tablet Take 25 mg by mouth daily.  3  . lisinopril (PRINIVIL,ZESTRIL) 20 MG tablet Take 20 mg by mouth daily.  3  . metFORMIN (GLUCOPHAGE) 500 MG tablet Take 500 mg by mouth 2 (two) times daily.  2  . naproxen sodium (ALEVE) 220 MG tablet Take 220-440 mg by mouth 2 (two) times daily as needed (pain).    . sildenafil  (REVATIO) 20 MG tablet Take 50 mg by mouth as directed. Take 50mg  by mouth 1 hour before intercourse  0    Home: Home Living Family/patient expects to be discharged to:: Private residence Living Arrangements: Spouse/significant other Available Help at Discharge: Family Type of Home: House Home Access: Level entry Home Layout: One level Home Equipment: None  Functional History: Prior Function Level of Independence: Independent Functional Status:  Mobility: Bed Mobility Overal bed mobility: Needs Assistance Bed Mobility: Supine to Sit, Sit to Supine Supine to sit: +2 for physical assistance, Mod assist Sit to supine: +2 for physical assistance, Min assist General bed mobility comments: Assist to bring legs off of bed and elevate trunk into sitting. Assist to lower trunk and bring legs up into bed returning to supine. Transfers Overall transfer level: Needs assistance Equipment used: 4-wheeled walker Transfers: Sit to/from Stand Sit to Stand: +2 physical assistance, Mod assist General transfer comment: Assist to bring hips up and for balance. Verbal cues for hand placement. Ambulation/Gait Ambulation/Gait assistance: +2 safety/equipment, Mod assist Ambulation Distance (Feet): 3 Feet Assistive device: Rolling walker (2 wheeled) Gait Pattern/deviations: Step-through pattern, Decreased step length - right, Decreased step length - left, Shuffle, Trunk flexed General Gait Details: Assist for balance and support.  Gait velocity: decr Gait velocity interpretation: Below normal speed for age/gender    ADL:    Cognition: Cognition Overall Cognitive Status: Impaired/Different from baseline Orientation Level: Oriented to person, Oriented to place, Oriented to time Cognition Arousal/Alertness: Awake/alert(sleepy but easily arousable) Behavior During Therapy: WFL for tasks assessed/performed Overall Cognitive Status: Impaired/Different from baseline Area of Impairment: Attention,  Memory, Following commands, Safety/judgement, Problem solving Current Attention Level: Sustained Memory: Decreased short-term memory Following Commands: Follows one step commands consistently, Follows one step commands with increased time Safety/Judgement: Decreased awareness of safety, Decreased awareness of deficits Problem Solving: Slow processing, Difficulty sequencing, Requires verbal cues, Requires tactile cues   Blood pressure (!) 147/83, pulse 95, temperature 98.3 F (36.8 C), temperature source Oral, resp. rate (!) 23, height 5\' 9"  (1.753 m), weight 108.8 kg (239 lb 13.8 oz), SpO2 96 %. Physical Exam  Nursing note and vitals reviewed. Constitutional: He is oriented to person, place, and time. He appears well-developed and well-nourished. No distress.  Morbidly obese  HENT:  Head: Normocephalic and atraumatic.  Mouth/Throat: Oropharynx is clear and moist.  Teeth with multiple caries  Eyes: Conjunctivae are normal. Pupils are equal, round, and reactive to light.  Neck: Normal range of motion. Neck supple.  Cardiovascular: Regular rhythm.  Murmur heard. Respiratory: Effort normal and breath sounds normal. No stridor. No respiratory distress. He has no wheezes.  GI: Soft. Bowel sounds are normal. He exhibits no distension. There is no tenderness.  Musculoskeletal: He exhibits no edema or tenderness.  Neurological: He is alert and oriented to person, place, and time.  Speech soft and slow. Slow to process. Able to answer orientation questions with minimal cues. He was able to follow simple motor commands without difficulty. Diffuse weakness.  Strength grossly 4/5 prox to distal in UE and 3+ HF to 4-KE, 4/5 ADF/PF in LE's.   Skin: Skin is warm and dry. He is not diaphoretic.  Psychiatric: His affect is blunt. His speech is delayed. He is slowed.    Results for orders placed or performed during the hospital encounter of 07/25/17 (from the past 24 hour(s))  Glucose, capillary      Status: Abnormal   Collection Time: 08/01/17  4:10 PM  Result Value Ref Range   Glucose-Capillary 168 (H) 65 - 99 mg/dL   Comment 1 Notify RN    Comment 2 Document in Chart   Glucose, capillary     Status: Abnormal   Collection Time: 08/01/17  7:50 PM  Result Value Ref Range   Glucose-Capillary 220 (H) 65 - 99 mg/dL  Glucose, capillary     Status: Abnormal   Collection Time: 08/01/17 10:00 PM  Result Value Ref Range   Glucose-Capillary 210 (H) 65 - 99 mg/dL  Protime-INR     Status: Abnormal   Collection Time: 08/02/17  2:42 AM  Result Value Ref Range   Prothrombin Time 15.6 (H) 11.4 - 15.2 seconds   INR 1.25   Basic metabolic panel     Status: Abnormal   Collection Time: 08/02/17  4:55 AM  Result Value Ref Range   Sodium 140 135 - 145 mmol/L   Potassium 3.6 3.5 - 5.1 mmol/L   Chloride 106 101 - 111 mmol/L   CO2 27 22 - 32 mmol/L   Glucose, Bld 197 (H) 65 - 99 mg/dL   BUN 34 (H) 6 - 20 mg/dL   Creatinine, Ser 1.61 0.61 - 1.24 mg/dL   Calcium 9.0 8.9 - 09.6 mg/dL   GFR calc non Af Amer >60 >60 mL/min   GFR calc Af Amer >60 >60 mL/min   Anion gap 7 5 - 15  CBC with Differential/Platelet     Status: Abnormal   Collection Time: 08/02/17  7:10 AM  Result Value Ref Range   WBC 11.2 (H) 4.0 - 10.5 K/uL   RBC 3.57 (L) 4.22 - 5.81 MIL/uL   Hemoglobin 11.1 (L) 13.0 - 17.0 g/dL   HCT 04.5 (L) 40.9 - 81.1 %   MCV 98.9 78.0 - 100.0 fL   MCH 31.1 26.0 - 34.0 pg   MCHC 31.4 30.0 - 36.0 g/dL   RDW 91.4 78.2 - 95.6 %   Platelets 351 150 - 400 K/uL   Neutrophils Relative % 88 %   Neutro Abs 9.9 (H) 1.7 - 7.7 K/uL   Lymphocytes Relative 8 %   Lymphs Abs 0.9 0.7 - 4.0 K/uL   Monocytes Relative 3 %   Monocytes Absolute 0.3 0.1 - 1.0 K/uL   Eosinophils Relative 1 %   Eosinophils Absolute 0.1 0.0 - 0.7 K/uL   Basophils Relative 0 %   Basophils Absolute 0.0 0.0 - 0.1 K/uL  Glucose, capillary     Status: Abnormal   Collection Time: 08/02/17  9:46 AM  Result Value Ref Range    Glucose-Capillary 201 (H) 65 - 99 mg/dL   Comment 1 Notify RN   Ammonia     Status: Abnormal   Collection Time: 08/02/17 10:49 AM  Result Value Ref Range   Ammonia 101 (H) 9 - 35 umol/L  Glucose, capillary     Status: Abnormal   Collection Time: 08/02/17 11:39 AM  Result Value Ref Range   Glucose-Capillary 182 (H) 65 - 99 mg/dL  Comment 1 Notify RN    Dg Orthopantogram  Result Date: 08/02/2017 CLINICAL DATA:  Left maxillary pain for 2 weeks EXAM: ORTHOPANTOGRAM/PANORAMIC COMPARISON:  07/28/2017 FINDINGS: Multiple dental caries are noted throughout the mandible but primarily within a right molar. Multiple caries are noted within the residual teeth in the maxilla as well. No definitive changes of periapical abscess are seen. Mild mucosal thickening is noted within the left maxillary sinus similar to that noted on prior CT. IMPRESSION: Multiple dental caries.  No definitive periapical abscess is noted. Mucosal thickening within the left maxillary sinus. This could be related to the patient's underlying discomfort. Electronically Signed   By: Alcide CleverMark  Lukens M.D.   On: 08/02/2017 08:45   Ct Chest W Contrast  Result Date: 08/01/2017 CLINICAL DATA:  56 year old male with altered mental status and methicillin-resistant Staph aureus bacteremia. COPD. Subsequent encounter. EXAM: CT CHEST, ABDOMEN, AND PELVIS WITH CONTRAST TECHNIQUE: Multidetector CT imaging of the chest, abdomen and pelvis was performed following the standard protocol during bolus administration of intravenous contrast. CONTRAST:  100mL ISOVUE-300 IOPAMIDOL (ISOVUE-300) INJECTION 61% COMPARISON:  07/31/2017 chest x-ray. 07/20/2017 and chest CT. 08/10/2013 CT angiogram abdomen and pelvis. FINDINGS: CT CHEST FINDINGS Cardiovascular: Top-normal heart size. Prominent 3 vessel coronary artery calcification. Calcification thoracic aorta. Ascending thoracic aorta measures up to 4.2 cm. Exam not optimized to evaluate for pulmonary embolus. No large  central pulmonary embolus. Mediastinum/Nodes: Slightly enlarged calcified lymph node lower right paraesophageal region measuring up to 1.3 cm. Lungs/Pleura: Interval development of pulmonary edema and small pleural effusions. Bibasilar consolidation suggestive of atelectasis versus infiltrates. Additionally, subtle patchy hazy parenchymal changes in the upper lung zones bilaterally raises possibly of multifocal pneumonia. Musculoskeletal: Remote left-sided rib fractures. CT ABDOMEN PELVIS FINDINGS Hepatobiliary: Slightly enlarged minimally lobulated liver spanning over 19 cm. Cannot exclude changes of early cirrhosis. No worrisome hepatic lesion. No worrisome focal hepatic lesion. Pancreas: No worrisome pancreatic mass or inflammation. Spleen: Enlarged spleen spanning over 16 cm with peripheral wedge-shaped hypodensities raises possibility of embolic splenic infarcts. Adrenals/Urinary Tract: No obstructing stone or hydronephrosis. Small wedge-shaped hypodensity inferior aspect left kidney. Question small embolic infarct. No adrenal mass. Foley catheter in place with decompressed urinary bladder with circumferential wall thickening. Stomach/Bowel: Narrowing of the mid to distal transverse colon may represent peristalsis although limiting evaluation for detection of underlying mass. Surgical clips right lower quadrant may indicate prior appendectomy. Upper abdominal 1.2 cm hernia through which knuckle of small bowel traverses but does not cause obstruction. No extraluminal bowel inflammatory process or free air Vascular/Lymphatic: Atherosclerotic changes aorta without aneurysm. Atherosclerotic changes aortic branch vessels with narrowing without large vessel occlusion. No worrisome pelvic or abdominal adenopathy Reproductive: No worrisome abnormality Other: Fat and vessel containing hernias Musculoskeletal: Degenerative changes lumbar spine. 1.1 cm lucency right L5 pedicle/ superior facet may represent result of  subchondral cystic changes given the adjacent facet degenerative changes. IMPRESSION: CHEST CT Interval development of pulmonary edema and small pleural effusions. Bibasilar consolidation suggestive of atelectasis versus infiltrates. Additionally, subtle patchy hazy parenchymal changes in the upper lung zones bilaterally raises possibly of multifocal pneumonia. Prominent 3 vessel coronary artery calcification. Aortic Atherosclerosis (ICD10-I70.0). Ascending thoracic aorta measures up to 4.2 cm. Recommend annual imaging followup by CTA or MRA. This recommendation follows 2010 ACCF/AHA/AATS/ACR/ASA/SCA/SCAI/SIR/STS/SVM Guidelines for the Diagnosis and Management of Patients with Thoracic Aortic Disease. Circulation. 2010; 121: [id number] Slightly enlarged calcified lymph node lower right paraesophageal region measuring up to 1.3 cm. Etiology/significance indeterminate. ABDOMINAL AND PELVIC CT Enlarged spleen spanning over  16 cm with peripheral wedge-shaped hypodensities raises possibility of septic embolic splenic infarcts. Small wedge-shaped hypodensity inferior aspect left kidney. Question small septic embolic infarct. Slightly enlarged minimally lobulated liver spanning over 19 cm. Cannot exclude changes of early cirrhosis. Narrowing of the mid to distal transverse colon may represent peristalsis although limiting evaluation for detection of underlying mass. Surgical clips right lower quadrant may indicate prior appendectomy. Upper abdominal 1.2 cm hernia through which knuckle of small bowel traverses but does not cause obstruction. No extraluminal bowel inflammatory process or free air. Aortic Atherosclerosis (ICD10-I70.0). 1.1 cm lucency right L5 pedicle/ superior facet may represent result of subchondral cystic changes given the adjacent facet degenerative changes. These results will be called to the ordering clinician or representative by the Radiologist Assistant, and communication documented in the PACS or  zVision Dashboard. Electronically Signed   By: Lacy Duverney M.D.   On: 08/01/2017 15:23   Ct Abdomen Pelvis W Contrast  Result Date: 08/01/2017 CLINICAL DATA:  56 year old male with altered mental status and methicillin-resistant Staph aureus bacteremia. COPD. Subsequent encounter. EXAM: CT CHEST, ABDOMEN, AND PELVIS WITH CONTRAST TECHNIQUE: Multidetector CT imaging of the chest, abdomen and pelvis was performed following the standard protocol during bolus administration of intravenous contrast. CONTRAST:  ISOVUE-300 IOPAMIDOL (ISOVUE-300) INJECTION 61% COMPARISON:  07/31/2017 chest x-ray. 07/20/2017 and chest CT. 08/10/2013 CT angiogram abdomen and pelvis. FINDINGS: CT CHEST FINDINGS Cardiovascular: Top-normal heart size. Prominent 3 vessel coronary artery calcification. Calcification thoracic aorta. Ascending thoracic aorta measures up to 4.2 cm. Exam not optimized to evaluate for pulmonary embolus. No large central pulmonary embolus. Mediastinum/Nodes: Slightly enlarged calcified lymph node lower right paraesophageal region measuring up to 1.3 cm. Lungs/Pleura: Interval development of pulmonary edema and small pleural effusions. Bibasilar consolidation suggestive of atelectasis versus infiltrates. Additionally, subtle patchy hazy parenchymal changes in the upper lung zones bilaterally raises possibly of multifocal pneumonia. Musculoskeletal: Remote left-sided rib fractures. CT ABDOMEN PELVIS FINDINGS Hepatobiliary: Slightly enlarged minimally lobulated liver spanning over 19 cm. Cannot exclude changes of early cirrhosis. No worrisome hepatic lesion. No worrisome focal hepatic lesion. Pancreas: No worrisome pancreatic mass or inflammation. Spleen: Enlarged spleen spanning over 16 cm with peripheral wedge-shaped hypodensities raises possibility of embolic splenic infarcts. Adrenals/Urinary Tract: No obstructing stone or hydronephrosis. Small wedge-shaped hypodensity inferior aspect left kidney. Question  small embolic infarct. No adrenal mass. Foley catheter in place with decompressed urinary bladder with circumferential wall thickening. Stomach/Bowel: Narrowing of the mid to distal transverse colon may represent peristalsis although limiting evaluation for detection of underlying mass. Surgical clips right lower quadrant may indicate prior appendectomy. Upper abdominal 1.2 cm hernia through which knuckle of small bowel traverses but does not cause obstruction. No extraluminal bowel inflammatory process or free air Vascular/Lymphatic: Atherosclerotic changes aorta without aneurysm. Atherosclerotic changes aortic branch vessels with narrowing without large vessel occlusion. No worrisome pelvic or abdominal adenopathy Reproductive: No worrisome abnormality Other: Fat and vessel containing hernias Musculoskeletal: Degenerative changes lumbar spine. 1.1 cm lucency right L5 pedicle/ superior facet may represent result of subchondral cystic changes given the adjacent facet degenerative changes. IMPRESSION: CHEST CT Interval development of pulmonary edema and small pleural effusions. Bibasilar consolidation suggestive of atelectasis versus infiltrates. Additionally, subtle patchy hazy parenchymal changes in the upper lung zones bilaterally raises possibly of multifocal pneumonia. Prominent 3 vessel coronary artery calcification. Aortic Atherosclerosis (ICD10-I70.0). Ascending thoracic aorta measures up to 4.2 cm. Recommend annual imaging followup by CTA or MRA. This recommendation follows 2010 ACCF/AHA/AATS/ACR/ASA/SCA/SCAI/SIR/STS/SVM Guidelines for the Diagnosis and  Management of Patients with Thoracic Aortic Disease. Circulation. 2010; 121: [id number] Slightly enlarged calcified lymph node lower right paraesophageal region measuring up to 1.3 cm. Etiology/significance indeterminate. ABDOMINAL AND PELVIC CT Enlarged spleen spanning over 16 cm with peripheral wedge-shaped hypodensities raises possibility of septic embolic  splenic infarcts. Small wedge-shaped hypodensity inferior aspect left kidney. Question small septic embolic infarct. Slightly enlarged minimally lobulated liver spanning over 19 cm. Cannot exclude changes of early cirrhosis. Narrowing of the mid to distal transverse colon may represent peristalsis although limiting evaluation for detection of underlying mass. Surgical clips right lower quadrant may indicate prior appendectomy. Upper abdominal 1.2 cm hernia through which knuckle of small bowel traverses but does not cause obstruction. No extraluminal bowel inflammatory process or free air. Aortic Atherosclerosis (ICD10-I70.0). 1.1 cm lucency right L5 pedicle/ superior facet may represent result of subchondral cystic changes given the adjacent facet degenerative changes. These results will be called to the ordering clinician or representative by the Radiologist Assistant, and communication documented in the PACS or zVision Dashboard. Electronically Signed   By: Lacy Duverney M.D.   On: 08/01/2017 15:23    Assessment/Plan: Diagnosis: functional and cognitive deficits secondary to septic emboli to the brain/debility 1. Does the need for close, 24 hr/day medical supervision in concert with the patient's rehab needs make it unreasonable for this patient to be served in a less intensive setting? Yes 2. Co-Morbidities requiring supervision/potential complications: etoh abuse, morbid obesity, endocarditis/MRSA bacteremia 3. Due to bladder management, bowel management, safety, skin/wound care, disease management, medication administration, pain management and patient education, does the patient require 24 hr/day rehab nursing? Yes 4. Does the patient require coordinated care of a physician, rehab nurse, PT (1-2 hrs/day, 5 days/week), OT (1-2 hrs/day, 5 days/week) and SLP (1-2 hrs/day, 5 days/week) to address physical and functional deficits in the context of the above medical diagnosis(es)? Yes Addressing deficits in  the following areas: balance, endurance, locomotion, strength, transferring, bowel/bladder control, bathing, dressing, feeding, grooming, toileting, cognition and psychosocial support 5. Can the patient actively participate in an intensive therapy program of at least 3 hrs of therapy per day at least 5 days per week? Yes 6. The potential for patient to make measurable gains while on inpatient rehab is good 7. Anticipated functional outcomes upon discharge from inpatient rehab are modified independent and supervision  with PT, modified independent and supervision with OT, modified independent and supervision with SLP. 8. Estimated rehab length of stay to reach the above functional goals is: 12-17 days 9. Anticipated D/C setting: Home 10. Anticipated post D/C treatments: HH therapy 11. Overall Rehab/Functional Prognosis: excellent  RECOMMENDATIONS: This patient's condition is appropriate for continued rehabilitative care in the following setting: CIR Patient has agreed to participate in recommended program. Yes Note that insurance prior authorization may be required for reimbursement for recommended care.  Comment: Rehab Admissions Coordinator to follow up.  Thanks,  Ranelle Oyster, MD, Earlie Counts, PA-C 08/02/2017

## 2017-08-02 NOTE — Progress Notes (Addendum)
INFECTIOUS DISEASE PROGRESS NOTE  ID: Reginald [name] is a 56 y.o. male with  Principal Problem:   MRSA bacteremia Active Problems:   Mitral valve vegetation   Acute respiratory failure (HCC)   Alcohol abuse   Thrombocytopenia (HCC)   Abnormal liver enzymes   Acute encephalopathy   Acute diastolic CHF (congestive heart failure) (HCC)   Sepsis (HCC)   Endocarditis of mitral valve   Cerebral septic emboli (HCC)  Subjective: No complaints.  Denies cough Denies suprapubic pain.   Abtx:  Anti-infectives (From admission, onward)   Start     Dose/Rate Route Frequency Ordered Stop   07/31/17 1400  DAPTOmycin (CUBICIN) 700 mg in sodium chloride 0.9 % IVPB     700 mg 228 mL/hr over 30 Minutes Intravenous Every 24 hours 07/31/17 1254     07/31/17 1330  ceftaroline (TEFLARO) 600 mg in sodium chloride 0.9 % 250 mL IVPB     600 mg 250 mL/hr over 60 Minutes Intravenous Every 12 hours 07/31/17 1240     07/30/17 1000  vancomycin (VANCOCIN) 1,750 mg in sodium chloride 0.9 % 500 mL IVPB  Status:  Discontinued     1,750 mg 250 mL/hr over 120 Minutes Intravenous Every 24 hours 07/30/17 0449 07/31/17 1238   07/29/17 1500  vancomycin (VANCOCIN) 1,500 mg in sodium chloride 0.9 % 500 mL IVPB     1,500 mg 250 mL/hr over 120 Minutes Intravenous Every 12 hours 07/29/17 0704 07/29/17 1818   07/26/17 1400  vancomycin (VANCOCIN) 1,500 mg in sodium chloride 0.9 % 500 mL IVPB  Status:  Discontinued     1,500 mg 250 mL/hr over 120 Minutes Intravenous Every 12 hours 07/26/17 1254 07/29/17 0247   07/25/17 2200  vancomycin (VANCOCIN) 2,500 mg in sodium chloride 0.9 % 500 mL IVPB  Status:  Discontinued     2,500 mg 250 mL/hr over 120 Minutes Intravenous  Once 07/25/17 2124 07/25/17 2210   07/25/17 2130  vancomycin (VANCOCIN) IVPB 1000 mg/200 mL premix  Status:  Discontinued     1,000 mg 200 mL/hr over 60 Minutes Intravenous  Once 07/25/17 2118 07/25/17 2123      Medications:  Scheduled: .  chlorhexidine  15 mL Mouth Rinse BID  . enoxaparin (LOVENOX) injection  50 mg Subcutaneous Q24H  . folic acid  1 mg Intravenous Daily  . furosemide  40 mg Intravenous Daily  . insulin aspart  0-20 Units Subcutaneous TID AC & HS  . mouth rinse  15 mL Mouth Rinse q12n4p  . metoprolol tartrate  5 mg Intravenous Q6H  . potassium chloride  20 mEq Oral Daily  . sodium chloride flush  3 mL Intravenous Q12H  . thiamine  100 mg Intravenous Daily   Or  . thiamine  100 mg Oral Daily    Objective: Vital signs in last 24 hours: Temp:  [98.3 F (36.8 C)-99.5 F (37.5 C)] 98.3 F (36.8 C) (12/12 0400) Pulse Rate:  [77-98] 95 (12/12 0740) Resp:  [19-32] 23 (12/12 0740) BP: (125-173)/(48-88) 147/83 (12/12 0740) SpO2:  [89 %-100 %] 96 % (12/12 0740) Weight:  [108.8 kg (239 lb 13.8 oz)] 108.8 kg (239 lb 13.8 oz) (12/12 0500)   General appearance: alert, cooperative and no distress Resp: clear to auscultation bilaterally Cardio: regular rate and rhythm GI: normal findings: bowel sounds normal and soft, non-tender  Lab Results Recent Labs    08/01/17 0344 08/02/17 0455 08/02/17 0710  WBC 10.2  --  11.2*  HGB 11.2*  --  11.1*  HCT 35.5*  --  35.3*  NA  --  140  --   K  --  3.6  --   CL  --  106  --   CO2  --  27  --   BUN  --  34*  --   CREATININE  --  0.83  --    Liver Panel No results for input(s): PROT, ALBUMIN, AST, ALT, ALKPHOS, BILITOT, BILIDIR, IBILI in the last 72 hours. Sedimentation Rate No results for input(s): ESRSEDRATE in the last 72 hours. C-Reactive Protein No results for input(s): CRP in the last 72 hours.  Microbiology: Recent Results (from the past 240 hour(s))  Culture, blood (x 2)     Status: None   Collection Time: 07/25/17  9:09 PM  Result Value Ref Range Status   Specimen Description BLOOD RIGHT HAND  Final   Special Requests   Final    BOTTLES DRAWN AEROBIC AND ANAEROBIC Blood Culture adequate volume   Culture   Final    NO GROWTH 5 DAYS Performed  at [facility], [address] [place], [place] [address]    Report Status 07/31/2017 FINAL  Final  Culture, blood (x 2)     Status: Abnormal   Collection Time: 07/25/17  9:14 PM  Result Value Ref Range Status   Specimen Description BLOOD RIGHT ANTECUBITAL  Final   Special Requests IN PEDIATRIC BOTTLE Blood Culture adequate volume  Final   Culture  Setup Time   Final    GRAM POSITIVE COCCI IN PEDIATRIC BOTTLE CRITICAL RESULT CALLED TO, READ BACK BY AND VERIFIED WITH: JESSE GRIMSLEY PHARMD AT 2225 ON [id number] BY SJW Performed at [facility], [address] [place], [place] [address]    Culture METHICILLIN RESISTANT STAPHYLOCOCCUS AUREUS (A)  Final   Report Status 07/28/2017 FINAL  Final   Organism ID, Bacteria METHICILLIN RESISTANT STAPHYLOCOCCUS AUREUS  Final      Susceptibility   Methicillin resistant staphylococcus aureus - MIC*    CIPROFLOXACIN >=8 RESISTANT Resistant     ERYTHROMYCIN >=8 RESISTANT Resistant     GENTAMICIN <=0.5 SENSITIVE Sensitive     OXACILLIN >=4 RESISTANT Resistant     TETRACYCLINE <=1 SENSITIVE Sensitive     VANCOMYCIN <=0.5 SENSITIVE Sensitive     TRIMETH/SULFA <=10 SENSITIVE Sensitive     CLINDAMYCIN <=0.25 SENSITIVE Sensitive     RIFAMPIN <=0.5 SENSITIVE Sensitive     Inducible Clindamycin NEGATIVE Sensitive     * METHICILLIN RESISTANT STAPHYLOCOCCUS AUREUS  Blood Culture ID Panel (Reflexed)     Status: Abnormal   Collection Time: 07/25/17  9:14 PM  Result Value Ref Range Status   Enterococcus species NOT DETECTED NOT DETECTED Final   Listeria monocytogenes NOT DETECTED NOT DETECTED Final   Staphylococcus species DETECTED (A) NOT DETECTED Final    Comment: CRITICAL RESULT CALLED TO, READ BACK BY AND VERIFIED WITH: JESSEE GRIMSLEY PHARMD AT 2225 ON [id number] BY SJW    Staphylococcus aureus DETECTED (A) NOT DETECTED Final    Comment: Methicillin (oxacillin)-resistant Staphylococcus aureus (MRSA). MRSA is predictably resistant to beta-lactam  antibiotics (except ceftaroline). Preferred therapy is vancomycin unless clinically contraindicated. Patient requires contact precautions if  hospitalized. CRITICAL RESULT CALLED TO, READ BACK BY AND VERIFIED WITH: JESSE GRIMSLEY PHARMD AT 2225 ON [id number] BY SJW    Methicillin resistance DETECTED (A) NOT DETECTED Final    Comment: CRITICAL RESULT CALLED TO, READ BACK BY AND VERIFIED WITH: JESSE GRIMSLEY PHARMD AT  2225 ON [id number] BY SJW    Streptococcus species NOT DETECTED NOT DETECTED Final   Streptococcus agalactiae NOT DETECTED NOT DETECTED Final   Streptococcus pneumoniae NOT DETECTED NOT DETECTED Final   Streptococcus pyogenes NOT DETECTED NOT DETECTED Final   Acinetobacter baumannii NOT DETECTED NOT DETECTED Final   Enterobacteriaceae species NOT DETECTED NOT DETECTED Final   Enterobacter cloacae complex NOT DETECTED NOT DETECTED Final   Escherichia coli NOT DETECTED NOT DETECTED Final   Klebsiella oxytoca NOT DETECTED NOT DETECTED Final   Klebsiella pneumoniae NOT DETECTED NOT DETECTED Final   Proteus species NOT DETECTED NOT DETECTED Final   Serratia marcescens NOT DETECTED NOT DETECTED Final   Haemophilus influenzae NOT DETECTED NOT DETECTED Final   Neisseria meningitidis NOT DETECTED NOT DETECTED Final   Pseudomonas aeruginosa NOT DETECTED NOT DETECTED Final   Candida albicans NOT DETECTED NOT DETECTED Final   Candida glabrata NOT DETECTED NOT DETECTED Final   Candida krusei NOT DETECTED NOT DETECTED Final   Candida parapsilosis NOT DETECTED NOT DETECTED Final   Candida tropicalis NOT DETECTED NOT DETECTED Final    Comment: Performed at [facility], [address] [place], [place] [address]  MRSA PCR Screening     Status: Abnormal   Collection Time: 07/27/17  6:20 PM  Result Value Ref Range Status   MRSA by PCR POSITIVE (A) NEGATIVE Final    Comment:        The GeneXpert MRSA Assay (FDA approved for NASAL specimens only), is one component of a comprehensive MRSA  colonization surveillance program. It is not intended to diagnose MRSA infection nor to guide or monitor treatment for MRSA infections. RESULT CALLED TO, READ BACK BY AND VERIFIED WITH: WOODY,J RN 12.6.18 @2014  ZANDO,C   Culture, blood (routine x 2)     Status: Abnormal   Collection Time: 07/28/17  7:44 AM  Result Value Ref Range Status   Specimen Description BLOOD LEFT HAND  Final   Special Requests IN PEDIATRIC BOTTLE Blood Culture adequate volume  Final   Culture  Setup Time   Final    GRAM POSITIVE COCCI IN CLUSTERS IN PEDIATRIC BOTTLE CRITICAL VALUE NOTED.  VALUE IS CONSISTENT WITH PREVIOUSLY REPORTED AND CALLED VALUE.    Culture (A)  Final    STAPHYLOCOCCUS AUREUS SUSCEPTIBILITIES PERFORMED ON PREVIOUS CULTURE WITHIN THE LAST 5 DAYS. Performed at [facility], [address] [place], [place] [address]    Report Status 07/30/2017 FINAL  Final  Culture, blood (routine x 2)     Status: Abnormal   Collection Time: 07/28/17  7:45 AM  Result Value Ref Range Status   Specimen Description BLOOD RIGHT HAND  Final   Special Requests IN PEDIATRIC BOTTLE Blood Culture adequate volume  Final   Culture  Setup Time   Final    GRAM POSITIVE COCCI IN CLUSTERS IN PEDIATRIC BOTTLE CRITICAL VALUE NOTED.  VALUE IS CONSISTENT WITH PREVIOUSLY REPORTED AND CALLED VALUE.    Culture (A)  Final    STAPHYLOCOCCUS AUREUS SUSCEPTIBILITIES PERFORMED ON PREVIOUS CULTURE WITHIN THE LAST 5 DAYS. Performed at [facility], [address], [place], [place] [address]    Report Status 07/30/2017 FINAL  Final  Culture, blood (Routine X 2) w Reflex to ID Panel     Status: None (Preliminary result)   Collection Time: 07/31/17  9:37 AM  Result Value Ref Range Status   Specimen Description BLOOD RIGHT FOREARM  Final   Special Requests   Final  BOTTLES DRAWN AEROBIC AND ANAEROBIC Blood Culture adequate volume   Culture  Setup Time   Final    GRAM POSITIVE COCCI IN CLUSTERS ANAEROBIC BOTTLE  ONLY CRITICAL VALUE NOTED.  VALUE IS CONSISTENT WITH PREVIOUSLY REPORTED AND CALLED VALUE.    Culture   Final    NO GROWTH 1 DAY Performed at [facility], [address] [place], [place] [address]    Report Status PENDING  Incomplete  Culture, blood (Routine X 2) w Reflex to ID Panel     Status: Abnormal (Preliminary result)   Collection Time: 07/31/17  9:37 AM  Result Value Ref Range Status   Specimen Description RIGHT ANTECUBITAL  Final   Special Requests IN PEDIATRIC BOTTLE Blood Culture adequate volume  Final   Culture  Setup Time   Final    GRAM POSITIVE COCCI IN CLUSTERS IN PEDIATRIC BOTTLE CRITICAL VALUE NOTED.  VALUE IS CONSISTENT WITH PREVIOUSLY REPORTED AND CALLED VALUE.    Culture (A)  Final    STAPHYLOCOCCUS AUREUS SUSCEPTIBILITIES PERFORMED ON PREVIOUS CULTURE WITHIN THE LAST 5 DAYS. Performed at [facility], [address], [place], [place] [address]    Report Status PENDING  Incomplete    Studies/Results: Dg Orthopantogram  Result Date: 08/02/2017 CLINICAL DATA:  Left maxillary pain for 2 weeks EXAM: ORTHOPANTOGRAM/PANORAMIC COMPARISON:  07/28/2017 FINDINGS: Multiple dental caries are noted throughout the mandible but primarily within a right molar. Multiple caries are noted within the residual teeth in the maxilla as well. No definitive changes of periapical abscess are seen. Mild mucosal thickening is noted within the left maxillary sinus similar to that noted on prior CT. IMPRESSION: Multiple dental caries.  No definitive periapical abscess is noted. Mucosal thickening within the left maxillary sinus. This could be related to the patient's underlying discomfort. Electronically Signed   By: Alcide Clever M.D.   On: 08/02/2017 08:45   Ct Chest W Contrast  Result Date: 08/01/2017 CLINICAL DATA:  56 year old male with altered mental status and methicillin-resistant Staph aureus bacteremia. COPD. Subsequent encounter. EXAM: CT CHEST, ABDOMEN, AND PELVIS  WITH CONTRAST TECHNIQUE: Multidetector CT imaging of the chest, abdomen and pelvis was performed following the standard protocol during bolus administration of intravenous contrast. CONTRAST:  ISOVUE-300 IOPAMIDOL (ISOVUE-300) INJECTION 61% COMPARISON:  07/31/2017 chest x-ray. 07/20/2017 and chest CT. 08/10/2013 CT angiogram abdomen and pelvis. FINDINGS: CT CHEST FINDINGS Cardiovascular: Top-normal heart size. Prominent 3 vessel coronary artery calcification. Calcification thoracic aorta. Ascending thoracic aorta measures up to 4.2 cm. Exam not optimized to evaluate for pulmonary embolus. No large central pulmonary embolus. Mediastinum/Nodes: Slightly enlarged calcified lymph node lower right paraesophageal region measuring up to 1.3 cm. Lungs/Pleura: Interval development of pulmonary edema and small pleural effusions. Bibasilar consolidation suggestive of atelectasis versus infiltrates. Additionally, subtle patchy hazy parenchymal changes in the upper lung zones bilaterally raises possibly of multifocal pneumonia. Musculoskeletal: Remote left-sided rib fractures. CT ABDOMEN PELVIS FINDINGS Hepatobiliary: Slightly enlarged minimally lobulated liver spanning over 19 cm. Cannot exclude changes of early cirrhosis. No worrisome hepatic lesion. No worrisome focal hepatic lesion. Pancreas: No worrisome pancreatic mass or inflammation. Spleen: Enlarged spleen spanning over 16 cm with peripheral wedge-shaped hypodensities raises possibility of embolic splenic infarcts. Adrenals/Urinary Tract: No obstructing stone or hydronephrosis. Small wedge-shaped hypodensity inferior aspect left kidney. Question small embolic infarct. No adrenal mass. Foley catheter in place with decompressed urinary bladder with circumferential wall thickening. Stomach/Bowel: Narrowing of the mid to distal transverse colon may represent peristalsis although limiting evaluation  for detection of underlying mass. Surgical clips right lower quadrant  may indicate prior appendectomy. Upper abdominal 1.2 cm hernia through which knuckle of small bowel traverses but does not cause obstruction. No extraluminal bowel inflammatory process or free air Vascular/Lymphatic: Atherosclerotic changes aorta without aneurysm. Atherosclerotic changes aortic branch vessels with narrowing without large vessel occlusion. No worrisome pelvic or abdominal adenopathy Reproductive: No worrisome abnormality Other: Fat and vessel containing hernias Musculoskeletal: Degenerative changes lumbar spine. 1.1 cm lucency right L5 pedicle/ superior facet may represent result of subchondral cystic changes given the adjacent facet degenerative changes. IMPRESSION: CHEST CT Interval development of pulmonary edema and small pleural effusions. Bibasilar consolidation suggestive of atelectasis versus infiltrates. Additionally, subtle patchy hazy parenchymal changes in the upper lung zones bilaterally raises possibly of multifocal pneumonia. Prominent 3 vessel coronary artery calcification. Aortic Atherosclerosis (ICD10-I70.0). Ascending thoracic aorta measures up to 4.2 cm. Recommend annual imaging followup by CTA or MRA. This recommendation follows 2010 ACCF/AHA/AATS/ACR/ASA/SCA/SCAI/SIR/STS/SVM Guidelines for the Diagnosis and Management of Patients with Thoracic Aortic Disease. Circulation. 2010; 121: Z610-R604 Slightly enlarged calcified lymph node lower right paraesophageal region measuring up to 1.3 cm. Etiology/significance indeterminate. ABDOMINAL AND PELVIC CT Enlarged spleen spanning over 16 cm with peripheral wedge-shaped hypodensities raises possibility of septic embolic splenic infarcts. Small wedge-shaped hypodensity inferior aspect left kidney. Question small septic embolic infarct. Slightly enlarged minimally lobulated liver spanning over 19 cm. Cannot exclude changes of early cirrhosis. Narrowing of the mid to distal transverse colon may represent peristalsis although limiting  evaluation for detection of underlying mass. Surgical clips right lower quadrant may indicate prior appendectomy. Upper abdominal 1.2 cm hernia through which knuckle of small bowel traverses but does not cause obstruction. No extraluminal bowel inflammatory process or free air. Aortic Atherosclerosis (ICD10-I70.0). 1.1 cm lucency right L5 pedicle/ superior facet may represent result of subchondral cystic changes given the adjacent facet degenerative changes. These results will be called to the ordering clinician or representative by the Radiologist Assistant, and communication documented in the PACS or zVision Dashboard. Electronically Signed   By: Lacy Duverney M.D.   On: 08/01/2017 15:23   Ct Abdomen Pelvis W Contrast  Result Date: 08/01/2017 CLINICAL DATA:  56 year old male with altered mental status and methicillin-resistant Staph aureus bacteremia. COPD. Subsequent encounter. EXAM: CT CHEST, ABDOMEN, AND PELVIS WITH CONTRAST TECHNIQUE: Multidetector CT imaging of the chest, abdomen and pelvis was performed following the standard protocol during bolus administration of intravenous contrast. CONTRAST:  ISOVUE-300 IOPAMIDOL (ISOVUE-300) INJECTION 61% COMPARISON:  07/31/2017 chest x-ray. 07/20/2017 and chest CT. 08/10/2013 CT angiogram abdomen and pelvis. FINDINGS: CT CHEST FINDINGS Cardiovascular: Top-normal heart size. Prominent 3 vessel coronary artery calcification. Calcification thoracic aorta. Ascending thoracic aorta measures up to 4.2 cm. Exam not optimized to evaluate for pulmonary embolus. No large central pulmonary embolus. Mediastinum/Nodes: Slightly enlarged calcified lymph node lower right paraesophageal region measuring up to 1.3 cm. Lungs/Pleura: Interval development of pulmonary edema and small pleural effusions. Bibasilar consolidation suggestive of atelectasis versus infiltrates. Additionally, subtle patchy hazy parenchymal changes in the upper lung zones bilaterally raises possibly of  multifocal pneumonia. Musculoskeletal: Remote left-sided rib fractures. CT ABDOMEN PELVIS FINDINGS Hepatobiliary: Slightly enlarged minimally lobulated liver spanning over 19 cm. Cannot exclude changes of early cirrhosis. No worrisome hepatic lesion. No worrisome focal hepatic lesion. Pancreas: No worrisome pancreatic mass or inflammation. Spleen: Enlarged spleen spanning over 16 cm with peripheral wedge-shaped hypodensities raises possibility of embolic splenic infarcts. Adrenals/Urinary Tract: No obstructing stone or hydronephrosis. Small wedge-shaped hypodensity inferior aspect  left kidney. Question small embolic infarct. No adrenal mass. Foley catheter in place with decompressed urinary bladder with circumferential wall thickening. Stomach/Bowel: Narrowing of the mid to distal transverse colon may represent peristalsis although limiting evaluation for detection of underlying mass. Surgical clips right lower quadrant may indicate prior appendectomy. Upper abdominal 1.2 cm hernia through which knuckle of small bowel traverses but does not cause obstruction. No extraluminal bowel inflammatory process or free air Vascular/Lymphatic: Atherosclerotic changes aorta without aneurysm. Atherosclerotic changes aortic branch vessels with narrowing without large vessel occlusion. No worrisome pelvic or abdominal adenopathy Reproductive: No worrisome abnormality Other: Fat and vessel containing hernias Musculoskeletal: Degenerative changes lumbar spine. 1.1 cm lucency right L5 pedicle/ superior facet may represent result of subchondral cystic changes given the adjacent facet degenerative changes. IMPRESSION: CHEST CT Interval development of pulmonary edema and small pleural effusions. Bibasilar consolidation suggestive of atelectasis versus infiltrates. Additionally, subtle patchy hazy parenchymal changes in the upper lung zones bilaterally raises possibly of multifocal pneumonia. Prominent 3 vessel coronary artery  calcification. Aortic Atherosclerosis (ICD10-I70.0). Ascending thoracic aorta measures up to 4.2 cm. Recommend annual imaging followup by CTA or MRA. This recommendation follows 2010 ACCF/AHA/AATS/ACR/ASA/SCA/SCAI/SIR/STS/SVM Guidelines for the Diagnosis and Management of Patients with Thoracic Aortic Disease. Circulation. 2010; 121: [id number] Slightly enlarged calcified lymph node lower right paraesophageal region measuring up to 1.3 cm. Etiology/significance indeterminate. ABDOMINAL AND PELVIC CT Enlarged spleen spanning over 16 cm with peripheral wedge-shaped hypodensities raises possibility of septic embolic splenic infarcts. Small wedge-shaped hypodensity inferior aspect left kidney. Question small septic embolic infarct. Slightly enlarged minimally lobulated liver spanning over 19 cm. Cannot exclude changes of early cirrhosis. Narrowing of the mid to distal transverse colon may represent peristalsis although limiting evaluation for detection of underlying mass. Surgical clips right lower quadrant may indicate prior appendectomy. Upper abdominal 1.2 cm hernia through which knuckle of small bowel traverses but does not cause obstruction. No extraluminal bowel inflammatory process or free air. Aortic Atherosclerosis (ICD10-I70.0). 1.1 cm lucency right L5 pedicle/ superior facet may represent result of subchondral cystic changes given the adjacent facet degenerative changes. These results will be called to the ordering clinician or representative by the Radiologist Assistant, and communication documented in the PACS or zVision Dashboard. Electronically Signed   By: Lacy Duverney M.D.   On: 08/01/2017 15:23     Assessment/Plan: MRSA bacteremia (BCx+ 12-4, 12-7, 12-10) MV endocarditis CNS, splenic emboli Possible multifocal pneumonia on CT Delirium  Total days of antibiotics: 8 ceftaroline/daptomycin  For cath today Possible MVR when BCx negative.  Will add rifampin (sfx explained to pt) Repeat BCx  in AM HIV and Hep C (-)         Reginald Sax MD, Reginald [name] Infectious Diseases (pager) [phone] [email or web address] 08/02/2017, 9:06 AM  LOS: 8 days

## 2017-08-02 NOTE — Progress Notes (Signed)
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 He remains bedridden and is non-ambulatory Patient's nutritional status appears to be poor. Recent CT scan shows probable embolic involvement to the spleen and kidney Probable right upper lobe pneumonia Patient remains with fairly normal white count and temperature curve Coronary angiograms are planned to define coronary anatomy in case the patient becomes a surgical candidate for mitral valve replacement Dental extraction of necrotic teeth is also planned in the near future Objective: Vital signs in last 24 hours: Temp:  [98.3 F (36.8 C)-99.5 F (37.5 C)] 98.3 F (36.8 C) (12/12 0400) Pulse Rate:  [77-97] 95 (12/12 0740) Cardiac Rhythm: Normal sinus rhythm;Heart block (12/12 0700) Resp:  [19-32] 23 (12/12 0740) BP: (125-155)/(48-86) 147/83 (12/12 0740) SpO2:  [89 %-100 %] 96 % (12/12 0740) Weight:  [239 lb 13.8 oz (108.8 kg)] 239 lb 13.8 oz (108.8 kg) (12/12 0500)  Hemodynamic parameters for last 24 hours:    Intake/Output from previous day: 12/11 0701 - 12/12 0700 In: 840 [P.O.:120; I.V.:220; IV Piggyback:500] Out: 2075 [Urine:2075] Intake/Output this shift: No intake/output data recorded.  Patient laying supine in no distress, chronically ill appearing Heart rate regular 2/6 systolic murmur No abdominal tenderness Patient responsive and appears appropriate  Lab Results: Recent Labs    08/01/17 0344 08/02/17 0710  WBC 10.2 11.2*  HGB 11.2* 11.1*  HCT 35.5* 35.3*  PLT PLATELET CLUMPS NOTED ON SMEAR, COUNT APPEARS ADEQUATE 351   BMET:  Recent Labs    08/02/17 0455  NA 140  K 3.6  CL 106  CO2 27  GLUCOSE 197*  BUN 34*  CREATININE 0.83  CALCIUM 9.0     PT/INR:  Recent Labs    08/02/17 0242  LABPROT 15.6*  INR 1.25   ABG    Component Value Date/Time   PHART 7.415 07/28/2017 0330   HCO3 30.9 (H) 07/28/2017 0330   O2SAT 98.0 07/28/2017 0330   CBG (last 3)  Recent Labs    08/01/17 2200 08/02/17 0946 08/02/17 1139  GLUCAP 210* 201* 182*    Assessment/Plan: S/P Procedure(s) (LRB): TRANSESOPHAGEAL ECHOCARDIOGRAM (TEE) (N/A) Recommend continuing forward with dental extractions Source of MRSA sepsis still undefined Patient currently not candidate for cardiac surgery because since blood cultures have not cleared, because he is bedridden and unable to walk, and will need PFTs and coronary angiograms performed. Continue medical therapy with antibiotics for endocarditis and diuretics and afterload reduction for mitral regurgitation  LOS: 8 days    Kathlee Nationseter Van Trigt III 08/02/2017

## 2017-08-02 NOTE — Progress Notes (Signed)
Nutrition Follow-up  DOCUMENTATION CODES:   Obesity unspecified  INTERVENTION:    Advance diet as medically appropriate  RD to add nutrition supplements when/as able  NEW NUTRITION DIAGNOSIS:   Increased nutrient needs related to chronic illness(COPD, CHF, alcoholism ) as evidenced by estimated needs, ongoing  GOAL:   Patient will meet greater than or equal to 90% of their needs, currently unmet  MONITOR:   Diet advancement, PO intake, Supplement acceptance, Weight trends, Labs  ASSESSMENT:   56 year old male with past medical history significant for COPD, DM, alcohol abuse, diastolic CHF, and AAA who was initially admitted to [facility] on 11/29 with nonspecific complaints of generalized weakness, low-grade fever, and muscle aches. 2D echocardiogram at that time demonstrated a mitral valve vegetation. He required ICU transfer during his [facility] for Precedex infusion with presumed alcohol withdrawal, was weaned off on 12/3.  The patient [facility] for infectious diseases consultation.  On 12/4 he was found to have a new facial droop.  MRI was done and demonstrated multiple infarcts to both cerebral hemispheres consistent with septic emboli.  12/5 late p.m. he developed agitation which is refractory to repeated doses of Ativan. He was started on Precedex infusion.   Pt extubated 12/10. Vital HP formula discontinued via OGT with extubation. S/p bedside swallow evaluation 12/11. Diet advanced to Regular, thin liquids.  TCTS note reviewed. Pt is not a candidate for surgery given immobility/bedridden state. Medications include vitamin B-1, Lasix and folic acid. Labs reviewed. CBG's 210-201-182.  Diet Order:  Diet NPO time specified Except for: Sips with Meds  EDUCATION NEEDS:   No education needs have been identified at this time  Skin:  Skin Assessment: Reviewed RN Assessment  Last BM:  12/12  Height:   Ht Readings from Last 1  Encounters:  08/01/17 5\' 9"  (1.753 m)   Weight:   Wt Readings from Last 1 Encounters:  08/02/17 239 lb 13.8 oz (108.8 kg)   Ideal Body Weight:  72.73 kg  BMI:  Body mass index is 35.42 kg/m.  Estimated Nutritional Needs:   Kcal:  2100-2300  Protein:  120-135 gm  Fluid:  2.1-2.3 L  Maureen ChattersKatie Bernadett Milian, RD, LDN Pager #: [phone] After-Hours Pager #: [phone]

## 2017-08-02 NOTE — Care Management Note (Addendum)
Case Management Note  Patient Details  Name: Reginald [name] MRN: [id number] Date of Birth: 05/10/1961  Subjective/Objective:                  [facility], [facility] and further treatment with HF, severe MR due to MRSA endocarditis, complicated by stroke;    Action/Plan: PTA Pt lived at home with spouse. PCP noted.  NCM will continue to follow for discharge needs.  Expected Discharge Date:                  Expected Discharge Plan:  IP Rehab Facility  In-House Referral:     Discharge planning Services  CM Consult  Post Acute Care Choice:    Choice offered to:     DME Arranged:    DME Agency:     HH Arranged:    HH Agency:     Status of Service:  In process, will continue to follow  If discussed at Long Length of Stay Meetings, dates discussed:    Additional Comments: 08/03/17 1050 Referral received for Lovenox needs. Transition plan CIR vs SNF.  NCM will follow re: Lovenox.  Yancey FlemingsKimberly R Georjean Toya, RN 08/02/2017, 4:54 PM

## 2017-08-02 NOTE — Progress Notes (Signed)
Site area: rt groin fv sheath pulled and pressure held by Army MeliaLaura Murphy Site Prior to Removal:  Level 0 Pressure Applied For:  15 minutes Manual:   yes Patient Status During Pull:  stable Post Pull Site:  Level  0 Post Pull Instructions Given:  yes Post Pull Pulses Present: palpable Dressing Applied:  Gauze and tegaderm Bedrest begins @  1705 Comments:

## 2017-08-02 NOTE — Progress Notes (Signed)
eLink Physician-Brief Progress Note Patient Name: Reginald [name] DOB: 12/21/1960 MRN: [id number]   Date of Service  08/02/2017  HPI/Events of Note  Anxiety  eICU Interventions  Will order:  1. Xanax 0.5 mg PO X 1 now.      Intervention Category Minor Interventions: Agitation / anxiety - evaluation and management  [name],Reginald Eugene 08/02/2017, 12:54 AM

## 2017-08-02 NOTE — Evaluation (Signed)
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 Pt developed agitation on 12/5 thought to be ETOH withdrawal. MRI on 12/5 done and showed multiple areas bilaterally of septic emboli. Pt with severe encephalopathy and respiratory failure and was intubated on 12/6. Pt extubated on 12/10. PMH - DM, ETOH, obesity  Clinical Impression  Pt admitted with above diagnosis and presents to PT with functional limitations due to deficits listed below (See PT problem list). Pt needs skilled PT to maximize independence and safety to allow discharge to CIR prior to return home. Pt with multiple medical problems which will slow his progress but expect he will progress with all mobility.     Follow Up Recommendations CIR    Equipment Recommendations  Rolling walker with 5" wheels    Recommendations for Other Services OT consult     Precautions / Restrictions Precautions Precautions: Fall Restrictions Weight Bearing Restrictions: No      Mobility  Bed Mobility Overal bed mobility: Needs Assistance Bed Mobility: Supine to Sit;Sit to Supine     Supine to sit: +2 for physical assistance;Mod assist Sit to supine: +2 for physical assistance;Min assist   General bed mobility comments: Assist to bring legs off of bed and elevate trunk into sitting. Assist to lower trunk and bring legs up into bed returning to supine.  Transfers Overall transfer level: Needs assistance Equipment used: 4-wheeled walker Transfers: Sit to/from Stand Sit to Stand: +2 physical assistance;Mod assist         General transfer comment: Assist to bring hips up and for balance. Verbal cues for hand placement.  Ambulation/Gait Ambulation/Gait assistance: +2 safety/equipment;Mod assist Ambulation Distance  (Feet): 3 Feet Assistive device: Rolling walker (2 wheeled) Gait Pattern/deviations: Step-through pattern;Decreased step length - right;Decreased step length - left;Shuffle;Trunk flexed Gait velocity: decr Gait velocity interpretation: Below normal speed for age/gender General Gait Details: Assist for balance and support.   Stairs            Wheelchair Mobility    Modified Rankin (Stroke Patients Only) Modified Rankin (Stroke Patients Only) Pre-Morbid Rankin Score: No symptoms Modified Rankin: Moderately severe disability     Balance Overall balance assessment: Needs assistance Sitting-balance support: No upper extremity supported;Feet supported Sitting balance-Leahy Scale: Fair     Standing balance support: Bilateral upper extremity supported Standing balance-Leahy Scale: Poor Standing balance comment: walker and min assist for static standing                             Pertinent Vitals/Pain Pain Assessment: Faces Faces Pain Scale: No hurt    Home Living Family/patient expects to be discharged to:: Private residence Living Arrangements: Spouse/significant other Available Help at Discharge: Family Type of Home: House Home Access: Level entry     Home Layout: One level Home Equipment: None      Prior Function Level of Independence: Independent               Hand Dominance        Extremity/Trunk Assessment   Upper Extremity Assessment Upper Extremity Assessment: Defer to OT evaluation    Lower Extremity Assessment Lower Extremity Assessment: Generalized weakness(no focal weakness noted)       Communication   Communication: No difficulties  Cognition Arousal/Alertness: Awake/alert(sleepy but easily  arousable) Behavior During Therapy: WFL for tasks assessed/performed Overall Cognitive Status: Impaired/Different from baseline Area of Impairment: Attention;Memory;Following commands;Safety/judgement;Problem solving                    Current Attention Level: Sustained Memory: Decreased short-term memory Following Commands: Follows one step commands consistently;Follows one step commands with increased time Safety/Judgement: Decreased awareness of safety;Decreased awareness of deficits   Problem Solving: Slow processing;Difficulty sequencing;Requires verbal cues;Requires tactile cues        General Comments      Exercises     Assessment/Plan    PT Assessment Patient needs continued PT services  PT Problem List Decreased strength;Decreased activity tolerance;Decreased balance;Decreased mobility;Decreased cognition;Decreased knowledge of use of DME;Decreased safety awareness;Obesity       PT Treatment Interventions DME instruction;Gait training;Functional mobility training;Therapeutic activities;Therapeutic exercise;Balance training;Neuromuscular re-education;Cognitive remediation;Patient/family education;Stair training    PT Goals (Current goals can be found in the Care Plan section)  Acute Rehab PT Goals Patient Stated Goal: Pt wants to go home PT Goal Formulation: With patient Time For Goal Achievement: 08/09/17 Potential to Achieve Goals: Good    Frequency Min 3X/week   Barriers to discharge        Co-evaluation               AM-PAC PT "6 Clicks" Daily Activity  Outcome Measure Difficulty turning over in bed (including adjusting bedclothes, sheets and blankets)?: Unable Difficulty moving from lying on back to sitting on the side of the bed? : Unable Difficulty sitting down on and standing up from a chair with arms (e.g., wheelchair, bedside commode, etc,.)?: Unable Help needed moving to and from a bed to chair (including a wheelchair)?: A Lot Help needed walking in hospital room?: A Lot Help needed climbing 3-5 steps with a railing? : Total 6 Click Score: 8    End of Session Equipment Utilized During Treatment: Gait belt Activity Tolerance: Patient limited by fatigue Patient  left: in bed;with call bell/phone within reach;with bed alarm set   PT Visit Diagnosis: Unsteadiness on feet (R26.81);Other abnormalities of gait and mobility (R26.89);Muscle weakness (generalized) (M62.81)    Time: [id number] PT Time Calculation (min) (ACUTE ONLY): 23 min   Charges:   PT Evaluation $PT Eval Moderate Complexity: 1 Mod PT Treatments $Gait Training: 8-22 mins   PT G Codes:        [facility] Leslee Haueter PT [phone]   Angelina OkCary [facility] 08/02/2017, 2:15 PM

## 2017-08-02 NOTE — Progress Notes (Signed)
Upon arrival to unit pt was A+Ox3 (disoriented to time only). As night progressed, pt unable to sleep and became increasing confused (oriented to self only, impulsive and attempting to get out of bed) presumably secondary to delirium. 1 time dose of Xanax ineffective in helping pt sleep. At shift change, pt found to be covered in stool and again very confused. Neurological status otherwise unchanged from previous assessment. Oncoming RN updated on pt's conditions.

## 2017-08-02 NOTE — Progress Notes (Signed)
Patient out of the room for cardiac cath. Will follow up in am to complete CIR consult.

## 2017-08-02 NOTE — Progress Notes (Signed)
Rehab Admissions Coordinator Note:  Patient was screened by Trish MageLogue, Vern Guerette M for appropriateness for an Inpatient Acute Rehab Consult.  At this time, we are recommending Inpatient Rehab consult.  Trish MageLogue, Breck [place] M 08/02/2017, 2:32 PM  I can be reached at [phone].

## 2017-08-02 NOTE — Interval H&P Note (Signed)
History and Physical Interval Note:  08/02/2017 3:20 PM  Reginald [name]  has presented today for cardiac cath with the diagnosis of mitral regurgitation, pre-op. The various methods of treatment have been discussed with the patient and family. After consideration of risks, benefits and other options for treatment, the patient has consented to  Procedure(s): RIGHT/LEFT HEART CATH AND CORONARY ANGIOGRAPHY (N/A) as a surgical intervention .  The patient's history has been reviewed, patient examined, no change in status, stable for surgery.  I have reviewed the patient's chart and labs.  Questions were answered to the patient's satisfaction.    Cath Lab Visit (complete for each Cath Lab visit)  Clinical Evaluation Leading to the Procedure:   ACS: No.  Non-ACS:    Anginal Classification: No Symptoms  Anti-ischemic medical therapy: No Therapy  Non-Invasive Test Results: No non-invasive testing performed  Prior CABG: No previous CABG         Verne Carrowhristopher McAlhany

## 2017-08-02 NOTE — Progress Notes (Signed)
[facility]  [facility] hospital infusion coordinator follow Mr. Mckinley JewelHazelwood with ID team to support long term IV ABX if needed at DC if home is the DC location.   If patient discharges after hours, please call [phone].   Reginald [name] 08/02/2017, 10:39 AM

## 2017-08-02 NOTE — H&P (View-Only) (Signed)
Progress Note  Patient Name: Reginald [name] Date of Encounter: 08/02/2017  Primary Cardiologist: New patient/ Dr Rennis Golden  Subjective   Feels better today, no chest pain or SOB.  Inpatient Medications    Scheduled Meds: . chlorhexidine  15 mL Mouth Rinse BID  . enoxaparin (LOVENOX) injection  50 mg Subcutaneous Q24H  . folic acid  1 mg Intravenous Daily  . furosemide  40 mg Intravenous Daily  . insulin aspart  0-20 Units Subcutaneous TID AC & HS  . mouth rinse  15 mL Mouth Rinse q12n4p  . metoprolol tartrate  5 mg Intravenous Q6H  . potassium chloride  20 mEq Oral Daily  . sodium chloride flush  3 mL Intravenous Q12H  . thiamine  100 mg Intravenous Daily   Or  . thiamine  100 mg Oral Daily   Continuous Infusions: . sodium chloride    . sodium chloride    . ceFTAROline (TEFLARO) IV Stopped (08/01/17 2330)  . DAPTOmycin (CUBICIN)  IV Stopped (07/31/17 1433)   PRN Meds: sodium chloride, acetaminophen (TYLENOL) oral liquid 160 mg/5 mL, hydrALAZINE, ipratropium-albuterol, [DISCONTINUED] ondansetron **OR** ondansetron (ZOFRAN) IV, sodium chloride flush   Vital Signs    Vitals:   08/02/17 0025 08/02/17 0400 08/02/17 0500 08/02/17 0740  BP: 137/78 (!) 145/76  (!) 147/83  Pulse: 80 97  95  Resp: (!) 31 (!) 28  (!) 23  Temp:  98.3 F (36.8 C)    TempSrc:  Oral    SpO2: 94% (!) 89%  96%  Weight:   239 lb 13.8 oz (108.8 kg)   Height:        Intake/Output Summary (Last 24 hours) at 08/02/2017 0901 Last data filed at 08/02/2017 0700 Gross per 24 hour  Intake 800 ml  Output 2075 ml  Net -1275 ml   Filed Weights   08/01/17 0326 08/01/17 2156 08/02/17 0500  Weight: 238 lb 12.1 oz (108.3 kg) 239 lb 13.8 oz (108.8 kg) 239 lb 13.8 oz (108.8 kg)    Telemetry    NSr - Personally Reviewed  ECG    No new tracing - Personally Reviewed  Physical Exam  In NAD GEN: No acute distress.   Neck: No JVD Cardiac: RRR, 2/6 holosystolic murmur at apex, no diastolic murmurs,  rubs, or gallops.  Respiratory: Crackles to auscultation bilaterally. GI: Soft, nontender, non-distended  MS: No edema; No deformity. Neuro:  Nonfocal  Psych: Normal affect   Labs    Chemistry Recent Labs  Lab 07/27/17 0322 07/28/17 0501 07/29/17 0233 07/30/17 0228 08/02/17 0455  NA 134* 137 140 143 140  K 4.2 4.0 4.1 4.1 3.6  CL 98* 101 104 106 106  CO2 30 30 31 31 27   GLUCOSE 200* 207* 202* 190* 197*  BUN 21* 38* 39* 42* 34*  CREATININE 0.61 0.81 0.69 0.79 0.83  CALCIUM 8.4* 8.4* 8.7* 9.0 9.0  PROT 6.7 6.8  --  7.9  --   ALBUMIN 2.0* 1.9*  --  2.1*  --   AST 40 30  --  33  --   ALT 50 39  --  38  --   ALKPHOS 217* 187*  --  207*  --   BILITOT 2.0* 1.7*  --  1.4*  --   GFRNONAA >60 >60 >60 >60 >60  GFRAA >60 >60 >60 >60 >60  ANIONGAP 6 6 5 6 7      Hematology Recent Labs  Lab 07/31/17 0324 08/01/17 0344 08/02/17 0710  WBC 10.9*  10.2 11.2*  RBC 3.23* 3.57* 3.57*  HGB 10.1* 11.2* 11.1*  HCT 32.3* 35.5* 35.3*  MCV 100.0 99.4 98.9  MCH 31.3 31.4 31.1  MCHC 31.3 31.5 31.4  RDW 14.2 14.2 14.2  PLT 252 PLATELET CLUMPS NOTED ON SMEAR, COUNT APPEARS ADEQUATE 351   Cardiac EnzymesNo results for input(s): TROPONINI in the last 168 hours. No results for input(s): TROPIPOC in the last 168 hours.   BNP No results for input(s): BNP, PROBNP in the last 168 hours.  DDimer No results for input(s): DDIMER in the last 168 hours.   Radiology    Dg Orthopantogram  Result Date: 08/02/2017 CLINICAL DATA:  Left maxillary pain for 2 weeks EXAM: ORTHOPANTOGRAM/PANORAMIC COMPARISON:  07/28/2017 FINDINGS: Multiple dental caries are noted throughout the mandible but primarily within a right molar. Multiple caries are noted within the residual teeth in the maxilla as well. No definitive changes of periapical abscess are seen. Mild mucosal thickening is noted within the left maxillary sinus similar to that noted on prior CT. IMPRESSION: Multiple dental caries.  No definitive periapical  abscess is noted. Mucosal thickening within the left maxillary sinus. This could be related to the patient's underlying discomfort. Electronically Signed   By: Alcide CleverMark  Lukens M.D.   On: 08/02/2017 08:45   Ct Chest W Contrast Ct Abdomen Pelvis W Contrast   IMPRESSION: CHEST CT Interval development of pulmonary edema and small pleural effusions. Bibasilar consolidation suggestive of atelectasis versus infiltrates. Additionally, subtle patchy hazy parenchymal changes in the upper lung zones bilaterally raises possibly of multifocal pneumonia. Prominent 3 vessel coronary artery calcification. Aortic Atherosclerosis (ICD10-I70.0). Ascending thoracic aorta measures up to 4.2 cm. Recommend annual imaging followup by CTA or MRA. This recommendation follows 2010 ACCF/AHA/AATS/ACR/ASA/SCA/SCAI/SIR/STS/SVM Guidelines for the Diagnosis and Management of Patients with Thoracic Aortic Disease. Circulation. 2010; 121: [id number] Slightly enlarged calcified lymph node lower right paraesophageal region measuring up to 1.3 cm. Etiology/significance indeterminate. ABDOMINAL AND PELVIC CT Enlarged spleen spanning over 16 cm with peripheral wedge-shaped hypodensities raises possibility of septic embolic splenic infarcts. Small wedge-shaped hypodensity inferior aspect left kidney. Question small septic embolic infarct. Slightly enlarged minimally lobulated liver spanning over 19 cm. Cannot exclude changes of early cirrhosis. Narrowing of the mid to distal transverse colon may represent peristalsis although limiting evaluation for detection of underlying mass. Surgical clips right lower quadrant may indicate prior appendectomy. Upper abdominal 1.2 cm hernia through which knuckle of small bowel traverses but does not cause obstruction. No extraluminal bowel inflammatory process or free air. Aortic Atherosclerosis (ICD10-I70.0). 1.1 cm lucency right L5 pedicle/ superior facet may represent result of subchondral cystic changes given the  adjacent facet degenerative changes. These results will be called to the ordering clinician or representative by the Radiologist Assistant, and communication documented in the PACS or zVision Dashboard. Electronically Signed   By: Lacy DuverneySteven  Olson M.D.   On: 08/01/2017 15:23   Cardiac Studies   07/27/2017 TEE  - Left ventricle: Normal LV size. Moderate LVH. Systolic function   was normal. The estimated ejection fraction was in the range of   60% to 65%. Wall motion was normal; there were no regional wall   motion abnormalities. - Aortic valve: Trileaflet. Small echogenic mass at the tip of the   non-coronary cusp. lambl&'s excrescences. Trivial AI. - Aorta: Grade 3 mobile atheroma of the proximal aortic arch. - Mitral valve: Large 2-3 cm mass of the A3 segment of the anterior   leaflet, prolapsing across the posterior leaflet. There is a  distinct posterior jet which is centrally directed of severe   regurgitation, suggestive of perforation. There is dropout seen   through the vegetation or in the area of the P3 scallop - not   entirely clear, but concerning for possible perforation. There is   vegetation to a lesser extent on the posterior leaflet tip.   Effective regurgitant orifice (PISA): 1.01 cm^2. Regurgitant   volume (PISA): 155 ml. - Left atrium: The atrium was dilated. No evidence of thrombus in   the atrial cavity or appendage. - Right atrium: No evidence of thrombus in the atrial cavity or   appendage. - Atrial septum: No defect or patent foramen ovale was identified. - Pulmonic valve: No evidence of vegetation.  Impressions: - Large mass of the A3 segment of the anterior mitral leaflet,   prolapsing over the posterior leaflet. Posteriorly located   central regurgitant jet suggestive of possible perforation - in   the region of the P3 segment. Endocarditis of the posterior   leaflet tip is noted. There is a possible small mass on the   non-coronary cusp of the aortic  valve with trivial AI - this   could be resolved endocarditis or sclerosis as well.    Patient Profile     56 y.o. 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  Assessment & Plan    1. ZOX:[id number]:diuresed well overnight, negative 1.3 L , extubated yesterday, significant crackles, I will increase lasix to 40 mg IV BID and increase tomorrow if needed, he is getting iodine contrast today. Crea is stable.   2. Severe MR: will eventually need MV replacement.BCx still positive 12/10, CT surgery until BCx are sterile.  3. 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 High risk for more strokes. Would proceed with surgery sooner rather than later, but need sterile BCx first. Still positive yesterday,  ID follows, vancomycin was switched to daptomycin, rifampin added today, he had a CT chest/abdomen/pelvis to look for another possible source yesterday with no significant findings. Plan to do perform a diagnostic left heart catheterization today.  4. Hypertension, tachycardia - start metoprolol 25 mg po BID.   For questions or updates, please contact [facility] Please consult [email or web address] for contact info under Cardiology/STEMI.     Signed, Tobias AlexanderKatarina Dianna Deshler, MD  08/02/2017, 9:01 AM

## 2017-08-02 NOTE — Progress Notes (Signed)
PROGRESS NOTE  Reginald [name] WJX:[id number] DOB: [birth year] DOA: 07/25/2017 PCP: Patient, No Pcp Per  HPI/Recap of past 3 hours:  56 year old male with past medical history significant for COPD, diabetes mellitus, alcohol abuse, diastolic CHF, and AAA who was initially admitted to [facility] on 07/20/17 with nonspecific complaints of generalized weakness, low-grade fever, and muscle aches.  Blood cultures done at admission grew out MRSA and he was started on vancomycin.  2D echocardiogram at that time demonstrated a mitral valve vegetation.  ICU transfer at [facility] for Precedex infusion with presumed alcohol withdrawal was weaned off on 12/3.  Transferred to [facility] hospital for infectious diseases consultation.  On 12/4 found to have a new facial droop.  MRI brain revealed multiple infarcts to both cerebral hemispheres consistent with septic emboli. Transferred to [facility] hospital to continue workup and treatment.  No acute events reported overnight. Pat seen and examined with his family members at his bedside. He is alert but confused.   Assessment/Plan: Principal Problem:   MRSA bacteremia Active Problems:   Mitral valve vegetation   Acute respiratory failure (HCC)   Alcohol abuse   Thrombocytopenia (HCC)   Abnormal liver enzymes   Acute encephalopathy   Acute diastolic CHF (congestive heart failure) (HCC)   Sepsis (HCC)   Endocarditis of mitral valve   Cerebral septic emboli (HCC)   Hypertensive heart disease with heart failure (HCC)  AMS, persistent possibly 2/2 to septic emboli -ID following -neurochecks  MRSA bacteremia  -blood cultures positive 07/25/17, 07/28/17, 07/31/17 -IV ceftarolin, IV daptomycin day #8 -repeat blood cx x2 -cbc am -ID following. We appreciate recommendations  MV endocarditis -ID, cardiology following -may require M valve replacement. Defer to cardiology -c/w IV antibiotics -right/left heart catrh planned today  08/02/17  CNS, septic emboli -management as stated above  Possible multifocal pneumonia on CT, poa -continue IV antibiotics  Poor dentition -dentist consulted -we appreciate recommendations  Chronic normocytic anemia -hg 11.2 baseline hg 11 -mcv 98 -no sign of overt bleeding -cbc am   Code Status: Full  Family Communication: Family members at bedside  Disposition Plan: will stay another midnight to continue workup and treatment with IV antibiotics   Consultants:  ID  cardiology  Procedures:  Possible right/left HC today 08/02/17  Antimicrobials:  IV vancomycin day #8  DVT prophylaxis:  sq lovenox 50 mg daily   Objective: Vitals:   08/02/17 0025 08/02/17 0400 08/02/17 0500 08/02/17 0740  BP: 137/78 (!) 145/76  (!) 147/83  Pulse: 80 97  95  Resp: (!) 31 (!) 28  (!) 23  Temp:  98.3 F (36.8 C)    TempSrc:  Oral    SpO2: 94% (!) 89%  96%  Weight:   108.8 kg (239 lb 13.8 oz)   Height:        Intake/Output Summary (Last 24 hours) at 08/02/2017 0954 Last data filed at 08/02/2017 0700 Gross per 24 hour  Intake 800 ml  Output 2075 ml  Net -1275 ml   Filed Weights   08/01/17 0326 08/01/17 2156 08/02/17 0500  Weight: 108.3 kg (238 lb 12.1 oz) 108.8 kg (239 lb 13.8 oz) 108.8 kg (239 lb 13.8 oz)    Exam:   General:  56 yo CM WD WN alert but confused  Cardiovascular: RRR no rubs or gallops  Respiratory: CTA no wheezes or rhonchi  Abdomen: soft NT ND NBS x4   Musculoskeletal: Moves all 4 extremities. Non focal  Skin: no noted open ulcerative lesions  Psychiatry: unable to assess due to altered mental status   Data Reviewed: CBC: Recent Labs  Lab 07/29/17 0233 07/30/17 0228 07/31/17 0324 08/01/17 0344 08/02/17 0710  WBC 10.4 10.7* 10.9* 10.2 11.2*  NEUTROABS  --  8.9* 9.1* 8.7* 9.9*  HGB 10.6* 10.2* 10.1* 11.2* 11.1*  HCT 32.6* 32.1* 32.3* 35.5* 35.3*  MCV 97.3 98.5 100.0 99.4 98.9  PLT 184 222 252 PLATELET CLUMPS NOTED ON SMEAR,  COUNT APPEARS ADEQUATE 351   Basic Metabolic Panel: Recent Labs  Lab 07/27/17 0322  07/28/17 0501 07/28/17 1728 07/29/17 0233 07/30/17 0228 07/31/17 0324 08/01/17 0344 08/02/17 0455  NA 134*  --  137  --  140 143  --   --  140  K 4.2  --  4.0  --  4.1 4.1  --   --  3.6  CL 98*  --  101  --  104 106  --   --  106  CO2 30  --  30  --  31 31  --   --  27  GLUCOSE 200*  --  207*  --  202* 190*  --   --  197*  BUN 21*  --  38*  --  39* 42*  --   --  34*  CREATININE 0.61  --  0.81  --  0.69 0.79  --   --  0.83  CALCIUM 8.4*  --  8.4*  --  8.7* 9.0  --   --  9.0  MG  --    < > 2.1 2.0  --  1.9 2.2 2.1  --   PHOS  --    < > 4.8* 4.6  --  4.2 4.5 3.5  --    < > = values in this interval not displayed.   GFR: Estimated Creatinine Clearance: 119.3 mL/min (by C-G formula based on SCr of 0.83 mg/dL). Liver Function Tests: Recent Labs  Lab 07/27/17 0322 07/28/17 0501 07/30/17 0228  AST 40 30 33  ALT 50 39 38  ALKPHOS 217* 187* 207*  BILITOT 2.0* 1.7* 1.4*  PROT 6.7 6.8 7.9  ALBUMIN 2.0* 1.9* 2.1*   No results for input(s): LIPASE, AMYLASE in the last 168 hours. No results for input(s): AMMONIA in the last 168 hours. Coagulation Profile: Recent Labs  Lab 07/30/17 0228 08/02/17 0242  INR 1.14 1.25   Cardiac Enzymes: Recent Labs  Lab 07/29/17 1237 07/31/17 0324  CKTOTAL 26* 24*  CKMB 1.6 0.6   BNP (last 3 results) No results for input(s): PROBNP in the last 8760 hours. HbA1C: No results for input(s): HGBA1C in the last 72 hours. CBG: Recent Labs  Lab 08/01/17 1216 08/01/17 1610 08/01/17 1950 08/01/17 2200 08/02/17 0946  GLUCAP 186* 168* 220* 210* 201*   Lipid Profile: No results for input(s): CHOL, HDL, LDLCALC, TRIG, CHOLHDL, LDLDIRECT in the last 72 hours. Thyroid Function Tests: No results for input(s): TSH, T4TOTAL, FREET4, T3FREE, THYROIDAB in the last 72 hours. Anemia Panel: No results for input(s): VITAMINB12, FOLATE, FERRITIN, TIBC, IRON, RETICCTPCT  in the last 72 hours. Urine analysis:    Component Value Date/Time   COLORURINE STRAW (A) 07/28/2017 1330   APPEARANCEUR CLEAR 07/28/2017 1330   LABSPEC 1.010 07/28/2017 1330   PHURINE 5.0 07/28/2017 1330   GLUCOSEU NEGATIVE 07/28/2017 1330   HGBUR NEGATIVE 07/28/2017 1330   BILIRUBINUR NEGATIVE 07/28/2017 1330   KETONESUR NEGATIVE 07/28/2017 1330   PROTEINUR NEGATIVE 07/28/2017 1330   NITRITE NEGATIVE 07/28/2017 1330  LEUKOCYTESUR NEGATIVE 07/28/2017 1330   Sepsis Labs: @LABRCNTIP (procalcitonin:4,lacticidven:4)  ) Recent Results (from the past 240 hour(s))  Culture, blood (x 2)     Status: None   Collection Time: 07/25/17  9:09 PM  Result Value Ref Range Status   Specimen Description BLOOD RIGHT HAND  Final   Special Requests   Final    BOTTLES DRAWN AEROBIC AND ANAEROBIC Blood Culture adequate volume   Culture   Final    NO GROWTH 5 DAYS Performed at [facility], [address], [place], [place] [address]    Report Status 07/31/2017 FINAL  Final  Culture, blood (x 2)     Status: Abnormal   Collection Time: 07/25/17  9:14 PM  Result Value Ref Range Status   Specimen Description BLOOD RIGHT ANTECUBITAL  Final   Special Requests IN PEDIATRIC BOTTLE Blood Culture adequate volume  Final   Culture  Setup Time   Final    GRAM POSITIVE COCCI IN PEDIATRIC BOTTLE CRITICAL RESULT CALLED TO, READ BACK BY AND VERIFIED WITH: JESSE GRIMSLEY PHARMD AT 2225 ON [id number] BY SJW Performed at [facility], [address] [place], [place] [address]    Culture METHICILLIN RESISTANT STAPHYLOCOCCUS AUREUS (A)  Final   Report Status 07/28/2017 FINAL  Final   Organism ID, Bacteria METHICILLIN RESISTANT STAPHYLOCOCCUS AUREUS  Final      Susceptibility   Methicillin resistant staphylococcus aureus - MIC*    CIPROFLOXACIN >=8 RESISTANT Resistant     ERYTHROMYCIN >=8 RESISTANT Resistant     GENTAMICIN <=0.5 SENSITIVE Sensitive     OXACILLIN >=4 RESISTANT Resistant     TETRACYCLINE  <=1 SENSITIVE Sensitive     VANCOMYCIN <=0.5 SENSITIVE Sensitive     TRIMETH/SULFA <=10 SENSITIVE Sensitive     CLINDAMYCIN <=0.25 SENSITIVE Sensitive     RIFAMPIN <=0.5 SENSITIVE Sensitive     Inducible Clindamycin NEGATIVE Sensitive     * METHICILLIN RESISTANT STAPHYLOCOCCUS AUREUS  Blood Culture ID Panel (Reflexed)     Status: Abnormal   Collection Time: 07/25/17  9:14 PM  Result Value Ref Range Status   Enterococcus species NOT DETECTED NOT DETECTED Final   Listeria monocytogenes NOT DETECTED NOT DETECTED Final   Staphylococcus species DETECTED (A) NOT DETECTED Final    Comment: CRITICAL RESULT CALLED TO, READ BACK BY AND VERIFIED WITH: JESSEE GRIMSLEY PHARMD AT 2225 ON [id number] BY SJW    Staphylococcus aureus DETECTED (A) NOT DETECTED Final    Comment: Methicillin (oxacillin)-resistant Staphylococcus aureus (MRSA). MRSA is predictably resistant to beta-lactam antibiotics (except ceftaroline). Preferred therapy is vancomycin unless clinically contraindicated. Patient requires contact precautions if  hospitalized. CRITICAL RESULT CALLED TO, READ BACK BY AND VERIFIED WITH: JESSE GRIMSLEY PHARMD AT 2225 ON [id number] BY SJW    Methicillin resistance DETECTED (A) NOT DETECTED Final    Comment: CRITICAL RESULT CALLED TO, READ BACK BY AND VERIFIED WITH: JESSE GRIMSLEY PHARMD AT 2225 ON [id number] BY SJW    Streptococcus species NOT DETECTED NOT DETECTED Final   Streptococcus agalactiae NOT DETECTED NOT DETECTED Final   Streptococcus pneumoniae NOT DETECTED NOT DETECTED Final   Streptococcus pyogenes NOT DETECTED NOT DETECTED Final   Acinetobacter baumannii NOT DETECTED NOT DETECTED Final   Enterobacteriaceae species NOT DETECTED NOT DETECTED Final   Enterobacter cloacae complex NOT DETECTED NOT DETECTED Final   Escherichia coli NOT DETECTED NOT DETECTED Final   Klebsiella oxytoca NOT DETECTED NOT DETECTED Final   Klebsiella pneumoniae NOT DETECTED NOT DETECTED Final  Proteus species NOT  DETECTED NOT DETECTED Final   Serratia marcescens NOT DETECTED NOT DETECTED Final   Haemophilus influenzae NOT DETECTED NOT DETECTED Final   Neisseria meningitidis NOT DETECTED NOT DETECTED Final   Pseudomonas aeruginosa NOT DETECTED NOT DETECTED Final   Candida albicans NOT DETECTED NOT DETECTED Final   Candida glabrata NOT DETECTED NOT DETECTED Final   Candida krusei NOT DETECTED NOT DETECTED Final   Candida parapsilosis NOT DETECTED NOT DETECTED Final   Candida tropicalis NOT DETECTED NOT DETECTED Final    Comment: Performed at [facility], [address], [place], [place] [address]  MRSA PCR Screening     Status: Abnormal   Collection Time: 07/27/17  6:20 PM  Result Value Ref Range Status   MRSA by PCR POSITIVE (A) NEGATIVE Final    Comment:        The GeneXpert MRSA Assay (FDA approved for NASAL specimens only), is one component of a comprehensive MRSA colonization surveillance program. It is not intended to diagnose MRSA infection nor to guide or monitor treatment for MRSA infections. RESULT CALLED TO, READ BACK BY AND VERIFIED WITH: WOODY,J RN 12.6.18 @2014  ZANDO,C   Culture, blood (routine x 2)     Status: Abnormal   Collection Time: 07/28/17  7:44 AM  Result Value Ref Range Status   Specimen Description BLOOD LEFT HAND  Final   Special Requests IN PEDIATRIC BOTTLE Blood Culture adequate volume  Final   Culture  Setup Time   Final    GRAM POSITIVE COCCI IN CLUSTERS IN PEDIATRIC BOTTLE CRITICAL VALUE NOTED.  VALUE IS CONSISTENT WITH PREVIOUSLY REPORTED AND CALLED VALUE.    Culture (A)  Final    STAPHYLOCOCCUS AUREUS SUSCEPTIBILITIES PERFORMED ON PREVIOUS CULTURE WITHIN THE LAST 5 DAYS. Performed at [facility], [address] [place], [place] [address]    Report Status 07/30/2017 FINAL  Final  Culture, blood (routine x 2)     Status: Abnormal   Collection Time: 07/28/17  7:45 AM  Result Value Ref Range Status   Specimen Description BLOOD RIGHT HAND   Final   Special Requests IN PEDIATRIC BOTTLE Blood Culture adequate volume  Final   Culture  Setup Time   Final    GRAM POSITIVE COCCI IN CLUSTERS IN PEDIATRIC BOTTLE CRITICAL VALUE NOTED.  VALUE IS CONSISTENT WITH PREVIOUSLY REPORTED AND CALLED VALUE.    Culture (A)  Final    STAPHYLOCOCCUS AUREUS SUSCEPTIBILITIES PERFORMED ON PREVIOUS CULTURE WITHIN THE LAST 5 DAYS. Performed at [facility], [address], [place], [place] [address]    Report Status 07/30/2017 FINAL  Final  Culture, blood (Routine X 2) w Reflex to ID Panel     Status: None (Preliminary result)   Collection Time: 07/31/17  9:37 AM  Result Value Ref Range Status   Specimen Description BLOOD RIGHT FOREARM  Final   Special Requests   Final    BOTTLES DRAWN AEROBIC AND ANAEROBIC Blood Culture adequate volume   Culture  Setup Time   Final    GRAM POSITIVE COCCI IN CLUSTERS ANAEROBIC BOTTLE ONLY CRITICAL VALUE NOTED.  VALUE IS CONSISTENT WITH PREVIOUSLY REPORTED AND CALLED VALUE.    Culture   Final    NO GROWTH 1 DAY Performed at [facility], [address], [place], [place] [address]    Report Status PENDING  Incomplete  Culture, blood (Routine X 2) w Reflex to ID Panel     Status: Abnormal (Preliminary  result)   Collection Time: 07/31/17  9:37 AM  Result Value Ref Range Status   Specimen Description RIGHT ANTECUBITAL  Final   Special Requests IN PEDIATRIC BOTTLE Blood Culture adequate volume  Final   Culture  Setup Time   Final    GRAM POSITIVE COCCI IN CLUSTERS IN PEDIATRIC BOTTLE CRITICAL VALUE NOTED.  VALUE IS CONSISTENT WITH PREVIOUSLY REPORTED AND CALLED VALUE.    Culture (A)  Final    STAPHYLOCOCCUS AUREUS SUSCEPTIBILITIES PERFORMED ON PREVIOUS CULTURE WITHIN THE LAST 5 DAYS. Performed at [facility], [address] [place], [place] [address]    Report Status PENDING  Incomplete      Studies: Dg Orthopantogram  Result Date: 08/02/2017 CLINICAL DATA:  Left maxillary pain  for 2 weeks EXAM: ORTHOPANTOGRAM/PANORAMIC COMPARISON:  07/28/2017 FINDINGS: Multiple dental caries are noted throughout the mandible but primarily within a right molar. Multiple caries are noted within the residual teeth in the maxilla as well. No definitive changes of periapical abscess are seen. Mild mucosal thickening is noted within the left maxillary sinus similar to that noted on prior CT. IMPRESSION: Multiple dental caries.  No definitive periapical abscess is noted. Mucosal thickening within the left maxillary sinus. This could be related to the patient's underlying discomfort. Electronically Signed   By: Alcide Clever M.D.   On: 08/02/2017 08:45   Ct Chest W Contrast  Result Date: 08/01/2017 CLINICAL DATA:  56 year old male with altered mental status and methicillin-resistant Staph aureus bacteremia. COPD. Subsequent encounter. EXAM: CT CHEST, ABDOMEN, AND PELVIS WITH CONTRAST TECHNIQUE: Multidetector CT imaging of the chest, abdomen and pelvis was performed following the standard protocol during bolus administration of intravenous contrast. CONTRAST:  ISOVUE-300 IOPAMIDOL (ISOVUE-300) INJECTION 61% COMPARISON:  07/31/2017 chest x-ray. 07/20/2017 and chest CT. 08/10/2013 CT angiogram abdomen and pelvis. FINDINGS: CT CHEST FINDINGS Cardiovascular: Top-normal heart size. Prominent 3 vessel coronary artery calcification. Calcification thoracic aorta. Ascending thoracic aorta measures up to 4.2 cm. Exam not optimized to evaluate for pulmonary embolus. No large central pulmonary embolus. Mediastinum/Nodes: Slightly enlarged calcified lymph node lower right paraesophageal region measuring up to 1.3 cm. Lungs/Pleura: Interval development of pulmonary edema and small pleural effusions. Bibasilar consolidation suggestive of atelectasis versus infiltrates. Additionally, subtle patchy hazy parenchymal changes in the upper lung zones bilaterally raises possibly of multifocal pneumonia. Musculoskeletal:  Remote left-sided rib fractures. CT ABDOMEN PELVIS FINDINGS Hepatobiliary: Slightly enlarged minimally lobulated liver spanning over 19 cm. Cannot exclude changes of early cirrhosis. No worrisome hepatic lesion. No worrisome focal hepatic lesion. Pancreas: No worrisome pancreatic mass or inflammation. Spleen: Enlarged spleen spanning over 16 cm with peripheral wedge-shaped hypodensities raises possibility of embolic splenic infarcts. Adrenals/Urinary Tract: No obstructing stone or hydronephrosis. Small wedge-shaped hypodensity inferior aspect left kidney. Question small embolic infarct. No adrenal mass. Foley catheter in place with decompressed urinary bladder with circumferential wall thickening. Stomach/Bowel: Narrowing of the mid to distal transverse colon may represent peristalsis although limiting evaluation for detection of underlying mass. Surgical clips right lower quadrant may indicate prior appendectomy. Upper abdominal 1.2 cm hernia through which knuckle of small bowel traverses but does not cause obstruction. No extraluminal bowel inflammatory process or free air Vascular/Lymphatic: Atherosclerotic changes aorta without aneurysm. Atherosclerotic changes aortic branch vessels with narrowing without large vessel occlusion. No worrisome pelvic or abdominal adenopathy Reproductive: No worrisome abnormality Other: Fat and vessel containing hernias Musculoskeletal: Degenerative changes lumbar spine. 1.1 cm lucency right L5 pedicle/ superior facet may represent result of subchondral cystic changes given  the adjacent facet degenerative changes. IMPRESSION: CHEST CT Interval development of pulmonary edema and small pleural effusions. Bibasilar consolidation suggestive of atelectasis versus infiltrates. Additionally, subtle patchy hazy parenchymal changes in the upper lung zones bilaterally raises possibly of multifocal pneumonia. Prominent 3 vessel coronary artery calcification. Aortic Atherosclerosis  (ICD10-I70.0). Ascending thoracic aorta measures up to 4.2 cm. Recommend annual imaging followup by CTA or MRA. This recommendation follows 2010 ACCF/AHA/AATS/ACR/ASA/SCA/SCAI/SIR/STS/SVM Guidelines for the Diagnosis and Management of Patients with Thoracic Aortic Disease. Circulation. 2010; 121: [id number] Slightly enlarged calcified lymph node lower right paraesophageal region measuring up to 1.3 cm. Etiology/significance indeterminate. ABDOMINAL AND PELVIC CT Enlarged spleen spanning over 16 cm with peripheral wedge-shaped hypodensities raises possibility of septic embolic splenic infarcts. Small wedge-shaped hypodensity inferior aspect left kidney. Question small septic embolic infarct. Slightly enlarged minimally lobulated liver spanning over 19 cm. Cannot exclude changes of early cirrhosis. Narrowing of the mid to distal transverse colon may represent peristalsis although limiting evaluation for detection of underlying mass. Surgical clips right lower quadrant may indicate prior appendectomy. Upper abdominal 1.2 cm hernia through which knuckle of small bowel traverses but does not cause obstruction. No extraluminal bowel inflammatory process or free air. Aortic Atherosclerosis (ICD10-I70.0). 1.1 cm lucency right L5 pedicle/ superior facet may represent result of subchondral cystic changes given the adjacent facet degenerative changes. These results will be called to the ordering clinician or representative by the Radiologist Assistant, and communication documented in the PACS or zVision Dashboard. Electronically Signed   By: Lacy DuverneySteven  Olson M.D.   On: 08/01/2017 15:23   Ct Abdomen Pelvis W Contrast  Result Date: 08/01/2017 CLINICAL DATA:  56 year old male with altered mental status and methicillin-resistant Staph aureus bacteremia. COPD. Subsequent encounter. EXAM: CT CHEST, ABDOMEN, AND PELVIS WITH CONTRAST TECHNIQUE: Multidetector CT imaging of the chest, abdomen and pelvis was performed following the  standard protocol during bolus administration of intravenous contrast. CONTRAST:  100mL ISOVUE-300 IOPAMIDOL (ISOVUE-300) INJECTION 61% COMPARISON:  07/31/2017 chest x-ray. 07/20/2017 and chest CT. 08/10/2013 CT angiogram abdomen and pelvis. FINDINGS: CT CHEST FINDINGS Cardiovascular: Top-normal heart size. Prominent 3 vessel coronary artery calcification. Calcification thoracic aorta. Ascending thoracic aorta measures up to 4.2 cm. Exam not optimized to evaluate for pulmonary embolus. No large central pulmonary embolus. Mediastinum/Nodes: Slightly enlarged calcified lymph node lower right paraesophageal region measuring up to 1.3 cm. Lungs/Pleura: Interval development of pulmonary edema and small pleural effusions. Bibasilar consolidation suggestive of atelectasis versus infiltrates. Additionally, subtle patchy hazy parenchymal changes in the upper lung zones bilaterally raises possibly of multifocal pneumonia. Musculoskeletal: Remote left-sided rib fractures. CT ABDOMEN PELVIS FINDINGS Hepatobiliary: Slightly enlarged minimally lobulated liver spanning over 19 cm. Cannot exclude changes of early cirrhosis. No worrisome hepatic lesion. No worrisome focal hepatic lesion. Pancreas: No worrisome pancreatic mass or inflammation. Spleen: Enlarged spleen spanning over 16 cm with peripheral wedge-shaped hypodensities raises possibility of embolic splenic infarcts. Adrenals/Urinary Tract: No obstructing stone or hydronephrosis. Small wedge-shaped hypodensity inferior aspect left kidney. Question small embolic infarct. No adrenal mass. Foley catheter in place with decompressed urinary bladder with circumferential wall thickening. Stomach/Bowel: Narrowing of the mid to distal transverse colon may represent peristalsis although limiting evaluation for detection of underlying mass. Surgical clips right lower quadrant may indicate prior appendectomy. Upper abdominal 1.2 cm hernia through which knuckle of small bowel traverses  but does not cause obstruction. No extraluminal bowel inflammatory process or free air Vascular/Lymphatic: Atherosclerotic changes aorta without aneurysm. Atherosclerotic changes aortic branch vessels with narrowing without large vessel occlusion. No  worrisome pelvic or abdominal adenopathy Reproductive: No worrisome abnormality Other: Fat and vessel containing hernias Musculoskeletal: Degenerative changes lumbar spine. 1.1 cm lucency right L5 pedicle/ superior facet may represent result of subchondral cystic changes given the adjacent facet degenerative changes. IMPRESSION: CHEST CT Interval development of pulmonary edema and small pleural effusions. Bibasilar consolidation suggestive of atelectasis versus infiltrates. Additionally, subtle patchy hazy parenchymal changes in the upper lung zones bilaterally raises possibly of multifocal pneumonia. Prominent 3 vessel coronary artery calcification. Aortic Atherosclerosis (ICD10-I70.0). Ascending thoracic aorta measures up to 4.2 cm. Recommend annual imaging followup by CTA or MRA. This recommendation follows 2010 ACCF/AHA/AATS/ACR/ASA/SCA/SCAI/SIR/STS/SVM Guidelines for the Diagnosis and Management of Patients with Thoracic Aortic Disease. Circulation. 2010; 121: [id number] Slightly enlarged calcified lymph node lower right paraesophageal region measuring up to 1.3 cm. Etiology/significance indeterminate. ABDOMINAL AND PELVIC CT Enlarged spleen spanning over 16 cm with peripheral wedge-shaped hypodensities raises possibility of septic embolic splenic infarcts. Small wedge-shaped hypodensity inferior aspect left kidney. Question small septic embolic infarct. Slightly enlarged minimally lobulated liver spanning over 19 cm. Cannot exclude changes of early cirrhosis. Narrowing of the mid to distal transverse colon may represent peristalsis although limiting evaluation for detection of underlying mass. Surgical clips right lower quadrant may indicate prior appendectomy.  Upper abdominal 1.2 cm hernia through which knuckle of small bowel traverses but does not cause obstruction. No extraluminal bowel inflammatory process or free air. Aortic Atherosclerosis (ICD10-I70.0). 1.1 cm lucency right L5 pedicle/ superior facet may represent result of subchondral cystic changes given the adjacent facet degenerative changes. These results will be called to the ordering clinician or representative by the Radiologist Assistant, and communication documented in the PACS or zVision Dashboard. Electronically Signed   By: Lacy Duverney M.D.   On: 08/01/2017 15:23    Scheduled Meds: . chlorhexidine  15 mL Mouth Rinse BID  . enoxaparin (LOVENOX) injection  50 mg Subcutaneous Q24H  . folic acid  1 mg Intravenous Daily  . furosemide  40 mg Intravenous BID  . insulin aspart  0-20 Units Subcutaneous TID AC & HS  . mouth rinse  15 mL Mouth Rinse q12n4p  . metoprolol tartrate  5 mg Intravenous Q6H  . potassium chloride  20 mEq Oral Daily  . rifampin  300 mg Oral Q12H  . sodium chloride flush  3 mL Intravenous Q12H  . thiamine  100 mg Intravenous Daily   Or  . thiamine  100 mg Oral Daily    Continuous Infusions: . sodium chloride    . sodium chloride 10 mL/hr at 08/02/17 0929  . ceFTAROline (TEFLARO) IV Stopped (08/01/17 2330)  . DAPTOmycin (CUBICIN)  IV Stopped (07/31/17 1433)     LOS: 8 days     Darlin Drop, MD [facility] Pager [phone]  If 7PM-7AM, please contact night-coverage [email or web address] Password [facility] 08/02/2017, 9:54 AM

## 2017-08-02 NOTE — Progress Notes (Signed)
Progress Note  Patient Name: Reginald [name] Date of Encounter: 08/02/2017  Primary Cardiologist: New patient/ Dr Rennis Golden  Subjective   Feels better today, no chest pain or SOB.  Inpatient Medications    Scheduled Meds: . chlorhexidine  15 mL Mouth Rinse BID  . enoxaparin (LOVENOX) injection  50 mg Subcutaneous Q24H  . folic acid  1 mg Intravenous Daily  . furosemide  40 mg Intravenous Daily  . insulin aspart  0-20 Units Subcutaneous TID AC & HS  . mouth rinse  15 mL Mouth Rinse q12n4p  . metoprolol tartrate  5 mg Intravenous Q6H  . potassium chloride  20 mEq Oral Daily  . sodium chloride flush  3 mL Intravenous Q12H  . thiamine  100 mg Intravenous Daily   Or  . thiamine  100 mg Oral Daily   Continuous Infusions: . sodium chloride    . sodium chloride    . ceFTAROline (TEFLARO) IV Stopped (08/01/17 2330)  . DAPTOmycin (CUBICIN)  IV Stopped (07/31/17 1433)   PRN Meds: sodium chloride, acetaminophen (TYLENOL) oral liquid 160 mg/5 mL, hydrALAZINE, ipratropium-albuterol, [DISCONTINUED] ondansetron **OR** ondansetron (ZOFRAN) IV, sodium chloride flush   Vital Signs    Vitals:   08/02/17 0025 08/02/17 0400 08/02/17 0500 08/02/17 0740  BP: 137/78 (!) 145/76  (!) 147/83  Pulse: 80 97  95  Resp: (!) 31 (!) 28  (!) 23  Temp:  98.3 F (36.8 C)    TempSrc:  Oral    SpO2: 94% (!) 89%  96%  Weight:   239 lb 13.8 oz (108.8 kg)   Height:        Intake/Output Summary (Last 24 hours) at 08/02/2017 0901 Last data filed at 08/02/2017 0700 Gross per 24 hour  Intake 800 ml  Output 2075 ml  Net -1275 ml   Filed Weights   08/01/17 0326 08/01/17 2156 08/02/17 0500  Weight: 238 lb 12.1 oz (108.3 kg) 239 lb 13.8 oz (108.8 kg) 239 lb 13.8 oz (108.8 kg)    Telemetry    NSr - Personally Reviewed  ECG    No new tracing - Personally Reviewed  Physical Exam  In NAD GEN: No acute distress.   Neck: No JVD Cardiac: RRR, 2/6 holosystolic murmur at apex, no diastolic murmurs,  rubs, or gallops.  Respiratory: Crackles to auscultation bilaterally. GI: Soft, nontender, non-distended  MS: No edema; No deformity. Neuro:  Nonfocal  Psych: Normal affect   Labs    Chemistry Recent Labs  Lab 07/27/17 0322 07/28/17 0501 07/29/17 0233 07/30/17 0228 08/02/17 0455  NA 134* 137 140 143 140  K 4.2 4.0 4.1 4.1 3.6  CL 98* 101 104 106 106  CO2 30 30 31 31 27   GLUCOSE 200* 207* 202* 190* 197*  BUN 21* 38* 39* 42* 34*  CREATININE 0.61 0.81 0.69 0.79 0.83  CALCIUM 8.4* 8.4* 8.7* 9.0 9.0  PROT 6.7 6.8  --  7.9  --   ALBUMIN 2.0* 1.9*  --  2.1*  --   AST 40 30  --  33  --   ALT 50 39  --  38  --   ALKPHOS 217* 187*  --  207*  --   BILITOT 2.0* 1.7*  --  1.4*  --   GFRNONAA >60 >60 >60 >60 >60  GFRAA >60 >60 >60 >60 >60  ANIONGAP 6 6 5 6 7      Hematology Recent Labs  Lab 07/31/17 0324 08/01/17 0344 08/02/17 0710  WBC 10.9*  10.2 11.2*  RBC 3.23* 3.57* 3.57*  HGB 10.1* 11.2* 11.1*  HCT 32.3* 35.5* 35.3*  MCV 100.0 99.4 98.9  MCH 31.3 31.4 31.1  MCHC 31.3 31.5 31.4  RDW 14.2 14.2 14.2  PLT 252 PLATELET CLUMPS NOTED ON SMEAR, COUNT APPEARS ADEQUATE 351   Cardiac EnzymesNo results for input(s): TROPONINI in the last 168 hours. No results for input(s): TROPIPOC in the last 168 hours.   BNP No results for input(s): BNP, PROBNP in the last 168 hours.  DDimer No results for input(s): DDIMER in the last 168 hours.   Radiology    Dg Orthopantogram  Result Date: 08/02/2017 CLINICAL DATA:  Left maxillary pain for 2 weeks EXAM: ORTHOPANTOGRAM/PANORAMIC COMPARISON:  07/28/2017 FINDINGS: Multiple dental caries are noted throughout the mandible but primarily within a right molar. Multiple caries are noted within the residual teeth in the maxilla as well. No definitive changes of periapical abscess are seen. Mild mucosal thickening is noted within the left maxillary sinus similar to that noted on prior CT. IMPRESSION: Multiple dental caries.  No definitive periapical  abscess is noted. Mucosal thickening within the left maxillary sinus. This could be related to the patient's underlying discomfort. Electronically Signed   By: Alcide CleverMark  Lukens M.D.   On: 08/02/2017 08:45   Ct Chest W Contrast Ct Abdomen Pelvis W Contrast   IMPRESSION: CHEST CT Interval development of pulmonary edema and small pleural effusions. Bibasilar consolidation suggestive of atelectasis versus infiltrates. Additionally, subtle patchy hazy parenchymal changes in the upper lung zones bilaterally raises possibly of multifocal pneumonia. Prominent 3 vessel coronary artery calcification. Aortic Atherosclerosis (ICD10-I70.0). Ascending thoracic aorta measures up to 4.2 cm. Recommend annual imaging followup by CTA or MRA. This recommendation follows 2010 ACCF/AHA/AATS/ACR/ASA/SCA/SCAI/SIR/STS/SVM Guidelines for the Diagnosis and Management of Patients with Thoracic Aortic Disease. Circulation. 2010; 121: [id number] Slightly enlarged calcified lymph node lower right paraesophageal region measuring up to 1.3 cm. Etiology/significance indeterminate. ABDOMINAL AND PELVIC CT Enlarged spleen spanning over 16 cm with peripheral wedge-shaped hypodensities raises possibility of septic embolic splenic infarcts. Small wedge-shaped hypodensity inferior aspect left kidney. Question small septic embolic infarct. Slightly enlarged minimally lobulated liver spanning over 19 cm. Cannot exclude changes of early cirrhosis. Narrowing of the mid to distal transverse colon may represent peristalsis although limiting evaluation for detection of underlying mass. Surgical clips right lower quadrant may indicate prior appendectomy. Upper abdominal 1.2 cm hernia through which knuckle of small bowel traverses but does not cause obstruction. No extraluminal bowel inflammatory process or free air. Aortic Atherosclerosis (ICD10-I70.0). 1.1 cm lucency right L5 pedicle/ superior facet may represent result of subchondral cystic changes given the  adjacent facet degenerative changes. These results will be called to the ordering clinician or representative by the Radiologist Assistant, and communication documented in the PACS or zVision Dashboard. Electronically Signed   By: Lacy DuverneySteven  Olson M.D.   On: 08/01/2017 15:23   Cardiac Studies   07/27/2017 TEE  - Left ventricle: Normal LV size. Moderate LVH. Systolic function   was normal. The estimated ejection fraction was in the range of   60% to 65%. Wall motion was normal; there were no regional wall   motion abnormalities. - Aortic valve: Trileaflet. Small echogenic mass at the tip of the   non-coronary cusp. lambl&'s excrescences. Trivial AI. - Aorta: Grade 3 mobile atheroma of the proximal aortic arch. - Mitral valve: Large 2-3 cm mass of the A3 segment of the anterior   leaflet, prolapsing across the posterior leaflet. There is a  distinct posterior jet which is centrally directed of severe   regurgitation, suggestive of perforation. There is dropout seen   through the vegetation or in the area of the P3 scallop - not   entirely clear, but concerning for possible perforation. There is   vegetation to a lesser extent on the posterior leaflet tip.   Effective regurgitant orifice (PISA): 1.01 cm^2. Regurgitant   volume (PISA): 155 ml. - Left atrium: The atrium was dilated. No evidence of thrombus in   the atrial cavity or appendage. - Right atrium: No evidence of thrombus in the atrial cavity or   appendage. - Atrial septum: No defect or patent foramen ovale was identified. - Pulmonic valve: No evidence of vegetation.  Impressions: - Large mass of the A3 segment of the anterior mitral leaflet,   prolapsing over the posterior leaflet. Posteriorly located   central regurgitant jet suggestive of possible perforation - in   the region of the P3 segment. Endocarditis of the posterior   leaflet tip is noted. There is a possible small mass on the   non-coronary cusp of the aortic  valve with trivial AI - this   could be resolved endocarditis or sclerosis as well.    Patient Profile     56 y.o. 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  Assessment & Plan    1. ZOX:[id number]:diuresed well overnight, negative 1.3 L , extubated yesterday, significant crackles, I will increase lasix to 40 mg IV BID and increase tomorrow if needed, he is getting iodine contrast today. Crea is stable.   2. Severe MR: will eventually need MV replacement.BCx still positive 12/10, CT surgery until BCx are sterile.  3. 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 High risk for more strokes. Would proceed with surgery sooner rather than later, but need sterile BCx first. Still positive yesterday,  ID follows, vancomycin was switched to daptomycin, rifampin added today, he had a CT chest/abdomen/pelvis to look for another possible source yesterday with no significant findings. Plan to do perform a diagnostic left heart catheterization today.  4. Hypertension, tachycardia - start metoprolol 25 mg po BID.   For questions or updates, please contact [facility] Please consult [email or web address] for contact info under Cardiology/STEMI.     Signed, Tobias AlexanderKatarina Sokha Craker, MD  08/02/2017, 9:01 AM

## 2017-08-03 ENCOUNTER — Encounter (HOSPITAL_COMMUNITY): Payer: Self-pay | Admitting: Cardiovascular Disease

## 2017-08-03 ENCOUNTER — Inpatient Hospital Stay (HOSPITAL_COMMUNITY): Payer: Medicaid Other

## 2017-08-03 DIAGNOSIS — R0902 Hypoxemia: Secondary | ICD-10-CM

## 2017-08-03 DIAGNOSIS — A409 Streptococcal sepsis, unspecified: Secondary | ICD-10-CM

## 2017-08-03 DIAGNOSIS — Z4659 Encounter for fitting and adjustment of other gastrointestinal appliance and device: Secondary | ICD-10-CM

## 2017-08-03 LAB — BASIC METABOLIC PANEL
ANION GAP: 5 (ref 5–15)
BUN: 31 mg/dL — ABNORMAL HIGH (ref 6–20)
CALCIUM: 8.6 mg/dL — AB (ref 8.9–10.3)
CO2: 28 mmol/L (ref 22–32)
Chloride: 106 mmol/L (ref 101–111)
Creatinine, Ser: 0.84 mg/dL (ref 0.61–1.24)
GFR calc non Af Amer: >60 mL/min (ref >60)
Glucose, Bld: 133 mg/dL — ABNORMAL HIGH (ref 65–99)
POTASSIUM: 3.6 mmol/L (ref 3.5–5.1)
Sodium: 139 mmol/L (ref 135–145)

## 2017-08-03 LAB — GLUCOSE, CAPILLARY
GLUCOSE-CAPILLARY: 190 mg/dL — AB (ref 65–99)
GLUCOSE-CAPILLARY: 147 mg/dL — AB (ref 65–99)
Glucose-Capillary: 208 mg/dL — ABNORMAL HIGH (ref 65–99)
Glucose-Capillary: 152 mg/dL — ABNORMAL HIGH (ref 65–99)

## 2017-08-03 LAB — HEPATIC FUNCTION PANEL
ALBUMIN: 1.9 g/dL — AB (ref 3.5–5.0)
ALT: 28 U/L (ref 17–63)
AST: 34 U/L (ref 15–41)
Alkaline Phosphatase: 147 U/L — ABNORMAL HIGH (ref 38–126)
BILIRUBIN INDIRECT: 1.1 mg/dL — AB (ref 0.3–0.9)
Bilirubin, Direct: 1.2 mg/dL — ABNORMAL HIGH (ref 0.1–0.5)
TOTAL PROTEIN: 7.8 g/dL (ref 6.5–8.1)
Total Bilirubin: 2.3 mg/dL — ABNORMAL HIGH (ref 0.3–1.2)

## 2017-08-03 LAB — CBC
HEMATOCRIT: 28.9 % — AB (ref 39.0–52.0)
HEMOGLOBIN: 9.1 g/dL — AB (ref 13.0–17.0)
MCH: 31 pg (ref 26.0–34.0)
MCHC: 31.5 g/dL (ref 30.0–36.0)
MCV: 98.3 fL (ref 78.0–100.0)
Platelets: 286 10*3/uL (ref 150–400)
RBC: 2.94 MIL/uL — AB (ref 4.22–5.81)
RDW: 13.9 % (ref 11.5–15.5)
WBC: 8.1 10*3/uL (ref 4.0–10.5)

## 2017-08-03 MED ORDER — ENOXAPARIN SODIUM 120 MG/0.8ML ~~LOC~~ SOLN: 1.0000 mg/kg | Freq: Two times a day (BID) | SUBCUTANEOUS | Status: DC

## 2017-08-03 MED ORDER — ENOXAPARIN SODIUM 60 MG/0.6ML ~~LOC~~ SOLN
50.0000 mg | Freq: Every day | SUBCUTANEOUS | Status: AC
  Administered 2017-08-03 – 2017-08-05 (×3): 50 mg via SUBCUTANEOUS
  Filled 2017-08-03 (×3): qty 0.5

## 2017-08-03 MED ORDER — ENOXAPARIN SODIUM 60 MG/0.6ML ~~LOC~~ SOLN
50.0000 mg | Freq: Every day | SUBCUTANEOUS | Status: DC
  Filled 2017-08-03: qty 0.5

## 2017-08-03 MED ORDER — ACETAMINOPHEN 325 MG PO TABS
650.0000 mg | ORAL_TABLET | Freq: Four times a day (QID) | ORAL | Status: DC | PRN
  Administered 2017-08-03 – 2017-08-06 (×10): 650 mg via ORAL
  Filled 2017-08-03 (×10): qty 2

## 2017-08-03 NOTE — Progress Notes (Signed)
PROGRESS NOTE  [name] Dalto ZOX:[id number] DOB: 02/02/1961 DOA: 07/25/2017 PCP: Patient, No Pcp Per  HPI/Recap of past 4424 hours:  56 year old male with past medical history significant for COPD, diabetes mellitus, alcohol abuse, diastolic CHF, and AAA who was initially admitted to [facility] on 07/20/17 with nonspecific complaints of generalized weakness, low-grade fever, and muscle aches.  Blood cultures done at admission grew out MRSA and he was started on vancomycin.  2D echocardiogram at that time demonstrated a mitral valve vegetation.  ICU transfer at [facility] for Precedex infusion with presumed alcohol withdrawal was weaned off on 12/3.  [facility] for infectious diseases consultation.  On 12/4 found to have a new facial droop.  MRI brain revealed multiple infarcts to both cerebral hemispheres consistent with septic emboli. Transferred to [facility] to continue workup and treatment.  No acute events reported overnight. Dental surgeon planning extraction of remaining teeth with alveoloplasty and pre-prosthetic surgery in the operating room with general anesthesia for Monday, 08/07/2017 at 7:30 AM.    Pt seen and examined at his bedside. He admits to chronic back pain. Lower back pain, dull, non radiating. On IV antibiotics for MRSA bacteremia, endocarditis. ID following.   Assessment/Plan: Principal Problem:   MRSA bacteremia Active Problems:   Mitral valve vegetation   Acute respiratory failure (HCC)   Alcohol abuse   Thrombocytopenia (HCC)   Abnormal liver enzymes   Acute encephalopathy   Acute diastolic CHF (congestive heart failure) (HCC)   Sepsis (HCC)   Endocarditis of mitral valve   Cerebral septic emboli (HCC)   Hypertensive heart disease with heart failure (HCC)   Endotracheal tube present   Acute respiratory disease  AMS, resolving, possibly 2/2 to septic emboli -ID following -neurochecks  MRSA bacteremia  -blood  cultures positive 07/25/17, 07/28/17, 07/31/17 -IV ceftarolin, IV daptomycin day #9 -repeat blood cx x2 (08/03/17) in process -ID following. We appreciate recommendations -afebrile no leukocytosis -cbc am  MV endocarditis -ID, cardiology following -may require M valve replacement. Defer to cardiology -c/w IV antibiotics -right/left heart cath 08/02/17 -TEE 07/27/17: Impressions: - Large mass of the A3 segment of the anterior mitral leaflet, prolapsing over the posterior leaflet. Posteriorly located central regurgitant jet suggestive of possible perforation - in the region of the P3 segment. Endocarditis of the posterior leaflet tip is noted. There is a possible small mass on the non-coronary cusp of the aortic valve with trivial AI - this could be resolved endocarditis or sclerosis as well.  CNS, septic emboli -management as stated above  Possible multifocal pneumonia on CT, poa -continue IV antibiotics  Poor dentition -dental surgeon following -planning extraction of remaining teeth with alveoloplasty and pre-prosthetic surgery in the operating room with general anesthesia for Monday, 08/07/2017 at 7:30 AM.   -we appreciate recommendations  Chronic normocytic anemia -hg 9 from 11.2 baseline hg 11 -mcv 98 -no sign of overt bleeding -cbc am   Code Status: Full  Family Communication: Family members at bedside  Disposition Plan: will stay another midnight to continue workup and treatment with IV antibiotics. Possible dental extraction of remaining teeth with alveoloplasty and pre-prosthetic surgery in the operating room with general anesthesia for Monday, 08/07/2017 at 7:30 AM.     Consultants:  ID  Cardiology  Dental surgery  Procedures:  Possible right/left [facility] 08/02/17  Antimicrobials:  IV vancomycin day #9  DVT prophylaxis:  sq lovenox 50 mg daily   Objective: Vitals:   08/02/17 2100 08/02/17 2300 08/03/17 0414  08/03/17 0415  BP: 119/65  123/77  124/62  Pulse: 85 80  (!) 49  Resp: (!) 26 (!) 28  14  Temp:  98.7 F (37.1 C) 98 F (36.7 C) 98 F (36.7 C)  TempSrc:  Oral Oral Oral  SpO2: 97% 96%  96%  Weight:    107.6 kg (237 lb 3.4 oz)  Height:        Intake/Output Summary (Last 24 hours) at 08/03/2017 1143 Last data filed at 08/03/2017 0445 Gross per 24 hour  Intake 0 ml  Output 2327 ml  Net -2327 ml   Filed Weights   08/01/17 2156 08/02/17 0500 08/03/17 0415  Weight: 108.8 kg (239 lb 13.8 oz) 108.8 kg (239 lb 13.8 oz) 107.6 kg (237 lb 3.4 oz)    Exam:   General:  56 yo CM WD WN alert but confused  Cardiovascular: RRR no rubs or gallops  Respiratory: CTA no wheezes or rhonchi  Abdomen: soft NT ND NBS x4   Musculoskeletal: Moves all 4 extremities. Non focal  Skin: no noted open ulcerative lesions  Psychiatry: unable to assess due to altered mental status   Data Reviewed: CBC: Recent Labs  Lab 07/30/17 0228 07/31/17 0324 08/01/17 0344 08/02/17 0710 08/03/17 0304  WBC 10.7* 10.9* 10.2 11.2* 8.1  NEUTROABS 8.9* 9.1* 8.7* 9.9*  --   HGB 10.2* 10.1* 11.2* 11.1* 9.1*  HCT 32.1* 32.3* 35.5* 35.3* 28.9*  MCV 98.5 100.0 99.4 98.9 98.3  PLT 222 252 PLATELET CLUMPS NOTED ON SMEAR, COUNT APPEARS ADEQUATE 351 286   Basic Metabolic Panel: Recent Labs  Lab 07/28/17 0501 07/28/17 1728 07/29/17 0233 07/30/17 0228 07/31/17 0324 08/01/17 0344 08/02/17 0455 08/03/17 0304  NA 137  --  140 143  --   --  140 139  K 4.0  --  4.1 4.1  --   --  3.6 3.6  CL 101  --  104 106  --   --  106 106  CO2 30  --  31 31  --   --  27 28  GLUCOSE 207*  --  202* 190*  --   --  197* 133*  BUN 38*  --  39* 42*  --   --  34* 31*  CREATININE 0.81  --  0.69 0.79  --   --  0.83 0.84  CALCIUM 8.4*  --  8.7* 9.0  --   --  9.0 8.6*  MG 2.1 2.0  --  1.9 2.2 2.1  --   --   PHOS 4.8* 4.6  --  4.2 4.5 3.5  --   --    GFR: Estimated Creatinine Clearance: 117.3 mL/min (by C-G formula based on SCr of 0.84 mg/dL). Liver  Function Tests: Recent Labs  Lab 07/28/17 0501 07/30/17 0228 08/03/17 0304  AST 30 33 34  ALT 39 38 28  ALKPHOS 187* 207* 147*  BILITOT 1.7* 1.4* 2.3*  PROT 6.8 7.9 7.8  ALBUMIN 1.9* 2.1* 1.9*   No results for input(s): LIPASE, AMYLASE in the last 168 hours. Recent Labs  Lab 08/02/17 1049  AMMONIA 101*   Coagulation Profile: Recent Labs  Lab 07/30/17 0228 08/02/17 0242  INR 1.14 1.25   Cardiac Enzymes: Recent Labs  Lab 07/29/17 1237 07/31/17 0324  CKTOTAL 26* 24*  CKMB 1.6 0.6   BNP (last 3 results) No results for input(s): PROBNP in the last 8760 hours. HbA1C: No results for input(s): HGBA1C in the last 72 hours. CBG: Recent Labs  Lab 08/02/17 1139 08/02/17 1652 08/02/17 1753 08/02/17 2145 08/03/17 0641  GLUCAP 182* 139* 147* 154* 208*   Lipid Profile: No results for input(s): CHOL, HDL, LDLCALC, TRIG, CHOLHDL, LDLDIRECT in the last 72 hours. Thyroid Function Tests: No results for input(s): TSH, T4TOTAL, FREET4, T3FREE, THYROIDAB in the last 72 hours. Anemia Panel: No results for input(s): VITAMINB12, FOLATE, FERRITIN, TIBC, IRON, RETICCTPCT in the last 72 hours. Urine analysis:    Component Value Date/Time   COLORURINE STRAW (A) 07/28/2017 1330   APPEARANCEUR CLEAR 07/28/2017 1330   LABSPEC 1.010 07/28/2017 1330   PHURINE 5.0 07/28/2017 1330   GLUCOSEU NEGATIVE 07/28/2017 1330   HGBUR NEGATIVE 07/28/2017 1330   BILIRUBINUR NEGATIVE 07/28/2017 1330   KETONESUR NEGATIVE 07/28/2017 1330   PROTEINUR NEGATIVE 07/28/2017 1330   NITRITE NEGATIVE 07/28/2017 1330   LEUKOCYTESUR NEGATIVE 07/28/2017 1330   Sepsis Labs: @LABRCNTIP (procalcitonin:4,lacticidven:4)  ) Recent Results (from the past 240 hour(s))  Culture, blood (x 2)     Status: None   Collection Time: 07/25/17  9:09 PM  Result Value Ref Range Status   Specimen Description BLOOD RIGHT HAND  Final   Special Requests   Final    BOTTLES DRAWN AEROBIC AND ANAEROBIC Blood Culture adequate  volume   Culture   Final    NO GROWTH 5 DAYS Performed at [facility], [address], [place], [place] [address]    Report Status 07/31/2017 FINAL  Final  Culture, blood (x 2)     Status: Abnormal   Collection Time: 07/25/17  9:14 PM  Result Value Ref Range Status   Specimen Description BLOOD RIGHT ANTECUBITAL  Final   Special Requests IN PEDIATRIC BOTTLE Blood Culture adequate volume  Final   Culture  Setup Time   Final    GRAM POSITIVE COCCI IN PEDIATRIC BOTTLE CRITICAL RESULT CALLED TO, READ BACK BY AND VERIFIED WITH: JESSE GRIMSLEY PHARMD AT 2225 ON [id number] BY SJW Performed at [facility], [address] [place], [place] [address]    Culture METHICILLIN RESISTANT STAPHYLOCOCCUS AUREUS (A)  Final   Report Status 07/28/2017 FINAL  Final   Organism ID, Bacteria METHICILLIN RESISTANT STAPHYLOCOCCUS AUREUS  Final      Susceptibility   Methicillin resistant staphylococcus aureus - MIC*    CIPROFLOXACIN >=8 RESISTANT Resistant     ERYTHROMYCIN >=8 RESISTANT Resistant     GENTAMICIN <=0.5 SENSITIVE Sensitive     OXACILLIN >=4 RESISTANT Resistant     TETRACYCLINE <=1 SENSITIVE Sensitive     VANCOMYCIN <=0.5 SENSITIVE Sensitive     TRIMETH/SULFA <=10 SENSITIVE Sensitive     CLINDAMYCIN <=0.25 SENSITIVE Sensitive     RIFAMPIN <=0.5 SENSITIVE Sensitive     Inducible Clindamycin NEGATIVE Sensitive     * METHICILLIN RESISTANT STAPHYLOCOCCUS AUREUS  Blood Culture ID Panel (Reflexed)     Status: Abnormal   Collection Time: 07/25/17  9:14 PM  Result Value Ref Range Status   Enterococcus species NOT DETECTED NOT DETECTED Final   Listeria monocytogenes NOT DETECTED NOT DETECTED Final   Staphylococcus species DETECTED (A) NOT DETECTED Final    Comment: CRITICAL RESULT CALLED TO, READ BACK BY AND VERIFIED WITH: JESSEE GRIMSLEY PHARMD AT 2225 ON [id number] BY SJW    Staphylococcus aureus DETECTED (A) NOT DETECTED Final    Comment: Methicillin (oxacillin)-resistant Staphylococcus  aureus (MRSA). MRSA is predictably resistant to beta-lactam antibiotics (except ceftaroline). Preferred therapy is vancomycin unless clinically contraindicated. Patient requires contact precautions if  hospitalized. CRITICAL RESULT CALLED  TO, READ BACK BY AND VERIFIED WITH: JESSE GRIMSLEY PHARMD AT 2225 ON [id number] BY SJW    Methicillin resistance DETECTED (A) NOT DETECTED Final    Comment: CRITICAL RESULT CALLED TO, READ BACK BY AND VERIFIED WITH: JESSE GRIMSLEY PHARMD AT 2225 ON [id number] BY SJW    Streptococcus species NOT DETECTED NOT DETECTED Final   Streptococcus agalactiae NOT DETECTED NOT DETECTED Final   Streptococcus pneumoniae NOT DETECTED NOT DETECTED Final   Streptococcus pyogenes NOT DETECTED NOT DETECTED Final   Acinetobacter baumannii NOT DETECTED NOT DETECTED Final   Enterobacteriaceae species NOT DETECTED NOT DETECTED Final   Enterobacter cloacae complex NOT DETECTED NOT DETECTED Final   Escherichia coli NOT DETECTED NOT DETECTED Final   Klebsiella oxytoca NOT DETECTED NOT DETECTED Final   Klebsiella pneumoniae NOT DETECTED NOT DETECTED Final   Proteus species NOT DETECTED NOT DETECTED Final   Serratia marcescens NOT DETECTED NOT DETECTED Final   Haemophilus influenzae NOT DETECTED NOT DETECTED Final   Neisseria meningitidis NOT DETECTED NOT DETECTED Final   Pseudomonas aeruginosa NOT DETECTED NOT DETECTED Final   Candida albicans NOT DETECTED NOT DETECTED Final   Candida glabrata NOT DETECTED NOT DETECTED Final   Candida krusei NOT DETECTED NOT DETECTED Final   Candida parapsilosis NOT DETECTED NOT DETECTED Final   Candida tropicalis NOT DETECTED NOT DETECTED Final    Comment: Performed at [facility], [address] [place], [place] [address]  MRSA PCR Screening     Status: Abnormal   Collection Time: 07/27/17  6:20 PM  Result Value Ref Range Status   MRSA by PCR POSITIVE (A) NEGATIVE Final    Comment:        The GeneXpert MRSA Assay (FDA approved for NASAL  specimens only), is one component of a comprehensive MRSA colonization surveillance program. It is not intended to diagnose MRSA infection nor to guide or monitor treatment for MRSA infections. RESULT CALLED TO, READ BACK BY AND VERIFIED WITH: WOODY,J RN 12.6.18 @2014  ZANDO,C   Culture, blood (routine x 2)     Status: Abnormal   Collection Time: 07/28/17  7:44 AM  Result Value Ref Range Status   Specimen Description BLOOD LEFT HAND  Final   Special Requests IN PEDIATRIC BOTTLE Blood Culture adequate volume  Final   Culture  Setup Time   Final    GRAM POSITIVE COCCI IN CLUSTERS IN PEDIATRIC BOTTLE CRITICAL VALUE NOTED.  VALUE IS CONSISTENT WITH PREVIOUSLY REPORTED AND CALLED VALUE.    Culture (A)  Final    STAPHYLOCOCCUS AUREUS SUSCEPTIBILITIES PERFORMED ON PREVIOUS CULTURE WITHIN THE LAST 5 DAYS. Performed at [facility], [address] [place], [place] [address]    Report Status 07/30/2017 FINAL  Final  Culture, blood (routine x 2)     Status: Abnormal   Collection Time: 07/28/17  7:45 AM  Result Value Ref Range Status   Specimen Description BLOOD RIGHT HAND  Final   Special Requests IN PEDIATRIC BOTTLE Blood Culture adequate volume  Final   Culture  Setup Time   Final    GRAM POSITIVE COCCI IN CLUSTERS IN PEDIATRIC BOTTLE CRITICAL VALUE NOTED.  VALUE IS CONSISTENT WITH PREVIOUSLY REPORTED AND CALLED VALUE.    Culture (A)  Final    STAPHYLOCOCCUS AUREUS SUSCEPTIBILITIES PERFORMED ON PREVIOUS CULTURE WITHIN THE LAST 5 DAYS. Performed at [facility], [address] [place], [place] [address]    Report Status 07/30/2017 FINAL  Final  Culture, blood (Routine X  2) w Reflex to ID Panel     Status: Abnormal (Preliminary result)   Collection Time: 07/31/17  9:37 AM  Result Value Ref Range Status   Specimen Description BLOOD RIGHT FOREARM  Final   Special Requests   Final    BOTTLES DRAWN AEROBIC AND ANAEROBIC Blood Culture adequate volume   Culture  Setup  Time   Final    GRAM POSITIVE COCCI IN CLUSTERS ANAEROBIC BOTTLE ONLY CRITICAL VALUE NOTED.  VALUE IS CONSISTENT WITH PREVIOUSLY REPORTED AND CALLED VALUE.    Culture (A)  Final    STAPHYLOCOCCUS AUREUS SUSCEPTIBILITIES TO FOLLOW Performed at [facility], [address] [place], [place] [address]    Report Status PENDING  Incomplete  Culture, blood (Routine X 2) w Reflex to ID Panel     Status: Abnormal (Preliminary result)   Collection Time: 07/31/17  9:37 AM  Result Value Ref Range Status   Specimen Description RIGHT ANTECUBITAL  Final   Special Requests IN PEDIATRIC BOTTLE Blood Culture adequate volume  Final   Culture  Setup Time   Final    GRAM POSITIVE COCCI IN CLUSTERS IN PEDIATRIC BOTTLE CRITICAL VALUE NOTED.  VALUE IS CONSISTENT WITH PREVIOUSLY REPORTED AND CALLED VALUE. Performed at [facility], [address], [place], [place] [address]    Culture STAPHYLOCOCCUS AUREUS (A)  Final   Report Status PENDING  Incomplete      Studies: No results found.  Scheduled Meds: . chlorhexidine  15 mL Mouth Rinse BID  . enoxaparin (LOVENOX) injection  50 mg Subcutaneous QAC breakfast  . folic acid  1 mg Intravenous Daily  . furosemide  40 mg Intravenous BID  . insulin aspart  0-20 Units Subcutaneous TID AC & HS  . mouth rinse  15 mL Mouth Rinse q12n4p  . metoprolol tartrate  25 mg Oral BID  . potassium chloride  20 mEq Oral Daily  . rifampin  300 mg Oral Q12H  . sodium chloride flush  3 mL Intravenous Q12H  . thiamine  100 mg Intravenous Daily   Or  . thiamine  100 mg Oral Daily    Continuous Infusions: . sodium chloride    . ceFTAROline (TEFLARO) IV 600 mg (08/03/17 1030)  . DAPTOmycin (CUBICIN)  IV Stopped (08/02/17 1814)     LOS: 9 days     Darlin Drop, MD [facility] Pager [phone]  If 7PM-7AM, please contact night-coverage [email or web address] Password [facility] 08/03/2017, 11:43 AM

## 2017-08-03 NOTE — Progress Notes (Signed)
OT Cancellation Note  Patient Details Name: Reginald [name] MRN: [id number] DOB: 11/17/1960   Cancelled Treatment:    Reason Eval/Treat Not Completed: Fatigue/lethargy limiting ability to participate. Pt requested OT return tomorrow; eyes closed  Galen ManilaSpencer, Keria Widrig Jeanette 08/03/2017, 2:11 PM

## 2017-08-03 NOTE — Progress Notes (Addendum)
SLP Cancellation Note  Patient Details Name: Reginald [name] MRN: [id number] DOB: 11/21/1960   Cancelled treatment:       Reason Eval/Treat Not Completed: Patient at procedure or test/unavailable. Will continue efforts.  Annia Gomm B. Murvin NatalBueche, [facility] Northwest[facility]P, CCC-SLP Speech Language Pathologist [phone]  [name], [name] 08/03/2017, 12:40 PM

## 2017-08-03 NOTE — Progress Notes (Addendum)
Pharmacy note: lovenox  Patient to continue lovenox for prophylaxis. He is noted for a dental procedure on 12/17. MD requests to stop lovenox the morning of 12/16.  Plan -Stop lovenox on on 08/06/17 -Will follow plans post -op  Harland Germanndrew [place] Oborn, Ilda Bassetharm D 08/03/2017 11:14 AM

## 2017-08-03 NOTE — Progress Notes (Signed)
INFECTIOUS DISEASE PROGRESS NOTE  ID: Reginald [name] is a 56 y.o. male with  Principal Problem:   MRSA bacteremia Active Problems:   Mitral valve vegetation   Acute respiratory failure (HCC)   Alcohol abuse   Thrombocytopenia (HCC)   Abnormal liver enzymes   Acute encephalopathy   Acute diastolic CHF (congestive heart failure) (HCC)   Sepsis (HCC)   Endocarditis of mitral valve   Cerebral septic emboli (HCC)   Hypertensive heart disease with heart failure (HCC)   Endotracheal tube present   Acute respiratory disease  Subjective: C/o pain previously, better now.  Per family, confused.   Abtx:  Anti-infectives (From admission, onward)   Start     Dose/Rate Route Frequency Ordered Stop   08/02/17 1400  DAPTOmycin (CUBICIN) 1,000 mg in sodium chloride 0.9 % IVPB     1,000 mg 240 mL/hr over 30 Minutes Intravenous Every 24 hours 08/02/17 1100     08/02/17 1000  rifampin (RIFADIN) capsule 300 mg     300 mg Oral Every 12 hours 08/02/17 0912     07/31/17 1400  DAPTOmycin (CUBICIN) 700 mg in sodium chloride 0.9 % IVPB  Status:  Discontinued     700 mg 228 mL/hr over 30 Minutes Intravenous Every 24 hours 07/31/17 1254 08/02/17 1100   07/31/17 1330  ceftaroline (TEFLARO) 600 mg in sodium chloride 0.9 % 250 mL IVPB     600 mg 250 mL/hr over 60 Minutes Intravenous Every 12 hours 07/31/17 1240     07/30/17 1000  vancomycin (VANCOCIN) 1,750 mg in sodium chloride 0.9 % 500 mL IVPB  Status:  Discontinued     1,750 mg 250 mL/hr over 120 Minutes Intravenous Every 24 hours 07/30/17 0449 07/31/17 1238   07/29/17 1500  vancomycin (VANCOCIN) 1,500 mg in sodium chloride 0.9 % 500 mL IVPB     1,500 mg 250 mL/hr over 120 Minutes Intravenous Every 12 hours 07/29/17 0704 07/29/17 1818   07/26/17 1400  vancomycin (VANCOCIN) 1,500 mg in sodium chloride 0.9 % 500 mL IVPB  Status:  Discontinued     1,500 mg 250 mL/hr over 120 Minutes Intravenous Every 12 hours 07/26/17 1254 07/29/17 0247   07/25/17 2200  vancomycin (VANCOCIN) 2,500 mg in sodium chloride 0.9 % 500 mL IVPB  Status:  Discontinued     2,500 mg 250 mL/hr over 120 Minutes Intravenous  Once 07/25/17 2124 07/25/17 2210   07/25/17 2130  vancomycin (VANCOCIN) IVPB 1000 mg/200 mL premix  Status:  Discontinued     1,000 mg 200 mL/hr over 60 Minutes Intravenous  Once 07/25/17 2118 07/25/17 2123      Medications:  Scheduled: . chlorhexidine  15 mL Mouth Rinse BID  . folic acid  1 mg Intravenous Daily  . furosemide  40 mg Intravenous BID  . insulin aspart  0-20 Units Subcutaneous TID AC & HS  . mouth rinse  15 mL Mouth Rinse q12n4p  . metoprolol tartrate  25 mg Oral BID  . potassium chloride  20 mEq Oral Daily  . rifampin  300 mg Oral Q12H  . sodium chloride flush  3 mL Intravenous Q12H  . thiamine  100 mg Intravenous Daily   Or  . thiamine  100 mg Oral Daily    Objective: Vital signs in last 24 hours: Temp:  [98 F (36.7 C)-98.7 F (37.1 C)] 98 F (36.7 C) (12/13 0415) Pulse Rate:  [0-94] 49 (12/13 0415) Resp:  [9-71] 14 (12/13 0415) BP: (119-144)/(62-89) 124/62 (12/13  0415) SpO2:  [0 %-99 %] 96 % (12/13 0415) Weight:  [107.6 kg (237 lb 3.4 oz)] 107.6 kg (237 lb 3.4 oz) (12/13 0415)   General appearance: alert, cooperative, no distress and asking about how to pay his bill Resp: clear to auscultation bilaterally Cardio: regular rate and rhythm and systolic murmur: early systolic 4/6, crescendo at 2nd left intercostal space, at 2nd right intercostal space GI: normal findings: bowel sounds normal and soft, non-tender  Lab Results Recent Labs    08/02/17 0455 08/02/17 0710 08/03/17 0304  WBC  --  11.2* 8.1  HGB  --  11.1* 9.1*  HCT  --  35.3* 28.9*  NA 140  --  139  K 3.6  --  3.6  CL 106  --  106  CO2 27  --  28  BUN 34*  --  31*  CREATININE 0.83  --  0.84   Liver Panel Recent Labs    08/03/17 0304  PROT 7.8  ALBUMIN 1.9*  AST 34  ALT 28  ALKPHOS 147*  BILITOT 2.3*  BILIDIR 1.2*    IBILI 1.1*   Sedimentation Rate No results for input(s): ESRSEDRATE in the last 72 hours. C-Reactive Protein No results for input(s): CRP in the last 72 hours.  Microbiology: Recent Results (from the past 240 hour(s))  Culture, blood (x 2)     Status: None   Collection Time: 07/25/17  9:09 PM  Result Value Ref Range Status   Specimen Description BLOOD RIGHT HAND  Final   Special Requests   Final    BOTTLES DRAWN AEROBIC AND ANAEROBIC Blood Culture adequate volume   Culture   Final    NO GROWTH 5 DAYS Performed at [facility], [address] [place], [place] [address]    Report Status 07/31/2017 FINAL  Final  Culture, blood (x 2)     Status: Abnormal   Collection Time: 07/25/17  9:14 PM  Result Value Ref Range Status   Specimen Description BLOOD RIGHT ANTECUBITAL  Final   Special Requests IN PEDIATRIC BOTTLE Blood Culture adequate volume  Final   Culture  Setup Time   Final    GRAM POSITIVE COCCI IN PEDIATRIC BOTTLE CRITICAL RESULT CALLED TO, READ BACK BY AND VERIFIED WITH: JESSE GRIMSLEY PHARMD AT 2225 ON [id number] BY SJW Performed at [facility], [address], [place], [place] [address]    Culture METHICILLIN RESISTANT STAPHYLOCOCCUS AUREUS (A)  Final   Report Status 07/28/2017 FINAL  Final   Organism ID, Bacteria METHICILLIN RESISTANT STAPHYLOCOCCUS AUREUS  Final      Susceptibility   Methicillin resistant staphylococcus aureus - MIC*    CIPROFLOXACIN >=8 RESISTANT Resistant     ERYTHROMYCIN >=8 RESISTANT Resistant     GENTAMICIN <=0.5 SENSITIVE Sensitive     OXACILLIN >=4 RESISTANT Resistant     TETRACYCLINE <=1 SENSITIVE Sensitive     VANCOMYCIN <=0.5 SENSITIVE Sensitive     TRIMETH/SULFA <=10 SENSITIVE Sensitive     CLINDAMYCIN <=0.25 SENSITIVE Sensitive     RIFAMPIN <=0.5 SENSITIVE Sensitive     Inducible Clindamycin NEGATIVE Sensitive     * METHICILLIN RESISTANT STAPHYLOCOCCUS AUREUS  Blood Culture ID Panel (Reflexed)     Status: Abnormal    Collection Time: 07/25/17  9:14 PM  Result Value Ref Range Status   Enterococcus species NOT DETECTED NOT DETECTED Final   Listeria monocytogenes NOT DETECTED NOT DETECTED Final   Staphylococcus species DETECTED (A) NOT DETECTED Final  Comment: CRITICAL RESULT CALLED TO, READ BACK BY AND VERIFIED WITH: JESSEE GRIMSLEY PHARMD AT 2225 ON [id number] BY SJW    Staphylococcus aureus DETECTED (A) NOT DETECTED Final    Comment: Methicillin (oxacillin)-resistant Staphylococcus aureus (MRSA). MRSA is predictably resistant to beta-lactam antibiotics (except ceftaroline). Preferred therapy is vancomycin unless clinically contraindicated. Patient requires contact precautions if  hospitalized. CRITICAL RESULT CALLED TO, READ BACK BY AND VERIFIED WITH: JESSE GRIMSLEY PHARMD AT 2225 ON [id number] BY SJW    Methicillin resistance DETECTED (A) NOT DETECTED Final    Comment: CRITICAL RESULT CALLED TO, READ BACK BY AND VERIFIED WITH: JESSE GRIMSLEY PHARMD AT 2225 ON [id number] BY SJW    Streptococcus species NOT DETECTED NOT DETECTED Final   Streptococcus agalactiae NOT DETECTED NOT DETECTED Final   Streptococcus pneumoniae NOT DETECTED NOT DETECTED Final   Streptococcus pyogenes NOT DETECTED NOT DETECTED Final   Acinetobacter baumannii NOT DETECTED NOT DETECTED Final   Enterobacteriaceae species NOT DETECTED NOT DETECTED Final   Enterobacter cloacae complex NOT DETECTED NOT DETECTED Final   Escherichia coli NOT DETECTED NOT DETECTED Final   Klebsiella oxytoca NOT DETECTED NOT DETECTED Final   Klebsiella pneumoniae NOT DETECTED NOT DETECTED Final   Proteus species NOT DETECTED NOT DETECTED Final   Serratia marcescens NOT DETECTED NOT DETECTED Final   Haemophilus influenzae NOT DETECTED NOT DETECTED Final   Neisseria meningitidis NOT DETECTED NOT DETECTED Final   Pseudomonas aeruginosa NOT DETECTED NOT DETECTED Final   Candida albicans NOT DETECTED NOT DETECTED Final   Candida glabrata NOT DETECTED NOT DETECTED  Final   Candida krusei NOT DETECTED NOT DETECTED Final   Candida parapsilosis NOT DETECTED NOT DETECTED Final   Candida tropicalis NOT DETECTED NOT DETECTED Final    Comment: Performed at [facility], [address] [place], [place] [address]  MRSA PCR Screening     Status: Abnormal   Collection Time: 07/27/17  6:20 PM  Result Value Ref Range Status   MRSA by PCR POSITIVE (A) NEGATIVE Final    Comment:        The GeneXpert MRSA Assay (FDA approved for NASAL specimens only), is one component of a comprehensive MRSA colonization surveillance program. It is not intended to diagnose MRSA infection nor to guide or monitor treatment for MRSA infections. RESULT CALLED TO, READ BACK BY AND VERIFIED WITH: WOODY,J RN 12.6.18 @2014  ZANDO,C   Culture, blood (routine x 2)     Status: Abnormal   Collection Time: 07/28/17  7:44 AM  Result Value Ref Range Status   Specimen Description BLOOD LEFT HAND  Final   Special Requests IN PEDIATRIC BOTTLE Blood Culture adequate volume  Final   Culture  Setup Time   Final    GRAM POSITIVE COCCI IN CLUSTERS IN PEDIATRIC BOTTLE CRITICAL VALUE NOTED.  VALUE IS CONSISTENT WITH PREVIOUSLY REPORTED AND CALLED VALUE.    Culture (A)  Final    STAPHYLOCOCCUS AUREUS SUSCEPTIBILITIES PERFORMED ON PREVIOUS CULTURE WITHIN THE LAST 5 DAYS. Performed at [facility], [address] [place], [place] [address]    Report Status 07/30/2017 FINAL  Final  Culture, blood (routine x 2)     Status: Abnormal   Collection Time: 07/28/17  7:45 AM  Result Value Ref Range Status   Specimen Description BLOOD RIGHT HAND  Final   Special Requests IN PEDIATRIC BOTTLE Blood Culture adequate volume  Final   Culture  Setup Time   Final    GRAM POSITIVE COCCI IN CLUSTERS  IN PEDIATRIC BOTTLE CRITICAL VALUE NOTED.  VALUE IS CONSISTENT WITH PREVIOUSLY REPORTED AND CALLED VALUE.    Culture (A)  Final    STAPHYLOCOCCUS AUREUS SUSCEPTIBILITIES PERFORMED ON PREVIOUS  CULTURE WITHIN THE LAST 5 DAYS. Performed at [facility], [address] [place], [place] [address]    Report Status 07/30/2017 FINAL  Final  Culture, blood (Routine X 2) w Reflex to ID Panel     Status: Abnormal (Preliminary result)   Collection Time: 07/31/17  9:37 AM  Result Value Ref Range Status   Specimen Description BLOOD RIGHT FOREARM  Final   Special Requests   Final    BOTTLES DRAWN AEROBIC AND ANAEROBIC Blood Culture adequate volume   Culture  Setup Time   Final    GRAM POSITIVE COCCI IN CLUSTERS ANAEROBIC BOTTLE ONLY CRITICAL VALUE NOTED.  VALUE IS CONSISTENT WITH PREVIOUSLY REPORTED AND CALLED VALUE.    Culture (A)  Final    STAPHYLOCOCCUS AUREUS SUSCEPTIBILITIES TO FOLLOW Performed at [facility], [address], [place], [place] [address]    Report Status PENDING  Incomplete  Culture, blood (Routine X 2) w Reflex to ID Panel     Status: Abnormal (Preliminary result)   Collection Time: 07/31/17  9:37 AM  Result Value Ref Range Status   Specimen Description RIGHT ANTECUBITAL  Final   Special Requests IN PEDIATRIC BOTTLE Blood Culture adequate volume  Final   Culture  Setup Time   Final    GRAM POSITIVE COCCI IN CLUSTERS IN PEDIATRIC BOTTLE CRITICAL VALUE NOTED.  VALUE IS CONSISTENT WITH PREVIOUSLY REPORTED AND CALLED VALUE. Performed at [facility], [address], [place], [place] [address]    Culture STAPHYLOCOCCUS AUREUS (A)  Final   Report Status PENDING  Incomplete    Studies/Results: Dg Orthopantogram  Result Date: 08/02/2017 CLINICAL DATA:  Left maxillary pain for 2 weeks EXAM: ORTHOPANTOGRAM/PANORAMIC COMPARISON:  07/28/2017 FINDINGS: Multiple dental caries are noted throughout the mandible but primarily within a right molar. Multiple caries are noted within the residual teeth in the maxilla as well. No definitive changes of periapical abscess are seen. Mild mucosal thickening is noted within the left maxillary sinus similar to that  noted on prior CT. IMPRESSION: Multiple dental caries.  No definitive periapical abscess is noted. Mucosal thickening within the left maxillary sinus. This could be related to the patient's underlying discomfort. Electronically Signed   By: Alcide Clever M.D.   On: 08/02/2017 08:45   Ct Chest W Contrast  Result Date: 08/01/2017 CLINICAL DATA:  56 year old male with altered mental status and methicillin-resistant Staph aureus bacteremia. COPD. Subsequent encounter. EXAM: CT CHEST, ABDOMEN, AND PELVIS WITH CONTRAST TECHNIQUE: Multidetector CT imaging of the chest, abdomen and pelvis was performed following the standard protocol during bolus administration of intravenous contrast. CONTRAST:  ISOVUE-300 IOPAMIDOL (ISOVUE-300) INJECTION 61% COMPARISON:  07/31/2017 chest x-ray. 07/20/2017 and chest CT. 08/10/2013 CT angiogram abdomen and pelvis. FINDINGS: CT CHEST FINDINGS Cardiovascular: Top-normal heart size. Prominent 3 vessel coronary artery calcification. Calcification thoracic aorta. Ascending thoracic aorta measures up to 4.2 cm. Exam not optimized to evaluate for pulmonary embolus. No large central pulmonary embolus. Mediastinum/Nodes: Slightly enlarged calcified lymph node lower right paraesophageal region measuring up to 1.3 cm. Lungs/Pleura: Interval development of pulmonary edema and small pleural effusions. Bibasilar consolidation suggestive of atelectasis versus infiltrates. Additionally, subtle patchy hazy parenchymal changes in the upper lung zones bilaterally raises possibly of multifocal pneumonia. Musculoskeletal: Remote left-sided rib fractures. CT ABDOMEN  PELVIS FINDINGS Hepatobiliary: Slightly enlarged minimally lobulated liver spanning over 19 cm. Cannot exclude changes of early cirrhosis. No worrisome hepatic lesion. No worrisome focal hepatic lesion. Pancreas: No worrisome pancreatic mass or inflammation. Spleen: Enlarged spleen spanning over 16 cm with peripheral wedge-shaped  hypodensities raises possibility of embolic splenic infarcts. Adrenals/Urinary Tract: No obstructing stone or hydronephrosis. Small wedge-shaped hypodensity inferior aspect left kidney. Question small embolic infarct. No adrenal mass. Foley catheter in place with decompressed urinary bladder with circumferential wall thickening. Stomach/Bowel: Narrowing of the mid to distal transverse colon may represent peristalsis although limiting evaluation for detection of underlying mass. Surgical clips right lower quadrant may indicate prior appendectomy. Upper abdominal 1.2 cm hernia through which knuckle of small bowel traverses but does not cause obstruction. No extraluminal bowel inflammatory process or free air Vascular/Lymphatic: Atherosclerotic changes aorta without aneurysm. Atherosclerotic changes aortic branch vessels with narrowing without large vessel occlusion. No worrisome pelvic or abdominal adenopathy Reproductive: No worrisome abnormality Other: Fat and vessel containing hernias Musculoskeletal: Degenerative changes lumbar spine. 1.1 cm lucency right L5 pedicle/ superior facet may represent result of subchondral cystic changes given the adjacent facet degenerative changes. IMPRESSION: CHEST CT Interval development of pulmonary edema and small pleural effusions. Bibasilar consolidation suggestive of atelectasis versus infiltrates. Additionally, subtle patchy hazy parenchymal changes in the upper lung zones bilaterally raises possibly of multifocal pneumonia. Prominent 3 vessel coronary artery calcification. Aortic Atherosclerosis (ICD10-I70.0). Ascending thoracic aorta measures up to 4.2 cm. Recommend annual imaging followup by CTA or MRA. This recommendation follows 2010 ACCF/AHA/AATS/ACR/ASA/SCA/SCAI/SIR/STS/SVM Guidelines for the Diagnosis and Management of Patients with Thoracic Aortic Disease. Circulation. 2010; 121: [id number] Slightly enlarged calcified lymph node lower right paraesophageal region  measuring up to 1.3 cm. Etiology/significance indeterminate. ABDOMINAL AND PELVIC CT Enlarged spleen spanning over 16 cm with peripheral wedge-shaped hypodensities raises possibility of septic embolic splenic infarcts. Small wedge-shaped hypodensity inferior aspect left kidney. Question small septic embolic infarct. Slightly enlarged minimally lobulated liver spanning over 19 cm. Cannot exclude changes of early cirrhosis. Narrowing of the mid to distal transverse colon may represent peristalsis although limiting evaluation for detection of underlying mass. Surgical clips right lower quadrant may indicate prior appendectomy. Upper abdominal 1.2 cm hernia through which knuckle of small bowel traverses but does not cause obstruction. No extraluminal bowel inflammatory process or free air. Aortic Atherosclerosis (ICD10-I70.0). 1.1 cm lucency right L5 pedicle/ superior facet may represent result of subchondral cystic changes given the adjacent facet degenerative changes. These results will be called to the ordering clinician or representative by the Radiologist Assistant, and communication documented in the PACS or zVision Dashboard. Electronically Signed   By: Lacy Duverney M.D.   On: 08/01/2017 15:23   Ct Abdomen Pelvis W Contrast  Result Date: 08/01/2017 CLINICAL DATA:  56 year old male with altered mental status and methicillin-resistant Staph aureus bacteremia. COPD. Subsequent encounter. EXAM: CT CHEST, ABDOMEN, AND PELVIS WITH CONTRAST TECHNIQUE: Multidetector CT imaging of the chest, abdomen and pelvis was performed following the standard protocol during bolus administration of intravenous contrast. CONTRAST:  ISOVUE-300 IOPAMIDOL (ISOVUE-300) INJECTION 61% COMPARISON:  07/31/2017 chest x-ray. 07/20/2017 and chest CT. 08/10/2013 CT angiogram abdomen and pelvis. FINDINGS: CT CHEST FINDINGS Cardiovascular: Top-normal heart size. Prominent 3 vessel coronary artery calcification. Calcification thoracic  aorta. Ascending thoracic aorta measures up to 4.2 cm. Exam not optimized to evaluate for pulmonary embolus. No large central pulmonary embolus. Mediastinum/Nodes: Slightly enlarged calcified lymph node lower right paraesophageal region measuring up to 1.3 cm. Lungs/Pleura: Interval development of pulmonary  edema and small pleural effusions. Bibasilar consolidation suggestive of atelectasis versus infiltrates. Additionally, subtle patchy hazy parenchymal changes in the upper lung zones bilaterally raises possibly of multifocal pneumonia. Musculoskeletal: Remote left-sided rib fractures. CT ABDOMEN PELVIS FINDINGS Hepatobiliary: Slightly enlarged minimally lobulated liver spanning over 19 cm. Cannot exclude changes of early cirrhosis. No worrisome hepatic lesion. No worrisome focal hepatic lesion. Pancreas: No worrisome pancreatic mass or inflammation. Spleen: Enlarged spleen spanning over 16 cm with peripheral wedge-shaped hypodensities raises possibility of embolic splenic infarcts. Adrenals/Urinary Tract: No obstructing stone or hydronephrosis. Small wedge-shaped hypodensity inferior aspect left kidney. Question small embolic infarct. No adrenal mass. Foley catheter in place with decompressed urinary bladder with circumferential wall thickening. Stomach/Bowel: Narrowing of the mid to distal transverse colon may represent peristalsis although limiting evaluation for detection of underlying mass. Surgical clips right lower quadrant may indicate prior appendectomy. Upper abdominal 1.2 cm hernia through which knuckle of small bowel traverses but does not cause obstruction. No extraluminal bowel inflammatory process or free air Vascular/Lymphatic: Atherosclerotic changes aorta without aneurysm. Atherosclerotic changes aortic branch vessels with narrowing without large vessel occlusion. No worrisome pelvic or abdominal adenopathy Reproductive: No worrisome abnormality Other: Fat and vessel containing hernias  Musculoskeletal: Degenerative changes lumbar spine. 1.1 cm lucency right L5 pedicle/ superior facet may represent result of subchondral cystic changes given the adjacent facet degenerative changes. IMPRESSION: CHEST CT Interval development of pulmonary edema and small pleural effusions. Bibasilar consolidation suggestive of atelectasis versus infiltrates. Additionally, subtle patchy hazy parenchymal changes in the upper lung zones bilaterally raises possibly of multifocal pneumonia. Prominent 3 vessel coronary artery calcification. Aortic Atherosclerosis (ICD10-I70.0). Ascending thoracic aorta measures up to 4.2 cm. Recommend annual imaging followup by CTA or MRA. This recommendation follows 2010 ACCF/AHA/AATS/ACR/ASA/SCA/SCAI/SIR/STS/SVM Guidelines for the Diagnosis and Management of Patients with Thoracic Aortic Disease. Circulation. 2010; 121: [id number] Slightly enlarged calcified lymph node lower right paraesophageal region measuring up to 1.3 cm. Etiology/significance indeterminate. ABDOMINAL AND PELVIC CT Enlarged spleen spanning over 16 cm with peripheral wedge-shaped hypodensities raises possibility of septic embolic splenic infarcts. Small wedge-shaped hypodensity inferior aspect left kidney. Question small septic embolic infarct. Slightly enlarged minimally lobulated liver spanning over 19 cm. Cannot exclude changes of early cirrhosis. Narrowing of the mid to distal transverse colon may represent peristalsis although limiting evaluation for detection of underlying mass. Surgical clips right lower quadrant may indicate prior appendectomy. Upper abdominal 1.2 cm hernia through which knuckle of small bowel traverses but does not cause obstruction. No extraluminal bowel inflammatory process or free air. Aortic Atherosclerosis (ICD10-I70.0). 1.1 cm lucency right L5 pedicle/ superior facet may represent result of subchondral cystic changes given the adjacent facet degenerative changes. These results will be  called to the ordering clinician or representative by the Radiologist Assistant, and communication documented in the PACS or zVision Dashboard. Electronically Signed   By: Lacy DuverneySteven  Olson M.D.   On: 08/01/2017 15:23     Assessment/Plan: MRSA bacteremia (BCx+ 12-4, 12-7, 12-10) MV and Ao endocarditis CNS, splenic emboli Possible multifocal pneumonia on CT Delirium  Total days of antibiotics: 9 ceftaroline/daptomycin/rifampin  hopefully his blood has cleared.  Will send repeat BCx today WBC normal, afebrile His LFTs are normal.  Dental extractions 12-17? Delirium per primary team.            Johny SaxJeffrey Shaleka Brines MD, FACP Infectious Diseases (pager) [phone] [email or web address] 08/03/2017, 10:01 AM  LOS: 9 days

## 2017-08-03 NOTE — Progress Notes (Signed)
PROGRESS NOTE:  08/03/2017 Reginald [name] [id number]  VITALS: BP 124/62 (BP Location: Left Arm)   Pulse (!) 49   Temp 98 F (36.7 C) (Oral)   Resp 14   Ht 5\' 9"  (1.753 m)   Wt 237 lb 3.4 oz (107.6 kg)   SpO2 96%   BMI 35.03 kg/m   Lab Results  Component Value Date   WBC 8.1 08/03/2017   HGB 9.1 (L) 08/03/2017   HCT 28.9 (L) 08/03/2017   MCV 98.3 08/03/2017   PLT 286 08/03/2017   BMET    Component Value Date/Time   NA 139 08/03/2017 0304   K 3.6 08/03/2017 0304   CL 106 08/03/2017 0304   CO2 28 08/03/2017 0304   GLUCOSE 133 (H) 08/03/2017 0304   BUN 31 (H) 08/03/2017 0304   CREATININE 0.84 08/03/2017 0304   CALCIUM 8.6 (L) 08/03/2017 0304   GFRNONAA >60 08/03/2017 0304   GFRAA >60 08/03/2017 0304    Lab Results  Component Value Date   INR 1.25 08/02/2017   INR 1.14 07/30/2017   No results found for: PTT   Reginald [name] was 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  Patient with anticipated mitral valve replacement in the future.  Patient with recent cardiac catheterization and clearance for dental procedures in the operating room with general anesthesia with Dr. Delton SeeNelson.  Patient is scheduled for extraction of remaining teeth with alveoloplasty and pre-prosthetic surgery in the operating room with general anesthesia for Monday, 08/07/2017 at 7:30 AM.     SUBJECTIVE: The patient denies any acute medical or dental changes and agrees to proceed with treatment as planned.  EXAM: No sign of acute dental changes.  ASSESSMENT: Patient is affected by chronic apical periodontitis, multiple retained root segments, dental caries, chronic periodontitis, and loose teeth.  PLAN: Patient agrees to proceed with treatment as planned in the operating room as previously discussed and accepts the risks, benefits, and complications of the proposed treatment. Patient is aware of the risk for bleeding, bruising, swelling, infection, pain, nerve damage, soft  tissue damage, sinus involvement, root tip fracture, mandible fracture, and the risks of complications associated with the anesthesia. Patient also is aware of the potential for other complications up to and including death due to his overall cardiovascular and respiratory compromise.    Reginald [name], DDS

## 2017-08-03 NOTE — Progress Notes (Signed)
Progress Note  Patient Name: Reginald [name] Date of Encounter: 08/03/2017  Primary Cardiologist: New patient/ Dr Rennis GoldenHilty  Subjective   Feels better today, he was able to walk to the bathroom.   Inpatient Medications    Scheduled Meds: . chlorhexidine  15 mL Mouth Rinse BID  . folic acid  1 mg Intravenous Daily  . furosemide  40 mg Intravenous BID  . insulin aspart  0-20 Units Subcutaneous TID AC & HS  . mouth rinse  15 mL Mouth Rinse q12n4p  . metoprolol tartrate  25 mg Oral BID  . potassium chloride  20 mEq Oral Daily  . rifampin  300 mg Oral Q12H  . sodium chloride flush  3 mL Intravenous Q12H  . thiamine  100 mg Intravenous Daily   Or  . thiamine  100 mg Oral Daily   Continuous Infusions: . sodium chloride    . ceFTAROline (TEFLARO) IV 600 mg (08/03/17 1030)  . DAPTOmycin (CUBICIN)  IV Stopped (08/02/17 1814)   PRN Meds: sodium chloride, acetaminophen (TYLENOL) oral liquid 160 mg/5 mL, hydrALAZINE, ipratropium-albuterol, [DISCONTINUED] ondansetron **OR** ondansetron (ZOFRAN) IV, sodium chloride flush   Vital Signs    Vitals:   08/02/17 2100 08/02/17 2300 08/03/17 0414 08/03/17 0415  BP: 119/65 123/77  124/62  Pulse: 85 80  (!) 49  Resp: (!) 26 (!) 28  14  Temp:  98.7 F (37.1 C) 98 F (36.7 C) 98 F (36.7 C)  TempSrc:  Oral Oral Oral  SpO2: 97% 96%  96%  Weight:    237 lb 3.4 oz (107.6 kg)  Height:        Intake/Output Summary (Last 24 hours) at 08/03/2017 1052 Last data filed at 08/03/2017 0445 Gross per 24 hour  Intake 0 ml  Output 2327 ml  Net -2327 ml   Filed Weights   08/01/17 2156 08/02/17 0500 08/03/17 0415  Weight: 239 lb 13.8 oz (108.8 kg) 239 lb 13.8 oz (108.8 kg) 237 lb 3.4 oz (107.6 kg)    Telemetry    NSr - Personally Reviewed  ECG    No new tracing - Personally Reviewed  Physical Exam  In NAD GEN: No acute distress.   Neck: No JVD Cardiac: RRR, 2/6 holosystolic murmur at apex, no diastolic murmurs, rubs, or gallops.    Respiratory: Crackles to auscultation bilaterally. GI: Soft, nontender, non-distended  MS: No edema; No deformity. Neuro:  Nonfocal  Psych: Normal affect   Labs    Chemistry Recent Labs  Lab 07/28/17 0501  07/30/17 0228 08/02/17 0455 08/03/17 0304  NA 137   < > 143 140 139  K 4.0   < > 4.1 3.6 3.6  CL 101   < > 106 106 106  CO2 30   < > 31 27 28   GLUCOSE 207*   < > 190* 197* 133*  BUN 38*   < > 42* 34* 31*  CREATININE 0.81   < > 0.79 0.83 0.84  CALCIUM 8.4*   < > 9.0 9.0 8.6*  PROT 6.8  --  7.9  --  7.8  ALBUMIN 1.9*  --  2.1*  --  1.9*  AST 30  --  33  --  34  ALT 39  --  38  --  28  ALKPHOS 187*  --  207*  --  147*  BILITOT 1.7*  --  1.4*  --  2.3*  GFRNONAA >60   < > >60 >60 >60  GFRAA >60   < > >  60 >60 >60  ANIONGAP 6   < > 6 7 5    < > = values in this interval not displayed.     Hematology Recent Labs  Lab 08/01/17 0344 08/02/17 0710 08/03/17 0304  WBC 10.2 11.2* 8.1  RBC 3.57* 3.57* 2.94*  HGB 11.2* 11.1* 9.1*  HCT 35.5* 35.3* 28.9*  MCV 99.4 98.9 98.3  MCH 31.4 31.1 31.0  MCHC 31.5 31.4 31.5  RDW 14.2 14.2 13.9  PLT PLATELET CLUMPS NOTED ON SMEAR, COUNT APPEARS ADEQUATE 351 286   Cardiac EnzymesNo results for input(s): TROPONINI in the last 168 hours. No results for input(s): TROPIPOC in the last 168 hours.   BNP No results for input(s): BNP, PROBNP in the last 168 hours.  DDimer No results for input(s): DDIMER in the last 168 hours.   Radiology    Dg Orthopantogram  Result Date: 08/02/2017 CLINICAL DATA:  Left maxillary pain for 2 weeks EXAM: ORTHOPANTOGRAM/PANORAMIC COMPARISON:  07/28/2017 FINDINGS: Multiple dental caries are noted throughout the mandible but primarily within a right molar. Multiple caries are noted within the residual teeth in the maxilla as well. No definitive changes of periapical abscess are seen. Mild mucosal thickening is noted within the left maxillary sinus similar to that noted on prior CT. IMPRESSION: Multiple dental  caries.  No definitive periapical abscess is noted. Mucosal thickening within the left maxillary sinus. This could be related to the patient's underlying discomfort. Electronically Signed   By: Alcide Clever M.D.   On: 08/02/2017 08:45   Ct Chest W Contrast Ct Abdomen Pelvis W Contrast   IMPRESSION: CHEST CT Interval development of pulmonary edema and small pleural effusions. Bibasilar consolidation suggestive of atelectasis versus infiltrates. Additionally, subtle patchy hazy parenchymal changes in the upper lung zones bilaterally raises possibly of multifocal pneumonia. Prominent 3 vessel coronary artery calcification. Aortic Atherosclerosis (ICD10-I70.0). Ascending thoracic aorta measures up to 4.2 cm. Recommend annual imaging followup by CTA or MRA. This recommendation follows 2010 ACCF/AHA/AATS/ACR/ASA/SCA/SCAI/SIR/STS/SVM Guidelines for the Diagnosis and Management of Patients with Thoracic Aortic Disease. Circulation. 2010; 121: Z610-R604 Slightly enlarged calcified lymph node lower right paraesophageal region measuring up to 1.3 cm. Etiology/significance indeterminate. ABDOMINAL AND PELVIC CT Enlarged spleen spanning over 16 cm with peripheral wedge-shaped hypodensities raises possibility of septic embolic splenic infarcts. Small wedge-shaped hypodensity inferior aspect left kidney. Question small septic embolic infarct. Slightly enlarged minimally lobulated liver spanning over 19 cm. Cannot exclude changes of early cirrhosis. Narrowing of the mid to distal transverse colon may represent peristalsis although limiting evaluation for detection of underlying mass. Surgical clips right lower quadrant may indicate prior appendectomy. Upper abdominal 1.2 cm hernia through which knuckle of small bowel traverses but does not cause obstruction. No extraluminal bowel inflammatory process or free air. Aortic Atherosclerosis (ICD10-I70.0). 1.1 cm lucency right L5 pedicle/ superior facet may represent result of  subchondral cystic changes given the adjacent facet degenerative changes. These results will be called to the ordering clinician or representative by the Radiologist Assistant, and communication documented in the PACS or zVision Dashboard. Electronically Signed   By: Lacy Duverney M.D.   On: 08/01/2017 15:23   Cardiac Studies   07/27/2017 TEE  - Left ventricle: Normal LV size. Moderate LVH. Systolic function   was normal. The estimated ejection fraction was in the range of   60% to 65%. Wall motion was normal; there were no regional wall   motion abnormalities. - Aortic valve: Trileaflet. Small echogenic mass at the tip of the  non-coronary cusp. lambl&'s excrescences. Trivial AI. - Aorta: Grade 3 mobile atheroma of the proximal aortic arch. - Mitral valve: Large 2-3 cm mass of the A3 segment of the anterior   leaflet, prolapsing across the posterior leaflet. There is a   distinct posterior jet which is centrally directed of severe   regurgitation, suggestive of perforation. There is dropout seen   through the vegetation or in the area of the P3 scallop - not   entirely clear, but concerning for possible perforation. There is   vegetation to a lesser extent on the posterior leaflet tip.   Effective regurgitant orifice (PISA): 1.01 cm^2. Regurgitant   volume (PISA): 155 ml. - Left atrium: The atrium was dilated. No evidence of thrombus in   the atrial cavity or appendage. - Right atrium: No evidence of thrombus in the atrial cavity or   appendage. - Atrial septum: No defect or patent foramen ovale was identified. - Pulmonic valve: No evidence of vegetation.  Impressions: - Large mass of the A3 segment of the anterior mitral leaflet,   prolapsing over the posterior leaflet. Posteriorly located   central regurgitant jet suggestive of possible perforation - in   the region of the P3 segment. Endocarditis of the posterior   leaflet tip is noted. There is a possible small mass on the    non-coronary cusp of the aortic valve with trivial AI - this   could be resolved endocarditis or sclerosis as well.    Patient Profile     56 y.o. 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  Assessment & Plan    1. CHF: diuresed well overnight, improved crackles, good diuresis, I will continue  lasix to 40 mg IV BID, crea is stable.   2. Severe MR: will eventually need MV replacement.BCx still positive 12/10, CT surgery until BCx are sterile.  3. 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 High risk for more strokes. Would proceed with surgery sooner rather than later, but need sterile BCx first. Still positive yesterday,  ID follows, vancomycin was switched to daptomycin, rifampin added today, he had a CT chest/abdomen/pelvis to look for another possible source yesterday with no significant findings. Left heart catheterization showed non-obstructive CAD.  Plan for teeth extraction on Monday, surgery once BC negative, and he is stronger. We will start physical therapy.  4. Hypertension, tachycardia - started metoprolol 25 mg po BID. Improved.  For questions or updates, please contact [facility] Please consult [email or web address] for contact info under Cardiology/STEMI.     Signed, Tobias AlexanderKatarina Dickson Kostelnik, MD  08/03/2017, 10:52 AM

## 2017-08-03 NOTE — Progress Notes (Signed)
Indwelling foley catheter discontinued as per verbal order from Dr. Margo AyeHall.  Pt due to void by 1900.

## 2017-08-04 DIAGNOSIS — K089 Disorder of teeth and supporting structures, unspecified: Secondary | ICD-10-CM

## 2017-08-04 LAB — CULTURE, BLOOD (ROUTINE X 2)
SPECIAL REQUESTS: ADEQUATE
Special Requests: ADEQUATE

## 2017-08-04 LAB — GLUCOSE, CAPILLARY
GLUCOSE-CAPILLARY: 134 mg/dL — AB (ref 65–99)
GLUCOSE-CAPILLARY: 156 mg/dL — AB (ref 65–99)
GLUCOSE-CAPILLARY: 135 mg/dL — AB (ref 65–99)

## 2017-08-04 LAB — BASIC METABOLIC PANEL
ANION GAP: 7 (ref 5–15)
BUN: 27 mg/dL — ABNORMAL HIGH (ref 6–20)
CHLORIDE: 103 mmol/L (ref 101–111)
CO2: 25 mmol/L (ref 22–32)
Calcium: 8.7 mg/dL — ABNORMAL LOW (ref 8.9–10.3)
Creatinine, Ser: 0.85 mg/dL (ref 0.61–1.24)
Glucose, Bld: 120 mg/dL — ABNORMAL HIGH (ref 65–99)
POTASSIUM: 3.4 mmol/L — AB (ref 3.5–5.1)
SODIUM: 135 mmol/L (ref 135–145)

## 2017-08-04 LAB — CBC
HCT: 27.3 % — ABNORMAL LOW (ref 39.0–52.0)
HEMOGLOBIN: 8.7 g/dL — AB (ref 13.0–17.0)
MCH: 30.4 pg (ref 26.0–34.0)
MCHC: 31.9 g/dL (ref 30.0–36.0)
MCV: 95.5 fL (ref 78.0–100.0)
PLATELETS: 277 10*3/uL (ref 150–400)
RBC: 2.86 MIL/uL — AB (ref 4.22–5.81)
RDW: 13.7 % (ref 11.5–15.5)
WBC: 7.2 10*3/uL (ref 4.0–10.5)

## 2017-08-04 MED ORDER — KETOROLAC TROMETHAMINE 15 MG/ML IJ SOLN
15.0000 mg | Freq: Once | INTRAMUSCULAR | Status: AC
  Administered 2017-08-04: 15 mg via INTRAVENOUS
  Filled 2017-08-04: qty 1

## 2017-08-04 MED ORDER — FUROSEMIDE 10 MG/ML IJ SOLN
40.0000 mg | Freq: Three times a day (TID) | INTRAMUSCULAR | Status: DC
  Administered 2017-08-04 – 2017-08-10 (×18): 40 mg via INTRAVENOUS
  Filled 2017-08-04 (×18): qty 4

## 2017-08-04 MED ORDER — POTASSIUM CHLORIDE CRYS ER 20 MEQ PO TBCR
40.0000 meq | EXTENDED_RELEASE_TABLET | Freq: Once | ORAL | Status: AC
  Administered 2017-08-04: 40 meq via ORAL
  Filled 2017-08-04: qty 2

## 2017-08-04 MED ORDER — POTASSIUM CHLORIDE 20 MEQ/15ML (10%) PO SOLN
20.0000 meq | Freq: Two times a day (BID) | ORAL | Status: DC
  Administered 2017-08-04 – 2017-08-10 (×12): 20 meq via ORAL
  Filled 2017-08-04 (×12): qty 15

## 2017-08-04 NOTE — Progress Notes (Signed)
  Speech Language Pathology Treatment: Dysphagia  Patient Details Name: Reginald [name] MRN: [id number] DOB: 08/02/1961 Today's Date: 08/04/2017 Time: 8115-7262 SLP Time Calculation (min) (ACUTE ONLY): 9 min  Assessment / Plan / Recommendation Clinical Impression  Pt seen for follow up after BSE completed 08/01/17. Per RN and pt report, pt is eating well without overt s/s aspiration or decline in respiratory status. Lungs are stable, pt is afebrile. No overt s/s aspiration observed with thin liquids via straw. Pt/family educated regarding increased risk of dysphagia following intubation, and encouraged to notify RN/MD if needs arise . ST to sign off at this time. Please reconsult if needed.    HPI HPI: 56 yo male adm to [facility] from outside hospital with AMS, MRSA bacteremia - pt with left facial droop per notes.  Pending head MRI.  PMH + for ETOH,  CHF, COPD, tobacco use.  Pt CXR 12/4 can not rule out pna vs small pleural effusion. Pt required intubation due to AMS, hypoxia intubated 12/6-12/10.  Swallow evaluation ordered.        SLP Plan  Discharge SLP treatment due to all goals met       Recommendations  Diet recommendations: Regular;Thin liquid Liquids provided via: Cup;Straw Medication Administration: Whole meds with liquid Supervision: Patient able to self feed Compensations: Slow rate;Small sips/bites;Minimize environmental distractions Postural Changes and/or Swallow Maneuvers: Seated upright 90 degrees                Oral Care Recommendations: Oral care BID Follow up Recommendations: None SLP Visit Diagnosis: Dysphagia, unspecified (R13.10) Plan: Discharge SLP treatment due to (comment);All goals met       GO              Reginald [name] [facility], CCC-SLP Speech Language Pathologist [phone]  Shonna Chock 08/04/2017, 9:21 AM

## 2017-08-04 NOTE — Progress Notes (Signed)
INFECTIOUS DISEASE PROGRESS NOTE  ID: Reginald [name] is a 56 y.o. male with  Principal Problem:   MRSA bacteremia Active Problems:   Mitral valve vegetation   Acute respiratory failure (HCC)   Alcohol abuse   Thrombocytopenia (HCC)   Abnormal liver enzymes   Acute encephalopathy   Acute diastolic CHF (congestive heart failure) (HCC)   Sepsis (HCC)   Endocarditis of mitral valve   Cerebral septic emboli (HCC)   Hypertensive heart disease with heart failure (HCC)   Endotracheal tube present   Acute respiratory disease  Subjective: No complaints.   Abtx:  Anti-infectives (From admission, onward)   Start     Dose/Rate Route Frequency Ordered Stop   08/02/17 1400  DAPTOmycin (CUBICIN) 1,000 mg in sodium chloride 0.9 % IVPB     1,000 mg 240 mL/hr over 30 Minutes Intravenous Every 24 hours 08/02/17 1100     08/02/17 1000  rifampin (RIFADIN) capsule 300 mg     300 mg Oral Every 12 hours 08/02/17 0912     07/31/17 1400  DAPTOmycin (CUBICIN) 700 mg in sodium chloride 0.9 % IVPB  Status:  Discontinued     700 mg 228 mL/hr over 30 Minutes Intravenous Every 24 hours 07/31/17 1254 08/02/17 1100   07/31/17 1330  ceftaroline (TEFLARO) 600 mg in sodium chloride 0.9 % 250 mL IVPB     600 mg 250 mL/hr over 60 Minutes Intravenous Every 12 hours 07/31/17 1240     07/30/17 1000  vancomycin (VANCOCIN) 1,750 mg in sodium chloride 0.9 % 500 mL IVPB  Status:  Discontinued     1,750 mg 250 mL/hr over 120 Minutes Intravenous Every 24 hours 07/30/17 0449 07/31/17 1238   07/29/17 1500  vancomycin (VANCOCIN) 1,500 mg in sodium chloride 0.9 % 500 mL IVPB     1,500 mg 250 mL/hr over 120 Minutes Intravenous Every 12 hours 07/29/17 0704 07/29/17 1818   07/26/17 1400  vancomycin (VANCOCIN) 1,500 mg in sodium chloride 0.9 % 500 mL IVPB  Status:  Discontinued     1,500 mg 250 mL/hr over 120 Minutes Intravenous Every 12 hours 07/26/17 1254 07/29/17 0247   07/25/17 2200  vancomycin (VANCOCIN) 2,500 mg  in sodium chloride 0.9 % 500 mL IVPB  Status:  Discontinued     2,500 mg 250 mL/hr over 120 Minutes Intravenous  Once 07/25/17 2124 07/25/17 2210   07/25/17 2130  vancomycin (VANCOCIN) IVPB 1000 mg/200 mL premix  Status:  Discontinued     1,000 mg 200 mL/hr over 60 Minutes Intravenous  Once 07/25/17 2118 07/25/17 2123      Medications:  Scheduled: . chlorhexidine  15 mL Mouth Rinse BID  . enoxaparin (LOVENOX) injection  50 mg Subcutaneous QAC breakfast  . folic acid  1 mg Intravenous Daily  . furosemide  40 mg Intravenous Q8H  . insulin aspart  0-20 Units Subcutaneous TID AC & HS  . mouth rinse  15 mL Mouth Rinse q12n4p  . metoprolol tartrate  25 mg Oral BID  . potassium chloride  20 mEq Oral BID  . rifampin  300 mg Oral Q12H  . sodium chloride flush  3 mL Intravenous Q12H  . thiamine  100 mg Intravenous Daily   Or  . thiamine  100 mg Oral Daily    Objective: Vital signs in last 24 hours: Temp:  [97.6 F (36.4 C)-98.2 F (36.8 C)] 97.6 F (36.4 C) (12/14 0335) Pulse Rate:  [72-85] 80 (12/14 0345) Resp:  [16-25] 25 (12/14  0345) BP: (114-145)/(69-77) 145/77 (12/14 0335) SpO2:  [91 %-98 %] 98 % (12/14 0345) Weight:  [107.7 kg (237 lb 7 oz)] 107.7 kg (237 lb 7 oz) (12/14 0345)   General appearance: alert, cooperative and no distress Resp: clear to auscultation bilaterally Cardio: regular rate and rhythm GI: normal findings: bowel sounds normal and soft, non-tender Extremities: edema none  Lab Results Recent Labs    08/03/17 0304 08/04/17 0231  WBC 8.1 7.2  HGB 9.1* 8.7*  HCT 28.9* 27.3*  NA 139 135  K 3.6 3.4*  CL 106 103  CO2 28 25  BUN 31* 27*  CREATININE 0.84 0.85   Liver Panel Recent Labs    08/03/17 0304  PROT 7.8  ALBUMIN 1.9*  AST 34  ALT 28  ALKPHOS 147*  BILITOT 2.3*  BILIDIR 1.2*  IBILI 1.1*   Sedimentation Rate No results for input(s): ESRSEDRATE in the last 72 hours. C-Reactive Protein No results for input(s): CRP in the last 72  hours.  Microbiology: Recent Results (from the past 240 hour(s))  Culture, blood (x 2)     Status: None   Collection Time: 07/25/17  9:09 PM  Result Value Ref Range Status   Specimen Description BLOOD RIGHT HAND  Final   Special Requests   Final    BOTTLES DRAWN AEROBIC AND ANAEROBIC Blood Culture adequate volume   Culture   Final    NO GROWTH 5 DAYS Performed at [facility], [address] [place], [place] [address]    Report Status 07/31/2017 FINAL  Final  Culture, blood (x 2)     Status: Abnormal   Collection Time: 07/25/17  9:14 PM  Result Value Ref Range Status   Specimen Description BLOOD RIGHT ANTECUBITAL  Final   Special Requests IN PEDIATRIC BOTTLE Blood Culture adequate volume  Final   Culture  Setup Time   Final    GRAM POSITIVE COCCI IN PEDIATRIC BOTTLE CRITICAL RESULT CALLED TO, READ BACK BY AND VERIFIED WITH: JESSE GRIMSLEY PHARMD AT 2225 ON [id number] BY SJW Performed at [facility], [address] [place], [place] [address]    Culture METHICILLIN RESISTANT STAPHYLOCOCCUS AUREUS (A)  Final   Report Status 07/28/2017 FINAL  Final   Organism ID, Bacteria METHICILLIN RESISTANT STAPHYLOCOCCUS AUREUS  Final      Susceptibility   Methicillin resistant staphylococcus aureus - MIC*    CIPROFLOXACIN >=8 RESISTANT Resistant     ERYTHROMYCIN >=8 RESISTANT Resistant     GENTAMICIN <=0.5 SENSITIVE Sensitive     OXACILLIN >=4 RESISTANT Resistant     TETRACYCLINE <=1 SENSITIVE Sensitive     VANCOMYCIN <=0.5 SENSITIVE Sensitive     TRIMETH/SULFA <=10 SENSITIVE Sensitive     CLINDAMYCIN <=0.25 SENSITIVE Sensitive     RIFAMPIN <=0.5 SENSITIVE Sensitive     Inducible Clindamycin NEGATIVE Sensitive     * METHICILLIN RESISTANT STAPHYLOCOCCUS AUREUS  Blood Culture ID Panel (Reflexed)     Status: Abnormal   Collection Time: 07/25/17  9:14 PM  Result Value Ref Range Status   Enterococcus species NOT DETECTED NOT DETECTED Final   Listeria monocytogenes NOT DETECTED NOT  DETECTED Final   Staphylococcus species DETECTED (A) NOT DETECTED Final    Comment: CRITICAL RESULT CALLED TO, READ BACK BY AND VERIFIED WITH: JESSEE GRIMSLEY PHARMD AT 2225 ON [id number] BY SJW    Staphylococcus aureus DETECTED (A) NOT DETECTED Final    Comment: Methicillin (oxacillin)-resistant Staphylococcus aureus (MRSA). MRSA is predictably resistant to beta-lactam  antibiotics (except ceftaroline). Preferred therapy is vancomycin unless clinically contraindicated. Patient requires contact precautions if  hospitalized. CRITICAL RESULT CALLED TO, READ BACK BY AND VERIFIED WITH: JESSE GRIMSLEY PHARMD AT 2225 ON [id number] BY SJW    Methicillin resistance DETECTED (A) NOT DETECTED Final    Comment: CRITICAL RESULT CALLED TO, READ BACK BY AND VERIFIED WITH: JESSE GRIMSLEY PHARMD AT 2225 ON [id number] BY SJW    Streptococcus species NOT DETECTED NOT DETECTED Final   Streptococcus agalactiae NOT DETECTED NOT DETECTED Final   Streptococcus pneumoniae NOT DETECTED NOT DETECTED Final   Streptococcus pyogenes NOT DETECTED NOT DETECTED Final   Acinetobacter baumannii NOT DETECTED NOT DETECTED Final   Enterobacteriaceae species NOT DETECTED NOT DETECTED Final   Enterobacter cloacae complex NOT DETECTED NOT DETECTED Final   Escherichia coli NOT DETECTED NOT DETECTED Final   Klebsiella oxytoca NOT DETECTED NOT DETECTED Final   Klebsiella pneumoniae NOT DETECTED NOT DETECTED Final   Proteus species NOT DETECTED NOT DETECTED Final   Serratia marcescens NOT DETECTED NOT DETECTED Final   Haemophilus influenzae NOT DETECTED NOT DETECTED Final   Neisseria meningitidis NOT DETECTED NOT DETECTED Final   Pseudomonas aeruginosa NOT DETECTED NOT DETECTED Final   Candida albicans NOT DETECTED NOT DETECTED Final   Candida glabrata NOT DETECTED NOT DETECTED Final   Candida krusei NOT DETECTED NOT DETECTED Final   Candida parapsilosis NOT DETECTED NOT DETECTED Final   Candida tropicalis NOT DETECTED NOT DETECTED Final      Comment: Performed at [facility], [address] [place], [place] [address]  MRSA PCR Screening     Status: Abnormal   Collection Time: 07/27/17  6:20 PM  Result Value Ref Range Status   MRSA by PCR POSITIVE (A) NEGATIVE Final    Comment:        The GeneXpert MRSA Assay (FDA approved for NASAL specimens only), is one component of a comprehensive MRSA colonization surveillance program. It is not intended to diagnose MRSA infection nor to guide or monitor treatment for MRSA infections. RESULT CALLED TO, READ BACK BY AND VERIFIED WITH: WOODY,J RN 12.6.18 @2014  ZANDO,C   Culture, blood (routine x 2)     Status: Abnormal   Collection Time: 07/28/17  7:44 AM  Result Value Ref Range Status   Specimen Description BLOOD LEFT HAND  Final   Special Requests IN PEDIATRIC BOTTLE Blood Culture adequate volume  Final   Culture  Setup Time   Final    GRAM POSITIVE COCCI IN CLUSTERS IN PEDIATRIC BOTTLE CRITICAL VALUE NOTED.  VALUE IS CONSISTENT WITH PREVIOUSLY REPORTED AND CALLED VALUE.    Culture (A)  Final    STAPHYLOCOCCUS AUREUS SUSCEPTIBILITIES PERFORMED ON PREVIOUS CULTURE WITHIN THE LAST 5 DAYS. Performed at [facility], [address] [place], [place] [address]    Report Status 07/30/2017 FINAL  Final  Culture, blood (routine x 2)     Status: Abnormal   Collection Time: 07/28/17  7:45 AM  Result Value Ref Range Status   Specimen Description BLOOD RIGHT HAND  Final   Special Requests IN PEDIATRIC BOTTLE Blood Culture adequate volume  Final   Culture  Setup Time   Final    GRAM POSITIVE COCCI IN CLUSTERS IN PEDIATRIC BOTTLE CRITICAL VALUE NOTED.  VALUE IS CONSISTENT WITH PREVIOUSLY REPORTED AND CALLED VALUE.    Culture (A)  Final    STAPHYLOCOCCUS AUREUS SUSCEPTIBILITIES PERFORMED ON PREVIOUS CULTURE WITHIN THE LAST 5 DAYS. Performed at [facility],  [address] [place], [place] [address]    Report Status 07/30/2017 FINAL  Final  Culture, blood  (Routine X 2) w Reflex to ID Panel     Status: Abnormal   Collection Time: 07/31/17  9:37 AM  Result Value Ref Range Status   Specimen Description BLOOD RIGHT FOREARM  Final   Special Requests   Final    BOTTLES DRAWN AEROBIC AND ANAEROBIC Blood Culture adequate volume   Culture  Setup Time   Final    GRAM POSITIVE COCCI IN CLUSTERS ANAEROBIC BOTTLE ONLY CRITICAL VALUE NOTED.  VALUE IS CONSISTENT WITH PREVIOUSLY REPORTED AND CALLED VALUE. Performed at [facility], [address], [place], [place] [address]    Culture METHICILLIN RESISTANT STAPHYLOCOCCUS AUREUS (A)  Final   Report Status 08/04/2017 FINAL  Final   Organism ID, Bacteria METHICILLIN RESISTANT STAPHYLOCOCCUS AUREUS  Final      Susceptibility   Methicillin resistant staphylococcus aureus - MIC*    CIPROFLOXACIN >=8 RESISTANT Resistant     ERYTHROMYCIN >=8 RESISTANT Resistant     GENTAMICIN <=0.5 SENSITIVE Sensitive     OXACILLIN >=4 RESISTANT Resistant     TETRACYCLINE <=1 SENSITIVE Sensitive     VANCOMYCIN 1 SENSITIVE Sensitive     TRIMETH/SULFA <=10 SENSITIVE Sensitive     CLINDAMYCIN <=0.25 SENSITIVE Sensitive     RIFAMPIN <=0.5 SENSITIVE Sensitive     Inducible Clindamycin NEGATIVE Sensitive     * METHICILLIN RESISTANT STAPHYLOCOCCUS AUREUS  Culture, blood (Routine X 2) w Reflex to ID Panel     Status: Abnormal   Collection Time: 07/31/17  9:37 AM  Result Value Ref Range Status   Specimen Description RIGHT ANTECUBITAL  Final   Special Requests IN PEDIATRIC BOTTLE Blood Culture adequate volume  Final   Culture  Setup Time   Final    GRAM POSITIVE COCCI IN CLUSTERS IN PEDIATRIC BOTTLE CRITICAL VALUE NOTED.  VALUE IS CONSISTENT WITH PREVIOUSLY REPORTED AND CALLED VALUE.    Culture (A)  Final    STAPHYLOCOCCUS AUREUS SUSCEPTIBILITIES PERFORMED ON PREVIOUS CULTURE WITHIN THE LAST 5 DAYS. Performed at [facility], [address], [place], [place] [address]    Report Status 08/04/2017 FINAL  Final     Studies/Results: No results found.   Assessment/Plan: MRSA bacteremia (BCx+ 12-4, 12-7, 12-10) MV and Ao endocarditis CNS, splenic emboli Possible multifocal pneumonia on CT Delirium  Total days of antibiotics:10 ceftaroline/daptomycin/rifampin  edentulation on Monday Afebrile, Cr stable, WBC normal.  Await his repeat BCx (sent 12-13) to see if he has cleared. Then CVTS Will then begin to taper off anbx  ID available as needed over weekend.          Johny Sax MD, FACP Infectious Diseases (pager) [phone] [email or web address] 08/04/2017, 10:22 AM  LOS: 10 days

## 2017-08-04 NOTE — Progress Notes (Signed)
PROGRESS NOTE  Reginald [name] ZOX:[id number] DOB: 1961/03/11 DOA: 07/25/2017 PCP: Patient, No Pcp Per  HPI/Recap of past 65 hours:  56 year old male with past medical history significant for COPD, diabetes mellitus, alcohol abuse, diastolic CHF, and AAA who was initially admitted to [facility] on 07/20/17 with nonspecific complaints of generalized weakness, low-grade fever, and muscle aches.  Blood cultures done at admission grew out MRSA and he was started on vancomycin.  2D echocardiogram at that time demonstrated a mitral valve vegetation.  ICU transfer at [facility] for Precedex infusion with presumed alcohol withdrawal was weaned off on 12/3.  Transferred to [facility] long hospital for infectious diseases consultation.  On 12/4 found to have a new facial droop.  MRI brain revealed multiple infarcts to both cerebral hemispheres consistent with septic emboli. Transferred to [facility] hospital to continue workup and treatment.  No acute events reported overnight. Dental surgeon planning extraction of remaining teeth with alveoloplasty and pre-prosthetic surgery in the operating room with general anesthesia for Monday, 08/07/2017 at 7:30 AM.  Patient seen and examined at his bedside. 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 He has no complaints at this time.      Assessment/Plan: Principal Problem:   MRSA bacteremia Active Problems:   Mitral valve vegetation   Acute respiratory failure (HCC)   Alcohol abuse   Thrombocytopenia (HCC)   Abnormal liver enzymes   Acute encephalopathy   Acute diastolic CHF (congestive heart failure) (HCC)   Sepsis (HCC)   Endocarditis of mitral valve   Cerebral septic emboli (HCC)   Hypertensive heart disease with heart failure (HCC)   Endotracheal tube present   Acute respiratory disease  AMS, resolved, possibly 2/2 to septic emboli -ID following  MRSA bacteremia    -possible source oral from severe dental caries-dental surgery following -blood cultures positive 07/25/17, 07/28/17, 07/31/17 -IV ceftarolin, IV daptomycin day #10 -repeat blood cx x2 (08/03/17) no growth less than 24 hours -ID following. We appreciate recommendations -afebrile no leukocytosis -cbc am  MV endocarditis -ID, cardiology following -may require M valve replacement. Defer to cardiology -c/w IV antibiotics -right/left heart cath 08/02/17 -TEE 07/27/17: Impressions: Large mass of the A3 segment of the anterior mitral leaflet, prolapsing over the posterior leaflet. Posteriorly located central regurgitant jet suggestive of possible perforation - in the region of the P3 segment. Endocarditis of the posterior leaflet tip is noted. There is a possible small mass on the non-coronary cusp of the aortic valve with trivial AI - this could be resolved endocarditis or sclerosis as well.  CNS, septic emboli -management as stated above  Possible multifocal pneumonia on CT, poa -continue IV antibiotics  Poor dentition -dental surgeon following -planning extraction of remaining teeth with alveoloplasty and pre-prosthetic surgery in the operating room with general anesthesia for Monday, 08/07/2017 at 7:30 AM.   -we appreciate recommendations  Chronic normocytic anemia -hg 8.7 from 9.1 from 11.2 baseline hg 11 -mcv 98 -no sign of overt bleeding -cbc am   Code Status: Full  Family Communication: Family members at bedside  Disposition Plan: will stay another midnight to continue IV antibiotics. Possible dental extraction of remaining teeth with alveoloplasty and pre-prosthetic surgery in the operating room with general anesthesia for Monday, 08/07/2017 at 7:30 AM.     Consultants:  ID  Cardiology  Dental surgery  Procedures:  right/left [facility] 08/02/17  Antimicrobials:  IV vancomycin day #10  DVT prophylaxis:  sq lovenox 50 mg  daily   Objective: Vitals:   08/03/17 1940 08/03/17 2334 08/04/17 0335 08/04/17 0345  BP: 114/70 114/69 (!) 145/77   Pulse: 84 72 83 80  Resp: 19 (!) 21 18 (!) 25  Temp: 98 F (36.7 C) 98 F (36.7 C) 97.6 F (36.4 C)   TempSrc: Oral  Oral   SpO2: 92% 91% 96% 98%  Weight:    107.7 kg (237 lb 7 oz)  Height:        Intake/Output Summary (Last 24 hours) at 08/04/2017 0724 Last data filed at 08/04/2017 0342 Gross per 24 hour  Intake 1430 ml  Output 2550 ml  Net -1120 ml   Filed Weights   08/02/17 0500 08/03/17 0415 08/04/17 0345  Weight: 108.8 kg (239 lb 13.8 oz) 107.6 kg (237 lb 3.4 oz) 107.7 kg (237 lb 7 oz)    Exam:   General:  56 yo CM WD WN alert and oriented x 3  Cardiovascular: RRR no rubs or gallops  Respiratory: CTA no wheezes or rhonchi  Abdomen: soft NT ND NBS x4   Musculoskeletal: Moves all 4 extremities. Non focal  Skin: no noted open ulcerative lesions  Psychiatry: mood is appropriate for condition and setting   Data Reviewed: CBC: Recent Labs  Lab 07/30/17 0228 07/31/17 0324 08/01/17 0344 08/02/17 0710 08/03/17 0304 08/04/17 0231  WBC 10.7* 10.9* 10.2 11.2* 8.1 7.2  NEUTROABS 8.9* 9.1* 8.7* 9.9*  --   --   HGB 10.2* 10.1* 11.2* 11.1* 9.1* 8.7*  HCT 32.1* 32.3* 35.5* 35.3* 28.9* 27.3*  MCV 98.5 100.0 99.4 98.9 98.3 95.5  PLT 222 252 PLATELET CLUMPS NOTED ON SMEAR, COUNT APPEARS ADEQUATE 351 286 277   Basic Metabolic Panel: Recent Labs  Lab 07/28/17 1728 07/29/17 0233 07/30/17 0228 07/31/17 0324 08/01/17 0344 08/02/17 0455 08/03/17 0304 08/04/17 0231  NA  --  140 143  --   --  140 139 135  K  --  4.1 4.1  --   --  3.6 3.6 3.4*  CL  --  104 106  --   --  106 106 103  CO2  --  31 31  --   --  27 28 25   GLUCOSE  --  202* 190*  --   --  197* 133* 120*  BUN  --  39* 42*  --   --  34* 31* 27*  CREATININE  --  0.69 0.79  --   --  0.83 0.84 0.85  CALCIUM  --  8.7* 9.0  --   --  9.0 8.6* 8.7*  MG 2.0  --  1.9 2.2 2.1  --   --   --    PHOS 4.6  --  4.2 4.5 3.5  --   --   --    GFR: Estimated Creatinine Clearance: 116 mL/min (by C-G formula based on SCr of 0.85 mg/dL). Liver Function Tests: Recent Labs  Lab 07/30/17 0228 08/03/17 0304  AST 33 34  ALT 38 28  ALKPHOS 207* 147*  BILITOT 1.4* 2.3*  PROT 7.9 7.8  ALBUMIN 2.1* 1.9*   No results for input(s): LIPASE, AMYLASE in the last 168 hours. Recent Labs  Lab 08/02/17 1049  AMMONIA 101*   Coagulation Profile: Recent Labs  Lab 07/30/17 0228 08/02/17 0242  INR 1.14 1.25   Cardiac Enzymes: Recent Labs  Lab 07/29/17 1237 07/31/17 0324  CKTOTAL 26* 24*  CKMB 1.6 0.6   BNP (  last 3 results) No results for input(s): PROBNP in the last 8760 hours. HbA1C: No results for input(s): HGBA1C in the last 72 hours. CBG: Recent Labs  Lab 08/03/17 0641 08/03/17 1206 08/03/17 1659 08/03/17 2051 08/04/17 0634  GLUCAP 208* 190* 152* 147* 134*   Lipid Profile: No results for input(s): CHOL, HDL, LDLCALC, TRIG, CHOLHDL, LDLDIRECT in the last 72 hours. Thyroid Function Tests: No results for input(s): TSH, T4TOTAL, FREET4, T3FREE, THYROIDAB in the last 72 hours. Anemia Panel: No results for input(s): VITAMINB12, FOLATE, FERRITIN, TIBC, IRON, RETICCTPCT in the last 72 hours. Urine analysis:    Component Value Date/Time   COLORURINE STRAW (A) 07/28/2017 1330   APPEARANCEUR CLEAR 07/28/2017 1330   LABSPEC 1.010 07/28/2017 1330   PHURINE 5.0 07/28/2017 1330   GLUCOSEU NEGATIVE 07/28/2017 1330   HGBUR NEGATIVE 07/28/2017 1330   BILIRUBINUR NEGATIVE 07/28/2017 1330   KETONESUR NEGATIVE 07/28/2017 1330   PROTEINUR NEGATIVE 07/28/2017 1330   NITRITE NEGATIVE 07/28/2017 1330   LEUKOCYTESUR NEGATIVE 07/28/2017 1330   Sepsis Labs: @LABRCNTIP (procalcitonin:4,lacticidven:4)  ) Recent Results (from the past 240 hour(s))  Culture, blood (x 2)     Status: None   Collection Time: 07/25/17  9:09 PM  Result Value Ref Range Status   Specimen Description BLOOD  RIGHT HAND  Final   Special Requests   Final    BOTTLES DRAWN AEROBIC AND ANAEROBIC Blood Culture adequate volume   Culture   Final    NO GROWTH 5 DAYS Performed at [facility], [address] [place], [place] [address]    Report Status 07/31/2017 FINAL  Final  Culture, blood (x 2)     Status: Abnormal   Collection Time: 07/25/17  9:14 PM  Result Value Ref Range Status   Specimen Description BLOOD RIGHT ANTECUBITAL  Final   Special Requests IN PEDIATRIC BOTTLE Blood Culture adequate volume  Final   Culture  Setup Time   Final    GRAM POSITIVE COCCI IN PEDIATRIC BOTTLE CRITICAL RESULT CALLED TO, READ BACK BY AND VERIFIED WITH: JESSE GRIMSLEY PHARMD AT 2225 ON [id number] BY SJW Performed at [facility], [address], [place], [place] [address]    Culture METHICILLIN RESISTANT STAPHYLOCOCCUS AUREUS (A)  Final   Report Status 07/28/2017 FINAL  Final   Organism ID, Bacteria METHICILLIN RESISTANT STAPHYLOCOCCUS AUREUS  Final      Susceptibility   Methicillin resistant staphylococcus aureus - MIC*    CIPROFLOXACIN >=8 RESISTANT Resistant     ERYTHROMYCIN >=8 RESISTANT Resistant     GENTAMICIN <=0.5 SENSITIVE Sensitive     OXACILLIN >=4 RESISTANT Resistant     TETRACYCLINE <=1 SENSITIVE Sensitive     VANCOMYCIN <=0.5 SENSITIVE Sensitive     TRIMETH/SULFA <=10 SENSITIVE Sensitive     CLINDAMYCIN <=0.25 SENSITIVE Sensitive     RIFAMPIN <=0.5 SENSITIVE Sensitive     Inducible Clindamycin NEGATIVE Sensitive     * METHICILLIN RESISTANT STAPHYLOCOCCUS AUREUS  Blood Culture ID Panel (Reflexed)     Status: Abnormal   Collection Time: 07/25/17  9:14 PM  Result Value Ref Range Status   Enterococcus species NOT DETECTED NOT DETECTED Final   Listeria monocytogenes NOT DETECTED NOT DETECTED Final   Staphylococcus species DETECTED (A) NOT DETECTED Final    Comment: CRITICAL RESULT CALLED TO, READ BACK BY AND VERIFIED WITH: JESSEE GRIMSLEY PHARMD AT 2225 ON [id number] BY SJW     Staphylococcus aureus DETECTED (A) NOT DETECTED Final    Comment: Methicillin (oxacillin)-resistant Staphylococcus  aureus (MRSA). MRSA is predictably resistant to beta-lactam antibiotics (except ceftaroline). Preferred therapy is vancomycin unless clinically contraindicated. Patient requires contact precautions if  hospitalized. CRITICAL RESULT CALLED TO, READ BACK BY AND VERIFIED WITH: JESSE GRIMSLEY PHARMD AT 2225 ON [id number] BY SJW    Methicillin resistance DETECTED (A) NOT DETECTED Final    Comment: CRITICAL RESULT CALLED TO, READ BACK BY AND VERIFIED WITH: JESSE GRIMSLEY PHARMD AT 2225 ON [id number] BY SJW    Streptococcus species NOT DETECTED NOT DETECTED Final   Streptococcus agalactiae NOT DETECTED NOT DETECTED Final   Streptococcus pneumoniae NOT DETECTED NOT DETECTED Final   Streptococcus pyogenes NOT DETECTED NOT DETECTED Final   Acinetobacter baumannii NOT DETECTED NOT DETECTED Final   Enterobacteriaceae species NOT DETECTED NOT DETECTED Final   Enterobacter cloacae complex NOT DETECTED NOT DETECTED Final   Escherichia coli NOT DETECTED NOT DETECTED Final   Klebsiella oxytoca NOT DETECTED NOT DETECTED Final   Klebsiella pneumoniae NOT DETECTED NOT DETECTED Final   Proteus species NOT DETECTED NOT DETECTED Final   Serratia marcescens NOT DETECTED NOT DETECTED Final   Haemophilus influenzae NOT DETECTED NOT DETECTED Final   Neisseria meningitidis NOT DETECTED NOT DETECTED Final   Pseudomonas aeruginosa NOT DETECTED NOT DETECTED Final   Candida albicans NOT DETECTED NOT DETECTED Final   Candida glabrata NOT DETECTED NOT DETECTED Final   Candida krusei NOT DETECTED NOT DETECTED Final   Candida parapsilosis NOT DETECTED NOT DETECTED Final   Candida tropicalis NOT DETECTED NOT DETECTED Final    Comment: Performed at [facility], [address] [place], [place] [address]  MRSA PCR Screening     Status: Abnormal   Collection Time: 07/27/17  6:20 PM  Result Value Ref Range  Status   MRSA by PCR POSITIVE (A) NEGATIVE Final    Comment:        The GeneXpert MRSA Assay (FDA approved for NASAL specimens only), is one component of a comprehensive MRSA colonization surveillance program. It is not intended to diagnose MRSA infection nor to guide or monitor treatment for MRSA infections. RESULT CALLED TO, READ BACK BY AND VERIFIED WITH: WOODY,J RN 12.6.18 @2014  ZANDO,C   Culture, blood (routine x 2)     Status: Abnormal   Collection Time: 07/28/17  7:44 AM  Result Value Ref Range Status   Specimen Description BLOOD LEFT HAND  Final   Special Requests IN PEDIATRIC BOTTLE Blood Culture adequate volume  Final   Culture  Setup Time   Final    GRAM POSITIVE COCCI IN CLUSTERS IN PEDIATRIC BOTTLE CRITICAL VALUE NOTED.  VALUE IS CONSISTENT WITH PREVIOUSLY REPORTED AND CALLED VALUE.    Culture (A)  Final    STAPHYLOCOCCUS AUREUS SUSCEPTIBILITIES PERFORMED ON PREVIOUS CULTURE WITHIN THE LAST 5 DAYS. Performed at [facility], [address] [place], [place] [address]    Report Status 07/30/2017 FINAL  Final  Culture, blood (routine x 2)     Status: Abnormal   Collection Time: 07/28/17  7:45 AM  Result Value Ref Range Status   Specimen Description BLOOD RIGHT HAND  Final   Special Requests IN PEDIATRIC BOTTLE Blood Culture adequate volume  Final   Culture  Setup Time   Final    GRAM POSITIVE COCCI IN CLUSTERS IN PEDIATRIC BOTTLE CRITICAL VALUE NOTED.  VALUE IS CONSISTENT WITH PREVIOUSLY REPORTED AND CALLED VALUE.    Culture (A)  Final    STAPHYLOCOCCUS AUREUS SUSCEPTIBILITIES PERFORMED ON PREVIOUS CULTURE WITHIN THE LAST 5  DAYS. Performed at [facility], [address], [place], [place] [address]    Report Status 07/30/2017 FINAL  Final  Culture, blood (Routine X 2) w Reflex to ID Panel     Status: Abnormal (Preliminary result)   Collection Time: 07/31/17  9:37 AM  Result Value Ref Range Status   Specimen Description BLOOD RIGHT FOREARM   Final   Special Requests   Final    BOTTLES DRAWN AEROBIC AND ANAEROBIC Blood Culture adequate volume   Culture  Setup Time   Final    GRAM POSITIVE COCCI IN CLUSTERS ANAEROBIC BOTTLE ONLY CRITICAL VALUE NOTED.  VALUE IS CONSISTENT WITH PREVIOUSLY REPORTED AND CALLED VALUE.    Culture (A)  Final    STAPHYLOCOCCUS AUREUS SUSCEPTIBILITIES TO FOLLOW Performed at [facility], [address], [place], [place] [address]    Report Status PENDING  Incomplete  Culture, blood (Routine X 2) w Reflex to ID Panel     Status: Abnormal (Preliminary result)   Collection Time: 07/31/17  9:37 AM  Result Value Ref Range Status   Specimen Description RIGHT ANTECUBITAL  Final   Special Requests IN PEDIATRIC BOTTLE Blood Culture adequate volume  Final   Culture  Setup Time   Final    GRAM POSITIVE COCCI IN CLUSTERS IN PEDIATRIC BOTTLE CRITICAL VALUE NOTED.  VALUE IS CONSISTENT WITH PREVIOUSLY REPORTED AND CALLED VALUE. Performed at [facility], [address], [place], [place] [address]    Culture STAPHYLOCOCCUS AUREUS (A)  Final   Report Status PENDING  Incomplete      Studies: No results found.  Scheduled Meds: . chlorhexidine  15 mL Mouth Rinse BID  . enoxaparin (LOVENOX) injection  50 mg Subcutaneous QAC breakfast  . folic acid  1 mg Intravenous Daily  . furosemide  40 mg Intravenous BID  . insulin aspart  0-20 Units Subcutaneous TID AC & HS  . mouth rinse  15 mL Mouth Rinse q12n4p  . metoprolol tartrate  25 mg Oral BID  . potassium chloride  20 mEq Oral Daily  . rifampin  300 mg Oral Q12H  . sodium chloride flush  3 mL Intravenous Q12H  . thiamine  100 mg Intravenous Daily   Or  . thiamine  100 mg Oral Daily    Continuous Infusions: . sodium chloride    . ceFTAROline (TEFLARO) IV Stopped (08/03/17 2220)  . DAPTOmycin (CUBICIN)  IV Stopped (08/03/17 1720)     LOS: 10 days     Darlin Drop, MD [facility] Pager [phone]  If 7PM-7AM, please contact  night-coverage [email or web address] Password [facility] 08/04/2017, 7:24 AM

## 2017-08-04 NOTE — Progress Notes (Signed)
Progress Note  Patient Name: Reginald [name] Date of Encounter: 08/04/2017  Primary Cardiologist: New patient/Dr. Rennis GoldenHilty  Subjective   The patient feels a little stronger today. He walked to the bathroom.   Inpatient Medications    Scheduled Meds: . chlorhexidine  15 mL Mouth Rinse BID  . enoxaparin (LOVENOX) injection  50 mg Subcutaneous QAC breakfast  . folic acid  1 mg Intravenous Daily  . furosemide  40 mg Intravenous BID  . insulin aspart  0-20 Units Subcutaneous TID AC & HS  . mouth rinse  15 mL Mouth Rinse q12n4p  . metoprolol tartrate  25 mg Oral BID  . potassium chloride  20 mEq Oral Daily  . rifampin  300 mg Oral Q12H  . sodium chloride flush  3 mL Intravenous Q12H  . thiamine  100 mg Intravenous Daily   Or  . thiamine  100 mg Oral Daily   Continuous Infusions: . sodium chloride    . ceFTAROline (TEFLARO) IV Stopped (08/04/17 0935)  . DAPTOmycin (CUBICIN)  IV Stopped (08/03/17 1720)   PRN Meds: sodium chloride, acetaminophen, hydrALAZINE, ipratropium-albuterol, [DISCONTINUED] ondansetron **OR** ondansetron (ZOFRAN) IV, sodium chloride flush   Vital Signs    Vitals:   08/03/17 1940 08/03/17 2334 08/04/17 0335 08/04/17 0345  BP: 114/70 114/69 (!) 145/77   Pulse: 84 72 83 80  Resp: 19 (!) 21 18 (!) 25  Temp: 98 F (36.7 C) 98 F (36.7 C) 97.6 F (36.4 C)   TempSrc: Oral  Oral   SpO2: 92% 91% 96% 98%  Weight:    237 lb 7 oz (107.7 kg)  Height:        Intake/Output Summary (Last 24 hours) at 08/04/2017 1106 Last data filed at 08/04/2017 0839 Gross per 24 hour  Intake 1550 ml  Output 2800 ml  Net -1250 ml   Filed Weights   08/02/17 0500 08/03/17 0415 08/04/17 0345  Weight: 239 lb 13.8 oz (108.8 kg) 237 lb 3.4 oz (107.6 kg) 237 lb 7 oz (107.7 kg)    Physical Exam   General: Well developed, well nourished, NAD Skin: Warm, dry, intact  Head: Normocephalic, atraumatic, sclera non-icteric, no xanthomas, clear, moist mucus membranes. Neck:  Negative for carotid bruits. No JVD Lungs:Clear to ausculation bilaterally. No wheezes, rales, or rhonchi. Breathing is unlabored. Cardiovascular: RRR with S1 S2. No murmurs, rubs, gallops, or LV heave appreciated. Abdomen: Soft, non-tender, non-distended with normoactive bowel sounds. No hepatomegaly, No rebound/guarding. No obvious abdominal masses. MSK: Strength and tone appear normal for age. 5/5 in all extremities Extremities: No edema. No clubbing or cyanosis. DP/PT pulses 2+ bilaterally Neuro: Alert and oriented. No focal deficits. No facial asymmetry. MAE spontaneously. Psych: Responds to questions appropriately with normal affect.     Labs    Chemistry Recent Labs  Lab 07/30/17 0228 08/02/17 0455 08/03/17 0304 08/04/17 0231  NA 143 140 139 135  K 4.1 3.6 3.6 3.4*  CL 106 106 106 103  CO2 31 27 28 25   GLUCOSE 190* 197* 133* 120*  BUN 42* 34* 31* 27*  CREATININE 0.79 0.83 0.84 0.85  CALCIUM 9.0 9.0 8.6* 8.7*  PROT 7.9  --  7.8  --   ALBUMIN 2.1*  --  1.9*  --   AST 33  --  34  --   ALT 38  --  28  --   ALKPHOS 207*  --  147*  --   BILITOT 1.4*  --  2.3*  --  GFRNONAA >60 >60 >60 >60  GFRAA >60 >60 >60 >60  ANIONGAP 6 7 5 7      Hematology Recent Labs  Lab 08/02/17 0710 08/03/17 0304 08/04/17 0231  WBC 11.2* 8.1 7.2  RBC 3.57* 2.94* 2.86*  HGB 11.1* 9.1* 8.7*  HCT 35.3* 28.9* 27.3*  MCV 98.9 98.3 95.5  MCH 31.1 31.0 30.4  MCHC 31.4 31.5 31.9  RDW 14.2 13.9 13.7  PLT 351 286 277    Cardiac EnzymesNo results for input(s): TROPONINI in the last 168 hours. No results for input(s): TROPIPOC in the last 168 hours.   BNPNo results for input(s): BNP, PROBNP in the last 168 hours.   DDimer No results for input(s): DDIMER in the last 168 hours.   Radiology    No results found.  Telemetry    08/04/17-NSR - Personally Reviewed  ECG     - Personally Reviewed  Cardiac Studies   Right and Left Cardiac Catheterization 08/02/2017:   Mid RCA lesion is  40% stenosed.  Ost 2nd Mrg to 2nd Mrg lesion is 20% stenosed.  Hemodynamic findings consistent with moderate pulmonary hypertension.   1. Mild non-obstructive CAD  Recommendations: Medical management of CAD. Continue planning for MVR.   Echo TEE 07/27/2017: Study Conclusions  - Left ventricle: Normal LV size. Moderate LVH. Systolic function   was normal. The estimated ejection fraction was in the range of   60% to 65%. Wall motion was normal; there were no regional wall   motion abnormalities. - Aortic valve: Trileaflet. Small echogenic mass at the tip of the   non-coronary cusp. lambl&'s excrescences. Trivial AI. - Aorta: Grade 3 mobile atheroma of the proximal aortic arch. - Mitral valve: Large 2-3 cm mass of the A3 segment of the anterior   leaflet, prolapsing across the posterior leaflet. There is a   distinct posterior jet which is centrally directed of severe   regurgitation, suggestive of perforation. There is dropout seen   through the vegetation or in the area of the P3 scallop - not   entirely clear, but concerning for possible perforation. There is   vegetation to a lesser extent on the posterior leaflet tip.   Effective regurgitant orifice (PISA): 1.01 cm^2. Regurgitant   volume (PISA): 155 ml. - Left atrium: The atrium was dilated. No evidence of thrombus in   the atrial cavity or appendage. - Right atrium: No evidence of thrombus in the atrial cavity or   appendage. - Atrial septum: No defect or patent foramen ovale was identified. - Pulmonic valve: No evidence of vegetation.  Impressions:  - Large mass of the A3 segment of the anterior mitral leaflet,   prolapsing over the posterior leaflet. Posteriorly located   central regurgitant jet suggestive of possible perforation - in   the region of the P3 segment. Endocarditis of the posterior   leaflet tip is noted. There is a possible small mass on the   non-coronary cusp of the aortic valve with trivial AI -  this   could be resolved endocarditis or sclerosis as well.   Patient Profile     56 y.o. 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  Assessment & Plan    1. CHF: -he is diuresing well but still fluid overloaded, increase  Lasix to 40mg  IV TID -BUN/Cr stable  - replace potassium -Neg 1.2L today, neg net  14L since admission -Weight today 237lb, slowly improving. Admission weight 264lb on 07/25/2017 -Right and Left heart cath 08/02/17 with no significant coronary disease noted.   2. Severe MR: will eventually need MV replacement.BCx still positive 12/10, CT surgery until BCx are sterile.  3. MRSA Endocarditis:  has virtually all the poor predictors of recurrent embolism (MV involvement, anterior leaflet, mobile and large vegetation, already embolized). High risk for more strokes. Would proceed with surgery sooner rather than later, but need sterile BCx first. Still positive yesterday,  ID follows, vancomycin was switched to daptomycin, rifampin added, he had a CT chest/abdomen/pelvis to look for another possible source yesterday with no significant findings. Left heart catheterization showed non-obstructive CAD.  Plan for teeth extraction on Monday, surgery once BC negative, and he is stronger. BC still positive on 12/10. BC from 12/13 are pending. We will start physical therapy.  4. Hypertension: improved  Tobias AlexanderKatarina Wallace Cogliano, MD 08/04/2017 For questions or updates, please contact   Please consult [email or web address] for contact info under Cardiology/STEMI.

## 2017-08-04 NOTE — Progress Notes (Signed)
Pharmacy Antibiotic Note  [name] Jaffe is a 56 y.o. male admitted on 07/25/2017 with MRSA bacteremia and endocarditis.  Pharmacy has been consulted for Vancomycin dosing.  Blood cx have remained positive, ID has changed vancomycin to daptomycin per pharmacy. Pt is afebrile and WBC is WNL. SCr is WNL and CK is ok.   Plan: Continue daptomycin 1gm IV Q24H F/u renal fxn, C&S, clinical status and planned LOT Continue ceftaroline per MD Weekly CK - due 12/17   Height: 5\' 9"  (175.3 cm) Weight: 237 lb 7 oz (107.7 kg) IBW/kg (Calculated) : 70.7  Temp (24hrs), Avg:98 F (36.7 C), Min:97.6 F (36.4 C), Max:98.2 F (36.8 C)  Recent Labs  Lab 07/29/17 0233 07/29/17 0637 07/29/17 1827 07/30/17 0228 07/31/17 0324 08/01/17 0344 08/02/17 0455 08/02/17 0710 08/03/17 0304 08/04/17 0231  WBC 10.4  --   --  10.7* 10.9* 10.2  --  11.2* 8.1 7.2  CREATININE 0.69  --   --  0.79  --   --  0.83  --  0.84 0.85  LATICACIDVEN  --   --   --  1.0  --   --   --   --   --   --   VANCOTROUGH 38*  --   --  21*  --   --   --   --   --   --   VANCOPEAK  --  38 37  --   --   --   --   --   --   --     Estimated Creatinine Clearance: 116 mL/min (by C-G formula based on SCr of 0.85 mg/dL).    Allergies  Allergen Reactions  . Morphine And Related     Antimicrobials this admission: 12/4 [facility] >> 12/10 12/10 daptomycin >> 12/10 ceftaroline >> 12/12 rifampin>>  Dose adjustments this admission: 12/7 1800 Vancomycin peak level: 42 12/8 0230 Vancomycin trough level: 38  Microbiology results: [facility]29[facility] BCx: MRSA (S bactrim, vanc, dapto, linezolid, TCN) - per Dr. Feliz BeamSnider's note 12/4 BCx: GPC (BCID = MRSA) 12/5 HIV Ab: negative 12/6 MRSA PCR: positive 12/7 BCx: S. aureus  Thank you for allowing pharmacy to be a part of this patient's care.  Lysle Pearlachel Divya Munshi, PharmD, BCPS Phone #: [phone] until 3:30pm All other times, call Main Pharmacy x  [phone] 08/04/2017 10:09 AM

## 2017-08-04 NOTE — Progress Notes (Signed)
Physical Therapy Treatment Patient Details Name: Reginald [name] MRN: [id number] DOB: 06/05/1961 Today's Date: 08/04/2017    History of Present Illness 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 Pt developed agitation on 12/5 thought to be ETOH withdrawal. MRI on 12/5 done and showed multiple areas bilaterally of septic emboli. Pt with severe encephalopathy and respiratory failure and was intubated on 12/6. Pt extubated on 12/10. PMH - DM, ETOH, obesity    PT Comments    Pt performed supine exercises as he reports he has just returned to bed.  Pt fatigues quickly with physical activity.  Plan next session for progression to gait training.  Plan remains appropriate for CIR for intense rehab before returning home.      Follow Up Recommendations  CIR     Equipment Recommendations  Rolling walker with 5" wheels    Recommendations for Other Services OT consult     Precautions / Restrictions Precautions Precautions: Fall Restrictions Weight Bearing Restrictions: No    Mobility  Bed Mobility Overal bed mobility: Needs Assistance             General bed mobility comments: Assist to boost in supine.  Pt required cues for hand placement and bed placed in trendelenberg position.   Transfers Overall transfer level: Needs assistance Equipment used: Rolling walker (2 wheeled) Transfers: Sit to/from Stand Sit to Stand: +2 physical assistance;Mod assist(with nursing staff for safety)         General transfer comment: verbal cues for correct hand placement, pt encouraged to sit up in recliner vs returning to bed as he requested  Ambulation/Gait                 Stairs            Wheelchair Mobility    Modified Rankin (Stroke Patients Only)       Balance Overall balance assessment: Needs assistance Sitting-balance support: No upper extremity supported;Feet supported Sitting balance-Leahy  Scale: Fair     Standing balance support: Bilateral upper extremity supported;Single extremity supported;During functional activity Standing balance-Leahy Scale: Poor                              Cognition Arousal/Alertness: Awake/alert Behavior During Therapy: WFL for tasks assessed/performed Overall Cognitive Status: Impaired/Different from baseline Area of Impairment: Attention;Safety/judgement;Following commands;Problem solving                   Current Attention Level: Sustained Memory: Decreased short-term memory Following Commands: Follows one step commands consistently;Follows one step commands with increased time Safety/Judgement: Decreased awareness of safety;Decreased awareness of deficits   Problem Solving: Slow processing;Difficulty sequencing;Requires verbal cues;Requires tactile cues        Exercises General Exercises - Lower Extremity Ankle Circles/Pumps: AROM;Both;10 reps;Supine Quad Sets: AROM;Both;10 reps;Supine Heel Slides: AROM;Both;10 reps;Supine Hip ABduction/ADduction: AROM;Both;10 reps;Supine Straight Leg Raises: AROM;Both;10 reps;Supine    General Comments        Pertinent Vitals/Pain Pain Assessment: 0-10 Pain Score: 6  Pain Location: neck Pain Descriptors / Indicators: Aching Pain Intervention(s): Monitored during session;Repositioned    Home Living Family/patient expects to be discharged to:: Private residence Living Arrangements: Spouse/significant other Available Help at Discharge: Family Type of Home: House Home Access: Level entry   Home Layout: One level Home Equipment: None      Prior Function Level of Independence: Independent  PT Goals (current goals can now be found in the care plan section) Acute Rehab PT Goals Patient Stated Goal: go home Potential to Achieve Goals: Good Progress towards PT goals: Progressing toward goals    Frequency    Min 3X/week      PT Plan Current plan  remains appropriate    Co-evaluation              AM-PAC PT "6 Clicks" Daily Activity  Outcome Measure  Difficulty turning over in bed (including adjusting bedclothes, sheets and blankets)?: Unable Difficulty moving from lying on back to sitting on the side of the bed? : Unable Difficulty sitting down on and standing up from a chair with arms (e.g., wheelchair, bedside commode, etc,.)?: Unable Help needed moving to and from a bed to chair (including a wheelchair)?: A Lot Help needed walking in hospital room?: A Lot Help needed climbing 3-5 steps with a railing? : Total 6 Click Score: 8    End of Session Equipment Utilized During Treatment: Gait belt Activity Tolerance: Patient limited by fatigue Patient left: in bed;with call bell/phone within reach;with bed alarm set   PT Visit Diagnosis: Unsteadiness on feet (R26.81);Other abnormalities of gait and mobility (R26.89);Muscle weakness (generalized) (M62.81)     Time: [id number] PT Time Calculation (min) (ACUTE ONLY): 15 min  Charges:  $Therapeutic Exercise: 8-22 mins                    G Codes:       Joycelyn Ruaimee Caren Garske, PTA pager [phone]    Florestine Aversimee J Tawan Degroote 08/04/2017, 2:34 PM

## 2017-08-04 NOTE — Progress Notes (Signed)
Rehab admissions - I am following for potential acute inpatient rehab admission.  Noted plans for dental extractions on 08/07/17.  I will follow up for progress and plans next week.  Call me for questions.  #[phone]

## 2017-08-04 NOTE — Plan of Care (Signed)
  Completed/Met SLP Dysphagia Goals Patient will utilize recommended strategies Description Patient will utilize recommended strategies during swallow to increase swallowing safety with 08/04/2017 0923 - Completed/Met by Lorre Nick, CCC-SLP Patient will demonstrate readiness for PO's Description Patient will demonstrate readiness for PO's and/or instrumental swallow study as evidenced by: 08/04/2017 0923 - Completed/Met by Colon Flattery B, CCC-SLP

## 2017-08-04 NOTE — Plan of Care (Signed)
  Goals inadvertently not set by evaluating therapist.  Goals added 08/04/17 Acute Rehab PT Goals(only PT should resolve) Pt Will Go Supine/Side To Sit 08/04/2017 1333 by Olivia CanterMoton, Troi Florendo M, PT Flowsheets Taken 08/04/2017 1333  Pt will go Supine/Side to Sit with minimal assist Pt Will Go Sit To Supine/Side 08/04/2017 1333 by Olivia CanterMoton, Dandrea Widdowson M, PT Flowsheets Taken 08/04/2017 1333  Pt will go Sit to Supine/Side with minimal assist Patient Will Transfer Sit To/From Stand 08/04/2017 1333 by Olivia CanterMoton, Printice Hellmer M, PT Flowsheets Taken 08/04/2017 1333  Patient will transfer sit to/from stand with minimal assist Pt Will Ambulate 08/04/2017 1333 by Olivia CanterMoton, Makhari Dovidio M, PT Flowsheets Taken 08/04/2017 1333  Pt will Ambulate 75 feet;with minimal assist;with rolling walker  08/04/2017 [place]ie Felicita Nuncio, PT [phone]

## 2017-08-04 NOTE — Evaluation (Signed)
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 Pt developed agitation on 12/5 thought to be ETOH withdrawal. MRI on 12/5 done and showed multiple areas bilaterally of septic emboli. Pt with severe encephalopathy and respiratory failure and was intubated on 12/6. Pt extubated on 12/10. PMH - DM, ETOH, obesity   Clinical Impression   Pt with decline in function and safety with ADLs and ADL mobility with decreased strength, balance and endurance. Pt would benefit from acute OT services to address impairments to maximize level of function and safety    Follow Up Recommendations  CIR    Equipment Recommendations  Other (comment)(TBD at next level of care)    Recommendations for Other Services       Precautions / Restrictions Precautions Precautions: Fall Restrictions Weight Bearing Restrictions: No      Mobility Bed Mobility               General bed mobility comments: pt with nursing staff in bathroom upon OT arrival  Transfers Overall transfer level: Needs assistance Equipment used: Rolling walker (2 wheeled) Transfers: Sit to/from Stand Sit to Stand: +2 physical assistance;Mod assist(with nursing staff for safety)         General transfer comment: verbal cues for correct hand placement, pt encouraged to sit up in recliner vs returning to bed as he requested    Balance Overall balance assessment: Needs assistance Sitting-balance support: No upper extremity supported;Feet supported Sitting balance-Leahy Scale: Fair     Standing balance support: Bilateral upper extremity supported;Single extremity supported;During functional activity Standing balance-Leahy Scale: Poor                             ADL either performed or assessed with  clinical judgement   ADL Overall ADL's : Needs assistance/impaired     Grooming: Wash/dry hands;Wash/dry face;Standing;Minimal assistance Grooming Details (indicate cue type and reason): Poor balance, unsteady Upper Body Bathing: Min guard;Sitting   Lower Body Bathing: Maximal assistance   Upper Body Dressing : Min guard;Sitting   Lower Body Dressing: Maximal assistance   Toilet Transfer: Ambulation;RW;Comfort height toilet;Moderate assistance;+2 for safety/equipment;Cueing for safety   Toileting- Clothing Manipulation and Hygiene: Moderate assistance;Sit to/from stand   Tub/ Engineer, structuralhower Transfer: Moderate assistance;+2 for safety/equipment;Rolling walker;Ambulation;3 in 1;Grab bars   Functional mobility during ADLs: Moderate assistance;+2 for safety/equipment;Rolling walker;Cueing for safety General ADL Comments: verbal cues for correct hand placement, pt encouraged to sit up in recliner vs returning to bed as he requested     Vision Baseline Vision/History: Wears glasses Wears Glasses: Reading only Patient Visual Report: No change from baseline       Perception     Praxis      Pertinent Vitals/Pain Pain Assessment: 0-10 Pain Score: 6  Pain Location: neck Pain Descriptors / Indicators: Aching Pain Intervention(s): Monitored during session;Repositioned;Premedicated before session     Hand Dominance Right   Extremity/Trunk Assessment Upper Extremity Assessment Upper Extremity Assessment: Generalized weakness   Lower Extremity Assessment Lower Extremity Assessment: Defer to PT evaluation   Cervical / Trunk Assessment Cervical / Trunk Assessment: Normal   Communication Communication Communication: No difficulties   Cognition Arousal/Alertness: Awake/alert Behavior During Therapy: WFL for tasks assessed/performed Overall Cognitive Status: Impaired/Different from baseline Area of Impairment: Attention;Safety/judgement;Following commands;Problem solving  Memory: Decreased short-term memory Following Commands: Follows one step commands consistently;Follows one step commands with increased time Safety/Judgement: Decreased awareness of safety;Decreased awareness of deficits   Problem Solving: Slow processing;Difficulty sequencing;Requires verbal cues;Requires tactile cues     General Comments       Exercises     Shoulder Instructions      Home Living Family/patient expects to be discharged to:: Private residence Living Arrangements: Spouse/significant other Available Help at Discharge: Family Type of Home: House Home Access: Level entry     Home Layout: One level     Bathroom Shower/Tub: Tub/shower unit;Walk-in shower   Bathroom Toilet: Standard     Home Equipment: None          Prior Functioning/Environment Level of Independence: Independent                 OT Problem List: Decreased strength;Decreased activity tolerance;Impaired balance (sitting and/or standing);Decreased safety awareness;Decreased knowledge of precautions;Pain;Decreased knowledge of use of DME or AE      OT Treatment/Interventions: Self-care/ADL training;DME and/or AE instruction;Therapeutic activities;Therapeutic exercise;Patient/family education    OT Goals(Current goals can be found in the care plan section) Acute Rehab OT Goals Patient Stated Goal: go home OT Goal Formulation: With patient Time For Goal Achievement: 08/18/17 Potential to Achieve Goals: Good ADL Goals Pt Will Perform Grooming: with min guard assist;with supervision;standing Pt Will Perform Upper Body Bathing: with supervision;with set-up Pt Will Perform Lower Body Bathing: with mod assist Pt Will Perform Upper Body Dressing: with supervision Pt Will Perform Lower Body Dressing: with mod assist Pt Will Transfer to Toilet: with min assist;with min guard assist;ambulating;regular height toilet;grab bars Pt Will Perform Toileting - Clothing Manipulation  and hygiene: with min assist;sit to/from stand Pt Will Perform Tub/Shower Transfer: with min assist;ambulating;rolling walker;3 in 1;grab bars  OT Frequency: Min 2X/week   Barriers to D/C: Decreased caregiver support          Co-evaluation              AM-PAC PT "6 Clicks" Daily Activity     Outcome Measure Help from another person eating meals?: None(NPO) Help from another person taking care of personal grooming?: A Little Help from another person toileting, which includes using toliet, bedpan, or urinal?: A Lot Help from another person bathing (including washing, rinsing, drying)?: A Lot Help from another person to put on and taking off regular upper body clothing?: A Little Help from another person to put on and taking off regular lower body clothing?: A Lot 6 Click Score: 16   End of Session Equipment Utilized During Treatment: Gait belt;Rolling walker  Activity Tolerance: Patient tolerated treatment well Patient left: in chair;with call bell/phone within reach;with bed alarm set  OT Visit Diagnosis: Unsteadiness on feet (R26.81);Muscle weakness (generalized) (M62.81);Other symptoms and signs involving cognitive function;Pain Pain - Right/Left: (bilateral) Pain - part of body: (neck)                Time: 9147-82951156-1221 OT Time Calculation (min): 25 min Charges:  OT General Charges $OT Visit: 1 Visit OT Evaluation $OT Eval Moderate Complexity: 1 Mod OT Treatments $Therapeutic Activity: 8-22 mins G-Codes: OT G-codes **NOT FOR INPATIENT CLASS** Functional Assessment Tool Used: AM-PAC 6 Clicks Daily Activity     Galen ManilaSpencer, Reginald [name] Reginald [name] 08/04/2017, 1:43 PM

## 2017-08-05 LAB — CBC
HEMATOCRIT: 28.9 % — AB (ref 39.0–52.0)
HEMOGLOBIN: 9.5 g/dL — AB (ref 13.0–17.0)
MCH: 31.7 pg (ref 26.0–34.0)
MCHC: 32.9 g/dL (ref 30.0–36.0)
MCV: 96.3 fL (ref 78.0–100.0)
Platelets: 313 10*3/uL (ref 150–400)
RBC: 3 MIL/uL — AB (ref 4.22–5.81)
RDW: 14 % (ref 11.5–15.5)
WBC: 7.1 10*3/uL (ref 4.0–10.5)

## 2017-08-05 LAB — GLUCOSE, CAPILLARY
GLUCOSE-CAPILLARY: 136 mg/dL — AB (ref 65–99)
Glucose-Capillary: 166 mg/dL — ABNORMAL HIGH (ref 65–99)
Glucose-Capillary: 151 mg/dL — ABNORMAL HIGH (ref 65–99)

## 2017-08-05 LAB — TRIGLYCERIDES: Triglycerides: 366 mg/dL — ABNORMAL HIGH (ref <150)

## 2017-08-05 MED ORDER — POTASSIUM CHLORIDE CRYS ER 20 MEQ PO TBCR
40.0000 meq | EXTENDED_RELEASE_TABLET | Freq: Once | ORAL | Status: AC
  Administered 2017-08-05: 40 meq via ORAL
  Filled 2017-08-05: qty 2

## 2017-08-05 NOTE — Progress Notes (Signed)
PROGRESS NOTE  Reginald [name] Proto WUJ:[id number] DOB: 03/20/1961 DOA: 07/25/2017 PCP: Patient, No Pcp Per  HPI/Recap of past 5824 hours:  56 year old male with past medical history significant for COPD, diabetes mellitus, alcohol abuse, diastolic CHF, and AAA who was initially admitted to [facility] on 07/20/17 with nonspecific complaints of generalized weakness, low-grade fever, and muscle aches.  Blood cultures done at admission grew out MRSA and he was started on vancomycin.  2D echocardiogram at that time demonstrated a mitral valve vegetation.  ICU transfer at [facility] for Precedex infusion with presumed alcohol withdrawal was weaned off on 12/3.  Transferred to [facility] for infectious diseases consultation.  On 12/4 found to have a new facial droop.  MRI brain revealed multiple infarcts to both cerebral hemispheres consistent with septic emboli. Transferred to [facility] to continue workup and treatment. 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   Dental surgeon planning extraction of remaining teeth with alveoloplasty and pre-prosthetic surgery in the operating room with general anesthesia for Monday, 08/07/2017 at 7:30 AM if repeated blood cultures negative.  Blood culture x 2 (08/03/17) no growth in less than 24 hours (08/05/17). On IV ceftaroline day# 5, IV daptomycin day # 3, IV rifampin day # 3.  No acute events reported overnight. Pt seen and examined with no family member at bedside.Reports he feels a little weak. Admits to neck pain which is chronic 4-5/10 dull non radiating and worse when he extends his neck. Denies chest pain or dyspnea. Has no other complaints.   Assessment/Plan: Principal Problem:   MRSA bacteremia Active Problems:   Mitral valve vegetation   Acute respiratory failure (HCC)   Alcohol abuse   Thrombocytopenia (HCC)   Abnormal liver enzymes   Acute  encephalopathy   Acute diastolic CHF (congestive heart failure) (HCC)   Sepsis (HCC)   Endocarditis of mitral valve   Cerebral septic emboli (HCC)   Hypertensive heart disease with heart failure (HCC)   Endotracheal tube present   Acute respiratory disease   Poor dentition  AMS, resolved, possibly 2/2 to septic emboli -ID following  MRSA bacteremia  -possible source of infection is oral from severe dental caries-dental surgery following -blood cultures positive 07/25/17, 07/28/17, 07/31/17 -IV ceftaroline, IV daptomycin day #10 of IV antibiotics -repeat blood cx x2 (08/03/17) no growth less than 24 hours (08/05/17) -ID following. We appreciate recommendations -afebrile no leukocytosis -cbc am  MV endocarditis -ID, cardiology following -may require M valve replacement. Defer to cardiology -c/w IV antibiotics -right/left heart cath 08/02/17 -TEE 07/27/17: Impressions: Large mass of the A3 segment of the anterior mitral leaflet, prolapsing over the posterior leaflet. Posteriorly located central regurgitant jet suggestive of possible perforation - in the region of the P3 segment. Endocarditis of the posterior leaflet tip is noted. There is a possible small mass on the non-coronary cusp of the aortic valve with trivial AI - this could be resolved endocarditis or sclerosis as well.  CNS, septic emboli -management as stated above  Possible multifocal pneumonia on CT, poa -continue IV antibiotics  Poor dentition -dental surgeon following -planning extraction of remaining teeth with alveoloplasty and pre-prosthetic surgery in the operating room with general anesthesia for Monday, 08/07/2017 at 7:30 AM.   -we appreciate recommendations  Chronic normocytic anemia -stable -hg 9.5 from 8.7 from 9.1 from 11.2 baseline hg 11 -mcv 96 (08/05/17) -no sign of overt bleeding -cbc  am   Code Status: Full  Family Communication: No family members at bedside  Disposition  Plan: will stay another midnight to continue IV antibiotics. Possible dental extraction of remaining teeth with alveoloplasty and pre-prosthetic surgery in the operating room with general anesthesia for Monday, 08/07/2017 at 7:30 AM if blood culture negative.     Consultants:  ID  Cardiology  Dental surgery  Procedures:  right/left [facility] 08/02/17  Antimicrobials:  IV daptomycin  IV rifampin  IV ceftaroline  DVT prophylaxis:  sq lovenox 50 mg daily   Objective: Vitals:   08/04/17 2350 08/05/17 0232 08/05/17 0500 08/05/17 0749  BP: 119/64 124/64  102/63  Pulse:      Resp:      Temp: 98.9 F (37.2 C) 98.7 F (37.1 C)    TempSrc: Oral Oral  Oral  SpO2:      Weight:   110 kg (242 lb 8.1 oz)   Height:        Intake/Output Summary (Last 24 hours) at 08/05/2017 0750 Last data filed at 08/05/2017 0709 Gross per 24 hour  Intake 950 ml  Output 2500 ml  Net -1550 ml   Filed Weights   08/03/17 0415 08/04/17 0345 08/05/17 0500  Weight: 107.6 kg (237 lb 3.4 oz) 107.7 kg (237 lb 7 oz) 110 kg (242 lb 8.1 oz)    Exam:   General:  56 yo CM WD WN alert and oriented x 3  Cardiovascular: RRR no rubs or gallops  Respiratory: CTA no wheezes or rhonchi  Abdomen: soft NT ND NBS x4   Musculoskeletal: Moves all 4 extremities. Non focal  Skin: no noted open ulcerative lesions  Psychiatry: mood is appropriate for condition and setting   Data Reviewed: CBC: Recent Labs  Lab 07/30/17 0228 07/31/17 0324 08/01/17 0344 08/02/17 0710 08/03/17 0304 08/04/17 0231  WBC 10.7* 10.9* 10.2 11.2* 8.1 7.2  NEUTROABS 8.9* 9.1* 8.7* 9.9*  --   --   HGB 10.2* 10.1* 11.2* 11.1* 9.1* 8.7*  HCT 32.1* 32.3* 35.5* 35.3* 28.9* 27.3*  MCV 98.5 100.0 99.4 98.9 98.3 95.5  PLT 222 252 PLATELET CLUMPS NOTED ON SMEAR, COUNT APPEARS ADEQUATE 351 286 277   Basic Metabolic Panel: Recent Labs  Lab 07/30/17 0228 07/31/17 0324 08/01/17 0344 08/02/17 0455 08/03/17 0304 08/04/17 0231  NA  143  --   --  140 139 135  K 4.1  --   --  3.6 3.6 3.4*  CL 106  --   --  106 106 103  CO2 31  --   --  27 28 25   GLUCOSE 190*  --   --  197* 133* 120*  BUN 42*  --   --  34* 31* 27*  CREATININE 0.79  --   --  0.83 0.84 0.85  CALCIUM 9.0  --   --  9.0 8.6* 8.7*  MG 1.9 2.2 2.1  --   --   --   PHOS 4.2 4.5 3.5  --   --   --    GFR: Estimated Creatinine Clearance: 117.2 mL/min (by C-G formula based on SCr of 0.85 mg/dL). Liver Function Tests: Recent Labs  Lab 07/30/17 0228 08/03/17 0304  AST 33 34  ALT 38 28  ALKPHOS 207* 147*  BILITOT 1.4* 2.3*  PROT 7.9 7.8  ALBUMIN 2.1* 1.9*   No results for input(s): LIPASE, AMYLASE in the last 168 hours. Recent Labs  Lab 08/02/17 1049  AMMONIA 101*   Coagulation Profile: Recent Labs  Lab 07/30/17 0228 08/02/17 0242  INR 1.14 1.25   Cardiac Enzymes: Recent Labs  Lab 07/29/17 1237 07/31/17 0324  CKTOTAL 26* 24*  CKMB 1.6 0.6   BNP (last 3 results) No results for input(s): PROBNP in the last 8760 hours. HbA1C: No results for input(s): HGBA1C in the last 72 hours. CBG: Recent Labs  Lab 08/03/17 1659 08/03/17 2051 08/04/17 0634 08/04/17 1128 08/04/17 1701  GLUCAP 152* 147* 134* 156* 135*   Lipid Profile: No results for input(s): CHOL, HDL, LDLCALC, TRIG, CHOLHDL, LDLDIRECT in the last 72 hours. Thyroid Function Tests: No results for input(s): TSH, T4TOTAL, FREET4, T3FREE, THYROIDAB in the last 72 hours. Anemia Panel: No results for input(s): VITAMINB12, FOLATE, FERRITIN, TIBC, IRON, RETICCTPCT in the last 72 hours. Urine analysis:    Component Value Date/Time   COLORURINE STRAW (A) 07/28/2017 1330   APPEARANCEUR CLEAR 07/28/2017 1330   LABSPEC 1.010 07/28/2017 1330   PHURINE 5.0 07/28/2017 1330   GLUCOSEU NEGATIVE 07/28/2017 1330   HGBUR NEGATIVE 07/28/2017 1330   BILIRUBINUR NEGATIVE 07/28/2017 1330   KETONESUR NEGATIVE 07/28/2017 1330   PROTEINUR NEGATIVE 07/28/2017 1330   NITRITE NEGATIVE 07/28/2017 1330     LEUKOCYTESUR NEGATIVE 07/28/2017 1330   Sepsis Labs: @LABRCNTIP (procalcitonin:4,lacticidven:4)  ) Recent Results (from the past 240 hour(s))  MRSA PCR Screening     Status: Abnormal   Collection Time: 07/27/17  6:20 PM  Result Value Ref Range Status   MRSA by PCR POSITIVE (A) NEGATIVE Final    Comment:        The GeneXpert MRSA Assay (FDA approved for NASAL specimens only), is one component of a comprehensive MRSA colonization surveillance program. It is not intended to diagnose MRSA infection nor to guide or monitor treatment for MRSA infections. RESULT CALLED TO, READ BACK BY AND VERIFIED WITH: WOODY,J RN 12.6.18 @2014  ZANDO,C   Culture, blood (routine x 2)     Status: Abnormal   Collection Time: 07/28/17  7:44 AM  Result Value Ref Range Status   Specimen Description BLOOD LEFT HAND  Final   Special Requests IN PEDIATRIC BOTTLE Blood Culture adequate volume  Final   Culture  Setup Time   Final    GRAM POSITIVE COCCI IN CLUSTERS IN PEDIATRIC BOTTLE CRITICAL VALUE NOTED.  VALUE IS CONSISTENT WITH PREVIOUSLY REPORTED AND CALLED VALUE.    Culture (A)  Final    STAPHYLOCOCCUS AUREUS SUSCEPTIBILITIES PERFORMED ON PREVIOUS CULTURE WITHIN THE LAST 5 DAYS. Performed at [facility], [address], [place], [place] [address]    Report Status 07/30/2017 FINAL  Final  Culture, blood (routine x 2)     Status: Abnormal   Collection Time: 07/28/17  7:45 AM  Result Value Ref Range Status   Specimen Description BLOOD RIGHT HAND  Final   Special Requests IN PEDIATRIC BOTTLE Blood Culture adequate volume  Final   Culture  Setup Time   Final    GRAM POSITIVE COCCI IN CLUSTERS IN PEDIATRIC BOTTLE CRITICAL VALUE NOTED.  VALUE IS CONSISTENT WITH PREVIOUSLY REPORTED AND CALLED VALUE.    Culture (A)  Final    STAPHYLOCOCCUS AUREUS SUSCEPTIBILITIES PERFORMED ON PREVIOUS CULTURE WITHIN THE LAST 5 DAYS. Performed at [facility], [address] [place], [place] [address]     Report Status 07/30/2017 FINAL  Final  Culture, blood (Routine X 2) w Reflex to ID Panel     Status: Abnormal   Collection Time: 07/31/17  9:37 AM  Result Value Ref Range Status  Specimen Description BLOOD RIGHT FOREARM  Final   Special Requests   Final    BOTTLES DRAWN AEROBIC AND ANAEROBIC Blood Culture adequate volume   Culture  Setup Time   Final    GRAM POSITIVE COCCI IN CLUSTERS ANAEROBIC BOTTLE ONLY CRITICAL VALUE NOTED.  VALUE IS CONSISTENT WITH PREVIOUSLY REPORTED AND CALLED VALUE. Performed at [facility], [address] [place], [place] [address]    Culture METHICILLIN RESISTANT STAPHYLOCOCCUS AUREUS (A)  Final   Report Status 08/04/2017 FINAL  Final   Organism ID, Bacteria METHICILLIN RESISTANT STAPHYLOCOCCUS AUREUS  Final      Susceptibility   Methicillin resistant staphylococcus aureus - MIC*    CIPROFLOXACIN >=8 RESISTANT Resistant     ERYTHROMYCIN >=8 RESISTANT Resistant     GENTAMICIN <=0.5 SENSITIVE Sensitive     OXACILLIN >=4 RESISTANT Resistant     TETRACYCLINE <=1 SENSITIVE Sensitive     VANCOMYCIN 1 SENSITIVE Sensitive     TRIMETH/SULFA <=10 SENSITIVE Sensitive     CLINDAMYCIN <=0.25 SENSITIVE Sensitive     RIFAMPIN <=0.5 SENSITIVE Sensitive     Inducible Clindamycin NEGATIVE Sensitive     * METHICILLIN RESISTANT STAPHYLOCOCCUS AUREUS  Culture, blood (Routine X 2) w Reflex to ID Panel     Status: Abnormal   Collection Time: 07/31/17  9:37 AM  Result Value Ref Range Status   Specimen Description RIGHT ANTECUBITAL  Final   Special Requests IN PEDIATRIC BOTTLE Blood Culture adequate volume  Final   Culture  Setup Time   Final    GRAM POSITIVE COCCI IN CLUSTERS IN PEDIATRIC BOTTLE CRITICAL VALUE NOTED.  VALUE IS CONSISTENT WITH PREVIOUSLY REPORTED AND CALLED VALUE.    Culture (A)  Final    STAPHYLOCOCCUS AUREUS SUSCEPTIBILITIES PERFORMED ON PREVIOUS CULTURE WITHIN THE LAST 5 DAYS. Performed at [facility], [address]  [place], [place] [address]    Report Status 08/04/2017 FINAL  Final  Culture, blood (Routine X 2) w Reflex to ID Panel     Status: None (Preliminary result)   Collection Time: 08/03/17 12:32 PM  Result Value Ref Range Status   Specimen Description BLOOD LEFT HAND  Final   Special Requests   Final    BOTTLES DRAWN AEROBIC AND ANAEROBIC Blood Culture adequate volume   Culture NO GROWTH < 24 HOURS  Final   Report Status PENDING  Incomplete  Culture, blood (Routine X 2) w Reflex to ID Panel     Status: None (Preliminary result)   Collection Time: 08/03/17 12:40 PM  Result Value Ref Range Status   Specimen Description BLOOD LEFT HAND  Final   Special Requests   Final    BOTTLES DRAWN AEROBIC AND ANAEROBIC Blood Culture results may not be optimal due to an inadequate volume of blood received in culture bottles   Culture NO GROWTH < 24 HOURS  Final   Report Status PENDING  Incomplete      Studies: No results found.  Scheduled Meds: . chlorhexidine  15 mL Mouth Rinse BID  . enoxaparin (LOVENOX) injection  50 mg Subcutaneous QAC breakfast  . folic acid  1 mg Intravenous Daily  . furosemide  40 mg Intravenous Q8H  . insulin aspart  0-20 Units Subcutaneous TID AC & HS  . mouth rinse  15 mL Mouth Rinse q12n4p  . metoprolol tartrate  25 mg Oral BID  . potassium chloride  20 mEq Oral BID  . rifampin  300 mg Oral Q12H  .  sodium chloride flush  3 mL Intravenous Q12H  . thiamine  100 mg Intravenous Daily   Or  . thiamine  100 mg Oral Daily    Continuous Infusions: . sodium chloride    . ceFTAROline (TEFLARO) IV Stopped (08/04/17 2248)  . DAPTOmycin (CUBICIN)  IV Stopped (08/04/17 1523)     LOS: 11 days     Darlin Drop, MD [facility] Pager [phone]  If 7PM-7AM, please contact night-coverage [email or web address] Password [email or web address] 08/05/2017, 7:50 AM

## 2017-08-05 NOTE — Progress Notes (Signed)
Progress Note  Patient Name: Reginald [name] Date of Encounter: 08/05/2017  Primary Cardiologist: Zoila ShutterKenneth Hilty, MD  Subjective   Reports limited appetite. Neck pain - states he has been told that he has arthritis. Still weak overall. No chest pain.  Inpatient Medications    Scheduled Meds: . chlorhexidine  15 mL Mouth Rinse BID  . folic acid  1 mg Intravenous Daily  . furosemide  40 mg Intravenous Q8H  . insulin aspart  0-20 Units Subcutaneous TID AC & HS  . mouth rinse  15 mL Mouth Rinse q12n4p  . metoprolol tartrate  25 mg Oral BID  . potassium chloride  20 mEq Oral BID  . rifampin  300 mg Oral Q12H  . sodium chloride flush  3 mL Intravenous Q12H  . thiamine  100 mg Intravenous Daily   Or  . thiamine  100 mg Oral Daily   Continuous Infusions: . sodium chloride    . ceFTAROline (TEFLARO) IV Stopped (08/05/17 1148)  . DAPTOmycin (CUBICIN)  IV Stopped (08/04/17 1523)   PRN Meds: sodium chloride, acetaminophen, hydrALAZINE, ipratropium-albuterol, [DISCONTINUED] ondansetron **OR** ondansetron (ZOFRAN) IV, sodium chloride flush   Vital Signs    Vitals:   08/04/17 2350 08/05/17 0232 08/05/17 0500 08/05/17 0749  BP: 119/64 124/64  102/63  Pulse:    82  Resp:    18  Temp: 98.9 F (37.2 C) 98.7 F (37.1 C)  98.6 F (37 C)  TempSrc: Oral Oral  Oral  SpO2:    94%  Weight:   242 lb 8.1 oz (110 kg)   Height:        Intake/Output Summary (Last 24 hours) at 08/05/2017 1157 Last data filed at 08/05/2017 0709 Gross per 24 hour  Intake 830 ml  Output 2250 ml  Net -1420 ml   Filed Weights   08/03/17 0415 08/04/17 0345 08/05/17 0500  Weight: 237 lb 3.4 oz (107.6 kg) 237 lb 7 oz (107.7 kg) 242 lb 8.1 oz (110 kg)    Telemetry    Sinus rhythm. Personally reviewed.  Physical Exam   GEN:  Chronically ill-appearing obese male. No acute distress.   Neck: No JVD. Cardiac: RRR, soft apical murmur, no gallop.  Respiratory: Nonlabored. No wheezing. GI:  Obese,  nontender, bowel sounds present. MS:  Mild ankle edema; No deformity. Neuro:  Nonfocal. Psych: Alert and oriented x 3. Normal affect.  Labs    Chemistry Recent Labs  Lab 07/30/17 0228 08/02/17 0455 08/03/17 0304 08/04/17 0231  NA 143 140 139 135  K 4.1 3.6 3.6 3.4*  CL 106 106 106 103  CO2 31 27 28 25   GLUCOSE 190* 197* 133* 120*  BUN 42* 34* 31* 27*  CREATININE 0.79 0.83 0.84 0.85  CALCIUM 9.0 9.0 8.6* 8.7*  PROT 7.9  --  7.8  --   ALBUMIN 2.1*  --  1.9*  --   AST 33  --  34  --   ALT 38  --  28  --   ALKPHOS 207*  --  147*  --   BILITOT 1.4*  --  2.3*  --   GFRNONAA >60 >60 >60 >60  GFRAA >60 >60 >60 >60  ANIONGAP 6 7 5 7      Hematology Recent Labs  Lab 08/03/17 0304 08/04/17 0231 08/05/17 0850  WBC 8.1 7.2 7.1  RBC 2.94* 2.86* 3.00*  HGB 9.1* 8.7* 9.5*  HCT 28.9* 27.3* 28.9*  MCV 98.3 95.5 96.3  MCH 31.0 30.4 31.7  MCHC 31.5 31.9 32.9  RDW 13.9 13.7 14.0  PLT 286 277 313    Radiology    No results found.  Cardiac Studies   Cardiac catheterization 08/02/2017:  Mid RCA lesion is 40% stenosed.  Ost 2nd Mrg to 2nd Mrg lesion is 20% stenosed.  Hemodynamic findings consistent with moderate pulmonary hypertension.   1. Mild non-obstructive CAD  TEE 07/27/2017: Study Conclusions  - Left ventricle: Normal LV size. Moderate LVH. Systolic function   was normal. The estimated ejection fraction was in the range of   60% to 65%. Wall motion was normal; there were no regional wall   motion abnormalities. - Aortic valve: Trileaflet. Small echogenic mass at the tip of the   non-coronary cusp. lambl&'s excrescences. Trivial AI. - Aorta: Grade 3 mobile atheroma of the proximal aortic arch. - Mitral valve: Large 2-3 cm mass of the A3 segment of the anterior   leaflet, prolapsing across the posterior leaflet. There is a   distinct posterior jet which is centrally directed of severe   regurgitation, suggestive of perforation. There is dropout seen    through the vegetation or in the area of the P3 scallop - not   entirely clear, but concerning for possible perforation. There is   vegetation to a lesser extent on the posterior leaflet tip.   Effective regurgitant orifice (PISA): 1.01 cm^2. Regurgitant   volume (PISA): 155 ml. - Left atrium: The atrium was dilated. No evidence of thrombus in   the atrial cavity or appendage. - Right atrium: No evidence of thrombus in the atrial cavity or   appendage. - Atrial septum: No defect or patent foramen ovale was identified. - Pulmonic valve: No evidence of vegetation.  Impressions:  - Large mass of the A3 segment of the anterior mitral leaflet,   prolapsing over the posterior leaflet. Posteriorly located   central regurgitant jet suggestive of possible perforation - in   the region of the P3 segment. Endocarditis of the posterior   leaflet tip is noted. There is a possible small mass on the   non-coronary cusp of the aortic valve with trivial AI - this   could be resolved endocarditis or sclerosis as well.  Patient Profile     56 y.o. 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  Assessment & Plan    1. Acute diastolic heart failure complicated by endocarditis and severe mitral regurgitation. Patient is diuresing on IV Lasix. Renal function stable.  2. MRSA endocarditis involving anterior mitral leaflet with severe mitral regurgitation. Patient is at high risk for embolization as already noted. He underwent cardiac catheterization on December 12 showing nonobstructive coronary atherosclerosis. Patient continues on antibiotics per the ID team. Blood cultures not sterile as yet, most recent set from December 13 still pending. He is afebrile. Plan for dental extractions on Monday and ultimately valve surgery.  3. Essential hypertension. Blood  pressure control is adequate.  I reviewed chart and recent hospital course, plan per Dr. Delton SeeNelson. Blood cultures from December 13 are still pending. He continues on rifampin, Daptomycin, Ceftaroline. Otherwise continues on IV Lasix with good diuresis, and Lopressor with sinus rhythm and stable blood pressure. Continue to replete potassium. Follow-up CBC and BMET in a.m.  Signed, Nona DellSamuel Jayion Schneck, MD  08/05/2017, 11:57 AM

## 2017-08-05 NOTE — Plan of Care (Signed)
  Pain Managment: General experience of comfort will improve 08/05/2017 2247 - Progressing by Reinaldo BerberAguirre, Swara Donze C, RN

## 2017-08-06 ENCOUNTER — Inpatient Hospital Stay (HOSPITAL_COMMUNITY): Payer: Medicaid Other

## 2017-08-06 DIAGNOSIS — M542 Cervicalgia: Secondary | ICD-10-CM

## 2017-08-06 LAB — CBC
HCT: 28.7 % — ABNORMAL LOW (ref 39.0–52.0)
HEMATOCRIT: 28.2 % — AB (ref 39.0–52.0)
Hemoglobin: 9 g/dL — ABNORMAL LOW (ref 13.0–17.0)
Hemoglobin: 9.2 g/dL — ABNORMAL LOW (ref 13.0–17.0)
MCH: 30.3 pg (ref 26.0–34.0)
MCH: 30.6 pg (ref 26.0–34.0)
MCHC: 31.9 g/dL (ref 30.0–36.0)
MCHC: 32.1 g/dL (ref 30.0–36.0)
MCV: 94.9 fL (ref 78.0–100.0)
MCV: 95.3 fL (ref 78.0–100.0)
PLATELETS: 309 10*3/uL (ref 150–400)
Platelets: 305 10*3/uL (ref 150–400)
RBC: 2.97 MIL/uL — ABNORMAL LOW (ref 4.22–5.81)
RBC: 3.01 MIL/uL — ABNORMAL LOW (ref 4.22–5.81)
RDW: 13.8 % (ref 11.5–15.5)
RDW: 13.7 % (ref 11.5–15.5)
WBC: 7.3 10*3/uL (ref 4.0–10.5)
WBC: 7.4 10*3/uL (ref 4.0–10.5)

## 2017-08-06 LAB — BASIC METABOLIC PANEL
Anion gap: 8 (ref 5–15)
BUN: 25 mg/dL — AB (ref 6–20)
CO2: 25 mmol/L (ref 22–32)
CREATININE: 0.91 mg/dL (ref 0.61–1.24)
Calcium: 8.5 mg/dL — ABNORMAL LOW (ref 8.9–10.3)
Chloride: 98 mmol/L — ABNORMAL LOW (ref 101–111)
GFR calc Af Amer: >60 mL/min (ref >60)
GLUCOSE: 111 mg/dL — AB (ref 65–99)
POTASSIUM: 3.9 mmol/L (ref 3.5–5.1)
Sodium: 131 mmol/L — ABNORMAL LOW (ref 135–145)

## 2017-08-06 LAB — GLUCOSE, CAPILLARY
GLUCOSE-CAPILLARY: 142 mg/dL — AB (ref 65–99)
Glucose-Capillary: 138 mg/dL — ABNORMAL HIGH (ref 65–99)
Glucose-Capillary: 132 mg/dL — ABNORMAL HIGH (ref 65–99)
Glucose-Capillary: 190 mg/dL — ABNORMAL HIGH (ref 65–99)

## 2017-08-06 MED ORDER — LIDOCAINE 5 % EX PTCH
1.0000 | MEDICATED_PATCH | CUTANEOUS | Status: DC
  Administered 2017-08-06 – 2017-08-10 (×5): 1 via TRANSDERMAL
  Filled 2017-08-06 (×5): qty 1

## 2017-08-06 NOTE — Progress Notes (Addendum)
Progress Note  Patient Name: Reginald [name] Date of Encounter: 08/06/2017  Primary Cardiologist: Zoila ShutterKenneth Hilty, MD  Subjective   He needs to have a limited appetite.  Continues with neck pain and right-sided chest pain that started yesterday.  Heart catheterization showed nonobstructive coronary disease.  Inpatient Medications    Scheduled Meds: . chlorhexidine  15 mL Mouth Rinse BID  . folic acid  1 mg Intravenous Daily  . furosemide  40 mg Intravenous Q8H  . insulin aspart  0-20 Units Subcutaneous TID AC & HS  . lidocaine  1 patch Transdermal Q24H  . mouth rinse  15 mL Mouth Rinse q12n4p  . metoprolol tartrate  25 mg Oral BID  . potassium chloride  20 mEq Oral BID  . rifampin  300 mg Oral Q12H  . sodium chloride flush  3 mL Intravenous Q12H  . thiamine  100 mg Intravenous Daily   Or  . thiamine  100 mg Oral Daily   Continuous Infusions: . sodium chloride    . ceFTAROline (TEFLARO) IV Stopped (08/06/17 0920)  . DAPTOmycin (CUBICIN)  IV Stopped (08/05/17 1411)   PRN Meds: sodium chloride, acetaminophen, hydrALAZINE, ipratropium-albuterol, [DISCONTINUED] ondansetron **OR** ondansetron (ZOFRAN) IV, sodium chloride flush   Vital Signs    Vitals:   08/05/17 1720 08/05/17 2010 08/05/17 2331 08/06/17 0406  BP: 118/73 (!) 103/52 113/73 110/77  Pulse: 81 80    Resp: 18 (!) 23    Temp: (!) 97.5 F (36.4 C) 98.5 F (36.9 C) 99.5 F (37.5 C) 97.7 F (36.5 C)  TempSrc: Oral Oral Oral Oral  SpO2: 96% 98%    Weight:    232 lb 9.4 oz (105.5 kg)  Height:        Intake/Output Summary (Last 24 hours) at 08/06/2017 1056 Last data filed at 08/06/2017 [id number] Gross per 24 hour  Intake 2180 ml  Output 2050 ml  Net 130 ml   Filed Weights   08/04/17 0345 08/05/17 0500 08/06/17 0406  Weight: 237 lb 7 oz (107.7 kg) 242 lb 8.1 oz (110 kg) 232 lb 9.4 oz (105.5 kg)    Telemetry    Sinus rhythm. Personally reviewed.  Physical Exam   GEN: Well nourished, well developed,  in no acute distress  HEENT: normal  Neck: no JVD, carotid bruits, or masses Cardiac: RRR; 2 out of 6 systolic murmur at the apex, no rubs, or gallops,no edema  Respiratory:  clear to auscultation bilaterally, normal work of breathing GI: soft, nontender, nondistended, + BS MS: no deformity or atrophy  Skin: warm and dry Neuro:  Strength and sensation are intact Psych: euthymic mood, full affect   Labs    Chemistry Recent Labs  Lab 08/03/17 0304 08/04/17 0231 08/05/17 2351  NA 139 135 131*  K 3.6 3.4* 3.9  CL 106 103 98*  CO2 28 25 25   GLUCOSE 133* 120* 111*  BUN 31* 27* 25*  CREATININE 0.84 0.85 0.91  CALCIUM 8.6* 8.7* 8.5*  PROT 7.8  --   --   ALBUMIN 1.9*  --   --   AST 34  --   --   ALT 28  --   --   ALKPHOS 147*  --   --   BILITOT 2.3*  --   --   GFRNONAA >60 >60 >60  GFRAA >60 >60 >60  ANIONGAP 5 7 8      Hematology Recent Labs  Lab 08/05/17 0850 08/05/17 2351 08/06/17 0240  WBC 7.1 7.3 7.4  RBC 3.00* 3.01* 2.97*  HGB 9.5* 9.2* 9.0*  HCT 28.9* 28.7* 28.2*  MCV 96.3 95.3 94.9  MCH 31.7 30.6 30.3  MCHC 32.9 32.1 31.9  RDW 14.0 13.8 13.7  PLT 313 309 305    Radiology    No results found.  Cardiac Studies   Cardiac catheterization 08/02/2017:  Mid RCA lesion is 40% stenosed.  Ost 2nd Mrg to 2nd Mrg lesion is 20% stenosed.  Hemodynamic findings consistent with moderate pulmonary hypertension.   1. Mild non-obstructive CAD  TEE 07/27/2017: Study Conclusions  - Left ventricle: Normal LV size. Moderate LVH. Systolic function   was normal. The estimated ejection fraction was in the range of   60% to 65%. Wall motion was normal; there were no regional wall   motion abnormalities. - Aortic valve: Trileaflet. Small echogenic mass at the tip of the   non-coronary cusp. lambl&'s excrescences. Trivial AI. - Aorta: Grade 3 mobile atheroma of the proximal aortic arch. - Mitral valve: Large 2-3 cm mass of the A3 segment of the anterior   leaflet,  prolapsing across the posterior leaflet. There is a   distinct posterior jet which is centrally directed of severe   regurgitation, suggestive of perforation. There is dropout seen   through the vegetation or in the area of the P3 scallop - not   entirely clear, but concerning for possible perforation. There is   vegetation to a lesser extent on the posterior leaflet tip.   Effective regurgitant orifice (PISA): 1.01 cm^2. Regurgitant   volume (PISA): 155 ml. - Left atrium: The atrium was dilated. No evidence of thrombus in   the atrial cavity or appendage. - Right atrium: No evidence of thrombus in the atrial cavity or   appendage. - Atrial septum: No defect or patent foramen ovale was identified. - Pulmonic valve: No evidence of vegetation.  Impressions:  - Large mass of the A3 segment of the anterior mitral leaflet,   prolapsing over the posterior leaflet. Posteriorly located   central regurgitant jet suggestive of possible perforation - in   the region of the P3 segment. Endocarditis of the posterior   leaflet tip is noted. There is a possible small mass on the   non-coronary cusp of the aortic valve with trivial AI - this   could be resolved endocarditis or sclerosis as well.  Patient Profile     56 y.o. 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  Assessment & Plan    1. Acute diastolic heart failure complicated by endocarditis and severe mitral regurgitation: Currently diuresing on IV Lasix.  Has put out 14.5 L.  Continue with diuresis.  Patient is currently having right-sided chest pain and neck pain.  Heart catheterization showed nonobstructive disease.  Unlikely to be cardiac in nature.  2. 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  Antibiotics per infectious disease.  Blood cultures from 12/13 showed no growth to date.  3. Essential hypertension: Blood pressure well controlled  Signed, Caryn Gienger Jorja LoaMartin Sani Loiseau, MD  08/06/2017, 10:56 AM

## 2017-08-06 NOTE — Progress Notes (Signed)
PROGRESS NOTE  Reginald [name] FAO:[id number] DOB: 07/22/1961 DOA: 07/25/2017 PCP: Patient, No Pcp Per  HPI/Recap of past 6524 hours:  56 year old male with past medical history significant for COPD, diabetes mellitus, alcohol abuse, diastolic CHF, and AAA who was initially admitted to [facility] on 07/20/17 with nonspecific complaints of generalized weakness, low-grade fever, and muscle aches.  Blood cultures done at admission grew out MRSA and he was started on vancomycin.  2D echocardiogram at that time demonstrated a mitral valve vegetation.  ICU transfer at [facility] for Precedex infusion with presumed alcohol withdrawal was weaned off on 12/3.  [facility] for infectious diseases consultation.  On 12/4 found to have a new facial droop.  MRI brain revealed multiple infarcts to both cerebral hemispheres consistent with septic emboli. Transfer[facility] to continue workup and treatment. 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   Dental surgeon planning extraction of remaining teeth with alveoloplasty and pre-prosthetic surgery in the operating room with general anesthesia for Monday, 08/07/2017 at 7:30 AM if repeated blood cultures negative.  Blood culture x 2 (08/03/17) no growth in 2 days (08/06/17). On IV ceftaroline day# 6, IV daptomycin day # 4, IV rifampin day # 4.  No acute events reported overnight. This morning patient reports neck pain dull and non radiating. Xray neck ordered and started on lidocaine patch. Pain improved after lidocaine patch from 8-9/10 to 2/10. Has no other complaints at this time. Blood cx x2 done on 08/03/17 no growth in 3 days. Plan for dental extraction tomorrow 08/07/17.   Assessment/Plan: Principal Problem:   MRSA bacteremia Active Problems:   Mitral valve vegetation   Acute respiratory failure (HCC)   Alcohol abuse   Thrombocytopenia  (HCC)   Abnormal liver enzymes   Acute encephalopathy   Acute diastolic CHF (congestive heart failure) (HCC)   Sepsis (HCC)   Endocarditis of mitral valve   Cerebral septic emboli (HCC)   Hypertensive heart disease with heart failure (HCC)   Endotracheal tube present   Acute respiratory disease   Poor dentition  AMS, resolved, possibly 2/2 to septic emboli -MRI brain 07/26/17: Multiple small areas of restricted diffusion in the brain bilaterally most compatible with acute infarction from emboli. Consider septic emboli given the history of bacteremia. -ID following  MRSA bacteremia  -possible source of infection is oral from severe dental caries-dental surgery following -blood cultures positive 07/25/17, 07/28/17, 07/31/17 -blood cx  12/13/ 18 negative for 3 days -IV ceftaroline, IV daptomycin, IV rifampin day #11 of IV antibiotics -ID following. We appreciate recommendations -afebrile no leukocytosis -cbc am  MV endocarditis -ID, cardiology following -may require M valve replacement. Defer to cardiology -c/w IV antibiotics -right/left heart cath 08/02/17 -TEE 07/27/17: Impressions: Large mass of the A3 segment of the anterior mitral leaflet, prolapsing over the posterior leaflet. Posteriorly located central regurgitant jet suggestive of possible perforation - in the region of the P3 segment. Endocarditis of the posterior leaflet tip is noted. There is a possible small mass on the non-coronary cusp of the aortic valve with trivial AI - this could be resolved endocarditis or sclerosis as well.  CNS, septic emboli -management as stated above  Possible multifocal pneumonia on CT, poa -continue IV antibiotics  Poor dentition -dental surgeon following -planning extraction of remaining teeth with alveoloplasty and pre-prosthetic surgery in the operating room with general anesthesia for Monday,  08/07/2017 at 7:30 AM.   -we appreciate recommendations  Chronic  normocytic anemia -stable -hg 9.5 from 8.7 from 9.1 from 11.2 baseline hg 11 -mcv 96 (08/05/17) -no sign of overt bleeding -cbc am  Severe neck pain, chronic -neck xray in process 08/06/17 -lidocaine patch to apply to neck -no radiculopathy   Code Status: Full  Family Communication: No family members at bedside  Disposition Plan: will stay another midnight to continue IV antibiotics. Possible dental extraction of remaining teeth with alveoloplasty and pre-prosthetic surgery in the operating room with general anesthesia for Monday, 08/07/2017 at 7:30 AM.  Blood cx x2 negative.   Consultants:  ID  Cardiology  Dental surgery  Procedures:  right/left [facility] 08/02/17  Antimicrobials:  IV daptomycin  IV rifampin  IV ceftaroline  DVT prophylaxis:  sq lovenox 50 mg daily   Objective: Vitals:   08/05/17 1720 08/05/17 2010 08/05/17 2331 08/06/17 0406  BP: 118/73 (!) 103/52 113/73 110/77  Pulse: 81 80    Resp: 18 (!) 23    Temp: (!) 97.5 F (36.4 C) 98.5 F (36.9 C) 99.5 F (37.5 C) 97.7 F (36.5 C)  TempSrc: Oral Oral Oral Oral  SpO2: 96% 98%    Weight:    105.5 kg (232 lb 9.4 oz)  Height:        Intake/Output Summary (Last 24 hours) at 08/06/2017 0733 Last data filed at 08/06/2017 1610 Gross per 24 hour  Intake 2300 ml  Output 2050 ml  Net 250 ml   Filed Weights   08/04/17 0345 08/05/17 0500 08/06/17 0406  Weight: 107.7 kg (237 lb 7 oz) 110 kg (242 lb 8.1 oz) 105.5 kg (232 lb 9.4 oz)    Exam:   General:  56 yo CM WD WN alert and oriented x 3  Cardiovascular: RRR no rubs or gallops  Respiratory: CTA no wheezes or rhonchi  Abdomen: soft NT ND NBS x4   Musculoskeletal: Moves all 4 extremities. Non focal  Skin: no noted open ulcerative lesions  Psychiatry: mood is appropriate for condition and setting   Data Reviewed: CBC: Recent Labs  Lab 07/31/17 0324 08/01/17 0344 08/02/17 0710 08/03/17 0304 08/04/17 0231 08/05/17 0850  08/05/17 2351 08/06/17 0240  WBC 10.9* 10.2 11.2* 8.1 7.2 7.1 7.3 7.4  NEUTROABS 9.1* 8.7* 9.9*  --   --   --   --   --   HGB 10.1* 11.2* 11.1* 9.1* 8.7* 9.5* 9.2* 9.0*  HCT 32.3* 35.5* 35.3* 28.9* 27.3* 28.9* 28.7* 28.2*  MCV 100.0 99.4 98.9 98.3 95.5 96.3 95.3 94.9  PLT 252 PLATELET CLUMPS NOTED ON SMEAR, COUNT APPEARS ADEQUATE 351 286 277 313 309 305   Basic Metabolic Panel: Recent Labs  Lab 07/31/17 0324 08/01/17 0344 08/02/17 0455 08/03/17 0304 08/04/17 0231 08/05/17 2351  NA  --   --  140 139 135 131*  K  --   --  3.6 3.6 3.4* 3.9  CL  --   --  106 106 103 98*  CO2  --   --  27 28 25 25   GLUCOSE  --   --  197* 133* 120* 111*  BUN  --   --  34* 31* 27* 25*  CREATININE  --   --  0.83 0.84 0.85 0.91  CALCIUM  --   --  9.0 8.6* 8.7* 8.5*  MG 2.2 2.1  --   --   --   --   PHOS 4.5 3.5  --   --   --   --  GFR: Estimated Creatinine Clearance: 107.2 mL/min (by C-G formula based on SCr of 0.91 mg/dL). Liver Function Tests: Recent Labs  Lab 08/03/17 0304  AST 34  ALT 28  ALKPHOS 147*  BILITOT 2.3*  PROT 7.8  ALBUMIN 1.9*   No results for input(s): LIPASE, AMYLASE in the last 168 hours. Recent Labs  Lab 08/02/17 1049  AMMONIA 101*   Coagulation Profile: Recent Labs  Lab 08/02/17 0242  INR 1.25   Cardiac Enzymes: Recent Labs  Lab 07/31/17 0324  CKTOTAL 24*  CKMB 0.6   BNP (last 3 results) No results for input(s): PROBNP in the last 8760 hours. HbA1C: No results for input(s): HGBA1C in the last 72 hours. CBG: Recent Labs  Lab 08/04/17 1701 08/05/17 1155 08/05/17 1637 08/05/17 2103 08/06/17 0555  GLUCAP 135* 166* 136* 151* 138*   Lipid Profile: Recent Labs    08/05/17 0850  TRIG 366*   Thyroid Function Tests: No results for input(s): TSH, T4TOTAL, FREET4, T3FREE, THYROIDAB in the last 72 hours. Anemia Panel: No results for input(s): VITAMINB12, FOLATE, FERRITIN, TIBC, IRON, RETICCTPCT in the last 72 hours. Urine analysis:    Component  Value Date/Time   COLORURINE STRAW (A) 07/28/2017 1330   APPEARANCEUR CLEAR 07/28/2017 1330   LABSPEC 1.010 07/28/2017 1330   PHURINE 5.0 07/28/2017 1330   GLUCOSEU NEGATIVE 07/28/2017 1330   HGBUR NEGATIVE 07/28/2017 1330   BILIRUBINUR NEGATIVE 07/28/2017 1330   KETONESUR NEGATIVE 07/28/2017 1330   PROTEINUR NEGATIVE 07/28/2017 1330   NITRITE NEGATIVE 07/28/2017 1330   LEUKOCYTESUR NEGATIVE 07/28/2017 1330   Sepsis Labs: @LABRCNTIP (procalcitonin:4,lacticidven:4)  ) Recent Results (from the past 240 hour(s))  MRSA PCR Screening     Status: Abnormal   Collection Time: 07/27/17  6:20 PM  Result Value Ref Range Status   MRSA by PCR POSITIVE (A) NEGATIVE Final    Comment:        The GeneXpert MRSA Assay (FDA approved for NASAL specimens only), is one component of a comprehensive MRSA colonization surveillance program. It is not intended to diagnose MRSA infection nor to guide or monitor treatment for MRSA infections. RESULT CALLED TO, READ BACK BY AND VERIFIED WITH: WOODY,J RN 12.6.18 @2014  ZANDO,C   Culture, blood (routine x 2)     Status: Abnormal   Collection Time: 07/28/17  7:44 AM  Result Value Ref Range Status   Specimen Description BLOOD LEFT HAND  Final   Special Requests IN PEDIATRIC BOTTLE Blood Culture adequate volume  Final   Culture  Setup Time   Final    GRAM POSITIVE COCCI IN CLUSTERS IN PEDIATRIC BOTTLE CRITICAL VALUE NOTED.  VALUE IS CONSISTENT WITH PREVIOUSLY REPORTED AND CALLED VALUE.    Culture (A)  Final    STAPHYLOCOCCUS AUREUS SUSCEPTIBILITIES PERFORMED ON PREVIOUS CULTURE WITHIN THE LAST 5 DAYS. Performed at [facility], [address] [place], [place] [address]    Report Status 07/30/2017 FINAL  Final  Culture, blood (routine x 2)     Status: Abnormal   Collection Time: 07/28/17  7:45 AM  Result Value Ref Range Status   Specimen Description BLOOD RIGHT HAND  Final   Special Requests IN PEDIATRIC BOTTLE Blood Culture adequate volume   Final   Culture  Setup Time   Final    GRAM POSITIVE COCCI IN CLUSTERS IN PEDIATRIC BOTTLE CRITICAL VALUE NOTED.  VALUE IS CONSISTENT WITH PREVIOUSLY REPORTED AND CALLED VALUE.    Culture (A)  Final    STAPHYLOCOCCUS AUREUS SUSCEPTIBILITIES PERFORMED ON  PREVIOUS CULTURE WITHIN THE LAST 5 DAYS. Performed at [facility], [address], [place], [place] [address]    Report Status 07/30/2017 FINAL  Final  Culture, blood (Routine X 2) w Reflex to ID Panel     Status: Abnormal   Collection Time: 07/31/17  9:37 AM  Result Value Ref Range Status   Specimen Description BLOOD RIGHT FOREARM  Final   Special Requests   Final    BOTTLES DRAWN AEROBIC AND ANAEROBIC Blood Culture adequate volume   Culture  Setup Time   Final    GRAM POSITIVE COCCI IN CLUSTERS ANAEROBIC BOTTLE ONLY CRITICAL VALUE NOTED.  VALUE IS CONSISTENT WITH PREVIOUSLY REPORTED AND CALLED VALUE. Performed at [facility], [address], [place], [place] [address]    Culture METHICILLIN RESISTANT STAPHYLOCOCCUS AUREUS (A)  Final   Report Status 08/04/2017 FINAL  Final   Organism ID, Bacteria METHICILLIN RESISTANT STAPHYLOCOCCUS AUREUS  Final      Susceptibility   Methicillin resistant staphylococcus aureus - MIC*    CIPROFLOXACIN >=8 RESISTANT Resistant     ERYTHROMYCIN >=8 RESISTANT Resistant     GENTAMICIN <=0.5 SENSITIVE Sensitive     OXACILLIN >=4 RESISTANT Resistant     TETRACYCLINE <=1 SENSITIVE Sensitive     VANCOMYCIN 1 SENSITIVE Sensitive     TRIMETH/SULFA <=10 SENSITIVE Sensitive     CLINDAMYCIN <=0.25 SENSITIVE Sensitive     RIFAMPIN <=0.5 SENSITIVE Sensitive     Inducible Clindamycin NEGATIVE Sensitive     * METHICILLIN RESISTANT STAPHYLOCOCCUS AUREUS  Culture, blood (Routine X 2) w Reflex to ID Panel     Status: Abnormal   Collection Time: 07/31/17  9:37 AM  Result Value Ref Range Status   Specimen Description RIGHT ANTECUBITAL  Final   Special Requests IN PEDIATRIC BOTTLE Blood Culture  adequate volume  Final   Culture  Setup Time   Final    GRAM POSITIVE COCCI IN CLUSTERS IN PEDIATRIC BOTTLE CRITICAL VALUE NOTED.  VALUE IS CONSISTENT WITH PREVIOUSLY REPORTED AND CALLED VALUE.    Culture (A)  Final    STAPHYLOCOCCUS AUREUS SUSCEPTIBILITIES PERFORMED ON PREVIOUS CULTURE WITHIN THE LAST 5 DAYS. Performed at [facility], [address], [place], [place] [address]    Report Status 08/04/2017 FINAL  Final  Culture, blood (Routine X 2) w Reflex to ID Panel     Status: None (Preliminary result)   Collection Time: 08/03/17 12:32 PM  Result Value Ref Range Status   Specimen Description BLOOD LEFT HAND  Final   Special Requests   Final    BOTTLES DRAWN AEROBIC AND ANAEROBIC Blood Culture adequate volume   Culture NO GROWTH 2 DAYS  Final   Report Status PENDING  Incomplete  Culture, blood (Routine X 2) w Reflex to ID Panel     Status: None (Preliminary result)   Collection Time: 08/03/17 12:40 PM  Result Value Ref Range Status   Specimen Description BLOOD LEFT HAND  Final   Special Requests   Final    BOTTLES DRAWN AEROBIC AND ANAEROBIC Blood Culture results may not be optimal due to an inadequate volume of blood received in culture bottles   Culture NO GROWTH 2 DAYS  Final   Report Status PENDING  Incomplete      Studies: No results found.  Scheduled Meds: . chlorhexidine  15 mL Mouth Rinse BID  . folic acid  1 mg Intravenous Daily  . furosemide  40 mg Intravenous Q8H  .  insulin aspart  0-20 Units Subcutaneous TID AC & HS  . mouth rinse  15 mL Mouth Rinse q12n4p  . metoprolol tartrate  25 mg Oral BID  . potassium chloride  20 mEq Oral BID  . rifampin  300 mg Oral Q12H  . sodium chloride flush  3 mL Intravenous Q12H  . thiamine  100 mg Intravenous Daily   Or  . thiamine  100 mg Oral Daily    Continuous Infusions: . sodium chloride    . ceFTAROline (TEFLARO) IV Stopped (08/05/17 2312)  . DAPTOmycin (CUBICIN)  IV Stopped (08/05/17 1411)     LOS: 12  days     Darlin Droparole N , MD [facility] Pager [phone]  If 7PM-7AM, please contact night-coverage [email or web address] Password [email or web address] 08/06/2017, 7:33 AM

## 2017-08-07 ENCOUNTER — Encounter (HOSPITAL_COMMUNITY)
Admission: AD | Disposition: A | Discharge status: Rehabilitation Facility | Payer: Self-pay | Source: Other Acute Inpatient Hospital | Attending: Internal Medicine

## 2017-08-07 ENCOUNTER — Inpatient Hospital Stay (HOSPITAL_COMMUNITY): Payer: Medicaid Other | Admitting: Certified Registered"

## 2017-08-07 ENCOUNTER — Encounter (HOSPITAL_COMMUNITY): Payer: Self-pay | Admitting: Certified Registered"

## 2017-08-07 DIAGNOSIS — K029 Dental caries, unspecified: Secondary | ICD-10-CM | POA: Diagnosis present

## 2017-08-07 DIAGNOSIS — K083 Retained dental root: Secondary | ICD-10-CM

## 2017-08-07 DIAGNOSIS — K045 Chronic apical periodontitis: Secondary | ICD-10-CM

## 2017-08-07 DIAGNOSIS — K0530 Chronic periodontitis, unspecified: Secondary | ICD-10-CM | POA: Diagnosis present

## 2017-08-07 HISTORY — PX: MULTIPLE EXTRACTIONS WITH ALVEOLOPLASTY: SHX5342

## 2017-08-07 LAB — CBC
HEMATOCRIT: 26.3 % — AB (ref 39.0–52.0)
HEMOGLOBIN: 8.5 g/dL — AB (ref 13.0–17.0)
MCH: 30.1 pg (ref 26.0–34.0)
MCHC: 32.3 g/dL (ref 30.0–36.0)
MCV: 93.3 fL (ref 78.0–100.0)
Platelets: 279 10*3/uL (ref 150–400)
RBC: 2.82 MIL/uL — ABNORMAL LOW (ref 4.22–5.81)
RDW: 13.8 % (ref 11.5–15.5)
WBC: 6.8 10*3/uL (ref 4.0–10.5)

## 2017-08-07 LAB — BASIC METABOLIC PANEL
Anion gap: 7 (ref 5–15)
BUN: 20 mg/dL (ref 6–20)
CHLORIDE: 99 mmol/L — AB (ref 101–111)
CO2: 26 mmol/L (ref 22–32)
CREATININE: 0.91 mg/dL (ref 0.61–1.24)
Calcium: 8.4 mg/dL — ABNORMAL LOW (ref 8.9–10.3)
GFR calc Af Amer: >60 mL/min (ref >60)
GFR calc non Af Amer: >60 mL/min (ref >60)
Glucose, Bld: 175 mg/dL — ABNORMAL HIGH (ref 65–99)
Potassium: 3.7 mmol/L (ref 3.5–5.1)
SODIUM: 132 mmol/L — AB (ref 135–145)

## 2017-08-07 LAB — GLUCOSE, CAPILLARY
GLUCOSE-CAPILLARY: 168 mg/dL — AB (ref 65–99)
GLUCOSE-CAPILLARY: 166 mg/dL — AB (ref 65–99)
GLUCOSE-CAPILLARY: 208 mg/dL — AB (ref 65–99)
Glucose-Capillary: 173 mg/dL — ABNORMAL HIGH (ref 65–99)
Glucose-Capillary: 199 mg/dL — ABNORMAL HIGH (ref 65–99)

## 2017-08-07 LAB — CK: Total CK: 19 U/L — ABNORMAL LOW (ref 49–397)

## 2017-08-07 SURGERY — MULTIPLE EXTRACTION WITH ALVEOLOPLASTY
Anesthesia: General

## 2017-08-07 MED ORDER — DEXAMETHASONE SODIUM PHOSPHATE 10 MG/ML IJ SOLN
INTRAMUSCULAR | Status: AC
  Filled 2017-08-07: qty 1

## 2017-08-07 MED ORDER — LACTATED RINGERS IV SOLN: INTRAVENOUS | Status: DC

## 2017-08-07 MED ORDER — LIDOCAINE-EPINEPHRINE 2 %-1:100000 IJ SOLN
INTRAMUSCULAR | Status: DC | PRN
  Administered 2017-08-07: 3.4 mL via INTRADERMAL

## 2017-08-07 MED ORDER — HYDROMORPHONE HCL 1 MG/ML IJ SOLN
0.5000 mg | INTRAMUSCULAR | Status: DC | PRN
  Administered 2017-08-07 – 2017-08-10 (×12): 0.5 mg via INTRAVENOUS
  Filled 2017-08-07 (×12): qty 0.5

## 2017-08-07 MED ORDER — OXYMETAZOLINE HCL 0.05 % NA SOLN
NASAL | Status: DC | PRN
  Administered 2017-08-07 (×2): 1 via NASAL

## 2017-08-07 MED ORDER — MIDAZOLAM HCL 2 MG/2ML IJ SOLN
INTRAMUSCULAR | Status: AC
  Filled 2017-08-07: qty 2

## 2017-08-07 MED ORDER — DEXAMETHASONE SODIUM PHOSPHATE 10 MG/ML IJ SOLN
INTRAMUSCULAR | Status: DC | PRN
  Administered 2017-08-07: 10 mg via INTRAVENOUS

## 2017-08-07 MED ORDER — LIDOCAINE 2% (20 MG/ML) 5 ML SYRINGE
INTRAMUSCULAR | Status: AC
  Filled 2017-08-07: qty 5

## 2017-08-07 MED ORDER — BUPIVACAINE-EPINEPHRINE 0.5% -1:200000 IJ SOLN
INTRAMUSCULAR | Status: DC | PRN
  Administered 2017-08-07: 9 mL

## 2017-08-07 MED ORDER — GELATIN ABSORBABLE 12-7 MM EX MISC
CUTANEOUS | Status: DC | PRN
  Administered 2017-08-07: 1

## 2017-08-07 MED ORDER — ONDANSETRON HCL 4 MG/2ML IJ SOLN
INTRAMUSCULAR | Status: AC
  Filled 2017-08-07: qty 2

## 2017-08-07 MED ORDER — LIDOCAINE 2% (20 MG/ML) 5 ML SYRINGE
INTRAMUSCULAR | Status: DC | PRN
  Administered 2017-08-07: 40 mg via INTRAVENOUS

## 2017-08-07 MED ORDER — OXYMETAZOLINE HCL 0.05 % NA SOLN
NASAL | Status: DC | PRN
  Administered 2017-08-07: 1

## 2017-08-07 MED ORDER — LIDOCAINE-EPINEPHRINE 2 %-1:100000 IJ SOLN
INTRAMUSCULAR | Status: AC
  Filled 2017-08-07: qty 10.2

## 2017-08-07 MED ORDER — OXYMETAZOLINE HCL 0.05 % NA SOLN
NASAL | Status: AC
  Filled 2017-08-07: qty 15

## 2017-08-07 MED ORDER — FENTANYL CITRATE (PF) 100 MCG/2ML IJ SOLN
INTRAMUSCULAR | Status: DC | PRN
  Administered 2017-08-07: 100 ug via INTRAVENOUS
  Administered 2017-08-07: 50 ug via INTRAVENOUS

## 2017-08-07 MED ORDER — PROPOFOL 10 MG/ML IV BOLUS
INTRAVENOUS | Status: AC
  Filled 2017-08-07: qty 20

## 2017-08-07 MED ORDER — ONDANSETRON HCL 4 MG/2ML IJ SOLN: 4.0000 mg | Freq: Once | INTRAMUSCULAR | Status: DC | PRN

## 2017-08-07 MED ORDER — LACTATED RINGERS IV SOLN
INTRAVENOUS | Status: DC | PRN
  Administered 2017-08-07: 07:00:00 via INTRAVENOUS

## 2017-08-07 MED ORDER — ONDANSETRON HCL 4 MG/2ML IJ SOLN
INTRAMUSCULAR | Status: DC | PRN
  Administered 2017-08-07: 4 mg via INTRAVENOUS

## 2017-08-07 MED ORDER — ROCURONIUM BROMIDE 10 MG/ML (PF) SYRINGE
PREFILLED_SYRINGE | INTRAVENOUS | Status: DC | PRN
  Administered 2017-08-07: 50 mg via INTRAVENOUS

## 2017-08-07 MED ORDER — ROCURONIUM BROMIDE 10 MG/ML (PF) SYRINGE
PREFILLED_SYRINGE | INTRAVENOUS | Status: AC
  Filled 2017-08-07: qty 5

## 2017-08-07 MED ORDER — BUPIVACAINE-EPINEPHRINE (PF) 0.5% -1:200000 IJ SOLN
INTRAMUSCULAR | Status: AC
  Filled 2017-08-07: qty 3.6

## 2017-08-07 MED ORDER — FENTANYL CITRATE (PF) 100 MCG/2ML IJ SOLN: 25.0000 ug | INTRAMUSCULAR | Status: DC | PRN

## 2017-08-07 MED ORDER — 0.9 % SODIUM CHLORIDE (POUR BTL) OPTIME
TOPICAL | Status: DC | PRN
  Administered 2017-08-07: 1000 mL

## 2017-08-07 MED ORDER — FENTANYL CITRATE (PF) 250 MCG/5ML IJ SOLN
INTRAMUSCULAR | Status: AC
  Filled 2017-08-07: qty 5

## 2017-08-07 MED ORDER — MIDAZOLAM HCL 5 MG/5ML IJ SOLN
INTRAMUSCULAR | Status: DC | PRN
  Administered 2017-08-07 (×2): 1 mg via INTRAVENOUS

## 2017-08-07 MED ORDER — PROPOFOL 10 MG/ML IV BOLUS
INTRAVENOUS | Status: DC | PRN
  Administered 2017-08-07: 20 mg via INTRAVENOUS
  Administered 2017-08-07: 150 mg via INTRAVENOUS

## 2017-08-07 SURGICAL SUPPLY — 39 items
ALCOHOL 70% 16 OZ (MISCELLANEOUS) ×3 IMPLANT
ATTRACTOMAT 16X20 MAGNETIC DRP (DRAPES) ×3 IMPLANT
BLADE SURG 15 STRL LF DISP TIS (BLADE) ×2 IMPLANT
BLADE SURG 15 STRL SS (BLADE) ×4
COVER SURGICAL LIGHT HANDLE (MISCELLANEOUS) ×3 IMPLANT
GAUZE PACKING FOLDED 2  STR (GAUZE/BANDAGES/DRESSINGS) ×2
GAUZE PACKING FOLDED 2 STR (GAUZE/BANDAGES/DRESSINGS) ×1 IMPLANT
GAUZE SPONGE 4X4 16PLY XRAY LF (GAUZE/BANDAGES/DRESSINGS) ×3 IMPLANT
GLOVE BIO SURGEON STRL SZ 6.5 (GLOVE) ×4 IMPLANT
GLOVE BIO SURGEONS STRL SZ 6.5 (GLOVE) ×2
GLOVE BIOGEL PI IND STRL 6 (GLOVE) ×1 IMPLANT
GLOVE BIOGEL PI INDICATOR 6 (GLOVE) ×2
GLOVE SURG ORTHO 8.0 STRL STRW (GLOVE) ×3 IMPLANT
GLOVE SURG SS PI 6.0 STRL IVOR (GLOVE) ×3 IMPLANT
GOWN STRL REUS W/ TWL LRG LVL3 (GOWN DISPOSABLE) ×1 IMPLANT
GOWN STRL REUS W/TWL 2XL LVL3 (GOWN DISPOSABLE) ×3 IMPLANT
GOWN STRL REUS W/TWL LRG LVL3 (GOWN DISPOSABLE) ×2
HEMOSTAT SURGICEL 2X14 (HEMOSTASIS) ×3 IMPLANT
KIT BASIN OR (CUSTOM PROCEDURE TRAY) ×3 IMPLANT
KIT ROOM TURNOVER OR (KITS) ×3 IMPLANT
MANIFOLD NEPTUNE WASTE (CANNULA) ×3 IMPLANT
NEEDLE BLUNT 16X1.5 OR ONLY (NEEDLE) ×3 IMPLANT
NS IRRIG 1000ML POUR BTL (IV SOLUTION) ×3 IMPLANT
PACK EENT II TURBAN DRAPE (CUSTOM PROCEDURE TRAY) ×3 IMPLANT
PAD ARMBOARD 7.5X6 YLW CONV (MISCELLANEOUS) ×3 IMPLANT
SPONGE SURGIFOAM ABS GEL 100 (HEMOSTASIS) IMPLANT
SPONGE SURGIFOAM ABS GEL 12-7 (HEMOSTASIS) ×3 IMPLANT
SPONGE SURGIFOAM ABS GEL SZ50 (HEMOSTASIS) IMPLANT
SUCTION FRAZIER HANDLE 10FR (MISCELLANEOUS) ×2
SUCTION TUBE FRAZIER 10FR DISP (MISCELLANEOUS) ×1 IMPLANT
SUT CHROMIC 3 0 PS 2 (SUTURE) ×6 IMPLANT
SUT CHROMIC 4 0 P 3 18 (SUTURE) ×12 IMPLANT
SYR 50ML SLIP (SYRINGE) ×3 IMPLANT
TOWEL OR 17X26 10 PK STRL BLUE (TOWEL DISPOSABLE) ×3 IMPLANT
TUBE CONNECTING 12'X1/4 (SUCTIONS) ×1
TUBE CONNECTING 12X1/4 (SUCTIONS) ×2 IMPLANT
WATER STERILE IRR 1000ML POUR (IV SOLUTION) ×3 IMPLANT
WATER TABLETS ICX (MISCELLANEOUS) ×3 IMPLANT
YANKAUER SUCT BULB TIP NO VENT (SUCTIONS) ×3 IMPLANT

## 2017-08-07 NOTE — Anesthesia Postprocedure Evaluation (Signed)
Anesthesia Post Note  Patient: Reginald [name]  Procedure(s) Performed: Extraction of tooth #'s [phone],11,12,14,19-29 and 32 with alveoloplasty (N/A )     Patient location during evaluation: PACU Anesthesia Type: General Level of consciousness: awake and alert Pain management: pain level controlled Vital Signs Assessment: post-procedure vital signs reviewed and stable Respiratory status: spontaneous breathing, nonlabored ventilation, respiratory function stable and patient connected to nasal cannula oxygen Cardiovascular status: blood pressure returned to baseline and stable Postop Assessment: no apparent nausea or vomiting Anesthetic complications: no    Last Vitals:  Vitals:   08/07/17 0956 08/07/17 1009  BP: 114/70 132/79  Pulse: 97 99  Resp: (!) 25 (!) 24  Temp:  (!) 36.4 C  SpO2: 93% 93%    Last Pain:  Vitals:   08/07/17 1034  TempSrc:   PainSc: 5                  Shanequia Kendrick,W. EDMOND

## 2017-08-07 NOTE — Discharge Instructions (Signed)

## 2017-08-07 NOTE — Progress Notes (Signed)
PROGRESS NOTE  Reginald [name] VWU:[id number] DOB: Feb 15, 1961 DOA: 07/25/2017 PCP: Patient, No Pcp Per  HPI/Recap of past 5 hours:  56 year old male with past medical history significant for COPD, diabetes mellitus, alcohol abuse, diastolic CHF, and AAA who was initially admitted to [facility] on 07/20/17 with nonspecific complaints of generalized weakness, low-grade fever, and muscle aches.  Blood cultures done at admission grew out MRSA and he was started on vancomycin.  2D echocardiogram at that time demonstrated a mitral valve vegetation.  ICU transfer at [facility] for Precedex infusion with presumed alcohol withdrawal was weaned off on 12/3.  Transferred to [facility] hospital for infectious diseases consultation.  On 12/4 found to have a new facial droop.  MRI brain revealed multiple infarcts to both cerebral hemispheres consistent with septic emboli. Transferred to [facility] hospital to continue workup and treatment. 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   Dental surgeon planning extraction of remaining teeth with alveoloplasty and pre-prosthetic surgery in the operating room with general anesthesia for Monday, 08/07/2017 at 7:30 AM if repeated blood cultures negative.  Blood culture x 2 (08/03/17) no growth in 2 days (08/06/17). On IV ceftaroline day# 6, IV daptomycin day # 4, IV rifampin day # 4.  No acute events reported overnight. Dental extraction today 08/07/17. Pt seen and examined at his bedside. He reports gum pain 5-6/10 dull. Required supplemental O2 after procedure and is closely monitored. No other complaints.  Assessment/Plan: Principal Problem:   MRSA bacteremia Active Problems:   Mitral valve vegetation   Acute respiratory failure (HCC)   Alcohol abuse   Thrombocytopenia (HCC)   Abnormal liver enzymes   Acute encephalopathy   Acute diastolic CHF (congestive heart failure) (HCC)  Sepsis (HCC)   Endocarditis of mitral valve   Cerebral septic emboli (HCC)   Hypertensive heart disease with heart failure (HCC)   Endotracheal tube present   Acute respiratory disease   Poor dentition  AMS, resolved, possibly 2/2 to septic emboli -MRI brain 07/26/17: Multiple small areas of restricted diffusion in the brain bilaterally most compatible with acute infarction from emboli. Consider septic emboli given the history of bacteremia. -ID following  MRSA bacteremia  -possible source of infection is oral from severe dental caries-dental surgery following -blood cultures positive 07/25/17, 07/28/17, 07/31/17 -blood cx  12/13/ 18 negative for 4 days -IV ceftaroline, IV daptomycin, IV rifampin day #12 of IV antibiotics -ID following. We appreciate recommendations -afebrile no leukocytosis -cbc am  POD # 0 post dental extraction from Chronic apical periodontitis/Retained root segments/Dental caries/Loose teeth -Dental medicine following. We appreciate recommendations -pain management in place  MV endocarditis -ID, cardiology following -may require M valve replacement. Defer to cardiology -c/w IV antibiotics -right/left heart cath 08/02/17 -TEE 07/27/17: Impressions: Large mass of the A3 segment of the anterior mitral leaflet, prolapsing over the posterior leaflet. Posteriorly located central regurgitant jet suggestive of possible perforation - in the region of the P3 segment. Endocarditis of the posterior leaflet tip is noted. There is a possible small mass on the non-coronary cusp of the aortic valve with trivial AI - this could be resolved endocarditis or sclerosis as well.  CNS, septic emboli -management as stated above  Possible multifocal pneumonia on CT, poa -continue IV antibiotics -IV cetaroline day #7, IV daptomycin day #5, po rifampin day #5  Poor dentition -dental surgeon following -planning extraction of remaining teeth with alveoloplasty  and  pre-prosthetic surgery in the operating room with general anesthesia for Monday, 08/07/2017 at 7:30 AM.   -we appreciate Dental Medicine recommendations  Chronic normocytic anemia -stable -hg 9.5 from 8.7 from 9.1 from 11.2 baseline hg 11 -mcv 96 (08/05/17) -no sign of overt bleeding -cbc am  Severe neck pain, chronic -neck xray in process 08/06/17 -lidocaine patch to apply to neck daily -no radiculopathy   Code Status: Full  Family Communication: No family members at bedside  Disposition Plan: will stay another midnight to continue IV antibiotics.    Consultants:  ID  Cardiology  Dental surgery  Procedures:  right/left [facility] 08/02/17  Antimicrobials:  IV daptomycin  po rifampin  IV ceftaroline  DVT prophylaxis: SCDs Lovenox sq 40 mg daily   Objective: Vitals:   08/06/17 0406 08/06/17 1920 08/06/17 2329 08/07/17 0121  BP: 110/77 119/73 (!) 107/58 110/60  Pulse:      Resp:      Temp: 97.7 F (36.5 C) 98.2 F (36.8 C) 98.9 F (37.2 C) 98.2 F (36.8 C)  TempSrc: Oral Oral Oral Oral  SpO2:      Weight: 105.5 kg (232 lb 9.4 oz)   107.7 kg (237 lb 7 oz)  Height:        Intake/Output Summary (Last 24 hours) at 08/07/2017 0828 Last data filed at 08/07/2017 0128 Gross per 24 hour  Intake 360 ml  Output 1100 ml  Net -740 ml   Filed Weights   08/05/17 0500 08/06/17 0406 08/07/17 0121  Weight: 110 kg (242 lb 8.1 oz) 105.5 kg (232 lb 9.4 oz) 107.7 kg (237 lb 7 oz)    Exam:   General:  56 yo CM WD WN alert and oriented x 3  Cardiovascular: RRR no rubs or gallops  Respiratory: CTA no wheezes or rhonchi  Abdomen: soft NT ND NBS x4   Musculoskeletal: Moves all 4 extremities. Non focal  Skin: no noted open ulcerative lesions  Psychiatry: mood is appropriate for condition and setting   Data Reviewed: CBC: Recent Labs  Lab 08/01/17 0344 08/02/17 0710  08/04/17 0231 08/05/17 0850 08/05/17 2351 08/06/17 0240 08/07/17 0502  WBC 10.2  11.2*   < > 7.2 7.1 7.3 7.4 6.8  NEUTROABS 8.7* 9.9*  --   --   --   --   --   --   HGB 11.2* 11.1*   < > 8.7* 9.5* 9.2* 9.0* 8.5*  HCT 35.5* 35.3*   < > 27.3* 28.9* 28.7* 28.2* 26.3*  MCV 99.4 98.9   < > 95.5 96.3 95.3 94.9 93.3  PLT PLATELET CLUMPS NOTED ON SMEAR, COUNT APPEARS ADEQUATE 351   < > 277 313 309 305 279   < > = values in this interval not displayed.   Basic Metabolic Panel: Recent Labs  Lab 08/01/17 0344 08/02/17 0455 08/03/17 0304 08/04/17 0231 08/05/17 2351 08/07/17 0502  NA  --  140 139 135 131* 132*  K  --  3.6 3.6 3.4* 3.9 3.7  CL  --  106 106 103 98* 99*  CO2  --  27 28 25 25 26   GLUCOSE  --  197* 133* 120* 111* 175*  BUN  --  34* 31* 27* 25* 20  CREATININE  --  0.83 0.84 0.85 0.91 0.91  CALCIUM  --  9.0 8.6* 8.7* 8.5* 8.4*  MG 2.1  --   --   --   --   --   PHOS 3.5  --   --   --   --   --  GFR: Estimated Creatinine Clearance: 108.3 mL/min (by C-G formula based on SCr of 0.91 mg/dL). Liver Function Tests: Recent Labs  Lab 08/03/17 0304  AST 34  ALT 28  ALKPHOS 147*  BILITOT 2.3*  PROT 7.8  ALBUMIN 1.9*   No results for input(s): LIPASE, AMYLASE in the last 168 hours. Recent Labs  Lab 08/02/17 1049  AMMONIA 101*   Coagulation Profile: Recent Labs  Lab 08/02/17 0242  INR 1.25   Cardiac Enzymes: Recent Labs  Lab 08/07/17 0502  CKTOTAL 19*   BNP (last 3 results) No results for input(s): PROBNP in the last 8760 hours. HbA1C: No results for input(s): HGBA1C in the last 72 hours. CBG: Recent Labs  Lab 08/06/17 0555 08/06/17 1142 08/06/17 1708 08/06/17 2021 08/07/17 0556  GLUCAP 138* 142* 132* 190* 168*   Lipid Profile: Recent Labs    08/05/17 0850  TRIG 366*   Thyroid Function Tests: No results for input(s): TSH, T4TOTAL, FREET4, T3FREE, THYROIDAB in the last 72 hours. Anemia Panel: No results for input(s): VITAMINB12, FOLATE, FERRITIN, TIBC, IRON, RETICCTPCT in the last 72 hours. Urine analysis:    Component Value  Date/Time   COLORURINE STRAW (A) 07/28/2017 1330   APPEARANCEUR CLEAR 07/28/2017 1330   LABSPEC 1.010 07/28/2017 1330   PHURINE 5.0 07/28/2017 1330   GLUCOSEU NEGATIVE 07/28/2017 1330   HGBUR NEGATIVE 07/28/2017 1330   BILIRUBINUR NEGATIVE 07/28/2017 1330   KETONESUR NEGATIVE 07/28/2017 1330   PROTEINUR NEGATIVE 07/28/2017 1330   NITRITE NEGATIVE 07/28/2017 1330   LEUKOCYTESUR NEGATIVE 07/28/2017 1330   Sepsis Labs: @LABRCNTIP (procalcitonin:4,lacticidven:4)  ) Recent Results (from the past 240 hour(s))  Culture, blood (Routine X 2) w Reflex to ID Panel     Status: Abnormal   Collection Time: 07/31/17  9:37 AM  Result Value Ref Range Status   Specimen Description BLOOD RIGHT FOREARM  Final   Special Requests   Final    BOTTLES DRAWN AEROBIC AND ANAEROBIC Blood Culture adequate volume   Culture  Setup Time   Final    GRAM POSITIVE COCCI IN CLUSTERS ANAEROBIC BOTTLE ONLY CRITICAL VALUE NOTED.  VALUE IS CONSISTENT WITH PREVIOUSLY REPORTED AND CALLED VALUE. Performed at [facility], [address] [place], [place] [address]    Culture METHICILLIN RESISTANT STAPHYLOCOCCUS AUREUS (A)  Final   Report Status 08/04/2017 FINAL  Final   Organism ID, Bacteria METHICILLIN RESISTANT STAPHYLOCOCCUS AUREUS  Final      Susceptibility   Methicillin resistant staphylococcus aureus - MIC*    CIPROFLOXACIN >=8 RESISTANT Resistant     ERYTHROMYCIN >=8 RESISTANT Resistant     GENTAMICIN <=0.5 SENSITIVE Sensitive     OXACILLIN >=4 RESISTANT Resistant     TETRACYCLINE <=1 SENSITIVE Sensitive     VANCOMYCIN 1 SENSITIVE Sensitive     TRIMETH/SULFA <=10 SENSITIVE Sensitive     CLINDAMYCIN <=0.25 SENSITIVE Sensitive     RIFAMPIN <=0.5 SENSITIVE Sensitive     Inducible Clindamycin NEGATIVE Sensitive     * METHICILLIN RESISTANT STAPHYLOCOCCUS AUREUS  Culture, blood (Routine X 2) w Reflex to ID Panel     Status: Abnormal   Collection Time: 07/31/17  9:37 AM  Result Value Ref Range Status    Specimen Description RIGHT ANTECUBITAL  Final   Special Requests IN PEDIATRIC BOTTLE Blood Culture adequate volume  Final   Culture  Setup Time   Final    GRAM POSITIVE COCCI IN CLUSTERS IN PEDIATRIC BOTTLE CRITICAL VALUE NOTED.  VALUE IS CONSISTENT WITH PREVIOUSLY REPORTED AND  CALLED VALUE.    Culture (A)  Final    STAPHYLOCOCCUS AUREUS SUSCEPTIBILITIES PERFORMED ON PREVIOUS CULTURE WITHIN THE LAST 5 DAYS. Performed at [facility], [address] [place], [place] [address]    Report Status 08/04/2017 FINAL  Final  Culture, blood (Routine X 2) w Reflex to ID Panel     Status: None (Preliminary result)   Collection Time: 08/03/17 12:32 PM  Result Value Ref Range Status   Specimen Description BLOOD LEFT HAND  Final   Special Requests   Final    BOTTLES DRAWN AEROBIC AND ANAEROBIC Blood Culture adequate volume   Culture NO GROWTH 3 DAYS  Final   Report Status PENDING  Incomplete  Culture, blood (Routine X 2) w Reflex to ID Panel     Status: None (Preliminary result)   Collection Time: 08/03/17 12:40 PM  Result Value Ref Range Status   Specimen Description BLOOD LEFT HAND  Final   Special Requests   Final    BOTTLES DRAWN AEROBIC AND ANAEROBIC Blood Culture results may not be optimal due to an inadequate volume of blood received in culture bottles   Culture NO GROWTH 3 DAYS  Final   Report Status PENDING  Incomplete      Studies: Dg Neck Soft Tissue  Result Date: 08/06/2017 CLINICAL DATA:  Throat pain and swelling. Difficulty eating. Extubated on 07/31/2017. EXAM: NECK SOFT TISSUES - 1+ VIEW COMPARISON:  Neck CTA 07/28/2017 FINDINGS: The lateral radiograph suggests mild prominence of the epiglottis, however there is external oxygen tubing directly superimposed over the epiglottis on both lateral radiographs which may contribute to this appearance. The hypopharynx is not distended. No gross laryngeal airway narrowing is identified. The prevertebral soft tissues are within  normal limits. Mild cervical disc degeneration and prominent bilateral carotid artery calcification are noted. There are multiple missing teeth with dental caries more fully evaluated on recent Panorex. IMPRESSION: Slight prominence of the epiglottis, not clearly pathologic and with superimposed external oxygen tubing likely contributing to this appearance. If there were strong clinical concern for epiglottitis or other acute neck infection, neck CT could be performed for further assessment though no secondary findings are present on these radiographs. Electronically Signed   By: Sebastian AcheAllen  Grady M.D.   On: 08/06/2017 14:25    Scheduled Meds: . [MAR Hold] chlorhexidine  15 mL Mouth Rinse BID  . [MAR Hold] folic acid  1 mg Intravenous Daily  . [MAR Hold] furosemide  40 mg Intravenous Q8H  . [MAR Hold] insulin aspart  0-20 Units Subcutaneous TID AC & HS  . [MAR Hold] lidocaine  1 patch Transdermal Q24H  . [MAR Hold] mouth rinse  15 mL Mouth Rinse q12n4p  . [MAR Hold] metoprolol tartrate  25 mg Oral BID  . [MAR Hold] potassium chloride  20 mEq Oral BID  . [MAR Hold] rifampin  300 mg Oral Q12H  . [MAR Hold] sodium chloride flush  3 mL Intravenous Q12H  . [MAR Hold] thiamine  100 mg Intravenous Daily   Or  . [MAR Hold] thiamine  100 mg Oral Daily    Continuous Infusions: . [MAR Hold] sodium chloride    . [MAR Hold] ceFTAROline (TEFLARO) IV Stopped (08/06/17 2320)  . [MAR Hold] DAPTOmycin (CUBICIN)  IV Stopped (08/06/17 1800)     LOS: 13 days     Darlin Droparole N Hall, MD [facility] Pager [phone]  If 7PM-7AM, please contact night-coverage [email or web address] Password [email or web address] 08/07/2017, 8:28 AM

## 2017-08-07 NOTE — Progress Notes (Signed)
PREOPERATIVE NOTE:  08/07/2017 Reginald [name] [id number]  VITALS: BP 110/60 (BP Location: Right Arm)   Pulse 80   Temp 98.2 F (36.8 C) (Oral)   Resp (!) 23   Ht 5\' 9"  (1.753 m)   Wt 237 lb 7 oz (107.7 kg)   SpO2 98%   BMI 35.06 kg/m   Lab Results  Component Value Date   WBC 6.8 08/07/2017   HGB 8.5 (L) 08/07/2017   HCT 26.3 (L) 08/07/2017   MCV 93.3 08/07/2017   PLT 279 08/07/2017   BMET    Component Value Date/Time   NA 131 (L) 08/05/2017 2351   K 3.9 08/05/2017 2351   CL 98 (L) 08/05/2017 2351   CO2 25 08/05/2017 2351   GLUCOSE 111 (H) 08/05/2017 2351   BUN 25 (H) 08/05/2017 2351   CREATININE 0.91 08/05/2017 2351   CALCIUM 8.5 (L) 08/05/2017 2351   GFRNONAA >60 08/05/2017 2351   GFRAA >60 08/05/2017 2351    Lab Results  Component Value Date   INR 1.25 08/02/2017   INR 1.14 07/30/2017   No results found for: PTT   Reginald [name] was recently diagnosed with endocarditis of the mitral valve with severe mitral regurgitation.  Patient with anticipated mitral valve replacement in the future.  Patient with recent cardiac catheterization and clearance for dental procedures in the operating room with general anesthesia per Dr. Delton SeeNelson.  Patient is scheduled for extraction of remaining teeth with alveoloplasty and pre-prosthetic surgery in the operating room with general anesthesia for today at 7:30 AM.     SUBJECTIVE: The patient denies any acute medical or dental changes and agrees to proceed with treatment as planned.  EXAM: No sign of acute dental changes.  ASSESSMENT: Patient is affected by chronic apical periodontitis, multiple retained root segments, dental caries, chronic periodontitis, and loose teeth.  PLAN: Patient agrees to proceed with treatment as planned in the operating room as previously discussed and accepts the risks, benefits, and complications of the proposed treatment. Patient is aware of the risk for bleeding, bruising, swelling,  infection, pain, nerve damage, soft tissue damage, sinus involvement, root tip fracture, mandible fracture, and the risks of complications associated with the anesthesia. Patient also is aware of the potential for other complications up to and including prolonged intubation and possible death due to his overall cardiovascular and respiratory compromise.    [name], DDS

## 2017-08-07 NOTE — Anesthesia Procedure Notes (Signed)
Procedure Name: Intubation Date/Time: 08/07/2017 7:33 AM Performed by: Shireen QuanButler, Johnmark Geiger R, CRNA Pre-anesthesia Checklist: Patient identified, Emergency Drugs available, Suction available and Patient being monitored Patient Re-evaluated:Patient Re-evaluated prior to induction Oxygen Delivery Method: Circle System Utilized Preoxygenation: Pre-oxygenation with 100% oxygen Induction Type: IV induction Ventilation: Mask ventilation without difficulty Laryngoscope Size: Glidescope and 4 Nasal Tubes: Left, Nasal prep performed and Nasal Rae Tube size: 7.5 mm Number of attempts: 1 (16 fr red rubber catheter used to guide ETT through left nare and nasal passage) Airway Equipment and Method: Stylet Placement Confirmation: ETT inserted through vocal cords under direct vision,  positive ETCO2 and breath sounds checked- equal and bilateral Secured at: 28 cm Tube secured with: Tape Dental Injury: Teeth and Oropharynx as per pre-operative assessment

## 2017-08-07 NOTE — Anesthesia Preprocedure Evaluation (Addendum)
Anesthesia Evaluation  Patient identified by MRN, date of birth, ID band Patient awake    Reviewed: Allergy & Precautions, NPO status , Patient's Chart, lab work & pertinent test results, reviewed documented beta blocker date and time   Airway Mallampati: III  TM Distance: >3 FB Neck ROM: Limited    Dental  (+) Poor Dentition, Dental Advisory Given   Pulmonary Current Smoker,     + decreased breath sounds  rales    Cardiovascular +CHF   Rhythm:Regular Rate:Normal + Systolic murmurs    Neuro/Psych    GI/Hepatic   Endo/Other  diabetes, Well Controlled, Type 2  Renal/GU      Musculoskeletal   Abdominal   Peds  Hematology   Anesthesia Other Findings   Reproductive/Obstetrics                            Anesthesia Physical Anesthesia Plan  ASA: III  Anesthesia Plan: General   Post-op Pain Management:    Induction:   PONV Risk Score and Plan: 2 and Ondansetron and Dexamethasone  Airway Management Planned: Nasal ETT  Additional Equipment: None  Intra-op Plan:   Post-operative Plan: Extubation in OR  Informed Consent: I have reviewed the patients History and Physical, chart, labs and discussed the procedure including the risks, benefits and alternatives for the proposed anesthesia with the patient or authorized representative who has indicated his/her understanding and acceptance.   Dental advisory given  Plan Discussed with: CRNA, Anesthesiologist and Surgeon  Anesthesia Plan Comments:        Anesthesia Quick Evaluation

## 2017-08-07 NOTE — Progress Notes (Signed)
Pharmacy Antibiotic Note  Reginald [name] is a 56 y.o. male admitted on 07/25/2017 with MRSA bacteremia and endocarditis.  Pharmacy has been consulted for Daptomycin dosing, also on ceftaroline and rifampin per ID.  Blood cx from 12/13 are no growth to date. Pt is afebrile and WBC WNL. SCr is WNL and CK is ok. Teeth extraction completed 12/17.  Plan: Continue daptomycin 1gm (~10mg /kg) IV Q24H Continue ceftaroline 600mg  IV q12h + rifampin PO per MD F/u renal fxn, C&S, clinical status, and planned LOT Weekly CK - due 12/24   Height: 5\' 9"  (175.3 cm) Weight: 237 lb 7 oz (107.7 kg) IBW/kg (Calculated) : 70.7  Temp (24hrs), Avg:98.3 F (36.8 C), Min:97.5 F (36.4 C), Max:98.9 F (37.2 C)  Recent Labs  Lab 08/02/17 0455  08/03/17 0304 08/04/17 0231 08/05/17 0850 08/05/17 2351 08/06/17 0240 08/07/17 0502  WBC  --    < > 8.1 7.2 7.1 7.3 7.4 6.8  CREATININE 0.83  --  0.84 0.85  --  0.91  --  0.91   < > = values in this interval not displayed.    Estimated Creatinine Clearance: 108.3 mL/min (by C-G formula based on SCr of 0.91 mg/dL).    Allergies  Allergen Reactions  . Morphine And Related     Antimicrobials this admission: 12/4 Vanc ([facility]) >> 12/10 12/10 daptomycin >> 12/10 ceftaroline >> 12/12 rifampin>>  Dose adjustments this admission: 12/7 1800 Vancomycin peak level: 42 12/8 0230 Vancomycin trough level: 38  Microbiology [facility] BCx: MRSA (S bactrim, vanc, dapto, linezolid, TCN) - per Dr. Feliz BeamSnider's note  12/4 BCx: mrsa - Vanc MIC < 0.5 12/5 HIV Ab: negative 12/6 MRSA PCR: positive 12/7 BCx: 2/2 SA 12/10 BCx: MRSA 12/13 blood- ngtd  Babs BertinHaley Kathaleen Dudziak, PharmD, BCPS Clinical Pharmacist Clinical phone for 08/07/2017 until 3:30pm: [phone] If after 3:30pm, please call main pharmacy at: [phone] 08/07/2017 12:02 PM

## 2017-08-07 NOTE — Transfer of Care (Signed)
Immediate Anesthesia Transfer of Care Note  Patient: Reginald [name]  Procedure(s) Performed: Extraction of tooth #'s [phone],11,12,14,19-29 and 32 with alveoloplasty (N/A )  Patient Location: PACU  Anesthesia Type:General  Level of Consciousness: awake, oriented and patient cooperative  Airway & Oxygen Therapy: Patient Spontanous Breathing and Patient connected to face mask oxygen  Post-op Assessment: Report given to RN, Post -op Vital signs reviewed and stable and Patient moving all extremities  Post vital signs: Reviewed and stable  Last Vitals:  Vitals:   08/06/17 2329 08/07/17 0121  BP: (!) 107/58 110/60  Pulse:    Resp:    Temp: 37.2 C 36.8 C  SpO2:      Last Pain:  Vitals:   08/07/17 0121  TempSrc: Oral  PainSc:          Complications: No apparent anesthesia complications

## 2017-08-07 NOTE — Progress Notes (Signed)
Rehab admissions - Noted plans for MV replacement one week from teeth extractions.  I will hold on inpatient rehab consideration at this time.  Call me for questions.  #[phone]

## 2017-08-07 NOTE — Progress Notes (Signed)
Day of Surgery Procedure(s) (LRB): Extraction of tooth #'s 6,11,12,14,19-29 and 32 with alveoloplasty (N/A) Subjective: Endocarditis mitral valve with severe MR Dental extractions of necrotic teeth completed today Plan mitral valve replacement next Monday, December 24 Patient will need to be ambulatory prior to surgery Will follow     Objective: Vital signs in last 24 hours: Temp:  [97.5 F (36.4 C)-98.9 F (37.2 C)] 98.3 F (36.8 C) (12/17 1200) Pulse Rate:  [88-103] 88 (12/17 1400) Cardiac Rhythm: Normal sinus rhythm (12/17 1600) Resp:  [20-25] 20 (12/17 1400) BP: (107-135)/(58-84) 110/82 (12/17 1300) SpO2:  [79 %-97 %] 79 % (12/17 1400) Weight:  [237 lb 7 oz (107.7 kg)] 237 lb 7 oz (107.7 kg) (12/17 0121)  Hemodynamic parameters for last 24 hours:    Intake/Output from previous day: 12/16 0701 - 12/17 0700 In: 360 [P.O.:240; IV Piggyback:120] Out: 1100 [Urine:1100] Intake/Output this shift: Total I/O In: 17 [I.V.:17] Out: 1605 [Urine:1505; Blood:100]    Lab Results: Recent Labs    08/06/17 0240 08/07/17 0502  WBC 7.4 6.8  HGB 9.0* 8.5*  HCT 28.2* 26.3*  PLT 305 279   BMET:  Recent Labs    08/05/17 2351 08/07/17 0502  NA 131* 132*  K 3.9 3.7  CL 98* 99*  CO2 25 26  GLUCOSE 111* 175*  BUN 25* 20  CREATININE 0.91 0.91  CALCIUM 8.5* 8.4*    PT/INR: No results for input(s): LABPROT, INR in the last 72 hours. ABG    Component Value Date/Time   PHART 7.505 (H) 08/02/2017 1553   HCO3 29.9 (H) 08/02/2017 1553   TCO2 31 08/02/2017 1553   O2SAT 93.0 08/02/2017 1553   CBG (last 3)  Recent Labs    08/07/17 0940 08/07/17 1127 08/07/17 1718  GLUCAP 166* 208* 173*    Assessment/Plan: S/P Procedure(s) (LRB): Extraction of tooth #'s 6,11,12,14,19-29 and 32 with alveoloplasty (N/A) Mitral valve replacement with bioprosthetic valve Monday December 24 Continue antibiotics, nutrition, mobilization Check chest x-ray in a.m.  LOS: 13 days    Kathlee Nationseter  Van Trigt III 08/07/2017

## 2017-08-07 NOTE — Progress Notes (Signed)
Progress Note  Patient Name: Reginald [name] Date of Encounter: 08/07/2017  Primary Cardiologist: Chrystie NoseKenneth C Hilty, MD   Subjective   Seen shortly after Extraction of tooth #'s [id number] [id number],11,12,14,19-29 and 32 with alveoloplasty.  Awake, alert, conversant. Mildly tachycardic and mildly tachypneic. Moderate discomfort from extractions.   Inpatient Medications    Scheduled Meds: . chlorhexidine  15 mL Mouth Rinse BID  . folic acid  1 mg Intravenous Daily  . furosemide  40 mg Intravenous Q8H  . insulin aspart  0-20 Units Subcutaneous TID AC & HS  . lidocaine  1 patch Transdermal Q24H  . mouth rinse  15 mL Mouth Rinse q12n4p  . metoprolol tartrate  25 mg Oral BID  . potassium chloride  20 mEq Oral BID  . rifampin  300 mg Oral Q12H  . sodium chloride flush  3 mL Intravenous Q12H  . thiamine  100 mg Intravenous Daily   Or  . thiamine  100 mg Oral Daily   Continuous Infusions: . sodium chloride    . ceFTAROline (TEFLARO) IV Stopped (08/06/17 2320)  . DAPTOmycin (CUBICIN)  IV Stopped (08/06/17 1800)   PRN Meds: sodium chloride, acetaminophen, hydrALAZINE, ipratropium-albuterol, [DISCONTINUED] ondansetron **OR** ondansetron (ZOFRAN) IV, sodium chloride flush   Vital Signs    Vitals:   08/07/17 0941 08/07/17 0956 08/07/17 1009 08/07/17 1100  BP: 122/69 114/70 132/79 135/84  Pulse: 96 97 99 100  Resp: (!) 24 (!) 25 (!) 24 (!) 25  Temp:   (!) 97.5 F (36.4 C)   TempSrc:      SpO2: 96% 93% 93% 97%  Weight:      Height:        Intake/Output Summary (Last 24 hours) at 08/07/2017 1139 Last data filed at 08/07/2017 1000 Gross per 24 hour  Intake 377 ml  Output 1405 ml  Net -1028 ml   Filed Weights   08/05/17 0500 08/06/17 0406 08/07/17 0121  Weight: 242 lb 8.1 oz (110 kg) 232 lb 9.4 oz (105.5 kg) 237 lb 7 oz (107.7 kg)    Telemetry    Mild sinus tachycardia - Personally Reviewed  ECG    No new tracing - Personally Reviewed  Physical Exam  Obese GEN: No acute  distress.   Neck: No JVD Cardiac: RRR, 3/6 apical holosystolic murmur radiating towards the base, no diastolic murmurs, rubs, or gallops.  Respiratory: Clear to auscultation bilaterally. GI: Soft, nontender, non-distended  MS: No edema; No deformity. Neuro:  Nonfocal  Psych: Normal affect   Labs    Chemistry Recent Labs  Lab 08/03/17 0304 08/04/17 0231 08/05/17 2351 08/07/17 0502  NA 139 135 131* 132*  K 3.6 3.4* 3.9 3.7  CL 106 103 98* 99*  CO2 28 25 25 26   GLUCOSE 133* 120* 111* 175*  BUN 31* 27* 25* 20  CREATININE 0.84 0.85 0.91 0.91  CALCIUM 8.6* 8.7* 8.5* 8.4*  PROT 7.8  --   --   --   ALBUMIN 1.9*  --   --   --   AST 34  --   --   --   ALT 28  --   --   --   ALKPHOS 147*  --   --   --   BILITOT 2.3*  --   --   --   GFRNONAA >60 >60 >60 >60  GFRAA >60 >60 >60 >60  ANIONGAP 5 7 8 7      Hematology Recent Labs  Lab 08/05/17 2351 08/06/17 0240 08/07/17  0502  WBC 7.3 7.4 6.8  RBC 3.01* 2.97* 2.82*  HGB 9.2* 9.0* 8.5*  HCT 28.7* 28.2* 26.3*  MCV 95.3 94.9 93.3  MCH 30.6 30.3 30.1  MCHC 32.1 31.9 32.3  RDW 13.8 13.7 13.8  PLT 309 305 279    Cardiac EnzymesNo results for input(s): TROPONINI in the last 168 hours. No results for input(s): TROPIPOC in the last 168 hours.   BNPNo results for input(s): BNP, PROBNP in the last 168 hours.   DDimer No results for input(s): DDIMER in the last 168 hours.   Radiology    Dg Neck Soft Tissue  Result Date: 08/06/2017 CLINICAL DATA:  Throat pain and swelling. Difficulty eating. Extubated on 07/31/2017. EXAM: NECK SOFT TISSUES - 1+ VIEW COMPARISON:  Neck CTA 07/28/2017 FINDINGS: The lateral radiograph suggests mild prominence of the epiglottis, however there is external oxygen tubing directly superimposed over the epiglottis on both lateral radiographs which may contribute to this appearance. The hypopharynx is not distended. No gross laryngeal airway narrowing is identified. The prevertebral soft tissues are within  normal limits. Mild cervical disc degeneration and prominent bilateral carotid artery calcification are noted. There are multiple missing teeth with dental caries more fully evaluated on recent Panorex. IMPRESSION: Slight prominence of the epiglottis, not clearly pathologic and with superimposed external oxygen tubing likely contributing to this appearance. If there were strong clinical concern for epiglottitis or other acute neck infection, neck CT could be performed for further assessment though no secondary findings are present on these radiographs. Electronically Signed   By: Sebastian AcheAllen  Grady M.D.   On: 08/06/2017 14:25    Cardiac Studies   Cardiac catheterization 08/02/2017:  Mid RCA lesion is 40% stenosed.  Ost 2nd Mrg to 2nd Mrg lesion is 20% stenosed.  Hemodynamic findings consistent with moderate pulmonary hypertension.  1. Mild non-obstructive CAD  TEE 07/27/2017: Study Conclusions  - Left ventricle: Normal LV size. Moderate LVH. Systolic function was normal. The estimated ejection fraction was in the range of 60% to 65%. Wall motion was normal; there were no regional wall motion abnormalities. - Aortic valve: Trileaflet. Small echogenic mass at the tip of the non-coronary cusp. lambl&'s excrescences. Trivial AI. - Aorta: Grade 3 mobile atheroma of the proximal aortic arch. - Mitral valve: Large 2-3 cm mass of the A3 segment of the anterior leaflet, prolapsing across the posterior leaflet. There is a distinct posterior jet which is centrally directed of severe regurgitation, suggestive of perforation. There is dropout seen through the vegetation or in the area of the P3 scallop - not entirely clear, but concerning for possible perforation. There is vegetation to a lesser extent on the posterior leaflet tip. Effective regurgitant orifice (PISA): 1.01 cm^2. Regurgitant volume (PISA): 155 ml. - Left atrium: The atrium was dilated. No evidence of thrombus  in the atrial cavity or appendage. - Right atrium: No evidence of thrombus in the atrial cavity or appendage. - Atrial septum: No defect or patent foramen ovale was identified. - Pulmonic valve: No evidence of vegetation.  Impressions:  - Large mass of the A3 segment of the anterior mitral leaflet, prolapsing over the posterior leaflet. Posteriorly located central regurgitant jet suggestive of possible perforation - in the region of the P3 segment. Endocarditis of the posterior leaflet tip is noted. There is a possible small mass on the non-coronary cusp of the aortic valve with trivial AI - this could be resolved endocarditis or sclerosis as well.  Patient Profile     56 y.o.  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  Assessment & Plan    1. CHF: due to MR, not overtly volume overloaded (exam difficult due to obesity), rapid heart rate and breathing likely due to discomfort from recent surgery. Weight with some variation, but generally 237-239 over last 10 days. Continue current diuretic regimen. 2. 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 Was evaluated by Dr. Donata Clay. 3. HTN: controlled.  For questions or updates, please contact [facility] Please consult [email or web address] for contact info under Cardiology/STEMI.      Signed, Thurmon Fair, MD  08/07/2017, 11:39 AM

## 2017-08-07 NOTE — Op Note (Signed)
OPERATIVE REPORT  Patient:            Reginald [name] Date of Birth:  03/24/1961 MRN:                [id number]   DATE OF PROCEDURE:  08/07/2017  PREOPERATIVE DIAGNOSES: 1.  Mitral valve endocarditis 2.  Severe mitral regurgitation 3.  Pre-heart valve surgery dental protocol 4.  Chronic apical periodontitis 5.  Retained root segments 6.  Dental caries 7   Chronic periodontitis 8.  Loose teeth  POSTOPERATIVE DIAGNOSES: 1.  Mitral valve endocarditis 2.  Severe mitral regurgitation 3.  Pre-heart valve surgery dental protocol 4.  Chronic apical periodontitis 5.  Retained root segments 6.  Dental caries 7   Chronic periodontitis 8.  Loose teeth  OPERATIONS: 1. Multiple extraction of tooth numbers 6,11,12,14,19-29 and 32 2. 4 Quadrants of alveoloplasty   SURGEON: Charlynne Panderonald F. Brinley Rosete, DDS  ANESTHESIA: General anesthesia via nasoendotracheal tube.  MEDICATIONS: 1.  Antibiotic therapy per previous regimen for endocarditis  2. Local anesthesia with a total utilization of 4 carpules each containing 34 mg of lidocaine with 0.017 mg of epinephrine as well as 2 carpules each containing 9 mg of bupivacaine with 0.009 mg of epinephrine.  SPECIMENS: There are 16 teeth that were discarded.  DRAINS: None  CULTURES: None  COMPLICATIONS: None   ESTIMATED BLOOD LOSS: 100 mLs.  INTRAVENOUS FLUIDS: 500 mLs of Lactated ringers solution.  INDICATIONS: The patient was recently diagnosed with mitral valve endocarditis with severe mitral regurgitation.  A dental consultation was then requested to evaluate poor dentition.  The patient was examined and treatment planned for extraction of remaining teeth with alveoloplasty in the operating room with general anesthesia.  This treatment plan was formulated to decrease the risks and complications associated with dental infection from further affecting the patient's systemic health and the anticipated heart valve surgery.  OPERATIVE  FINDINGS: Patient was examined operating room number 10.  The teeth were identified for extraction.  It was determined that tooth numbers 6, 11, 12, 14, 19 through 29, and 32 would need to be extracted at this time.  The patient was noted be affected by chronic periodontitis, loose teeth, chronic apical periodontitis, retained root segments, and dental caries.   DESCRIPTION OF PROCEDURE: Patient was brought to the main operating room number 10. Patient was then placed in the supine position on the operating table. General anesthesia was then induced per the anesthesia team. The patient was then prepped and draped in the usual manner for a dental medicine procedure. A timeout was performed. The patient was identified and procedures were verified. A throat pack was placed at this time. The oral cavity was then thoroughly examined with the findings noted above. The patient was then ready for dental medicine procedure as follows:  Local anesthesia was then administered sequentially with a total utilization of 4 carpules each containing 34 mg of lidocaine with 0.017 mg of epinephrine as well as 2 carpules  each containing 9 mg bupivacaine with 0.009 mg of epinephrine.  The Maxillary left and right quadrants first approached. Anesthesia was then delivered utilizing infiltration with lidocaine with epinephrine. A #15 blade incision was then made from the distal of #4 and extended to the mesial of #8.  A second 15 blade incision was then made from the mesial of #9 and extended to the distal of #15. Surgical flaps were then carefully reflected.   The teeth were then subluxated with a series of straight elevators. Tooth  numbers 6,11,12,and 14 were then removed with a 150 forceps without complications. Alveoloplasty was then performed utilizing a ronguers and bone file to help achieve primary closure in the maxillary right and maxillary left quadrants appropriately. The surgical sites were then irrigated with copious  amounts of sterile saline. The tissues were approximated and trimmed appropriately.  A piece of Surgifoam was placed in the extraction socket area #6 and #14 appropriately.  The maxillary right surgical site was then closed from the distal of #4 extend to the mesial #8 utilizing 3-0 chromic gut suture in a continuous interrupted suture technique x1.  The maxillary left surgical site was then closed from the maxillary left area #15 extended to the mesial #9 utilizing 3-0 chromic gut suture in a continuous interrupted suture technique x1.  At this point time, the mandibular quadrants were approached. The patient was given bilateral inferior alveolar nerve blocks and long buccal nerve blocks utilizing the bupivacaine with epinephrine. Further infiltration was then achieved utilizing the lidocaine with epinephrine. A 15 blade incision was then made from the distal of number 17 and extended to the distal of #32.  A surgical flap was then carefully reflected.  The lower teeth were then subluxated with a series of straight elevators.  Appropriate amounts of buccal and interseptal bone were then removed utilizing a surgical handpiece and copious amount of sterile water around tooth numbers 20, 21, 22, 27, and 32.  The lower teeth were then again subluxated with a series of straight elevators.   Tooth numbers 19, 20, 21, 22, 23, 24, 25, 26, 27, 28, 29 we then removed with a 151 forceps without complications.  Tooth #32 was then removed with a 23 forceps without complications.  Alveoloplasty was then performed utilizing a rongeurs and bone file to help achieve primary closure in the lower left and lower right quadrants. The tissues were approximated and trimmed appropriately. The surgical sites were then irrigated with copious amounts of sterile saline X 4. The mandibular left surgical site was then closed from the distal of 17 and extended to the mesial of #24 utilizing 3-0 chromic gut suture in a continuous suture  technique x1. The mandibular right surgical site was then closed from the distal of 32 and extended to the mesial of 25 utilizing 3-0 chromic gut suture in a continuous interrupted suture technique x1.  1 additional interrupted sutures then placed to further close the surgical site on the facial aspect of tooth #27.  At this point time, the entire mouth was irrigated with copious amounts of sterile saline. The patient was examined for complications, seeing none, the dental medicine procedure was deemed to be complete. The throat pack was removed at this time. A series of 4 x 4 gauze were placed in the mouth to aid hemostasis. The patient was then handed over to the anesthesia team for final disposition. After an appropriate amount of time, the patient was extubated and taken to the postanesthsia care unit in good condition. All counts were correct for the dental medicine procedure.   Charlynne Panderonald F. Chinwe Lope, DDS.

## 2017-08-08 ENCOUNTER — Encounter (HOSPITAL_COMMUNITY): Payer: Self-pay | Admitting: Dentistry

## 2017-08-08 ENCOUNTER — Inpatient Hospital Stay (HOSPITAL_COMMUNITY): Payer: Medicaid Other

## 2017-08-08 DIAGNOSIS — J189 Pneumonia, unspecified organism: Secondary | ICD-10-CM

## 2017-08-08 DIAGNOSIS — R011 Cardiac murmur, unspecified: Secondary | ICD-10-CM

## 2017-08-08 LAB — BASIC METABOLIC PANEL
ANION GAP: 7 (ref 5–15)
BUN: 25 mg/dL — AB (ref 6–20)
CALCIUM: 8.5 mg/dL — AB (ref 8.9–10.3)
CHLORIDE: 100 mmol/L — AB (ref 101–111)
CO2: 27 mmol/L (ref 22–32)
CREATININE: 0.93 mg/dL (ref 0.61–1.24)
GFR calc Af Amer: >60 mL/min (ref >60)
GFR calc non Af Amer: >60 mL/min (ref >60)
GLUCOSE: 136 mg/dL — AB (ref 65–99)
POTASSIUM: 3.4 mmol/L — AB (ref 3.5–5.1)
Sodium: 134 mmol/L — ABNORMAL LOW (ref 135–145)

## 2017-08-08 LAB — TRIGLYCERIDES: TRIGLYCERIDES: 290 mg/dL — AB (ref <150)

## 2017-08-08 LAB — GLUCOSE, CAPILLARY
GLUCOSE-CAPILLARY: 152 mg/dL — AB (ref 65–99)
GLUCOSE-CAPILLARY: 139 mg/dL — AB (ref 65–99)
Glucose-Capillary: 256 mg/dL — ABNORMAL HIGH (ref 65–99)
Glucose-Capillary: 205 mg/dL — ABNORMAL HIGH (ref 65–99)

## 2017-08-08 LAB — CULTURE, BLOOD (ROUTINE X 2)
CULTURE: NO GROWTH
CULTURE: NO GROWTH
Special Requests: ADEQUATE

## 2017-08-08 LAB — CBC
HEMATOCRIT: 27 % — AB (ref 39.0–52.0)
Hemoglobin: 8.6 g/dL — ABNORMAL LOW (ref 13.0–17.0)
MCH: 30.1 pg (ref 26.0–34.0)
MCHC: 31.9 g/dL (ref 30.0–36.0)
MCV: 94.4 fL (ref 78.0–100.0)
PLATELETS: 295 10*3/uL (ref 150–400)
RBC: 2.86 MIL/uL — AB (ref 4.22–5.81)
RDW: 14.2 % (ref 11.5–15.5)
WBC: 7.3 10*3/uL (ref 4.0–10.5)

## 2017-08-08 MED ORDER — GADOBENATE DIMEGLUMINE 529 MG/ML IV SOLN
20.0000 mL | Freq: Once | INTRAVENOUS | Status: AC
  Administered 2017-08-08: 20 mL via INTRAVENOUS

## 2017-08-08 MED ORDER — ENSURE ENLIVE PO LIQD
237.0000 mL | Freq: Two times a day (BID) | ORAL | Status: DC
  Administered 2017-08-08 – 2017-08-10 (×4): 237 mL via ORAL

## 2017-08-08 NOTE — Progress Notes (Signed)
Progress Note  Patient Name: Reginald [name] Date of Encounter: 08/08/2017  Primary Cardiologist: Chrystie Nose, MD   Subjective   Recovering well after tooth extraction. No dyspnea at rest, lying flat. Able to briefly stand and transfer with assistance.  Inpatient Medications    Scheduled Meds: . chlorhexidine  15 mL Mouth Rinse BID  . folic acid  1 mg Intravenous Daily  . furosemide  40 mg Intravenous Q8H  . insulin aspart  0-20 Units Subcutaneous TID AC & HS  . lidocaine  1 patch Transdermal Q24H  . metoprolol tartrate  25 mg Oral BID  . potassium chloride  20 mEq Oral BID  . rifampin  300 mg Oral Q12H  . sodium chloride flush  3 mL Intravenous Q12H  . thiamine  100 mg Intravenous Daily   Or  . thiamine  100 mg Oral Daily   Continuous Infusions: . sodium chloride    . ceFTAROline (TEFLARO) IV Stopped (08/07/17 2339)  . DAPTOmycin (CUBICIN)  IV Stopped (08/07/17 1510)  . lactated ringers     PRN Meds: sodium chloride, acetaminophen, hydrALAZINE, HYDROmorphone (DILAUDID) injection, ipratropium-albuterol, [DISCONTINUED] ondansetron **OR** ondansetron (ZOFRAN) IV, sodium chloride flush   Vital Signs    Vitals:   08/08/17 0000 08/08/17 0400 08/08/17 0442 08/08/17 0621  BP: 131/74 114/63    Pulse: 80 80    Resp: 18 (!) 22    Temp: 97.8 F (36.6 C) 98.4 F (36.9 C)    TempSrc: Axillary Oral    SpO2: 98% 100%    Weight:   242 lb 8.1 oz (110 kg) 231 lb (104.8 kg)  Height:        Intake/Output Summary (Last 24 hours) at 08/08/2017 0941 Last data filed at 08/08/2017 0615 Gross per 24 hour  Intake 0 ml  Output 2105 ml  Net -2105 ml   Filed Weights   08/07/17 0121 08/08/17 0442 08/08/17 0621  Weight: 237 lb 7 oz (107.7 kg) 242 lb 8.1 oz (110 kg) 231 lb (104.8 kg)    Telemetry    NSR/mild sinus tachy - Personally Reviewed  ECG    No new tracing - Personally Reviewed  Physical Exam  Obese GEN: No acute distress.   Neck: No JVD Cardiac: RRR, 3/6  apical holosystolic murmur radiating towards the base, no diastolic murmurs rubs, or gallops.  Respiratory: Clear to auscultation bilaterally. GI: Soft, nontender, non-distended  MS: No edema; No deformity. Neuro:  Nonfocal  Psych: Normal affect   Labs    Chemistry Recent Labs  Lab 08/03/17 0304  08/05/17 2351 08/07/17 0502 08/08/17 0407  NA 139   < > 131* 132* 134*  K 3.6   < > 3.9 3.7 3.4*  CL 106   < > 98* 99* 100*  CO2 28   < > 25 26 27   GLUCOSE 133*   < > 111* 175* 136*  BUN 31*   < > 25* 20 25*  CREATININE 0.84   < > 0.91 0.91 0.93  CALCIUM 8.6*   < > 8.5* 8.4* 8.5*  PROT 7.8  --   --   --   --   ALBUMIN 1.9*  --   --   --   --   AST 34  --   --   --   --   ALT 28  --   --   --   --   ALKPHOS 147*  --   --   --   --  BILITOT 2.3*  --   --   --   --   GFRNONAA >60   < > >60 >60 >60  GFRAA >60   < > >60 >60 >60  ANIONGAP 5   < > 8 7 7    < > = values in this interval not displayed.     Hematology Recent Labs  Lab 08/06/17 0240 08/07/17 0502 08/08/17 0407  WBC 7.4 6.8 7.3  RBC 2.97* 2.82* 2.86*  HGB 9.0* 8.5* 8.6*  HCT 28.2* 26.3* 27.0*  MCV 94.9 93.3 94.4  MCH 30.3 30.1 30.1  MCHC 31.9 32.3 31.9  RDW 13.7 13.8 14.2  PLT 305 279 295    Cardiac EnzymesNo results for input(s): TROPONINI in the last 168 hours. No results for input(s): TROPIPOC in the last 168 hours.   BNPNo results for input(s): BNP, PROBNP in the last 168 hours.   DDimer No results for input(s): DDIMER in the last 168 hours.   Radiology    Dg Neck Soft Tissue  Result Date: 08/06/2017 CLINICAL DATA:  Throat pain and swelling. Difficulty eating. Extubated on 07/31/2017. EXAM: NECK SOFT TISSUES - 1+ VIEW COMPARISON:  Neck CTA 07/28/2017 FINDINGS: The lateral radiograph suggests mild prominence of the epiglottis, however there is external oxygen tubing directly superimposed over the epiglottis on both lateral radiographs which may contribute to this appearance. The hypopharynx is not  distended. No gross laryngeal airway narrowing is identified. The prevertebral soft tissues are within normal limits. Mild cervical disc degeneration and prominent bilateral carotid artery calcification are noted. There are multiple missing teeth with dental caries more fully evaluated on recent Panorex. IMPRESSION: Slight prominence of the epiglottis, not clearly pathologic and with superimposed external oxygen tubing likely contributing to this appearance. If there were strong clinical concern for epiglottitis or other acute neck infection, neck CT could be performed for further assessment though no secondary findings are present on these radiographs. Electronically Signed   By: Sebastian AcheAllen  Grady M.D.   On: 08/06/2017 14:25    Cardiac Studies    Cardiac catheterization 08/02/2017:  Mid RCA lesion is 40% stenosed.  Ost 2nd Mrg to 2nd Mrg lesion is 20% stenosed.  Hemodynamic findings consistent with moderate pulmonary hypertension.  1. Mild non-obstructive CAD  TEE 07/27/2017: Study Conclusions  - Left ventricle: Normal LV size. Moderate LVH. Systolic function was normal. The estimated ejection fraction was in the range of 60% to 65%. Wall motion was normal; there were no regional wall motion abnormalities. - Aortic valve: Trileaflet. Small echogenic mass at the tip of the non-coronary cusp. lambl&'s excrescences. Trivial AI. - Aorta: Grade 3 mobile atheroma of the proximal aortic arch. - Mitral valve: Large 2-3 cm mass of the A3 segment of the anterior leaflet, prolapsing across the posterior leaflet. There is a distinct posterior jet which is centrally directed of severe regurgitation, suggestive of perforation. There is dropout seen through the vegetation or in the area of the P3 scallop - not entirely clear, but concerning for possible perforation. There is vegetation to a lesser extent on the posterior leaflet tip. Effective regurgitant orifice (PISA): 1.01  cm^2. Regurgitant volume (PISA): 155 ml. - Left atrium: The atrium was dilated. No evidence of thrombus in the atrial cavity or appendage. - Right atrium: No evidence of thrombus in the atrial cavity or appendage. - Atrial septum: No defect or patent foramen ovale was identified. - Pulmonic valve: No evidence of vegetation.  Impressions:  - Large mass of the A3 segment of  the anterior mitral leaflet, prolapsing over the posterior leaflet. Posteriorly located central regurgitant jet suggestive of possible perforation - in the region of the P3 segment. Endocarditis of the posterior leaflet tip is noted. There is a possible small mass on the non-coronary cusp of the aortic valve with trivial AI - this could be resolved endocarditis or sclerosis as well    Patient Profile     56 y.o. 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  Assessment & Plan    1. CHF: due to MR, probably euvolemic. Weight with some variation, but generally 237-239 over last 10 days. Continue current diuretic regimen.  2. 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 Donata Clayvan Trigt. 3. HTN: controlled. 4. Deconditioning: PT ordered, plan to get him ambulatory by time of surgery.  For questions or updates, please contact [facility] Please consult [email or web address] for contact info under Cardiology/STEMI.      Signed, Thurmon FairMihai Velda Wendt, MD  08/08/2017, 9:41 AM

## 2017-08-08 NOTE — Progress Notes (Signed)
Subjective: No new complaints   Antibiotics:  Anti-infectives (From admission, onward)   Start     Dose/Rate Route Frequency Ordered Stop   08/02/17 1400  DAPTOmycin (CUBICIN) 1,000 mg in sodium chloride 0.9 % IVPB     1,000 mg 240 mL/hr over 30 Minutes Intravenous Every 24 hours 08/02/17 1100     08/02/17 1000  rifampin (RIFADIN) capsule 300 mg  Status:  Discontinued     300 mg Oral Every 12 hours 08/02/17 0912 08/08/17 1538   07/31/17 1400  DAPTOmycin (CUBICIN) 700 mg in sodium chloride 0.9 % IVPB  Status:  Discontinued     700 mg 228 mL/hr over 30 Minutes Intravenous Every 24 hours 07/31/17 1254 08/02/17 1100   07/31/17 1330  ceftaroline (TEFLARO) 600 mg in sodium chloride 0.9 % 250 mL IVPB     600 mg 250 mL/hr over 60 Minutes Intravenous Every 12 hours 07/31/17 1240     07/30/17 1000  vancomycin (VANCOCIN) 1,750 mg in sodium chloride 0.9 % 500 mL IVPB  Status:  Discontinued     1,750 mg 250 mL/hr over 120 Minutes Intravenous Every 24 hours 07/30/17 0449 07/31/17 1238   07/29/17 1500  vancomycin (VANCOCIN) 1,500 mg in sodium chloride 0.9 % 500 mL IVPB     1,500 mg 250 mL/hr over 120 Minutes Intravenous Every 12 hours 07/29/17 0704 07/29/17 1818   07/26/17 1400  vancomycin (VANCOCIN) 1,500 mg in sodium chloride 0.9 % 500 mL IVPB  Status:  Discontinued     1,500 mg 250 mL/hr over 120 Minutes Intravenous Every 12 hours 07/26/17 1254 07/29/17 0247   07/25/17 2200  vancomycin (VANCOCIN) 2,500 mg in sodium chloride 0.9 % 500 mL IVPB  Status:  Discontinued     2,500 mg 250 mL/hr over 120 Minutes Intravenous  Once 07/25/17 2124 07/25/17 2210   07/25/17 2130  vancomycin (VANCOCIN) IVPB 1000 mg/200 mL premix  Status:  Discontinued     1,000 mg 200 mL/hr over 60 Minutes Intravenous  Once 07/25/17 2118 07/25/17 2123      Medications: Scheduled Meds: . chlorhexidine  15 mL Mouth Rinse BID  . feeding supplement (ENSURE ENLIVE)  237 mL Oral BID BM  . folic acid  1 mg  Intravenous Daily  . furosemide  40 mg Intravenous Q8H  . insulin aspart  0-20 Units Subcutaneous TID AC & HS  . lidocaine  1 patch Transdermal Q24H  . metoprolol tartrate  25 mg Oral BID  . potassium chloride  20 mEq Oral BID  . sodium chloride flush  3 mL Intravenous Q12H  . thiamine  100 mg Intravenous Daily   Or  . thiamine  100 mg Oral Daily   Continuous Infusions: . sodium chloride    . ceFTAROline (TEFLARO) IV Stopped (08/08/17 1255)  . DAPTOmycin (CUBICIN)  IV Stopped (08/07/17 1510)  . lactated ringers     PRN Meds:.sodium chloride, acetaminophen, hydrALAZINE, HYDROmorphone (DILAUDID) injection, ipratropium-albuterol, [DISCONTINUED] ondansetron **OR** ondansetron (ZOFRAN) IV, sodium chloride flush    Objective: Weight change: 5 lb 1.1 oz (2.3 kg)  Intake/Output Summary (Last 24 hours) at 08/08/2017 1539 Last data filed at 08/08/2017 0615 Gross per 24 hour  Intake -  Output 1300 ml  Net -1300 ml   Blood pressure 102/74, pulse 86, temperature 98.4 F (36.9 C), temperature source Oral, resp. rate (!) 21, height 5\' 9"  (1.753 m), weight 231 lb (104.8 kg), SpO2 99 %. Temp:  [97.8 F (36.6 C)-98.5  F (36.9 C)] 98.4 F (36.9 C) (12/18 1200) Pulse Rate:  [78-89] 86 (12/18 1253) Resp:  [13-24] 21 (12/18 1253) BP: (102-131)/(63-75) 102/74 (12/18 1200) SpO2:  [96 %-100 %] 99 % (12/18 1253) Weight:  [231 lb (104.8 kg)-242 lb 8.1 oz (110 kg)] 231 lb (104.8 kg) (12/18 1610)  Physical Exam: General: Alert and awake, oriented x3, not in any acute distress. HEENT: anicteric sclera,  EOMI CVS regular rate, normal r,  Loud murmur at PMI radiating Chest: clear to auscultation bilaterally, no wheezing, rales or rhonchi Abdomen: soft nontender, nondistended, normal bowel sounds, Extremities: no  clubbing or edema noted bilaterally Skin: no rashes Neuro: nonfocal     CBC:  CBC Latest Ref Rng & Units 08/08/2017 08/07/2017 08/06/2017  WBC 4.0 - 10.5 K/uL 7.3 6.8 7.4    Hemoglobin 13.0 - 17.0 g/dL 9.6(E) 4.5(W) 0.9(W)  Hematocrit 39.0 - 52.0 % 27.0(L) 26.3(L) 28.2(L)  Platelets 150 - 400 K/uL 295 279 305      BMET Recent Labs    08/07/17 0502 08/08/17 0407  NA 132* 134*  K 3.7 3.4*  CL 99* 100*  CO2 26 27  GLUCOSE 175* 136*  BUN 20 25*  CREATININE 0.91 0.93  CALCIUM 8.4* 8.5*     Liver Panel  No results for input(s): PROT, ALBUMIN, AST, ALT, ALKPHOS, BILITOT, BILIDIR, IBILI in the last 72 hours.     Sedimentation Rate No results for input(s): ESRSEDRATE in the last 72 hours. C-Reactive Protein No results for input(s): CRP in the last 72 hours.  Micro Results: Recent Results (from the past 720 hour(s))  Culture, blood (x 2)     Status: None   Collection Time: 07/25/17  9:09 PM  Result Value Ref Range Status   Specimen Description BLOOD RIGHT HAND  Final   Special Requests   Final    BOTTLES DRAWN AEROBIC AND ANAEROBIC Blood Culture adequate volume   Culture   Final    NO GROWTH 5 DAYS Performed at [facility], [address] [place], [place] [address]    Report Status 07/31/2017 FINAL  Final  Culture, blood (x 2)     Status: Abnormal   Collection Time: 07/25/17  9:14 PM  Result Value Ref Range Status   Specimen Description BLOOD RIGHT ANTECUBITAL  Final   Special Requests IN PEDIATRIC BOTTLE Blood Culture adequate volume  Final   Culture  Setup Time   Final    GRAM POSITIVE COCCI IN PEDIATRIC BOTTLE CRITICAL RESULT CALLED TO, READ BACK BY AND VERIFIED WITH: JESSE GRIMSLEY PHARMD AT 2225 ON [id number] BY SJW Performed at [facility], [address], [place], [place] [address]    Culture METHICILLIN RESISTANT STAPHYLOCOCCUS AUREUS (A)  Final   Report Status 07/28/2017 FINAL  Final   Organism ID, Bacteria METHICILLIN RESISTANT STAPHYLOCOCCUS AUREUS  Final      Susceptibility   Methicillin resistant staphylococcus aureus - MIC*    CIPROFLOXACIN >=8 RESISTANT Resistant     ERYTHROMYCIN >=8 RESISTANT Resistant      GENTAMICIN <=0.5 SENSITIVE Sensitive     OXACILLIN >=4 RESISTANT Resistant     TETRACYCLINE <=1 SENSITIVE Sensitive     VANCOMYCIN <=0.5 SENSITIVE Sensitive     TRIMETH/SULFA <=10 SENSITIVE Sensitive     CLINDAMYCIN <=0.25 SENSITIVE Sensitive     RIFAMPIN <=0.5 SENSITIVE Sensitive     Inducible Clindamycin NEGATIVE Sensitive     * METHICILLIN RESISTANT STAPHYLOCOCCUS AUREUS  Blood Culture ID Panel (Reflexed)  Status: Abnormal   Collection Time: 07/25/17  9:14 PM  Result Value Ref Range Status   Enterococcus species NOT DETECTED NOT DETECTED Final   Listeria monocytogenes NOT DETECTED NOT DETECTED Final   Staphylococcus species DETECTED (A) NOT DETECTED Final    Comment: CRITICAL RESULT CALLED TO, READ BACK BY AND VERIFIED WITH: JESSEE GRIMSLEY PHARMD AT 2225 ON [id number] BY SJW    Staphylococcus aureus DETECTED (A) NOT DETECTED Final    Comment: Methicillin (oxacillin)-resistant Staphylococcus aureus (MRSA). MRSA is predictably resistant to beta-lactam antibiotics (except ceftaroline). Preferred therapy is vancomycin unless clinically contraindicated. Patient requires contact precautions if  hospitalized. CRITICAL RESULT CALLED TO, READ BACK BY AND VERIFIED WITH: JESSE GRIMSLEY PHARMD AT 2225 ON [id number] BY SJW    Methicillin resistance DETECTED (A) NOT DETECTED Final    Comment: CRITICAL RESULT CALLED TO, READ BACK BY AND VERIFIED WITH: JESSE GRIMSLEY PHARMD AT 2225 ON [id number] BY SJW    Streptococcus species NOT DETECTED NOT DETECTED Final   Streptococcus agalactiae NOT DETECTED NOT DETECTED Final   Streptococcus pneumoniae NOT DETECTED NOT DETECTED Final   Streptococcus pyogenes NOT DETECTED NOT DETECTED Final   Acinetobacter baumannii NOT DETECTED NOT DETECTED Final   Enterobacteriaceae species NOT DETECTED NOT DETECTED Final   Enterobacter cloacae complex NOT DETECTED NOT DETECTED Final   Escherichia coli NOT DETECTED NOT DETECTED Final   Klebsiella oxytoca NOT DETECTED NOT  DETECTED Final   Klebsiella pneumoniae NOT DETECTED NOT DETECTED Final   Proteus species NOT DETECTED NOT DETECTED Final   Serratia marcescens NOT DETECTED NOT DETECTED Final   Haemophilus influenzae NOT DETECTED NOT DETECTED Final   Neisseria meningitidis NOT DETECTED NOT DETECTED Final   Pseudomonas aeruginosa NOT DETECTED NOT DETECTED Final   Candida albicans NOT DETECTED NOT DETECTED Final   Candida glabrata NOT DETECTED NOT DETECTED Final   Candida krusei NOT DETECTED NOT DETECTED Final   Candida parapsilosis NOT DETECTED NOT DETECTED Final   Candida tropicalis NOT DETECTED NOT DETECTED Final    Comment: Performed at [facility], [address] [place], [place] [address]  MRSA PCR Screening     Status: Abnormal   Collection Time: 07/27/17  6:20 PM  Result Value Ref Range Status   MRSA by PCR POSITIVE (A) NEGATIVE Final    Comment:        The GeneXpert MRSA Assay (FDA approved for NASAL specimens only), is one component of a comprehensive MRSA colonization surveillance program. It is not intended to diagnose MRSA infection nor to guide or monitor treatment for MRSA infections. RESULT CALLED TO, READ BACK BY AND VERIFIED WITH: WOODY,J RN 12.6.18 @2014  ZANDO,C   Culture, blood (routine x 2)     Status: Abnormal   Collection Time: 07/28/17  7:44 AM  Result Value Ref Range Status   Specimen Description BLOOD LEFT HAND  Final   Special Requests IN PEDIATRIC BOTTLE Blood Culture adequate volume  Final   Culture  Setup Time   Final    GRAM POSITIVE COCCI IN CLUSTERS IN PEDIATRIC BOTTLE CRITICAL VALUE NOTED.  VALUE IS CONSISTENT WITH PREVIOUSLY REPORTED AND CALLED VALUE.    Culture (A)  Final    STAPHYLOCOCCUS AUREUS SUSCEPTIBILITIES PERFORMED ON PREVIOUS CULTURE WITHIN THE LAST 5 DAYS. Performed at [facility], [address] [place], [place] [address]    Report Status 07/30/2017 FINAL  Final  Culture, blood (routine x 2)     Status: Abnormal   Collection  Time: 07/28/17  7:45 AM  Result Value Ref Range Status   Specimen Description BLOOD RIGHT HAND  Final   Special Requests IN PEDIATRIC BOTTLE Blood Culture adequate volume  Final   Culture  Setup Time   Final    GRAM POSITIVE COCCI IN CLUSTERS IN PEDIATRIC BOTTLE CRITICAL VALUE NOTED.  VALUE IS CONSISTENT WITH PREVIOUSLY REPORTED AND CALLED VALUE.    Culture (A)  Final    STAPHYLOCOCCUS AUREUS SUSCEPTIBILITIES PERFORMED ON PREVIOUS CULTURE WITHIN THE LAST 5 DAYS. Performed at [facility], [address] [place], [place] [address]    Report Status 07/30/2017 FINAL  Final  Culture, blood (Routine X 2) w Reflex to ID Panel     Status: Abnormal   Collection Time: 07/31/17  9:37 AM  Result Value Ref Range Status   Specimen Description BLOOD RIGHT FOREARM  Final   Special Requests   Final    BOTTLES DRAWN AEROBIC AND ANAEROBIC Blood Culture adequate volume   Culture  Setup Time   Final    GRAM POSITIVE COCCI IN CLUSTERS ANAEROBIC BOTTLE ONLY CRITICAL VALUE NOTED.  VALUE IS CONSISTENT WITH PREVIOUSLY REPORTED AND CALLED VALUE. Performed at [facility], [address], [place], [place] [address]    Culture METHICILLIN RESISTANT STAPHYLOCOCCUS AUREUS (A)  Final   Report Status 08/04/2017 FINAL  Final   Organism ID, Bacteria METHICILLIN RESISTANT STAPHYLOCOCCUS AUREUS  Final      Susceptibility   Methicillin resistant staphylococcus aureus - MIC*    CIPROFLOXACIN >=8 RESISTANT Resistant     ERYTHROMYCIN >=8 RESISTANT Resistant     GENTAMICIN <=0.5 SENSITIVE Sensitive     OXACILLIN >=4 RESISTANT Resistant     TETRACYCLINE <=1 SENSITIVE Sensitive     VANCOMYCIN 1 SENSITIVE Sensitive     TRIMETH/SULFA <=10 SENSITIVE Sensitive     CLINDAMYCIN <=0.25 SENSITIVE Sensitive     RIFAMPIN <=0.5 SENSITIVE Sensitive     Inducible Clindamycin NEGATIVE Sensitive     * METHICILLIN RESISTANT STAPHYLOCOCCUS AUREUS  Culture, blood (Routine X 2) w Reflex to ID Panel     Status: Abnormal    Collection Time: 07/31/17  9:37 AM  Result Value Ref Range Status   Specimen Description RIGHT ANTECUBITAL  Final   Special Requests IN PEDIATRIC BOTTLE Blood Culture adequate volume  Final   Culture  Setup Time   Final    GRAM POSITIVE COCCI IN CLUSTERS IN PEDIATRIC BOTTLE CRITICAL VALUE NOTED.  VALUE IS CONSISTENT WITH PREVIOUSLY REPORTED AND CALLED VALUE.    Culture (A)  Final    STAPHYLOCOCCUS AUREUS SUSCEPTIBILITIES PERFORMED ON PREVIOUS CULTURE WITHIN THE LAST 5 DAYS. Performed at [facility], [address] [place], [place] [address]    Report Status 08/04/2017 FINAL  Final  Culture, blood (Routine X 2) w Reflex to ID Panel     Status: None   Collection Time: 08/03/17 12:32 PM  Result Value Ref Range Status   Specimen Description BLOOD LEFT HAND  Final   Special Requests   Final    BOTTLES DRAWN AEROBIC AND ANAEROBIC Blood Culture adequate volume   Culture NO GROWTH 5 DAYS  Final   Report Status 08/08/2017 FINAL  Final  Culture, blood (Routine X 2) w Reflex to ID Panel     Status: None   Collection Time: 08/03/17 12:40 PM  Result Value Ref Range Status   Specimen Description BLOOD LEFT HAND  Final   Special Requests   Final    BOTTLES DRAWN  AEROBIC AND ANAEROBIC Blood Culture results may not be optimal due to an inadequate volume of blood received in culture bottles   Culture NO GROWTH 5 DAYS  Final   Report Status 08/08/2017 FINAL  Final    Studies/Results: Dg Chest 2 View  Result Date: 08/08/2017 CLINICAL DATA:  Shortness of breath. EXAM: CHEST  2 VIEW COMPARISON:  07/31/2017 FINDINGS: Motion degraded study. AP and lateral views were obtained. Endotracheal and NG tube seen on the prior study are no longer evident. Asymmetric elevation right hemidiaphragm noted. There is bibasilar collapse/ consolidation with possible tiny bilateral pleural effusions. The cardio pericardial silhouette is enlarged. The visualized bony structures of the thorax are intact. Telemetry  leads overlie the chest. IMPRESSION: 1. Cardiomegaly with vascular congestion. 2. Bibasilar collapse/consolidation with small effusion. Electronically Signed   By: Kennith Center M.D.   On: 08/08/2017 09:46   Mr Cervical Spine W Wo Contrast  Addendum Date: 08/08/2017   ADDENDUM REPORT: 08/08/2017 15:17 ADDENDUM: These results were called by telephone at the time of interpretation on 08/08/2017 at 3:15 pm to the patient's nurse, Tressia Miners,, who verbally acknowledged these results. Electronically Signed   By: Drusilla Kanner M.D.   On: 08/08/2017 15:17   Result Date: 08/08/2017 CLINICAL DATA:  Neck pain and patient with bacterial endocarditis. Question infection. EXAM: MRI CERVICAL SPINE WITHOUT AND WITH CONTRAST TECHNIQUE: Multiplanar and multiecho pulse sequences of the cervical spine, to include the craniocervical junction and cervicothoracic junction, were obtained without and with intravenous contrast. CONTRAST:  20 ml MULTIHANCE GADOBENATE DIMEGLUMINE 529 MG/ML IV SOLN COMPARISON:  CT angiogram of the neck 07/28/2017. FINDINGS: Alignment: There is trace retrolisthesis of C4 on C5. Alignment is otherwise maintained. Vertebrae: Abnormal edema and enhancement are seen in the C4-5 endplates consistent with discitis and osteomyelitis. No epidural abscess is identified. The patient also has abnormal edema and enhancement in the odontoid process and in the anterior arch of C1. Marrow edema and enhancement are also identified at the articulation of the occipital condyles and lateral masses of C1 which appears worse on the right where there is an associated small effusion highly suspicious for septic joint. Cord: Normal signal throughout. Posterior Fossa, vertebral arteries, paraspinal tissues: There is a small fluid collection along the right lateral mass of C2 measuring approximately 1.8 cm AP by 0.3 cm transverse by 0.9 cm craniocaudal worrisome for abscess. Edema and enhancement are seen and posterior  paraspinous musculature, particularly the semispinalis muscle on the right where there is an abscess measuring 1.2 cm AP by 0.3 cm transverse at the level of the C2-3 disc interspace. This abscess likely communicates with the abscess described above. Although no epidural abscess is identified, there is epidural enhancement posterior to C2 and at C4-5 and extensive prevertebral soft tissue edema and enhancement. Disc levels: Patient motion somewhat limits evaluation of the disc interspaces. C1-2:  The central canal is open. C2-3:  Mild disc bulge without stenosis. C3-4:  Minimal disc bulge without stenosis. C4-5: Shallow disc bulge effaces the ventral thecal sac. Uncovertebral disease causes mild to moderate foraminal narrowing, worse on the left. C5-6: There is a shallow disc bulge, uncovertebral disease and facet arthropathy. The ventral thecal sac appears effaced. Severe left and moderate right foraminal narrowing is identified. C6-7: Shallow disc bulge without central canal stenosis. Mild to moderate left foraminal narrowing is seen. The right foramen is open. C7-T1:  Negative. IMPRESSION: Findings consistent with osteomyelitis in the odontoid process of C2 and anterior arch of C1.  Negative for epidural abscess. Marrow edema and enhancement about the articulation of the occipital condyles and lateral masses of C1, worse on the right, consistent osteomyelitis and septic joint. As described above, there is a small abscess along the right lateral mass of C2 in the paraspinous soft tissues. Findings consistent with discitis and osteomyelitis at C4-5. Negative for epidural abscess. Edema and enhancement in paraspinous musculature consistent with myositis. Changes are worse on the right where there is a small abscess in semispinalis muscle at the level of the C2-3 disc interspace. This abscess likely communicates with the abscess along the right lateral mass of C2 described above. Electronically Signed: By: Drusilla Kanner M.D. On: 08/08/2017 15:11      Assessment/Plan:  INTERVAL HISTORY: TEE yesterday showed:  Large mass of the A3 segment of the anterior mitral leaflet, prolapsing over the posterior leaflet. Posteriorly located central regurgitant jet suggestive of possible perforation - in the region of the P3 segment. Endocarditis of the posterior leaflet tip is noted. There is a possible small mass on the non-coronary cusp of the aortic valve with trivial AI - this could be resolved endocarditis or sclerosis as well.  Pt spextraction of tooth numbers [id number] and 32 2. 4 Quadrants of alveoloplasty by Dr. Melvern Banker  Principal Problem:   Endocarditis of mitral valve Active Problems:   MRSA bacteremia   Mitral valve vegetation   Acute respiratory failure (HCC)   Alcohol abuse   Thrombocytopenia (HCC)   Abnormal liver enzymes   Acute encephalopathy   Acute diastolic CHF (congestive heart failure) (HCC)   Sepsis (HCC)   Cerebral septic emboli (HCC)   Hypertensive heart disease with heart failure (HCC)   Endotracheal tube present   Acute respiratory disease    Carvell Dunnavant is a 56 y.o. male with MRSA bacteremia and sepsis and MV endocarditis with peforation and severe MR, possible AV endocarditis, Pneumonia and difficulty clearing blood cultures  #1 MRSA bacteremia with mitral valve and possible aortic valve endocarditis, pneumonia:  I suspect reason that patient was previously with persistently positive blood cultures was because of the massive amount of infection on his mitral valve where he has a very large vegetation.  Persistently positive blood cultures are in known reason for need for valve replacement and I think the likely explanation why he was persistently bacteremic despite being on supratherapeutic levels of vancomycin and the organism showing no evidence of resistance to vancomycin.  That being said we made a move to place him on daptomycin and  Teflaro in that setting (another reason for Teflaro was to cover for pneumoina), Rifampin was later added.  Blood cultures now no growth x 5 days and final  I will DC the rifampin since he not have prosthetic material in place that would require this.  For the time being I would prefer to err on being aggressive and continue with both daptomycin and teflaro  He is to have valve replaced on Monday  I would send valve tissue for culture from OR  I would plan on giving him 6 weeks of postoperative IV antibiotics  This could be with daptomycin alone potentially  #2 PNA; he has had more than 7 days but will continue with teflaro for now     LOS: 14 days   Acey Lav 08/08/2017, 3:39 PM

## 2017-08-08 NOTE — Progress Notes (Signed)
PROGRESS NOTE    Reginald [name]  ZOX:[id number] DOB: 05/02/1961 DOA: 07/25/2017 PCP: Patient, No Pcp Per      Brief Narrative:  Unfortunate 56 yo M with alcohol use d/o, HFpEF, COPD, DM who [facility] with [facility], found to have MRSA bacteremia. Bacteremia complicated by septic emboli, endocarditis (2cm MV veg), pneumonia, and now vertebral osteomyelitis.   Assessment & Plan:  Principal Problem:   Aortic valve endocarditis Active Problems:   MRSA bacteremia   Mitral valve vegetation   Acute respiratory failure (HCC)   Alcohol abuse   Thrombocytopenia (HCC)   Abnormal liver enzymes   Acute encephalopathy   Acute diastolic CHF (congestive heart failure) (HCC)   Sepsis (HCC)   Cerebral septic emboli (HCC)   Hypertensive heart disease with heart failure (HCC)   Endotracheal tube present   Acute respiratory disease     MRSA bacteremia  MV endocarditis Possible AV endocarditis CNS, septic emboli to brain Possible multifocal pneumonia on CT C1 vertebral osteo, C4 discitis, paraspinal abscess -Continue daptomycin, ceftaroline -Consult to ID, appreciate cares -Consult to CVTS, appreciate cares -Consult to Cardiology, appreicate Cares     C1 vertebral osteomyelitis/C1-occiput septic joint/paraspinal abscesses ~2cm x2/C4-5 discitis Patient neurologically intact.  No epidural abscess or spinal cord impingement on imaging.  Discussed with Dr. Wynetta Emeryram, medical mgmt alone.  Repeat MRI C-spine in 4-6 weeks. -Obtain MRI T and L spine  S/p dental extraction from Chronic apical periodontitis/Retained root segments/Dental caries/Loose teeth on 12/17  Encephalopathy Alcohol delirium Resolved.  Multifactorial from septic emboli, alcohol withdrawal (requiring Precedex and intubation twice, once at [facility] and once here)  Chronic normocytic anemia Stable   Hypertension Acute on chronic diastolic CHF Severe mitral regurgitation -Continue metoprolol -Continue  furosemide and K -Daily weights -Daily BMP -Consult to CVTS       DVT prophylaxis: SCDs Code Status: FULL Family Communication: None present Disposition Plan: Tentatively planning MV replacement on Monday.    Consultants:   CVTS  ID  Cardiology   Antimicrobials:   Ceftaroline  Daptomycin    Subjective: Pain in the neck is severe.  No new feve,r chils, consufion.  No nausea, vomiting, cough.  No new rash.  No paresthesias.  Objective: Vitals:   08/08/17 1200 08/08/17 1253 08/08/17 1600 08/08/17 1745  BP: 102/74  124/78   Pulse: 87 86 80 84  Resp: 20 (!) 21 (!) 23 15  Temp: 98.4 F (36.9 C)  98.3 F (36.8 C)   TempSrc: Oral  Oral   SpO2: 98% 99% 99% 97%  Weight:      Height:        Intake/Output Summary (Last 24 hours) at 08/08/2017 1935 Last data filed at 08/08/2017 1200 Gross per 24 hour  Intake 480 ml  Output 800 ml  Net -320 ml   Filed Weights   08/07/17 0121 08/08/17 0442 08/08/17 0621  Weight: 107.7 kg (237 lb 7 oz) 110 kg (242 lb 8.1 oz) 104.8 kg (231 lb)    Examination: General appearance: Obese il-appearing adult male, alert and in no acute distress.   HEENT: Anicteric, conjunctiva pink, lids and lashes normal. No nasal deformity, discharge, epistaxis.  Lips moist.  Neck very stiff.  Extreme pain with even slight lateral mvt, lifting chin.  Mild tenderness to palpation in right neck, no masses appreicated.     Cardiac: RRR, nl S1-S2, systolic murmr.  Capillary refill is brisk.  JVP normal.  1+ LE edema.  Radial pulses 2+ and symmetric. Respiratory: Normal  respiratory rate and rhythm.  CTAB without rales or wheezes. Abdomen: Abdomen soft.  No TTP. No ascites, distension, hepatosplenomegaly.   MSK: No deformities or effusions. Neuro: Awake and alert.  EOMI, moves all extremities. Speech fluent.    Psych: Sensorium intact and responding to questions, attention normal. Affect nrmal.  Judgment and insight appear normal.    Data Reviewed: I  have personally reviewed following labs and imaging studies:  CBC: Recent Labs  Lab 08/02/17 0710  08/05/17 0850 08/05/17 2351 08/06/17 0240 08/07/17 0502 08/08/17 0407  WBC 11.2*   < > 7.1 7.3 7.4 6.8 7.3  NEUTROABS 9.9*  --   --   --   --   --   --   HGB 11.1*   < > 9.5* 9.2* 9.0* 8.5* 8.6*  HCT 35.3*   < > 28.9* 28.7* 28.2* 26.3* 27.0*  MCV 98.9   < > 96.3 95.3 94.9 93.3 94.4  PLT 351   < > 313 309 305 279 295   < > = values in this interval not displayed.   Basic Metabolic Panel: Recent Labs  Lab 08/03/17 0304 08/04/17 0231 08/05/17 2351 08/07/17 0502 08/08/17 0407  NA 139 135 131* 132* 134*  K 3.6 3.4* 3.9 3.7 3.4*  CL 106 103 98* 99* 100*  CO2 28 25 25 26 27   GLUCOSE 133* 120* 111* 175* 136*  BUN 31* 27* 25* 20 25*  CREATININE 0.84 0.85 0.91 0.91 0.93  CALCIUM 8.6* 8.7* 8.5* 8.4* 8.5*   GFR: Estimated Creatinine Clearance: 104.5 mL/min (by C-G formula based on SCr of 0.93 mg/dL). Liver Function Tests: Recent Labs  Lab 08/03/17 0304  AST 34  ALT 28  ALKPHOS 147*  BILITOT 2.3*  PROT 7.8  ALBUMIN 1.9*   No results for input(s): LIPASE, AMYLASE in the last 168 hours. Recent Labs  Lab 08/02/17 1049  AMMONIA 101*   Coagulation Profile: Recent Labs  Lab 08/02/17 0242  INR 1.25   Cardiac Enzymes: Recent Labs  Lab 08/07/17 0502  CKTOTAL 19*   BNP (last 3 results) No results for input(s): PROBNP in the last 8760 hours. HbA1C: No results for input(s): HGBA1C in the last 72 hours. CBG: Recent Labs  Lab 08/07/17 1718 08/07/17 2232 08/08/17 0623 08/08/17 1226 08/08/17 1655  GLUCAP 173* 199* 152* 256* 139*   Lipid Profile: Recent Labs    08/08/17 0407  TRIG 290*   Thyroid Function Tests: No results for input(s): TSH, T4TOTAL, FREET4, T3FREE, THYROIDAB in the last 72 hours. Anemia Panel: No results for input(s): VITAMINB12, FOLATE, FERRITIN, TIBC, IRON, RETICCTPCT in the last 72 hours. Urine analysis:    Component Value Date/Time    COLORURINE STRAW (A) 07/28/2017 1330   APPEARANCEUR CLEAR 07/28/2017 1330   LABSPEC 1.010 07/28/2017 1330   PHURINE 5.0 07/28/2017 1330   GLUCOSEU NEGATIVE 07/28/2017 1330   HGBUR NEGATIVE 07/28/2017 1330   BILIRUBINUR NEGATIVE 07/28/2017 1330   KETONESUR NEGATIVE 07/28/2017 1330   PROTEINUR NEGATIVE 07/28/2017 1330   NITRITE NEGATIVE 07/28/2017 1330   LEUKOCYTESUR NEGATIVE 07/28/2017 1330   Sepsis Labs: @LABRCNTIP (procalcitonin:4,lacticidven:4)  ) Recent Results (from the past 240 hour(s))  Culture, blood (Routine X 2) w Reflex to ID Panel     Status: Abnormal   Collection Time: 07/31/17  9:37 AM  Result Value Ref Range Status   Specimen Description BLOOD RIGHT FOREARM  Final   Special Requests   Final    BOTTLES DRAWN AEROBIC AND ANAEROBIC Blood Culture adequate volume  Culture  Setup Time   Final    GRAM POSITIVE COCCI IN CLUSTERS ANAEROBIC BOTTLE ONLY CRITICAL VALUE NOTED.  VALUE IS CONSISTENT WITH PREVIOUSLY REPORTED AND CALLED VALUE. Performed at [facility], [address] [place], [place] [address]    Culture METHICILLIN RESISTANT STAPHYLOCOCCUS AUREUS (A)  Final   Report Status 08/04/2017 FINAL  Final   Organism ID, Bacteria METHICILLIN RESISTANT STAPHYLOCOCCUS AUREUS  Final      Susceptibility   Methicillin resistant staphylococcus aureus - MIC*    CIPROFLOXACIN >=8 RESISTANT Resistant     ERYTHROMYCIN >=8 RESISTANT Resistant     GENTAMICIN <=0.5 SENSITIVE Sensitive     OXACILLIN >=4 RESISTANT Resistant     TETRACYCLINE <=1 SENSITIVE Sensitive     VANCOMYCIN 1 SENSITIVE Sensitive     TRIMETH/SULFA <=10 SENSITIVE Sensitive     CLINDAMYCIN <=0.25 SENSITIVE Sensitive     RIFAMPIN <=0.5 SENSITIVE Sensitive     Inducible Clindamycin NEGATIVE Sensitive     * METHICILLIN RESISTANT STAPHYLOCOCCUS AUREUS  Culture, blood (Routine X 2) w Reflex to ID Panel     Status: Abnormal   Collection Time: 07/31/17  9:37 AM  Result Value Ref Range Status   Specimen  Description RIGHT ANTECUBITAL  Final   Special Requests IN PEDIATRIC BOTTLE Blood Culture adequate volume  Final   Culture  Setup Time   Final    GRAM POSITIVE COCCI IN CLUSTERS IN PEDIATRIC BOTTLE CRITICAL VALUE NOTED.  VALUE IS CONSISTENT WITH PREVIOUSLY REPORTED AND CALLED VALUE.    Culture (A)  Final    STAPHYLOCOCCUS AUREUS SUSCEPTIBILITIES PERFORMED ON PREVIOUS CULTURE WITHIN THE LAST 5 DAYS. Performed at [facility], [address], [place], [place] [address]    Report Status 08/04/2017 FINAL  Final  Culture, blood (Routine X 2) w Reflex to ID Panel     Status: None   Collection Time: 08/03/17 12:32 PM  Result Value Ref Range Status   Specimen Description BLOOD LEFT HAND  Final   Special Requests   Final    BOTTLES DRAWN AEROBIC AND ANAEROBIC Blood Culture adequate volume   Culture NO GROWTH 5 DAYS  Final   Report Status 08/08/2017 FINAL  Final  Culture, blood (Routine X 2) w Reflex to ID Panel     Status: None   Collection Time: 08/03/17 12:40 PM  Result Value Ref Range Status   Specimen Description BLOOD LEFT HAND  Final   Special Requests   Final    BOTTLES DRAWN AEROBIC AND ANAEROBIC Blood Culture results may not be optimal due to an inadequate volume of blood received in culture bottles   Culture NO GROWTH 5 DAYS  Final   Report Status 08/08/2017 FINAL  Final         Radiology Studies: Dg Chest 2 View  Result Date: 08/08/2017 CLINICAL DATA:  Shortness of breath. EXAM: CHEST  2 VIEW COMPARISON:  07/31/2017 FINDINGS: Motion degraded study. AP and lateral views were obtained. Endotracheal and NG tube seen on the prior study are no longer evident. Asymmetric elevation right hemidiaphragm noted. There is bibasilar collapse/ consolidation with possible tiny bilateral pleural effusions. The cardio pericardial silhouette is enlarged. The visualized bony structures of the thorax are intact. Telemetry leads overlie the chest. IMPRESSION: 1. Cardiomegaly with vascular  congestion. 2. Bibasilar collapse/consolidation with small effusion. Electronically Signed   By: Kennith Center M.D.   On: 08/08/2017 09:46   Mr Cervical Spine W Wo Contrast  Addendum Date: 08/08/2017  ADDENDUM REPORT: 08/08/2017 15:17 ADDENDUM: These results were called by telephone at the time of interpretation on 08/08/2017 at 3:15 pm to the patient's nurse, Tressia Miners,, who verbally acknowledged these results. Electronically Signed   By: Drusilla Kanner M.D.   On: 08/08/2017 15:17   Result Date: 08/08/2017 CLINICAL DATA:  Neck pain and patient with bacterial endocarditis. Question infection. EXAM: MRI CERVICAL SPINE WITHOUT AND WITH CONTRAST TECHNIQUE: Multiplanar and multiecho pulse sequences of the cervical spine, to include the craniocervical junction and cervicothoracic junction, were obtained without and with intravenous contrast. CONTRAST:  20 ml MULTIHANCE GADOBENATE DIMEGLUMINE 529 MG/ML IV SOLN COMPARISON:  CT angiogram of the neck 07/28/2017. FINDINGS: Alignment: There is trace retrolisthesis of C4 on C5. Alignment is otherwise maintained. Vertebrae: Abnormal edema and enhancement are seen in the C4-5 endplates consistent with discitis and osteomyelitis. No epidural abscess is identified. The patient also has abnormal edema and enhancement in the odontoid process and in the anterior arch of C1. Marrow edema and enhancement are also identified at the articulation of the occipital condyles and lateral masses of C1 which appears worse on the right where there is an associated small effusion highly suspicious for septic joint. Cord: Normal signal throughout. Posterior Fossa, vertebral arteries, paraspinal tissues: There is a small fluid collection along the right lateral mass of C2 measuring approximately 1.8 cm AP by 0.3 cm transverse by 0.9 cm craniocaudal worrisome for abscess. Edema and enhancement are seen and posterior paraspinous musculature, particularly the semispinalis muscle on the right  where there is an abscess measuring 1.2 cm AP by 0.3 cm transverse at the level of the C2-3 disc interspace. This abscess likely communicates with the abscess described above. Although no epidural abscess is identified, there is epidural enhancement posterior to C2 and at C4-5 and extensive prevertebral soft tissue edema and enhancement. Disc levels: Patient motion somewhat limits evaluation of the disc interspaces. C1-2:  The central canal is open. C2-3:  Mild disc bulge without stenosis. C3-4:  Minimal disc bulge without stenosis. C4-5: Shallow disc bulge effaces the ventral thecal sac. Uncovertebral disease causes mild to moderate foraminal narrowing, worse on the left. C5-6: There is a shallow disc bulge, uncovertebral disease and facet arthropathy. The ventral thecal sac appears effaced. Severe left and moderate right foraminal narrowing is identified. C6-7: Shallow disc bulge without central canal stenosis. Mild to moderate left foraminal narrowing is seen. The right foramen is open. C7-T1:  Negative. IMPRESSION: Findings consistent with osteomyelitis in the odontoid process of C2 and anterior arch of C1. Negative for epidural abscess. Marrow edema and enhancement about the articulation of the occipital condyles and lateral masses of C1, worse on the right, consistent osteomyelitis and septic joint. As described above, there is a small abscess along the right lateral mass of C2 in the paraspinous soft tissues. Findings consistent with discitis and osteomyelitis at C4-5. Negative for epidural abscess. Edema and enhancement in paraspinous musculature consistent with myositis. Changes are worse on the right where there is a small abscess in semispinalis muscle at the level of the C2-3 disc interspace. This abscess likely communicates with the abscess along the right lateral mass of C2 described above. Electronically Signed: By: Drusilla Kanner M.D. On: 08/08/2017 15:11        Scheduled Meds: .  chlorhexidine  15 mL Mouth Rinse BID  . feeding supplement (ENSURE ENLIVE)  237 mL Oral BID BM  . folic acid  1 mg Intravenous Daily  . furosemide  40 mg  Intravenous Q8H  . insulin aspart  0-20 Units Subcutaneous TID AC & HS  . lidocaine  1 patch Transdermal Q24H  . metoprolol tartrate  25 mg Oral BID  . potassium chloride  20 mEq Oral BID  . sodium chloride flush  3 mL Intravenous Q12H  . thiamine  100 mg Intravenous Daily   Or  . thiamine  100 mg Oral Daily   Continuous Infusions: . sodium chloride    . ceFTAROline (TEFLARO) IV Stopped (08/08/17 1255)  . DAPTOmycin (CUBICIN)  IV Stopped (08/08/17 1600)  . lactated ringers       LOS: 14 days    Time spent: 60 minutes    Alberteen Samhristopher P Yusef Lamp, MD [facility] Pager [phone]  If 7PM-7AM, please contact night-coverage [email or web address] Password [email or web address] 08/08/2017, 7:35 PM

## 2017-08-08 NOTE — Progress Notes (Signed)
Nutrition Follow-up  DOCUMENTATION CODES:   Obesity unspecified  INTERVENTION:   Ensure Enlive po BID, each supplement provides 350 kcal and 20 grams of protein  Recommend Dysphagia III (Easy to chew) diet; if difficulty chewing this consistency, recommend downgrading diet further   NUTRITION DIAGNOSIS:   Increased nutrient needs related to chronic illness(COPD, CHF, alcoholism ) as evidenced by estimated needs.  Continues but being addressed via supplement  GOAL:   Patient will meet greater than or equal to 90% of their needs  Progressing  MONITOR:   Diet advancement, PO intake, Supplement acceptance, Weight trends, Labs  REASON FOR ASSESSMENT:   Consult Other (Comment)(teeth extraction)  ASSESSMENT:   56 year old male with past medical history significant for COPD, DM, alcohol abuse, diastolic CHF, and AAA who was [facility] with nonspecific complaints of generalized weakness, low-grade fever, and muscle aches. 2D echocardiogram at that time demonstrated a mitral valve vegetation. He required ICU transfer [facility] for Precedex infusion with presumed alcohol withdrawal, was weaned off on 12/3.  [facility] diseases consultation.  On 12/4 he was found to have a new facial droop.  MRI was done and demonstrated multiple infarcts to both cerebral hemispheres consistent with septic emboli.  12/5 late p.m. he developed agitation which is refractory to repeated doses of Ativan. He was started on Precedex infusion.    12/17 Dental extractions of necrotic teeth  Plan for MV replacement on 12/24  Pt tolerating CL diet without difficulty, reports appetite is improving. Pt   Labs: sodium 134, potassium 3.4 Meds: lasix, KCl, thiamine, folic acid  Diet Order: Clear Liquid, advance as tolerated  EDUCATION NEEDS:   No education needs have been identified at this  time  Skin:  Skin Assessment: Reviewed RN Assessment  Last BM:  12/16  Height:   Ht Readings from Last 1 Encounters:  08/01/17 5\' 9"  (1.753 m)    Weight:   Wt Readings from Last 1 Encounters:  08/08/17 231 lb (104.8 kg)    Ideal Body Weight:  72.73 kg  BMI:  Body mass index is 34.11 kg/m.  Estimated Nutritional Needs:   Kcal:  2100-2300  Protein:  120-135 gm  Fluid:  2.1-2.3 L   [employer] Corporation[employer]ate Aribelle Mccosh MS, RD, LDN, CNSC [phone] Pager  [phone] Weekend/On-Call Pager

## 2017-08-09 DIAGNOSIS — I358 Other nonrheumatic aortic valve disorders: Secondary | ICD-10-CM

## 2017-08-09 DIAGNOSIS — K089 Disorder of teeth and supporting structures, unspecified: Secondary | ICD-10-CM

## 2017-08-09 DIAGNOSIS — K029 Dental caries, unspecified: Secondary | ICD-10-CM

## 2017-08-09 DIAGNOSIS — R748 Abnormal levels of other serum enzymes: Secondary | ICD-10-CM

## 2017-08-09 DIAGNOSIS — K0530 Chronic periodontitis, unspecified: Secondary | ICD-10-CM

## 2017-08-09 LAB — BASIC METABOLIC PANEL
Anion gap: 9 (ref 5–15)
BUN: 16 mg/dL (ref 6–20)
CHLORIDE: 95 mmol/L — AB (ref 101–111)
CO2: 28 mmol/L (ref 22–32)
CREATININE: 0.84 mg/dL (ref 0.61–1.24)
Calcium: 8.6 mg/dL — ABNORMAL LOW (ref 8.9–10.3)
GFR calc Af Amer: >60 mL/min (ref >60)
GFR calc non Af Amer: >60 mL/min (ref >60)
GLUCOSE: 169 mg/dL — AB (ref 65–99)
POTASSIUM: 4.2 mmol/L (ref 3.5–5.1)
Sodium: 132 mmol/L — ABNORMAL LOW (ref 135–145)

## 2017-08-09 LAB — CBC
HEMATOCRIT: 26.4 % — AB (ref 39.0–52.0)
Hemoglobin: 8.6 g/dL — ABNORMAL LOW (ref 13.0–17.0)
MCH: 30.3 pg (ref 26.0–34.0)
MCHC: 32.6 g/dL (ref 30.0–36.0)
MCV: 93 fL (ref 78.0–100.0)
PLATELETS: 294 10*3/uL (ref 150–400)
RBC: 2.84 MIL/uL — ABNORMAL LOW (ref 4.22–5.81)
RDW: 13.8 % (ref 11.5–15.5)
WBC: 6.5 10*3/uL (ref 4.0–10.5)

## 2017-08-09 LAB — GLUCOSE, CAPILLARY
GLUCOSE-CAPILLARY: 286 mg/dL — AB (ref 65–99)
Glucose-Capillary: 237 mg/dL — ABNORMAL HIGH (ref 65–99)
Glucose-Capillary: 197 mg/dL — ABNORMAL HIGH (ref 65–99)
Glucose-Capillary: 153 mg/dL — ABNORMAL HIGH (ref 65–99)
Glucose-Capillary: 185 mg/dL — ABNORMAL HIGH (ref 65–99)

## 2017-08-09 MED ORDER — ACETAMINOPHEN 325 MG PO TABS
650.0000 mg | ORAL_TABLET | Freq: Four times a day (QID) | ORAL | Status: DC | PRN
  Administered 2017-08-09 – 2017-08-10 (×3): 650 mg via ORAL
  Filled 2017-08-09 (×3): qty 2

## 2017-08-09 MED ORDER — INSULIN ASPART 100 UNIT/ML ~~LOC~~ SOLN
0.0000 [IU] | Freq: Three times a day (TID) | SUBCUTANEOUS | Status: DC
  Administered 2017-08-09: 8 [IU] via SUBCUTANEOUS
  Administered 2017-08-09: 3 [IU] via SUBCUTANEOUS
  Administered 2017-08-10: 5 [IU] via SUBCUTANEOUS
  Administered 2017-08-10: 3 [IU] via SUBCUTANEOUS

## 2017-08-09 MED ORDER — INSULIN GLARGINE 100 UNIT/ML ~~LOC~~ SOLN
7.0000 [IU] | Freq: Every day | SUBCUTANEOUS | Status: DC
  Administered 2017-08-09 – 2017-08-10 (×2): 7 [IU] via SUBCUTANEOUS
  Filled 2017-08-09 (×3): qty 0.07

## 2017-08-09 MED ORDER — FOLIC ACID 1 MG PO TABS
1.0000 mg | ORAL_TABLET | Freq: Every day | ORAL | Status: DC
  Administered 2017-08-10: 1 mg via ORAL
  Filled 2017-08-09: qty 1

## 2017-08-09 MED ORDER — INSULIN ASPART 100 UNIT/ML ~~LOC~~ SOLN: 0.0000 [IU] | Freq: Every day | SUBCUTANEOUS | Status: DC

## 2017-08-09 NOTE — Progress Notes (Signed)
Physical Therapy Treatment Patient Details Name: Reginald [name] MRN: [id number] DOB: 06-Oct-1960 Today's Date: 08/09/2017    History of Present Illness Pt adm to [facility] on 12/04 from [facility] with MRSA endocarditis of mitral valve with moderate-severe MR. Pt developed agitation on 12/5 thought to be ETOH withdrawal. MRI on 12/5 done and showed multiple areas bilaterally of septic emboli. Pt with severe encephalopathy and respiratory failure and was intubated on 12/6. Pt extubated on 12/10. PMH - DM, ETOH, obesity    PT Comments    Patient received in bed with fiancee present, very pleasant and willing to participate in skilled PT services. Began session with LE exercises, then proceeded with functional mobility and transfers with patient in general requiring Min assist. Able to ambulate to sink and stand statically for approximately 3-5 minutes before ambulating to doorway with chair follow, patient quite fatigued at this point and requested to stop. Patient left up in chair with phone and call bell within reach, all needs met and fiancee present.    Follow Up Recommendations  CIR     Equipment Recommendations  Rolling walker with 5" wheels    Recommendations for Other Services OT consult     Precautions / Restrictions Precautions Precautions: Fall Restrictions Weight Bearing Restrictions: No    Mobility  Bed Mobility Overal bed mobility: Needs Assistance Bed Mobility: Supine to Sit     Supine to sit: Min assist     General bed mobility comments: min assist to bring trunk up to sit EOB   Transfers Overall transfer level: Needs assistance Equipment used: Rolling walker (2 wheeled) Transfers: Sit to/from Stand Sit to Stand: Min assist         General transfer comment: VC for hand placement and safety   Ambulation/Gait Ambulation/Gait assistance: Min guard Ambulation Distance (Feet): 10 Feet Assistive device: Rolling walker (2 wheeled) Gait  Pattern/deviations: Step-through pattern;Decreased step length - right;Decreased step length - left;Shuffle;Trunk flexed     General Gait Details: min guard, +2 for equipment and chair follow    Stairs            Wheelchair Mobility    Modified Rankin (Stroke Patients Only)       Balance Overall balance assessment: Needs assistance Sitting-balance support: Bilateral upper extremity supported;Feet supported Sitting balance-Leahy Scale: Good     Standing balance support: Bilateral upper extremity supported;During functional activity Standing balance-Leahy Scale: Fair                              Cognition Arousal/Alertness: Awake/alert Behavior During Therapy: WFL for tasks assessed/performed Overall Cognitive Status: Within Functional Limits for tasks assessed                                        Exercises General Exercises - Lower Extremity Hip ABduction/ADduction: Both;10 reps;Supine Straight Leg Raises: Both;10 reps;Supine Other Exercises Other Exercises: bridges 1x10     General Comments        Pertinent Vitals/Pain Pain Assessment: 0-10 Pain Score: 8  Pain Location: neck Pain Descriptors / Indicators: Aching Pain Intervention(s): Limited activity within patient's tolerance;Monitored during session    Home Living Family/patient expects to be discharged to:: Private residence                    Prior Function  PT Goals (current goals can now be found in the care plan section) Acute Rehab PT Goals Patient Stated Goal: go home PT Goal Formulation: With patient Time For Goal Achievement: 08/09/17 Potential to Achieve Goals: Good Progress towards PT goals: Progressing toward goals    Frequency    Min 3X/week      PT Plan Current plan remains appropriate    Co-evaluation              AM-PAC PT "6 Clicks" Daily Activity  Outcome Measure  Difficulty turning over in bed (including  adjusting bedclothes, sheets and blankets)?: Unable Difficulty moving from lying on back to sitting on the side of the bed? : Unable Difficulty sitting down on and standing up from a chair with arms (e.g., wheelchair, bedside commode, etc,.)?: Unable Help needed moving to and from a bed to chair (including a wheelchair)?: A Little Help needed walking in hospital room?: A Little Help needed climbing 3-5 steps with a railing? : A Lot 6 Click Score: 11    End of Session Equipment Utilized During Treatment: Gait belt Activity Tolerance: Patient limited by fatigue Patient left: in chair;with call bell/phone within reach;with family/visitor present   PT Visit Diagnosis: Unsteadiness on feet (R26.81);Other abnormalities of gait and mobility (R26.89);Muscle weakness (generalized) (M62.81)     Time: 1410-1440 PT Time Calculation (min) (ACUTE ONLY): 30 min  Charges:  $Gait Training: 8-22 mins $Therapeutic Exercise: 8-22 mins                    G Codes:       Deniece Ree PT, DPT, CBIS  Supplemental Physical Therapist [place]

## 2017-08-09 NOTE — Progress Notes (Signed)
Subjective: No new complaints   Antibiotics:  Anti-infectives (From admission, onward)   Start     Dose/Rate Route Frequency Ordered Stop   08/02/17 1400  DAPTOmycin (CUBICIN) 1,000 mg in sodium chloride 0.9 % IVPB     1,000 mg 240 mL/hr over 30 Minutes Intravenous Every 24 hours 08/02/17 1100     08/02/17 1000  rifampin (RIFADIN) capsule 300 mg  Status:  Discontinued     300 mg Oral Every 12 hours 08/02/17 0912 08/08/17 1538   07/31/17 1400  DAPTOmycin (CUBICIN) 700 mg in sodium chloride 0.9 % IVPB  Status:  Discontinued     700 mg 228 mL/hr over 30 Minutes Intravenous Every 24 hours 07/31/17 1254 08/02/17 1100   07/31/17 1330  ceftaroline (TEFLARO) 600 mg in sodium chloride 0.9 % 250 mL IVPB     600 mg 250 mL/hr over 60 Minutes Intravenous Every 12 hours 07/31/17 1240     07/30/17 1000  vancomycin (VANCOCIN) 1,750 mg in sodium chloride 0.9 % 500 mL IVPB  Status:  Discontinued     1,750 mg 250 mL/hr over 120 Minutes Intravenous Every 24 hours 07/30/17 0449 07/31/17 1238   07/29/17 1500  vancomycin (VANCOCIN) 1,500 mg in sodium chloride 0.9 % 500 mL IVPB     1,500 mg 250 mL/hr over 120 Minutes Intravenous Every 12 hours 07/29/17 0704 07/29/17 1818   07/26/17 1400  vancomycin (VANCOCIN) 1,500 mg in sodium chloride 0.9 % 500 mL IVPB  Status:  Discontinued     1,500 mg 250 mL/hr over 120 Minutes Intravenous Every 12 hours 07/26/17 1254 07/29/17 0247   07/25/17 2200  vancomycin (VANCOCIN) 2,500 mg in sodium chloride 0.9 % 500 mL IVPB  Status:  Discontinued     2,500 mg 250 mL/hr over 120 Minutes Intravenous  Once 07/25/17 2124 07/25/17 2210   07/25/17 2130  vancomycin (VANCOCIN) IVPB 1000 mg/200 mL premix  Status:  Discontinued     1,000 mg 200 mL/hr over 60 Minutes Intravenous  Once 07/25/17 2118 07/25/17 2123      Medications: Scheduled Meds: . chlorhexidine  15 mL Mouth Rinse BID  . feeding supplement (ENSURE ENLIVE)  237 mL Oral BID BM  . [START ON 08/10/2017]  folic acid  1 mg Oral Daily  . furosemide  40 mg Intravenous Q8H  . insulin aspart  0-15 Units Subcutaneous TID WC  . insulin aspart  0-5 Units Subcutaneous QHS  . insulin glargine  7 Units Subcutaneous Daily  . lidocaine  1 patch Transdermal Q24H  . metoprolol tartrate  25 mg Oral BID  . potassium chloride  20 mEq Oral BID  . sodium chloride flush  3 mL Intravenous Q12H  . thiamine  100 mg Oral Daily   Continuous Infusions: . sodium chloride    . ceFTAROline (TEFLARO) IV Stopped (08/09/17 1115)  . DAPTOmycin (CUBICIN)  IV 1,000 mg (08/09/17 1437)  . lactated ringers     PRN Meds:.sodium chloride, acetaminophen, hydrALAZINE, HYDROmorphone (DILAUDID) injection, ipratropium-albuterol, [DISCONTINUED] ondansetron **OR** ondansetron (ZOFRAN) IV, sodium chloride flush    Objective: Weight change: -1.7 oz (-4.131 kg)  Intake/Output Summary (Last 24 hours) at 08/09/2017 1625 Last data filed at 08/09/2017 1300 Gross per 24 hour  Intake 240 ml  Output 960 ml  Net -720 ml   Blood pressure 138/82, pulse 90, temperature 99.8 F (37.7 C), temperature source Oral, resp. rate (!) 22, height 5\' 9"  (1.753 m), weight 233 lb 6.4 oz (105.9  kg), SpO2 97 %. Temp:  [98.3 F (36.8 C)-100.1 F (37.8 C)] 99.8 F (37.7 C) (12/19 1624) Pulse Rate:  [80-90] 90 (12/19 1624) Resp:  [14-22] 22 (12/19 1624) BP: (109-141)/(69-82) 138/82 (12/19 1600) SpO2:  [94 %-98 %] 97 % (12/19 1624) Weight:  [233 lb 6.4 oz (105.9 kg)] 233 lb 6.4 oz (105.9 kg) (12/19 9147)  Physical Exam: General: Alert and awake, oriented x3, not in any acute distress. HEENT: anicteric sclera,  EOMI, edentulous CVS regular rate, normal r,  Loud murmur at PMI radiating Chest: clear to auscultation bilaterally, no wheezing, rales or rhonchi Abdomen: soft nontender, nondistended, normal bowel sounds, Extremities: no  clubbing or edema noted bilaterally Skin: no rashes Neuro: nonfocal     CBC:  CBC Latest Ref Rng & Units  08/09/2017 08/08/2017 08/07/2017  WBC 4.0 - 10.5 K/uL 6.5 7.3 6.8  Hemoglobin 13.0 - 17.0 g/dL 8.2(N) 5.6(O) 1.3(Y)  Hematocrit 39.0 - 52.0 % 26.4(L) 27.0(L) 26.3(L)  Platelets 150 - 400 K/uL 294 295 279      BMET Recent Labs    08/08/17 0407 08/09/17 0538  NA 134* 132*  K 3.4* 4.2  CL 100* 95*  CO2 27 28  GLUCOSE 136* 169*  BUN 25* 16  CREATININE 0.93 0.84  CALCIUM 8.5* 8.6*     Liver Panel  No results for input(s): PROT, ALBUMIN, AST, ALT, ALKPHOS, BILITOT, BILIDIR, IBILI in the last 72 hours.     Sedimentation Rate No results for input(s): ESRSEDRATE in the last 72 hours. C-Reactive Protein No results for input(s): CRP in the last 72 hours.  Micro Results: Recent Results (from the past 720 hour(s))  Culture, blood (x 2)     Status: None   Collection Time: 07/25/17  9:09 PM  Result Value Ref Range Status   Specimen Description BLOOD RIGHT HAND  Final   Special Requests   Final    BOTTLES DRAWN AEROBIC AND ANAEROBIC Blood Culture adequate volume   Culture   Final    NO GROWTH 5 DAYS Performed at [facility], [address], [place], [place] [address]    Report Status 07/31/2017 FINAL  Final  Culture, blood (x 2)     Status: Abnormal   Collection Time: 07/25/17  9:14 PM  Result Value Ref Range Status   Specimen Description BLOOD RIGHT ANTECUBITAL  Final   Special Requests IN PEDIATRIC BOTTLE Blood Culture adequate volume  Final   Culture  Setup Time   Final    GRAM POSITIVE COCCI IN PEDIATRIC BOTTLE CRITICAL RESULT CALLED TO, READ BACK BY AND VERIFIED WITH: JESSE GRIMSLEY PHARMD AT 2225 ON [id number] BY SJW Performed at [facility], [address], [place], [place] [address]    Culture METHICILLIN RESISTANT STAPHYLOCOCCUS AUREUS (A)  Final   Report Status 07/28/2017 FINAL  Final   Organism ID, Bacteria METHICILLIN RESISTANT STAPHYLOCOCCUS AUREUS  Final      Susceptibility   Methicillin resistant staphylococcus aureus - MIC*    CIPROFLOXACIN  >=8 RESISTANT Resistant     ERYTHROMYCIN >=8 RESISTANT Resistant     GENTAMICIN <=0.5 SENSITIVE Sensitive     OXACILLIN >=4 RESISTANT Resistant     TETRACYCLINE <=1 SENSITIVE Sensitive     VANCOMYCIN <=0.5 SENSITIVE Sensitive     TRIMETH/SULFA <=10 SENSITIVE Sensitive     CLINDAMYCIN <=0.25 SENSITIVE Sensitive     RIFAMPIN <=0.5 SENSITIVE Sensitive     Inducible Clindamycin NEGATIVE Sensitive     * METHICILLIN RESISTANT  STAPHYLOCOCCUS AUREUS  Blood Culture ID Panel (Reflexed)     Status: Abnormal   Collection Time: 07/25/17  9:14 PM  Result Value Ref Range Status   Enterococcus species NOT DETECTED NOT DETECTED Final   Listeria monocytogenes NOT DETECTED NOT DETECTED Final   Staphylococcus species DETECTED (A) NOT DETECTED Final    Comment: CRITICAL RESULT CALLED TO, READ BACK BY AND VERIFIED WITH: JESSEE GRIMSLEY PHARMD AT 2225 ON [id number] BY SJW    Staphylococcus aureus DETECTED (A) NOT DETECTED Final    Comment: Methicillin (oxacillin)-resistant Staphylococcus aureus (MRSA). MRSA is predictably resistant to beta-lactam antibiotics (except ceftaroline). Preferred therapy is vancomycin unless clinically contraindicated. Patient requires contact precautions if  hospitalized. CRITICAL RESULT CALLED TO, READ BACK BY AND VERIFIED WITH: JESSE GRIMSLEY PHARMD AT 2225 ON [id number] BY SJW    Methicillin resistance DETECTED (A) NOT DETECTED Final    Comment: CRITICAL RESULT CALLED TO, READ BACK BY AND VERIFIED WITH: JESSE GRIMSLEY PHARMD AT 2225 ON [id number] BY SJW    Streptococcus species NOT DETECTED NOT DETECTED Final   Streptococcus agalactiae NOT DETECTED NOT DETECTED Final   Streptococcus pneumoniae NOT DETECTED NOT DETECTED Final   Streptococcus pyogenes NOT DETECTED NOT DETECTED Final   Acinetobacter baumannii NOT DETECTED NOT DETECTED Final   Enterobacteriaceae species NOT DETECTED NOT DETECTED Final   Enterobacter cloacae complex NOT DETECTED NOT DETECTED Final   Escherichia coli NOT  DETECTED NOT DETECTED Final   Klebsiella oxytoca NOT DETECTED NOT DETECTED Final   Klebsiella pneumoniae NOT DETECTED NOT DETECTED Final   Proteus species NOT DETECTED NOT DETECTED Final   Serratia marcescens NOT DETECTED NOT DETECTED Final   Haemophilus influenzae NOT DETECTED NOT DETECTED Final   Neisseria meningitidis NOT DETECTED NOT DETECTED Final   Pseudomonas aeruginosa NOT DETECTED NOT DETECTED Final   Candida albicans NOT DETECTED NOT DETECTED Final   Candida glabrata NOT DETECTED NOT DETECTED Final   Candida krusei NOT DETECTED NOT DETECTED Final   Candida parapsilosis NOT DETECTED NOT DETECTED Final   Candida tropicalis NOT DETECTED NOT DETECTED Final    Comment: Performed at [facility], [address] [place], [place] [address]  MRSA PCR Screening     Status: Abnormal   Collection Time: 07/27/17  6:20 PM  Result Value Ref Range Status   MRSA by PCR POSITIVE (A) NEGATIVE Final    Comment:        The GeneXpert MRSA Assay (FDA approved for NASAL specimens only), is one component of a comprehensive MRSA colonization surveillance program. It is not intended to diagnose MRSA infection nor to guide or monitor treatment for MRSA infections. RESULT CALLED TO, READ BACK BY AND VERIFIED WITH: WOODY,J RN 12.6.18 @2014  ZANDO,C   Culture, blood (routine x 2)     Status: Abnormal   Collection Time: 07/28/17  7:44 AM  Result Value Ref Range Status   Specimen Description BLOOD LEFT HAND  Final   Special Requests IN PEDIATRIC BOTTLE Blood Culture adequate volume  Final   Culture  Setup Time   Final    GRAM POSITIVE COCCI IN CLUSTERS IN PEDIATRIC BOTTLE CRITICAL VALUE NOTED.  VALUE IS CONSISTENT WITH PREVIOUSLY REPORTED AND CALLED VALUE.    Culture (A)  Final    STAPHYLOCOCCUS AUREUS SUSCEPTIBILITIES PERFORMED ON PREVIOUS CULTURE WITHIN THE LAST 5 DAYS. Performed at [facility], [address] [place], [place] [address]    Report Status 07/30/2017 FINAL  Final    Culture, blood (routine x  2)     Status: Abnormal   Collection Time: 07/28/17  7:45 AM  Result Value Ref Range Status   Specimen Description BLOOD RIGHT HAND  Final   Special Requests IN PEDIATRIC BOTTLE Blood Culture adequate volume  Final   Culture  Setup Time   Final    GRAM POSITIVE COCCI IN CLUSTERS IN PEDIATRIC BOTTLE CRITICAL VALUE NOTED.  VALUE IS CONSISTENT WITH PREVIOUSLY REPORTED AND CALLED VALUE.    Culture (A)  Final    STAPHYLOCOCCUS AUREUS SUSCEPTIBILITIES PERFORMED ON PREVIOUS CULTURE WITHIN THE LAST 5 DAYS. Performed at [facility], [address], [place], [place] [address]    Report Status 07/30/2017 FINAL  Final  Culture, blood (Routine X 2) w Reflex to ID Panel     Status: Abnormal   Collection Time: 07/31/17  9:37 AM  Result Value Ref Range Status   Specimen Description BLOOD RIGHT FOREARM  Final   Special Requests   Final    BOTTLES DRAWN AEROBIC AND ANAEROBIC Blood Culture adequate volume   Culture  Setup Time   Final    GRAM POSITIVE COCCI IN CLUSTERS ANAEROBIC BOTTLE ONLY CRITICAL VALUE NOTED.  VALUE IS CONSISTENT WITH PREVIOUSLY REPORTED AND CALLED VALUE. Performed at [facility], [address], [place], [place] [address]    Culture METHICILLIN RESISTANT STAPHYLOCOCCUS AUREUS (A)  Final   Report Status 08/04/2017 FINAL  Final   Organism ID, Bacteria METHICILLIN RESISTANT STAPHYLOCOCCUS AUREUS  Final      Susceptibility   Methicillin resistant staphylococcus aureus - MIC*    CIPROFLOXACIN >=8 RESISTANT Resistant     ERYTHROMYCIN >=8 RESISTANT Resistant     GENTAMICIN <=0.5 SENSITIVE Sensitive     OXACILLIN >=4 RESISTANT Resistant     TETRACYCLINE <=1 SENSITIVE Sensitive     VANCOMYCIN 1 SENSITIVE Sensitive     TRIMETH/SULFA <=10 SENSITIVE Sensitive     CLINDAMYCIN <=0.25 SENSITIVE Sensitive     RIFAMPIN <=0.5 SENSITIVE Sensitive     Inducible Clindamycin NEGATIVE Sensitive     * METHICILLIN RESISTANT STAPHYLOCOCCUS AUREUS   Culture, blood (Routine X 2) w Reflex to ID Panel     Status: Abnormal   Collection Time: 07/31/17  9:37 AM  Result Value Ref Range Status   Specimen Description RIGHT ANTECUBITAL  Final   Special Requests IN PEDIATRIC BOTTLE Blood Culture adequate volume  Final   Culture  Setup Time   Final    GRAM POSITIVE COCCI IN CLUSTERS IN PEDIATRIC BOTTLE CRITICAL VALUE NOTED.  VALUE IS CONSISTENT WITH PREVIOUSLY REPORTED AND CALLED VALUE.    Culture (A)  Final    STAPHYLOCOCCUS AUREUS SUSCEPTIBILITIES PERFORMED ON PREVIOUS CULTURE WITHIN THE LAST 5 DAYS. Performed at [facility], [address], [place], [place] [address]    Report Status 08/04/2017 FINAL  Final  Culture, blood (Routine X 2) w Reflex to ID Panel     Status: None   Collection Time: 08/03/17 12:32 PM  Result Value Ref Range Status   Specimen Description BLOOD LEFT HAND  Final   Special Requests   Final    BOTTLES DRAWN AEROBIC AND ANAEROBIC Blood Culture adequate volume   Culture NO GROWTH 5 DAYS  Final   Report Status 08/08/2017 FINAL  Final  Culture, blood (Routine X 2) w Reflex to ID Panel     Status: None   Collection Time: 08/03/17 12:40 PM  Result Value Ref Range Status   Specimen Description BLOOD LEFT HAND  Final   Special Requests   Final    BOTTLES DRAWN AEROBIC AND ANAEROBIC Blood Culture results may not be optimal due to an inadequate volume of blood received in culture bottles   Culture NO GROWTH 5 DAYS  Final   Report Status 08/08/2017 FINAL  Final    Studies/Results: Dg Chest 2 View  Result Date: 08/08/2017 CLINICAL DATA:  Shortness of breath. EXAM: CHEST  2 VIEW COMPARISON:  07/31/2017 FINDINGS: Motion degraded study. AP and lateral views were obtained. Endotracheal and NG tube seen on the prior study are no longer evident. Asymmetric elevation right hemidiaphragm noted. There is bibasilar collapse/ consolidation with possible tiny bilateral pleural effusions. The cardio pericardial silhouette is  enlarged. The visualized bony structures of the thorax are intact. Telemetry leads overlie the chest. IMPRESSION: 1. Cardiomegaly with vascular congestion. 2. Bibasilar collapse/consolidation with small effusion. Electronically Signed   By: Kennith Center M.D.   On: 08/08/2017 09:46   Mr Cervical Spine W Wo Contrast  Addendum Date: 08/08/2017   ADDENDUM REPORT: 08/08/2017 15:17 ADDENDUM: These results were called by telephone at the time of interpretation on 08/08/2017 at 3:15 pm to the patient's nurse, Tressia Miners,, who verbally acknowledged these results. Electronically Signed   By: Drusilla Kanner M.D.   On: 08/08/2017 15:17   Result Date: 08/08/2017 CLINICAL DATA:  Neck pain and patient with bacterial endocarditis. Question infection. EXAM: MRI CERVICAL SPINE WITHOUT AND WITH CONTRAST TECHNIQUE: Multiplanar and multiecho pulse sequences of the cervical spine, to include the craniocervical junction and cervicothoracic junction, were obtained without and with intravenous contrast. CONTRAST:  20 ml MULTIHANCE GADOBENATE DIMEGLUMINE 529 MG/ML IV SOLN COMPARISON:  CT angiogram of the neck 07/28/2017. FINDINGS: Alignment: There is trace retrolisthesis of C4 on C5. Alignment is otherwise maintained. Vertebrae: Abnormal edema and enhancement are seen in the C4-5 endplates consistent with discitis and osteomyelitis. No epidural abscess is identified. The patient also has abnormal edema and enhancement in the odontoid process and in the anterior arch of C1. Marrow edema and enhancement are also identified at the articulation of the occipital condyles and lateral masses of C1 which appears worse on the right where there is an associated small effusion highly suspicious for septic joint. Cord: Normal signal throughout. Posterior Fossa, vertebral arteries, paraspinal tissues: There is a small fluid collection along the right lateral mass of C2 measuring approximately 1.8 cm AP by 0.3 cm transverse by 0.9 cm  craniocaudal worrisome for abscess. Edema and enhancement are seen and posterior paraspinous musculature, particularly the semispinalis muscle on the right where there is an abscess measuring 1.2 cm AP by 0.3 cm transverse at the level of the C2-3 disc interspace. This abscess likely communicates with the abscess described above. Although no epidural abscess is identified, there is epidural enhancement posterior to C2 and at C4-5 and extensive prevertebral soft tissue edema and enhancement. Disc levels: Patient motion somewhat limits evaluation of the disc interspaces. C1-2:  The central canal is open. C2-3:  Mild disc bulge without stenosis. C3-4:  Minimal disc bulge without stenosis. C4-5: Shallow disc bulge effaces the ventral thecal sac. Uncovertebral disease causes mild to moderate foraminal narrowing, worse on the left. C5-6: There is a shallow disc bulge, uncovertebral disease and facet arthropathy. The ventral thecal sac appears effaced. Severe left and moderate right foraminal narrowing is identified. C6-7: Shallow disc bulge without central canal stenosis. Mild to moderate left foraminal narrowing is seen. The right foramen is open. C7-T1:  Negative. IMPRESSION: Findings consistent  with osteomyelitis in the odontoid process of C2 and anterior arch of C1. Negative for epidural abscess. Marrow edema and enhancement about the articulation of the occipital condyles and lateral masses of C1, worse on the right, consistent osteomyelitis and septic joint. As described above, there is a small abscess along the right lateral mass of C2 in the paraspinous soft tissues. Findings consistent with discitis and osteomyelitis at C4-5. Negative for epidural abscess. Edema and enhancement in paraspinous musculature consistent with myositis. Changes are worse on the right where there is a small abscess in semispinalis muscle at the level of the C2-3 disc interspace. This abscess likely communicates with the abscess along the  right lateral mass of C2 described above. Electronically Signed: By: Drusilla Kanner M.D. On: 08/08/2017 15:11      Assessment/Plan:  INTERVAL HISTORY:   Patient awaiting surgery on Monday  Principal Problem:   Aortic valve endocarditis Active Problems:   MRSA bacteremia   Mitral valve vegetation   Acute respiratory failure (HCC)   Alcohol abuse   Thrombocytopenia (HCC)   Abnormal liver enzymes   Acute encephalopathy   Acute diastolic CHF (congestive heart failure) (HCC)   Sepsis (HCC)   Cerebral septic emboli (HCC)   Hypertensive heart disease with heart failure (HCC)   Endotracheal tube present   Acute respiratory disease    Reginald [name] is a 56 y.o. male with MRSA bacteremia and sepsis and MV endocarditis with peforation and severe MR, possible AV endocarditis, Pneumonia and difficulty clearing blood cultures  #1 MRSA bacteremia with mitral valve and possible aortic valve endocarditis, pneumonia:  I suspect reason that patient was previously with persistently positive blood cultures was because of the massive amount of infection on his mitral valve where he has a very large vegetation.  Persistently positive blood cultures are in known reason for need for valve replacement and I think the likely explanation why he was persistently bacteremic despite being on supratherapeutic levels of vancomycin and the organism showing no evidence of resistance to vancomycin.  That being said we made a move to place him on daptomycin and Teflaro in that setting (another reason for Teflaro was to cover for pneumoina), Rifampin was later added.  Blood cultures now no growth x 5 days and final  I will DC the rifampin since he not have prosthetic material in place that would require this.  For the time being I would prefer to err on being aggressive and continue with both daptomycin and teflaro  He is to have valve replaced on Monday  I would send valve tissue for culture from  [place]  I would plan on giving him 6 weeks of postoperative IV antibiotics  This could be with daptomycin alone potentially  #2 PNA; he has had more than 7 days but will continue with teflaro for now  I will follow peripherally for now.   Dr. Ninetta Lights will be on call this weekend the 22nd and 23rd  Dr. Orvan Falconer will be here on the 24th, 25th, 26th,   Dr. Drue Second on the 27th, 28th, 29th  And  I will be back on the 30, 31 and 1st.     LOS: 15 days   Acey Lav 08/09/2017, 4:25 PM

## 2017-08-09 NOTE — Progress Notes (Signed)
Progress Note  Patient Name: Reginald [name] Date of Encounter: 08/09/2017  Primary Cardiologist: Chrystie NoseKenneth C Hilty, MD   Subjective   Denies dyspnea, was able to sit up in a chair and washed up earlier today.  Has not started physical therapy yet.  Sinus rhythm. Slight increase in temperature to 100.1 F.  Several recent blood cultures negative.  Inpatient Medications    Scheduled Meds: . chlorhexidine  15 mL Mouth Rinse BID  . feeding supplement (ENSURE ENLIVE)  237 mL Oral BID BM  . [START ON 08/10/2017] folic acid  1 mg Oral Daily  . furosemide  40 mg Intravenous Q8H  . insulin aspart  0-15 Units Subcutaneous TID WC  . insulin aspart  0-5 Units Subcutaneous QHS  . insulin glargine  7 Units Subcutaneous Daily  . lidocaine  1 patch Transdermal Q24H  . metoprolol tartrate  25 mg Oral BID  . potassium chloride  20 mEq Oral BID  . sodium chloride flush  3 mL Intravenous Q12H  . thiamine  100 mg Oral Daily   Continuous Infusions: . sodium chloride    . ceFTAROline (TEFLARO) IV Stopped (08/09/17 1115)  . DAPTOmycin (CUBICIN)  IV Stopped (08/08/17 1600)  . lactated ringers     PRN Meds: sodium chloride, acetaminophen, hydrALAZINE, HYDROmorphone (DILAUDID) injection, ipratropium-albuterol, [DISCONTINUED] ondansetron **OR** ondansetron (ZOFRAN) IV, sodium chloride flush   Vital Signs    Vitals:   08/09/17 0607 08/09/17 0800 08/09/17 0835 08/09/17 0929  BP: 139/71 (!) 141/80    Pulse: 80 89 89 85  Resp:  14 (!) 22 17  Temp: 99.4 F (37.4 C) 100.1 F (37.8 C)  99.2 F (37.3 C)  TempSrc: Oral Oral  Oral  SpO2: 98% 96% 98% 97%  Weight: 233 lb 6.4 oz (105.9 kg)     Height:        Intake/Output Summary (Last 24 hours) at 08/09/2017 1130 Last data filed at 08/09/2017 0615 Gross per 24 hour  Intake 240 ml  Output 260 ml  Net -20 ml   Filed Weights   08/08/17 0442 08/08/17 0621 08/09/17 0607  Weight: 242 lb 8.1 oz (110 kg) 231 lb (104.8 kg) 233 lb 6.4 oz (105.9 kg)     Telemetry    Sinus rhythm - Personally Reviewed  ECG    No new tracing- Personally Reviewed  Physical Exam  Obese GEN: No acute distress.   Neck: No JVD Cardiac: RRR, 3/6 apical holosystolic murmur radiating towards the base,nodiastolicmurmurs rubs, or gallops.  Respiratory: Clear to auscultation bilaterally. GI: Soft, nontender, non-distended  MS: No edema; No deformity. Neuro:  Nonfocal  Psych: Normal affect   Labs    Chemistry Recent Labs  Lab 08/03/17 0304  08/07/17 0502 08/08/17 0407 08/09/17 0538  NA 139   < > 132* 134* 132*  K 3.6   < > 3.7 3.4* 4.2  CL 106   < > 99* 100* 95*  CO2 28   < > 26 27 28   GLUCOSE 133*   < > 175* 136* 169*  BUN 31*   < > 20 25* 16  CREATININE 0.84   < > 0.91 0.93 0.84  CALCIUM 8.6*   < > 8.4* 8.5* 8.6*  PROT 7.8  --   --   --   --   ALBUMIN 1.9*  --   --   --   --   AST 34  --   --   --   --   ALT  28  --   --   --   --   ALKPHOS 147*  --   --   --   --   BILITOT 2.3*  --   --   --   --   GFRNONAA >60   < > >60 >60 >60  GFRAA >60   < > >60 >60 >60  ANIONGAP 5   < > 7 7 9    < > = values in this interval not displayed.     Hematology Recent Labs  Lab 08/07/17 0502 08/08/17 0407 08/09/17 0538  WBC 6.8 7.3 6.5  RBC 2.82* 2.86* 2.84*  HGB 8.5* 8.6* 8.6*  HCT 26.3* 27.0* 26.4*  MCV 93.3 94.4 93.0  MCH 30.1 30.1 30.3  MCHC 32.3 31.9 32.6  RDW 13.8 14.2 13.8  PLT 279 295 294    Cardiac EnzymesNo results for input(s): TROPONINI in the last 168 hours. No results for input(s): TROPIPOC in the last 168 hours.   BNPNo results for input(s): BNP, PROBNP in the last 168 hours.   DDimer No results for input(s): DDIMER in the last 168 hours.   Radiology    Dg Chest 2 View  Result Date: 08/08/2017 CLINICAL DATA:  Shortness of breath. EXAM: CHEST  2 VIEW COMPARISON:  07/31/2017 FINDINGS: Motion degraded study. AP and lateral views were obtained. Endotracheal and NG tube seen on the prior study are no longer evident.  Asymmetric elevation right hemidiaphragm noted. There is bibasilar collapse/ consolidation with possible tiny bilateral pleural effusions. The cardio pericardial silhouette is enlarged. The visualized bony structures of the thorax are intact. Telemetry leads overlie the chest. IMPRESSION: 1. Cardiomegaly with vascular congestion. 2. Bibasilar collapse/consolidation with small effusion. Electronically Signed   By: Kennith CenterEric  Mansell M.D.   On: 08/08/2017 09:46   Mr Cervical Spine W Wo Contrast  Addendum Date: 08/08/2017   ADDENDUM REPORT: 08/08/2017 15:17 ADDENDUM: These results were called by telephone at the time of interpretation on 08/08/2017 at 3:15 pm to the patient's nurse, Tressia MinersLisa Rudd,, who verbally acknowledged these results. Electronically Signed   By: Drusilla Kannerhomas  Dalessio M.D.   On: 08/08/2017 15:17   Result Date: 08/08/2017 CLINICAL DATA:  Neck pain and patient with bacterial endocarditis. Question infection. EXAM: MRI CERVICAL SPINE WITHOUT AND WITH CONTRAST TECHNIQUE: Multiplanar and multiecho pulse sequences of the cervical spine, to include the craniocervical junction and cervicothoracic junction, were obtained without and with intravenous contrast. CONTRAST:  20 ml MULTIHANCE GADOBENATE DIMEGLUMINE 529 MG/ML IV SOLN COMPARISON:  CT angiogram of the neck 07/28/2017. FINDINGS: Alignment: There is trace retrolisthesis of C4 on C5. Alignment is otherwise maintained. Vertebrae: Abnormal edema and enhancement are seen in the C4-5 endplates consistent with discitis and osteomyelitis. No epidural abscess is identified. The patient also has abnormal edema and enhancement in the odontoid process and in the anterior arch of C1. Marrow edema and enhancement are also identified at the articulation of the occipital condyles and lateral masses of C1 which appears worse on the right where there is an associated small effusion highly suspicious for septic joint. Cord: Normal signal throughout. Posterior Fossa, vertebral  arteries, paraspinal tissues: There is a small fluid collection along the right lateral mass of C2 measuring approximately 1.8 cm AP by 0.3 cm transverse by 0.9 cm craniocaudal worrisome for abscess. Edema and enhancement are seen and posterior paraspinous musculature, particularly the semispinalis muscle on the right where there is an abscess measuring 1.2 cm AP by 0.3 cm transverse at the level  of the C2-3 disc interspace. This abscess likely communicates with the abscess described above. Although no epidural abscess is identified, there is epidural enhancement posterior to C2 and at C4-5 and extensive prevertebral soft tissue edema and enhancement. Disc levels: Patient motion somewhat limits evaluation of the disc interspaces. C1-2:  The central canal is open. C2-3:  Mild disc bulge without stenosis. C3-4:  Minimal disc bulge without stenosis. C4-5: Shallow disc bulge effaces the ventral thecal sac. Uncovertebral disease causes mild to moderate foraminal narrowing, worse on the left. C5-6: There is a shallow disc bulge, uncovertebral disease and facet arthropathy. The ventral thecal sac appears effaced. Severe left and moderate right foraminal narrowing is identified. C6-7: Shallow disc bulge without central canal stenosis. Mild to moderate left foraminal narrowing is seen. The right foramen is open. C7-T1:  Negative. IMPRESSION: Findings consistent with osteomyelitis in the odontoid process of C2 and anterior arch of C1. Negative for epidural abscess. Marrow edema and enhancement about the articulation of the occipital condyles and lateral masses of C1, worse on the right, consistent osteomyelitis and septic joint. As described above, there is a small abscess along the right lateral mass of C2 in the paraspinous soft tissues. Findings consistent with discitis and osteomyelitis at C4-5. Negative for epidural abscess. Edema and enhancement in paraspinous musculature consistent with myositis. Changes are worse on  the right where there is a small abscess in semispinalis muscle at the level of the C2-3 disc interspace. This abscess likely communicates with the abscess along the right lateral mass of C2 described above. Electronically Signed: By: Drusilla Kanner M.D. On: 08/08/2017 15:11    Cardiac Studies   Cardiac catheterization 08/02/2017:  Mid RCA lesion is 40% stenosed.  Ost 2nd Mrg to 2nd Mrg lesion is 20% stenosed.  Hemodynamic findings consistent with moderate pulmonary hypertension.  1. Mild non-obstructive CAD  TEE 07/27/2017: Study Conclusions  - Left ventricle: Normal LV size. Moderate LVH. Systolic function was normal. The estimated ejection fraction was in the range of 60% to 65%. Wall motion was normal; there were no regional wall motion abnormalities. - Aortic valve: Trileaflet. Small echogenic mass at the tip of the non-coronary cusp. lambl&'s excrescences. Trivial AI. - Aorta: Grade 3 mobile atheroma of the proximal aortic arch. - Mitral valve: Large 2-3 cm mass of the A3 segment of the anterior leaflet, prolapsing across the posterior leaflet. There is a distinct posterior jet which is centrally directed of severe regurgitation, suggestive of perforation. There is dropout seen through the vegetation or in the area of the P3 scallop - not entirely clear, but concerning for possible perforation. There is vegetation to a lesser extent on the posterior leaflet tip. Effective regurgitant orifice (PISA): 1.01 cm^2. Regurgitant volume (PISA): 155 ml. - Left atrium: The atrium was dilated. No evidence of thrombus in the atrial cavity or appendage. - Right atrium: No evidence of thrombus in the atrial cavity or appendage. - Atrial septum: No defect or patent foramen ovale was identified. - Pulmonic valve: No evidence of vegetation.  Impressions:  - Large mass of the A3 segment of the anterior mitral leaflet, prolapsing over the posterior  leaflet. Posteriorly located central regurgitant jet suggestive of possible perforation - in the region of the P3 segment. Endocarditis of the posterior leaflet tip is noted. There is a possible small mass on the non-coronary cusp of the aortic valve with trivial AI - this could be resolved endocarditis or sclerosis as well   Patient Profile  56 y.o. 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  Assessment & Plan      1. CHF: due to MR, probably euvolemic.  Has lost a few more pounds over the last couple of days, hard to know what his "dry weight" is.  Continue same diuretic regimen 2. MRdue to staph endocarditis: plan for MV replacement next Monday with Dr. Donata Clay.  Low-grade temperature increase, will monitor.  White blood cell count remains completely normal.  Could have atelectasis from lying in bed so much. 3. HTN: controlled. 4. Deconditioning: PT ordered, plan to get him ambulatory by time of surgery.    For questions or updates, please contact [facility] Please consult [email or web address] for contact info under Cardiology/STEMI.      Signed, Thurmon Fair, MD  08/09/2017, 11:30 AM

## 2017-08-09 NOTE — Progress Notes (Signed)
2 Days Post-Op Procedure(s) (LRB): Extraction of tooth #'s 6,11,12,14,19-29 and 32 with alveoloplasty (N/A) Subjective: MRSA mitral endocarditis with mitral regurgitation Blood cultures now negative, patient afebrile Patient remains very sedentary and bedbound Patient only able to walk 10 feet today with physical therapy Patient will need much better conditioning and exercise tolerance prior to scheduling mitral valve surgery.With underlying significant COPD and edematous lungs from MR will be at high risk for postop pneumonia and ventilator dependence. We'll follow.  Objective: Vital signs in last 24 hours: Temp:  [98.3 F (36.8 C)-100.1 F (37.8 C)] 99.8 F (37.7 C) (12/19 1624) Pulse Rate:  [80-90] 90 (12/19 1624) Cardiac Rhythm: Normal sinus rhythm (12/19 1200) Resp:  [14-22] 22 (12/19 1624) BP: (109-141)/(69-82) 138/82 (12/19 1600) SpO2:  [94 %-98 %] 97 % (12/19 1624) Weight:  [233 lb 6.4 oz (105.9 kg)] 233 lb 6.4 oz (105.9 kg) (12/19 0607)  Hemodynamic parameters for last 24 hours:    Intake/Output from previous day: 12/18 0701 - 12/19 0700 In: 480 [P.O.:480] Out: 260 [Urine:260] Intake/Output this shift: Total I/O In: 240 [P.O.:240] Out: 700 [Urine:700]    Lab Results: Recent Labs    08/08/17 0407 08/09/17 0538  WBC 7.3 6.5  HGB 8.6* 8.6*  HCT 27.0* 26.4*  PLT 295 294   BMET:  Recent Labs    08/08/17 0407 08/09/17 0538  NA 134* 132*  K 3.4* 4.2  CL 100* 95*  CO2 27 28  GLUCOSE 136* 169*  BUN 25* 16  CREATININE 0.93 0.84  CALCIUM 8.5* 8.6*    PT/INR: No results for input(s): LABPROT, INR in the last 72 hours. ABG    Component Value Date/Time   PHART 7.505 (H) 08/02/2017 1553   HCO3 29.9 (H) 08/02/2017 1553   TCO2 31 08/02/2017 1553   O2SAT 93.0 08/02/2017 1553   CBG (last 3)  Recent Labs    08/09/17 0605 08/09/17 1115 08/09/17 1616  GLUCAP 197* 153* 286*    Assessment/Plan: S/P Procedure(s) (LRB): Extraction of tooth #'s  6,11,12,14,19-29 and 32 with alveoloplasty (N/A) Patient tolerating mechanical soft diet He's more physical therapy prior to surgery May need admission to CIR for pre-rehabilitation   LOS: 15 days    Kathlee Nationseter Van Trigt III 08/09/2017

## 2017-08-09 NOTE — Progress Notes (Signed)
Occupational Therapy Treatment Patient Details Name: Reginald [name] MRN: [id number] DOB: 04/06/1961 Today's Date: 08/09/2017    History of present illness [facility] with MRSA endocarditis of mitral valve with moderate-severe MR. Pt developed agitation on 12/5 thought to be ETOH withdrawal. MRI on 12/5 done and showed multiple areas bilaterally of septic emboli. Pt with severe encephalopathy and respiratory failure and was intubated on 12/6. Pt extubated on 12/10. PMH - DM, ETOH, obesity. [facility]. Underwent dental extraction 08/07/17 in preparation for mitral valve replacement.   OT comments  Pt demonstrating progress toward OT goals this session. He was able to complete functional mobility to sink with RW and min assist this session as well as stand for oral care tasks without therapeutic rest breaks. Pt with significant fatigue following these activities and continues to demonstrate decreased activity tolerance for ADL and generalized weakness. He remains a good candidate for CIR level rehabilitation to maximize return to PLOF. Will continue to follow while admitted.    Follow Up Recommendations  CIR    Equipment Recommendations  Other (comment)(TBD at next level of care)    Recommendations for Other Services      Precautions / Restrictions Precautions Precautions: Fall Restrictions Weight Bearing Restrictions: No       Mobility Bed Mobility Overal bed mobility: Needs Assistance Bed Mobility: Supine to Sit     Supine to sit: Min assist Sit to supine: +2 for physical assistance;Min assist   General bed mobility comments: min assist to bring trunk up to sit EOB   Transfers Overall transfer level: Needs assistance Equipment used: Rolling walker (2 wheeled) Transfers: Sit to/from Stand Sit to Stand: Min assist         General transfer comment: VC for hand placement and safety     Balance  Overall balance assessment: Needs assistance Sitting-balance support: Bilateral upper extremity supported;Feet supported Sitting balance-Leahy Scale: Good     Standing balance support: Bilateral upper extremity supported;During functional activity Standing balance-Leahy Scale: Fair Standing balance comment: walker and min assist for static standing                           ADL either performed or assessed with clinical judgement   ADL Overall ADL's : Needs assistance/impaired     Grooming: Wash/dry hands;Wash/dry face;Standing;Minimal assistance Grooming Details (indicate cue type and reason): Poor balance, unsteady Upper Body Bathing: Min guard;Sitting   Lower Body Bathing: Maximal assistance   Upper Body Dressing : Min guard;Sitting   Lower Body Dressing: Maximal assistance   Toilet Transfer: Ambulation;RW;Comfort height toilet;Moderate assistance;+2 for safety/equipment;Cueing for safety   Toileting- Clothing Manipulation and Hygiene: Moderate assistance;Sit to/from stand   Tub/ Engineer, structuralhower Transfer: Moderate assistance;+2 for safety/equipment;Rolling walker;Ambulation;3 in 1;Grab bars   Functional mobility during ADLs: Moderate assistance;+2 for safety/equipment;Rolling walker;Cueing for safety General ADL Comments: verbal cues for correct hand placement, pt encouraged to sit up in recliner vs returning to bed as he requested     Vision       Perception     Praxis      Cognition Arousal/Alertness: Awake/alert Behavior During Therapy: WFL for tasks assessed/performed Overall Cognitive Status: Impaired/Different from baseline Area of Impairment: Attention;Safety/judgement;Following commands;Problem solving                   Current Attention Level: Selective   Following Commands: Follows one step commands consistently;Follows  one step commands with increased time Safety/Judgement: Decreased awareness of safety;Decreased awareness of deficits    Problem Solving: Slow processing;Difficulty sequencing;Requires verbal cues;Requires tactile cues General Comments: Some increased time for processing and decreased attention this session.         Exercises General Exercises - Lower Extremity Hip ABduction/ADduction: Both;10 reps;Supine Straight Leg Raises: Both;10 reps;Supine Other Exercises Other Exercises: bridges 1x10    Shoulder Instructions       General Comments      Pertinent Vitals/ Pain       Pain Assessment: 0-10 Pain Score: 8  Pain Location: neck Pain Descriptors / Indicators: Aching Pain Intervention(s): Limited activity within patient's tolerance;Monitored during session  Home Living Family/patient expects to be discharged to:: Private residence                                        Prior Functioning/Environment              Frequency  Min 2X/week        Progress Toward Goals  OT Goals(current goals can now be found in the care plan section)     Acute Rehab OT Goals Patient Stated Goal: go home OT Goal Formulation: With patient Time For Goal Achievement: 08/18/17 Potential to Achieve Goals: Good  Plan      Co-evaluation    PT/OT/SLP Co-Evaluation/Treatment: Yes Reason for Co-Treatment: For patient/therapist safety;Complexity of the patient's impairments (multi-system involvement)   OT goals addressed during session: ADL's and self-care      AM-PAC PT "6 Clicks" Daily Activity     Outcome Measure   Help from another person eating meals?: None(NPO) Help from another person taking care of personal grooming?: A Little Help from another person toileting, which includes using toliet, bedpan, or urinal?: A Lot Help from another person bathing (including washing, rinsing, drying)?: A Lot Help from another person to put on and taking off regular upper body clothing?: A Little Help from another person to put on and taking off regular lower body clothing?: A Lot 6 Click  Score: 16    End of Session Equipment Utilized During Treatment: Gait belt;Rolling walker  OT Visit Diagnosis: Unsteadiness on feet (R26.81);Muscle weakness (generalized) (M62.81);Other symptoms and signs involving cognitive function;Pain Pain - Right/Left: (bilateral) Pain - part of body: (neck)   Activity Tolerance Patient tolerated treatment well   Patient Left in chair;with call bell/phone within reach;with bed alarm set   Nurse Communication      Functional Assessment Tool Used: AM-PAC 6 Clicks Daily Activity   Time: 1420-1445 OT Time Calculation (min): 25 min  Charges: OT G-codes **NOT FOR INPATIENT CLASS**  OT General Charges $OT Visit: 1 Visit OT Treatments $Self Care/Home Management : 8-22 mins  Doristine Sectionharity A Chesney Suares, MS OTR/L  Pager: [phone]    Reginald [name] 08/09/2017, 5:51 PM

## 2017-08-09 NOTE — Progress Notes (Signed)
PROGRESS NOTE    Reginald [name]  ZOX:[id number] DOB: March 06, 1961 DOA: 07/25/2017 PCP: Patient, No Pcp Per      Brief Narrative:  Unfortunate 56 yo M with alcohol use d/o, HFpEF, COPD, DM who presented from [place] with malaise, found to have MRSA bacteremia. Bacteremia complicated by septic emboli, endocarditis (2cm MV veg), pneumonia, and now vertebral osteomyelitis.   Assessment & Plan:  Principal Problem:   Aortic valve endocarditis Active Problems:   MRSA bacteremia   Mitral valve vegetation   Acute respiratory failure (HCC)   Alcohol abuse   Thrombocytopenia (HCC)   Abnormal liver enzymes   Acute encephalopathy   Acute diastolic CHF (congestive heart failure) (HCC)   Sepsis (HCC)   Cerebral septic emboli (HCC)   Hypertensive heart disease with heart failure (HCC)   Endotracheal tube present   Acute respiratory disease     MRSA bacteremia  MV endocarditis, large vegetation Possible AV endocarditis CNS, septic emboli to brain Possible multifocal pneumonia on CT C1 vertebral osteo, C4 discitis, paraspinal abscess -Continue daptomycin, ceftaroline -Consult to ID, appreciate cares -Consult to CVTS, appreciate cares -Consult to Cardiology, appreciate Cares     C1 vertebral osteomyelitis/C1-occiput septic joint/paraspinal abscesses ~2cm x2/C4-5 discitis Patient neurologically intact.  No epidural abscess or spinal cord impingement on imaging.  Discussed with Dr. Wynetta Emery last night; no role for debridement in his opinion after reviewing imaging, medical mgmt alone.   -Repeat MRI C-spine in 4-6 weeks. -Will discuss MR remainder spine with ID when able  S/p dental extraction from Chronic apical periodontitis/Retained root segments/Dental caries/Loose teeth on 12/17  Encephalopathy Alcohol delirium Resolved.  Multifactorial from septic emboli, alcohol withdrawal (requiring Precedex and intubation twice, once at [place] and once here) -Continue thiamine  Chronic  normocytic anemia Stable around 8 -Trend Hgb  Hypertension Acute on chronic diastolic CHF Severe mitral regurgitation I/Os not documented.  About 17L negative on admission. Cr stable. -Continue metoprolol -Continue furosemide and K -Daily weights -Daily BMP -Consult to CVTS, Cardiology, appreciate cares  Diabetes Glucoses okay, only 1 >180 last two days. -Start Lantus 7 daily -Continue SSI     DVT prophylaxis: SCDs Code Status: FULL Family Communication: None present Disposition Plan: Tentatively planning MV replacement on Monday. Not sure how this vertebral disease affects that.   Consultants:   CVTS  ID  Cardiology   Antimicrobials:   Vancomycin 11/29 >> 12/10  Rifampin 12/13 >>12/18  Ceftaroline 12/10 >>  Daptomycin 12/10 >>   Subjective: Mouth still sore.  No new fever, weakness, confusion.  Bowels moving well.  No paresthesias or weakness in arms, legs.     Objective: Vitals:   08/08/17 1745 08/08/17 2000 08/08/17 2351 08/09/17 0607  BP:  109/79 128/74 139/71  Pulse: 84 89 83 80  Resp: 15 15 20    Temp:  99 F (37.2 C) 98.3 F (36.8 C) 99.4 F (37.4 C)  TempSrc:  Oral Oral Oral  SpO2: 97% 98% 98% 98%  Weight:    105.9 kg (233 lb 6.4 oz)  Height:        Intake/Output Summary (Last 24 hours) at 08/09/2017 0744 Last data filed at 08/09/2017 0615 Gross per 24 hour  Intake 480 ml  Output 260 ml  Net 220 ml   Filed Weights   08/08/17 0442 08/08/17 0621 08/09/17 0607  Weight: 110 kg (242 lb 8.1 oz) 104.8 kg (231 lb) 105.9 kg (233 lb 6.4 oz)    Examination: General appearance: Obese ill-appearing adult male, alert and in  no acute distress.  Debilitated. HEENT: Anicteric, conjunctiva pink, lids and lashes normal. No nasal deformity, discharge, epistaxis.  Lips moist.  Neck still stiff, pain with movement.     Cardiac: RRR, nl S1-S2, systolic murmur present.  Capillary refill is brisk.   Respiratory: Normal respiratory rate and rhythm.   CTAB without rales or wheezes. Abdomen: Abdomen soft.  Mild nonfocal TTP. No ascites, distension, hepatosplenomegaly.   MSK: No deformities or effusions.  Neck stiff.  Slight pain in left elbow without effusion, swelling. Neuro: Awake and alert.  EOMI, moves all extremities, 5-/5 strength in upper and lower extremities bilaterally. Speech fluent.    Psych: Sensorium intact and responding to questions, attention normal. Affect nrmal.  Judgment and insight appear normal.    Data Reviewed: I have personally reviewed following labs and imaging studies:  CBC: Recent Labs  Lab 08/05/17 2351 08/06/17 0240 08/07/17 0502 08/08/17 0407 08/09/17 0538  WBC 7.3 7.4 6.8 7.3 6.5  HGB 9.2* 9.0* 8.5* 8.6* 8.6*  HCT 28.7* 28.2* 26.3* 27.0* 26.4*  MCV 95.3 94.9 93.3 94.4 93.0  PLT 309 305 279 295 294   Basic Metabolic Panel: Recent Labs  Lab 08/04/17 0231 08/05/17 2351 08/07/17 0502 08/08/17 0407 08/09/17 0538  NA 135 131* 132* 134* 132*  K 3.4* 3.9 3.7 3.4* 4.2  CL 103 98* 99* 100* 95*  CO2 25 25 26 27 28   GLUCOSE 120* 111* 175* 136* 169*  BUN 27* 25* 20 25* 16  CREATININE 0.85 0.91 0.91 0.93 0.84  CALCIUM 8.7* 8.5* 8.4* 8.5* 8.6*   GFR: Estimated Creatinine Clearance: 116.4 mL/min (by C-G formula based on SCr of 0.84 mg/dL). Liver Function Tests: Recent Labs  Lab 08/03/17 0304  AST 34  ALT 28  ALKPHOS 147*  BILITOT 2.3*  PROT 7.8  ALBUMIN 1.9*   No results for input(s): LIPASE, AMYLASE in the last 168 hours. Recent Labs  Lab 08/02/17 1049  AMMONIA 101*   Coagulation Profile: No results for input(s): INR, PROTIME in the last 168 hours. Cardiac Enzymes: Recent Labs  Lab 08/07/17 0502  CKTOTAL 19*   BNP (last 3 results) No results for input(s): PROBNP in the last 8760 hours. HbA1C: No results for input(s): HGBA1C in the last 72 hours. CBG: Recent Labs  Lab 08/08/17 0623 08/08/17 1226 08/08/17 1655 08/08/17 2059 08/09/17 0605  GLUCAP 152* 256* 139* 205* 197*    Lipid Profile: Recent Labs    08/08/17 0407  TRIG 290*   Thyroid Function Tests: No results for input(s): TSH, T4TOTAL, FREET4, T3FREE, THYROIDAB in the last 72 hours. Anemia Panel: No results for input(s): VITAMINB12, FOLATE, FERRITIN, TIBC, IRON, RETICCTPCT in the last 72 hours. Urine analysis:    Component Value Date/Time   COLORURINE STRAW (A) 07/28/2017 1330   APPEARANCEUR CLEAR 07/28/2017 1330   LABSPEC 1.010 07/28/2017 1330   PHURINE 5.0 07/28/2017 1330   GLUCOSEU NEGATIVE 07/28/2017 1330   HGBUR NEGATIVE 07/28/2017 1330   BILIRUBINUR NEGATIVE 07/28/2017 1330   KETONESUR NEGATIVE 07/28/2017 1330   PROTEINUR NEGATIVE 07/28/2017 1330   NITRITE NEGATIVE 07/28/2017 1330   LEUKOCYTESUR NEGATIVE 07/28/2017 1330   Sepsis Labs: @LABRCNTIP (procalcitonin:4,lacticidven:4)  ) Recent Results (from the past 240 hour(s))  Culture, blood (Routine X 2) w Reflex to ID Panel     Status: Abnormal   Collection Time: 07/31/17  9:37 AM  Result Value Ref Range Status   Specimen Description BLOOD RIGHT FOREARM  Final   Special Requests   Final    BOTTLES  DRAWN AEROBIC AND ANAEROBIC Blood Culture adequate volume   Culture  Setup Time   Final    GRAM POSITIVE COCCI IN CLUSTERS ANAEROBIC BOTTLE ONLY CRITICAL VALUE NOTED.  VALUE IS CONSISTENT WITH PREVIOUSLY REPORTED AND CALLED VALUE. Performed at [facility], [address], [place], [place] [address]    Culture METHICILLIN RESISTANT STAPHYLOCOCCUS AUREUS (A)  Final   Report Status 08/04/2017 FINAL  Final   Organism ID, Bacteria METHICILLIN RESISTANT STAPHYLOCOCCUS AUREUS  Final      Susceptibility   Methicillin resistant staphylococcus aureus - MIC*    CIPROFLOXACIN >=8 RESISTANT Resistant     ERYTHROMYCIN >=8 RESISTANT Resistant     GENTAMICIN <=0.5 SENSITIVE Sensitive     OXACILLIN >=4 RESISTANT Resistant     TETRACYCLINE <=1 SENSITIVE Sensitive     VANCOMYCIN 1 SENSITIVE Sensitive     TRIMETH/SULFA <=10 SENSITIVE  Sensitive     CLINDAMYCIN <=0.25 SENSITIVE Sensitive     RIFAMPIN <=0.5 SENSITIVE Sensitive     Inducible Clindamycin NEGATIVE Sensitive     * METHICILLIN RESISTANT STAPHYLOCOCCUS AUREUS  Culture, blood (Routine X 2) w Reflex to ID Panel     Status: Abnormal   Collection Time: 07/31/17  9:37 AM  Result Value Ref Range Status   Specimen Description RIGHT ANTECUBITAL  Final   Special Requests IN PEDIATRIC BOTTLE Blood Culture adequate volume  Final   Culture  Setup Time   Final    GRAM POSITIVE COCCI IN CLUSTERS IN PEDIATRIC BOTTLE CRITICAL VALUE NOTED.  VALUE IS CONSISTENT WITH PREVIOUSLY REPORTED AND CALLED VALUE.    Culture (A)  Final    STAPHYLOCOCCUS AUREUS SUSCEPTIBILITIES PERFORMED ON PREVIOUS CULTURE WITHIN THE LAST 5 DAYS. Performed at [facility], [address] [place], [place] [address]    Report Status 08/04/2017 FINAL  Final  Culture, blood (Routine X 2) w Reflex to ID Panel     Status: None   Collection Time: 08/03/17 12:32 PM  Result Value Ref Range Status   Specimen Description BLOOD LEFT HAND  Final   Special Requests   Final    BOTTLES DRAWN AEROBIC AND ANAEROBIC Blood Culture adequate volume   Culture NO GROWTH 5 DAYS  Final   Report Status 08/08/2017 FINAL  Final  Culture, blood (Routine X 2) w Reflex to ID Panel     Status: None   Collection Time: 08/03/17 12:40 PM  Result Value Ref Range Status   Specimen Description BLOOD LEFT HAND  Final   Special Requests   Final    BOTTLES DRAWN AEROBIC AND ANAEROBIC Blood Culture results may not be optimal due to an inadequate volume of blood received in culture bottles   Culture NO GROWTH 5 DAYS  Final   Report Status 08/08/2017 FINAL  Final         Radiology Studies: Dg Chest 2 View  Result Date: 08/08/2017 CLINICAL DATA:  Shortness of breath. EXAM: CHEST  2 VIEW COMPARISON:  07/31/2017 FINDINGS: Motion degraded study. AP and lateral views were obtained. Endotracheal and NG tube seen on the prior  study are no longer evident. Asymmetric elevation right hemidiaphragm noted. There is bibasilar collapse/ consolidation with possible tiny bilateral pleural effusions. The cardio pericardial silhouette is enlarged. The visualized bony structures of the thorax are intact. Telemetry leads overlie the chest. IMPRESSION: 1. Cardiomegaly with vascular congestion. 2. Bibasilar collapse/consolidation with small effusion. Electronically Signed   By: Kennith Center M.D.   On: 08/08/2017 09:46  Mr Cervical Spine W Wo Contrast  Addendum Date: 08/08/2017   ADDENDUM REPORT: 08/08/2017 15:17 ADDENDUM: These results were called by telephone at the time of interpretation on 08/08/2017 at 3:15 pm to the patient's nurse, Tressia MinersLisa Rudd,, who verbally acknowledged these results. Electronically Signed   By: Drusilla Kannerhomas  Dalessio M.D.   On: 08/08/2017 15:17   Result Date: 08/08/2017 CLINICAL DATA:  Neck pain and patient with bacterial endocarditis. Question infection. EXAM: MRI CERVICAL SPINE WITHOUT AND WITH CONTRAST TECHNIQUE: Multiplanar and multiecho pulse sequences of the cervical spine, to include the craniocervical junction and cervicothoracic junction, were obtained without and with intravenous contrast. CONTRAST:  20 ml MULTIHANCE GADOBENATE DIMEGLUMINE 529 MG/ML IV SOLN COMPARISON:  CT angiogram of the neck 07/28/2017. FINDINGS: Alignment: There is trace retrolisthesis of C4 on C5. Alignment is otherwise maintained. Vertebrae: Abnormal edema and enhancement are seen in the C4-5 endplates consistent with discitis and osteomyelitis. No epidural abscess is identified. The patient also has abnormal edema and enhancement in the odontoid process and in the anterior arch of C1. Marrow edema and enhancement are also identified at the articulation of the occipital condyles and lateral masses of C1 which appears worse on the right where there is an associated small effusion highly suspicious for septic joint. Cord: Normal signal  throughout. Posterior Fossa, vertebral arteries, paraspinal tissues: There is a small fluid collection along the right lateral mass of C2 measuring approximately 1.8 cm AP by 0.3 cm transverse by 0.9 cm craniocaudal worrisome for abscess. Edema and enhancement are seen and posterior paraspinous musculature, particularly the semispinalis muscle on the right where there is an abscess measuring 1.2 cm AP by 0.3 cm transverse at the level of the C2-3 disc interspace. This abscess likely communicates with the abscess described above. Although no epidural abscess is identified, there is epidural enhancement posterior to C2 and at C4-5 and extensive prevertebral soft tissue edema and enhancement. Disc levels: Patient motion somewhat limits evaluation of the disc interspaces. C1-2:  The central canal is open. C2-3:  Mild disc bulge without stenosis. C3-4:  Minimal disc bulge without stenosis. C4-5: Shallow disc bulge effaces the ventral thecal sac. Uncovertebral disease causes mild to moderate foraminal narrowing, worse on the left. C5-6: There is a shallow disc bulge, uncovertebral disease and facet arthropathy. The ventral thecal sac appears effaced. Severe left and moderate right foraminal narrowing is identified. C6-7: Shallow disc bulge without central canal stenosis. Mild to moderate left foraminal narrowing is seen. The right foramen is open. C7-T1:  Negative. IMPRESSION: Findings consistent with osteomyelitis in the odontoid process of C2 and anterior arch of C1. Negative for epidural abscess. Marrow edema and enhancement about the articulation of the occipital condyles and lateral masses of C1, worse on the right, consistent osteomyelitis and septic joint. As described above, there is a small abscess along the right lateral mass of C2 in the paraspinous soft tissues. Findings consistent with discitis and osteomyelitis at C4-5. Negative for epidural abscess. Edema and enhancement in paraspinous musculature  consistent with myositis. Changes are worse on the right where there is a small abscess in semispinalis muscle at the level of the C2-3 disc interspace. This abscess likely communicates with the abscess along the right lateral mass of C2 described above. Electronically Signed: By: Drusilla Kannerhomas  Dalessio M.D. On: 08/08/2017 15:11        Scheduled Meds: . chlorhexidine  15 mL Mouth Rinse BID  . feeding supplement (ENSURE ENLIVE)  237 mL Oral BID BM  . folic  acid  1 mg Intravenous Daily  . furosemide  40 mg Intravenous Q8H  . insulin aspart  0-15 Units Subcutaneous TID WC  . insulin aspart  0-5 Units Subcutaneous QHS  . insulin glargine  7 Units Subcutaneous Daily  . lidocaine  1 patch Transdermal Q24H  . metoprolol tartrate  25 mg Oral BID  . potassium chloride  20 mEq Oral BID  . sodium chloride flush  3 mL Intravenous Q12H  . thiamine  100 mg Intravenous Daily   Or  . thiamine  100 mg Oral Daily   Continuous Infusions: . sodium chloride    . ceFTAROline (TEFLARO) IV Stopped (08/08/17 2301)  . DAPTOmycin (CUBICIN)  IV Stopped (08/08/17 1600)  . lactated ringers       LOS: 15 days    Time spent: 20 minutes    Alberteen Sam, MD [facility] Pager [phone]  If 7PM-7AM, please contact night-coverage [email or web address] Password [facility] 08/09/2017, 7:44 AM

## 2017-08-09 NOTE — Progress Notes (Signed)
POST OPERATIVE NOTE:  08/09/2017 Reginald [name] [id number]  VITALS: BP 121/69 (BP Location: Right Arm)   Pulse 85   Temp 99.4 F (37.4 C) (Oral)   Resp 20   Ht 5\' 9"  (1.753 m)   Wt 233 lb 6.4 oz (105.9 kg)   SpO2 94%   BMI 34.47 kg/m   LABS:  Lab Results  Component Value Date   WBC 6.5 08/09/2017   HGB 8.6 (L) 08/09/2017   HCT 26.4 (L) 08/09/2017   MCV 93.0 08/09/2017   PLT 294 08/09/2017   BMET    Component Value Date/Time   NA 132 (L) 08/09/2017 0538   K 4.2 08/09/2017 0538   CL 95 (L) 08/09/2017 0538   CO2 28 08/09/2017 0538   GLUCOSE 169 (H) 08/09/2017 0538   BUN 16 08/09/2017 0538   CREATININE 0.84 08/09/2017 0538   CALCIUM 8.6 (L) 08/09/2017 0538   GFRNONAA >60 08/09/2017 0538   GFRAA >60 08/09/2017 0538    Lab Results  Component Value Date   INR 1.25 08/02/2017   INR 1.14 07/30/2017   No results found for: PTT   Seanpatrick Laswell is status post extraction of remaining teeth with alveoloplasty in the operating room with general anesthesia on 08/07/2017.  SUBJECTIVE: Patient with minimal complaints from his dental extraction sites.  Patient denies having any active bleeding.  EXAM: There is no sign of infection, heme, or ooze within the mouth.  Sutures are intact.  Patient is healing in by generalized primary closure.  Patient has a moist mouth.  Patient is now edentulous.  There is atrophy of the edentulous alveolar ridges.   ASSESSMENT: 1.  Post operative course is consistent with dental procedures performed the operating room with general anesthesia 2.  Patient is edentulous 3.  There is atrophy of the edentulous alveolar ridges.   PLAN: 1.  Continue chlorhexidine rinses twice daily as prescribed. 2.  Patient is cleared for heart valve surgery as indicated. 3.  Follow-up with dental medicine once he is medically stable from the anticipated heart valve surgery as an outpatient. 4.  Patient to follow-up with a dentist of his choice for  fabrication of upper and lower complete dentures after adequate healing and once medically stable from the anticipated heart valve surgery.   [name], DDS

## 2017-08-10 ENCOUNTER — Encounter (HOSPITAL_COMMUNITY): Payer: Self-pay

## 2017-08-10 ENCOUNTER — Inpatient Hospital Stay (HOSPITAL_COMMUNITY): Payer: Medicaid Other

## 2017-08-10 ENCOUNTER — Encounter (HOSPITAL_COMMUNITY): Payer: Self-pay | Admitting: Infectious Disease

## 2017-08-10 ENCOUNTER — Inpatient Hospital Stay (HOSPITAL_COMMUNITY)
Admission: RE | Admit: 2017-08-10 | Discharge: 2017-08-20 | DRG: 945 | Disposition: A | Discharge status: Other Acute Inpatient Hospital | Payer: Medicaid Other | Source: Intra-hospital | Attending: Physical Medicine & Rehabilitation | Admitting: Physical Medicine & Rehabilitation

## 2017-08-10 DIAGNOSIS — E669 Obesity, unspecified: Secondary | ICD-10-CM | POA: Diagnosis present

## 2017-08-10 DIAGNOSIS — I10 Essential (primary) hypertension: Secondary | ICD-10-CM

## 2017-08-10 DIAGNOSIS — E1142 Type 2 diabetes mellitus with diabetic polyneuropathy: Secondary | ICD-10-CM | POA: Diagnosis present

## 2017-08-10 DIAGNOSIS — B9562 Methicillin resistant Staphylococcus aureus infection as the cause of diseases classified elsewhere: Secondary | ICD-10-CM | POA: Diagnosis present

## 2017-08-10 DIAGNOSIS — B9689 Other specified bacterial agents as the cause of diseases classified elsewhere: Secondary | ICD-10-CM | POA: Diagnosis present

## 2017-08-10 DIAGNOSIS — J449 Chronic obstructive pulmonary disease, unspecified: Secondary | ICD-10-CM | POA: Diagnosis present

## 2017-08-10 DIAGNOSIS — R5381 Other malaise: Secondary | ICD-10-CM | POA: Diagnosis present

## 2017-08-10 DIAGNOSIS — D638 Anemia in other chronic diseases classified elsewhere: Secondary | ICD-10-CM | POA: Diagnosis present

## 2017-08-10 DIAGNOSIS — I5032 Chronic diastolic (congestive) heart failure: Secondary | ICD-10-CM | POA: Diagnosis present

## 2017-08-10 DIAGNOSIS — E871 Hypo-osmolality and hyponatremia: Secondary | ICD-10-CM

## 2017-08-10 DIAGNOSIS — L539 Erythematous condition, unspecified: Secondary | ICD-10-CM

## 2017-08-10 DIAGNOSIS — G4733 Obstructive sleep apnea (adult) (pediatric): Secondary | ICD-10-CM | POA: Diagnosis present

## 2017-08-10 DIAGNOSIS — D62 Acute posthemorrhagic anemia: Secondary | ICD-10-CM

## 2017-08-10 DIAGNOSIS — I714 Abdominal aortic aneurysm, without rupture: Secondary | ICD-10-CM | POA: Diagnosis present

## 2017-08-10 DIAGNOSIS — I340 Nonrheumatic mitral (valve) insufficiency: Secondary | ICD-10-CM | POA: Diagnosis present

## 2017-08-10 DIAGNOSIS — Z794 Long term (current) use of insulin: Secondary | ICD-10-CM

## 2017-08-10 DIAGNOSIS — I058 Other rheumatic mitral valve diseases: Secondary | ICD-10-CM

## 2017-08-10 DIAGNOSIS — R52 Pain, unspecified: Secondary | ICD-10-CM

## 2017-08-10 DIAGNOSIS — I76 Septic arterial embolism: Secondary | ICD-10-CM | POA: Diagnosis present

## 2017-08-10 DIAGNOSIS — R7881 Bacteremia: Secondary | ICD-10-CM | POA: Diagnosis present

## 2017-08-10 DIAGNOSIS — Z79899 Other long term (current) drug therapy: Secondary | ICD-10-CM

## 2017-08-10 DIAGNOSIS — Z8249 Family history of ischemic heart disease and other diseases of the circulatory system: Secondary | ICD-10-CM

## 2017-08-10 DIAGNOSIS — J9600 Acute respiratory failure, unspecified whether with hypoxia or hypercapnia: Secondary | ICD-10-CM | POA: Diagnosis present

## 2017-08-10 DIAGNOSIS — I059 Rheumatic mitral valve disease, unspecified: Secondary | ICD-10-CM

## 2017-08-10 DIAGNOSIS — Z01810 Encounter for preprocedural cardiovascular examination: Secondary | ICD-10-CM

## 2017-08-10 DIAGNOSIS — M4622 Osteomyelitis of vertebra, cervical region: Secondary | ICD-10-CM | POA: Diagnosis present

## 2017-08-10 DIAGNOSIS — I110 Hypertensive heart disease with heart failure: Secondary | ICD-10-CM | POA: Diagnosis present

## 2017-08-10 DIAGNOSIS — Z885 Allergy status to narcotic agent status: Secondary | ICD-10-CM

## 2017-08-10 DIAGNOSIS — G894 Chronic pain syndrome: Secondary | ICD-10-CM

## 2017-08-10 DIAGNOSIS — Z716 Tobacco abuse counseling: Secondary | ICD-10-CM

## 2017-08-10 DIAGNOSIS — R0603 Acute respiratory distress: Secondary | ICD-10-CM | POA: Diagnosis not present

## 2017-08-10 DIAGNOSIS — L02818 Cutaneous abscess of other sites: Secondary | ICD-10-CM | POA: Diagnosis present

## 2017-08-10 DIAGNOSIS — R609 Edema, unspecified: Secondary | ICD-10-CM

## 2017-08-10 DIAGNOSIS — G4700 Insomnia, unspecified: Secondary | ICD-10-CM | POA: Diagnosis present

## 2017-08-10 DIAGNOSIS — R5383 Other fatigue: Secondary | ICD-10-CM

## 2017-08-10 DIAGNOSIS — I669 Occlusion and stenosis of unspecified cerebral artery: Secondary | ICD-10-CM | POA: Diagnosis present

## 2017-08-10 DIAGNOSIS — I330 Acute and subacute infective endocarditis: Secondary | ICD-10-CM | POA: Diagnosis present

## 2017-08-10 DIAGNOSIS — Z09 Encounter for follow-up examination after completed treatment for conditions other than malignant neoplasm: Secondary | ICD-10-CM

## 2017-08-10 DIAGNOSIS — G441 Vascular headache, not elsewhere classified: Secondary | ICD-10-CM

## 2017-08-10 DIAGNOSIS — F1010 Alcohol abuse, uncomplicated: Secondary | ICD-10-CM | POA: Diagnosis present

## 2017-08-10 DIAGNOSIS — M4620 Osteomyelitis of vertebra, site unspecified: Secondary | ICD-10-CM | POA: Diagnosis present

## 2017-08-10 DIAGNOSIS — E1169 Type 2 diabetes mellitus with other specified complication: Secondary | ICD-10-CM | POA: Diagnosis present

## 2017-08-10 DIAGNOSIS — I2690 Septic pulmonary embolism without acute cor pulmonale: Secondary | ICD-10-CM

## 2017-08-10 DIAGNOSIS — Z6835 Body mass index (BMI) 35.0-35.9, adult: Secondary | ICD-10-CM

## 2017-08-10 DIAGNOSIS — F17210 Nicotine dependence, cigarettes, uncomplicated: Secondary | ICD-10-CM | POA: Diagnosis present

## 2017-08-10 DIAGNOSIS — Z22322 Carrier or suspected carrier of Methicillin resistant Staphylococcus aureus: Secondary | ICD-10-CM

## 2017-08-10 DIAGNOSIS — R0602 Shortness of breath: Secondary | ICD-10-CM

## 2017-08-10 HISTORY — DX: Osteomyelitis of vertebra, cervical region: M46.22

## 2017-08-10 HISTORY — DX: Osteomyelitis of vertebra, site unspecified: M46.20

## 2017-08-10 LAB — GLUCOSE, CAPILLARY
GLUCOSE-CAPILLARY: 155 mg/dL — AB (ref 65–99)
GLUCOSE-CAPILLARY: 160 mg/dL — AB (ref 65–99)
Glucose-Capillary: 178 mg/dL — ABNORMAL HIGH (ref 65–99)
Glucose-Capillary: 249 mg/dL — ABNORMAL HIGH (ref 65–99)

## 2017-08-10 LAB — CBC
HCT: 24.9 % — ABNORMAL LOW (ref 39.0–52.0)
HEMOGLOBIN: 8.2 g/dL — AB (ref 13.0–17.0)
MCH: 30.5 pg (ref 26.0–34.0)
MCHC: 32.9 g/dL (ref 30.0–36.0)
MCV: 92.6 fL (ref 78.0–100.0)
Platelets: 280 10*3/uL (ref 150–400)
RBC: 2.69 MIL/uL — AB (ref 4.22–5.81)
RDW: 14.5 % (ref 11.5–15.5)
WBC: 5.4 10*3/uL (ref 4.0–10.5)

## 2017-08-10 LAB — BASIC METABOLIC PANEL
Anion gap: 7 (ref 5–15)
BUN: 13 mg/dL (ref 6–20)
CHLORIDE: 95 mmol/L — AB (ref 101–111)
CO2: 28 mmol/L (ref 22–32)
Calcium: 8.5 mg/dL — ABNORMAL LOW (ref 8.9–10.3)
Creatinine, Ser: 0.71 mg/dL (ref 0.61–1.24)
GFR calc Af Amer: >60 mL/min (ref >60)
GFR calc non Af Amer: >60 mL/min (ref >60)
GLUCOSE: 212 mg/dL — AB (ref 65–99)
POTASSIUM: 3.5 mmol/L (ref 3.5–5.1)
Sodium: 130 mmol/L — ABNORMAL LOW (ref 135–145)

## 2017-08-10 MED ORDER — FUROSEMIDE 40 MG PO TABS
40.0000 mg | ORAL_TABLET | Freq: Three times a day (TID) | ORAL | Status: DC
  Administered 2017-08-10 – 2017-08-13 (×8): 40 mg via ORAL
  Filled 2017-08-10 (×8): qty 1

## 2017-08-10 MED ORDER — VITAMIN B-1 100 MG PO TABS
100.0000 mg | ORAL_TABLET | Freq: Every day | ORAL | Status: DC
  Administered 2017-08-11 – 2017-08-19 (×9): 100 mg via ORAL
  Filled 2017-08-10 (×9): qty 1

## 2017-08-10 MED ORDER — INSULIN GLARGINE 100 UNIT/ML ~~LOC~~ SOLN: 12.0000 [IU] | Freq: Every day | SUBCUTANEOUS | 11 refills | Status: DC

## 2017-08-10 MED ORDER — SODIUM CHLORIDE 0.9 % IV SOLN
600.0000 mg | Freq: Two times a day (BID) | INTRAVENOUS | Status: DC
  Administered 2017-08-10 – 2017-08-11 (×2): 600 mg via INTRAVENOUS
  Filled 2017-08-10 (×3): qty 600

## 2017-08-10 MED ORDER — OXYCODONE HCL 5 MG PO TABS: 5.0000 mg | ORAL_TABLET | Freq: Three times a day (TID) | ORAL | 0 refills | Status: DC | PRN

## 2017-08-10 MED ORDER — INSULIN ASPART 100 UNIT/ML ~~LOC~~ SOLN: 0.0000 [IU] | Freq: Three times a day (TID) | SUBCUTANEOUS | 11 refills | Status: DC

## 2017-08-10 MED ORDER — ENSURE ENLIVE PO LIQD: 237.0000 mL | Freq: Two times a day (BID) | ORAL | 12 refills | Status: DC

## 2017-08-10 MED ORDER — ACETAMINOPHEN 325 MG PO TABS: 650.0000 mg | ORAL_TABLET | Freq: Four times a day (QID) | ORAL | Status: DC | PRN

## 2017-08-10 MED ORDER — FOLIC ACID 1 MG PO TABS: 1.0000 mg | ORAL_TABLET | Freq: Every day | ORAL | Status: AC

## 2017-08-10 MED ORDER — POTASSIUM CHLORIDE 20 MEQ/15ML (10%) PO SOLN
20.0000 meq | Freq: Two times a day (BID) | ORAL | Status: DC
  Administered 2017-08-10 – 2017-08-19 (×19): 20 meq via ORAL
  Filled 2017-08-10 (×19): qty 15

## 2017-08-10 MED ORDER — OXYCODONE HCL 5 MG PO TABS
5.0000 mg | ORAL_TABLET | Freq: Once | ORAL | Status: AC
  Administered 2017-08-10: 5 mg via ORAL
  Filled 2017-08-10: qty 1

## 2017-08-10 MED ORDER — SODIUM CHLORIDE 0.9 % IV SOLN
1000.0000 mg | INTRAVENOUS | Status: DC
  Administered 2017-08-11 – 2017-08-19 (×9): 1000 mg via INTRAVENOUS
  Filled 2017-08-10 (×12): qty 20

## 2017-08-10 MED ORDER — INSULIN GLARGINE 100 UNIT/ML ~~LOC~~ SOLN
7.0000 [IU] | Freq: Every day | SUBCUTANEOUS | Status: DC
  Administered 2017-08-11 – 2017-08-12 (×2): 7 [IU] via SUBCUTANEOUS
  Filled 2017-08-10 (×2): qty 0.07

## 2017-08-10 MED ORDER — POTASSIUM CHLORIDE 20 MEQ/15ML (10%) PO SOLN: 20.0000 meq | Freq: Two times a day (BID) | ORAL | 0 refills | Status: DC

## 2017-08-10 MED ORDER — ENSURE ENLIVE PO LIQD
237.0000 mL | Freq: Two times a day (BID) | ORAL | Status: DC
  Administered 2017-08-11 – 2017-08-19 (×17): 237 mL via ORAL

## 2017-08-10 MED ORDER — LIDOCAINE 5 % EX PTCH
1.0000 | MEDICATED_PATCH | CUTANEOUS | Status: DC
  Administered 2017-08-11 – 2017-08-19 (×9): 1 via TRANSDERMAL
  Filled 2017-08-10 (×7): qty 1

## 2017-08-10 MED ORDER — ONDANSETRON HCL 4 MG/2ML IJ SOLN: 4.0000 mg | Freq: Four times a day (QID) | INTRAMUSCULAR | 0 refills | Status: DC | PRN

## 2017-08-10 MED ORDER — IPRATROPIUM-ALBUTEROL 0.5-2.5 (3) MG/3ML IN SOLN
3.0000 mL | RESPIRATORY_TRACT | Status: DC | PRN
Start: 2017-08-10 — End: 2017-08-20
  Administered 2017-08-13 – 2017-08-19 (×7): 3 mL via RESPIRATORY_TRACT
  Filled 2017-08-10 (×9): qty 3

## 2017-08-10 MED ORDER — TRAMADOL HCL 50 MG PO TABS
50.0000 mg | ORAL_TABLET | Freq: Four times a day (QID) | ORAL | Status: DC | PRN
  Administered 2017-08-10 – 2017-08-13 (×7): 50 mg via ORAL
  Filled 2017-08-10 (×8): qty 1

## 2017-08-10 MED ORDER — INSULIN ASPART 100 UNIT/ML ~~LOC~~ SOLN
0.0000 [IU] | Freq: Three times a day (TID) | SUBCUTANEOUS | Status: DC
  Administered 2017-08-10: 3 [IU] via SUBCUTANEOUS
  Administered 2017-08-11: 5 [IU] via SUBCUTANEOUS
  Administered 2017-08-11: 2 [IU] via SUBCUTANEOUS
  Administered 2017-08-11 – 2017-08-12 (×4): 3 [IU] via SUBCUTANEOUS
  Administered 2017-08-13: 2 [IU] via SUBCUTANEOUS
  Administered 2017-08-13 – 2017-08-15 (×4): 3 [IU] via SUBCUTANEOUS
  Administered 2017-08-15: 2 [IU] via SUBCUTANEOUS
  Administered 2017-08-15 – 2017-08-16 (×4): 3 [IU] via SUBCUTANEOUS
  Administered 2017-08-17: 5 [IU] via SUBCUTANEOUS
  Administered 2017-08-17 – 2017-08-18 (×4): 3 [IU] via SUBCUTANEOUS
  Administered 2017-08-18: 5 [IU] via SUBCUTANEOUS
  Administered 2017-08-19: 2 [IU] via SUBCUTANEOUS
  Administered 2017-08-19: 3 [IU] via SUBCUTANEOUS
  Administered 2017-08-19: 2 [IU] via SUBCUTANEOUS

## 2017-08-10 MED ORDER — THIAMINE HCL 100 MG PO TABS: 100.0000 mg | ORAL_TABLET | Freq: Every day | ORAL | Status: DC

## 2017-08-10 MED ORDER — FUROSEMIDE 40 MG PO TABS: 40.0000 mg | ORAL_TABLET | Freq: Three times a day (TID) | ORAL | 11 refills | Status: DC

## 2017-08-10 MED ORDER — FOLIC ACID 1 MG PO TABS
1.0000 mg | ORAL_TABLET | Freq: Every day | ORAL | Status: DC
  Administered 2017-08-11 – 2017-08-17 (×7): 1 mg via ORAL
  Filled 2017-08-10 (×7): qty 1

## 2017-08-10 MED ORDER — SODIUM CHLORIDE 0.9 % IV SOLN: 1000.0000 mg | INTRAVENOUS | Status: DC

## 2017-08-10 MED ORDER — ACETAMINOPHEN 325 MG PO TABS
650.0000 mg | ORAL_TABLET | Freq: Four times a day (QID) | ORAL | Status: DC | PRN
  Administered 2017-08-14 – 2017-08-18 (×10): 650 mg via ORAL
  Filled 2017-08-10 (×10): qty 2

## 2017-08-10 MED ORDER — METOPROLOL TARTRATE 25 MG PO TABS: 25.0000 mg | ORAL_TABLET | Freq: Two times a day (BID) | ORAL | Status: DC

## 2017-08-10 MED ORDER — SODIUM CHLORIDE 0.9 % IV SOLN
250.0000 mL | INTRAVENOUS | Status: DC | PRN
  Administered 2017-08-11 – 2017-08-12 (×2): 250 mL via INTRAVENOUS

## 2017-08-10 MED ORDER — METOPROLOL TARTRATE 25 MG PO TABS
25.0000 mg | ORAL_TABLET | Freq: Two times a day (BID) | ORAL | Status: DC
  Administered 2017-08-10 – 2017-08-19 (×18): 25 mg via ORAL
  Filled 2017-08-10 (×18): qty 1

## 2017-08-10 MED ORDER — INSULIN ASPART 100 UNIT/ML ~~LOC~~ SOLN: 0.0000 [IU] | Freq: Every day | SUBCUTANEOUS | 11 refills | Status: DC

## 2017-08-10 MED ORDER — CHLORHEXIDINE GLUCONATE 0.12 % MT SOLN
15.0000 mL | Freq: Two times a day (BID) | OROMUCOSAL | Status: DC
  Administered 2017-08-10 – 2017-08-19 (×19): 15 mL via OROMUCOSAL
  Filled 2017-08-10 (×20): qty 15

## 2017-08-10 NOTE — Progress Notes (Signed)
Occupational Therapy Treatment Patient Details Name: Reginald [name] MRN: [id number] DOB: 01/22/1961 Today's Date: 08/10/2017    History of present illness [facility] with MRSA endocarditis of mitral valve with moderate-severe MR. Pt developed agitation on 12/5 thought to be ETOH withdrawal. MRI on 12/5 done and showed multiple areas bilaterally of septic emboli. Pt with severe encephalopathy and respiratory failure and was intubated on 12/6. Pt extubated on 12/10. PMH - DM, ETOH, obesity. [facility]. Underwent dental extraction 08/07/17 in preparation for mitral valve replacement.   OT comments  Pt making steady progress.  Requires min A for functional transfers.  Session cut short due to procedure.   Recommend CIR.   Follow Up Recommendations  CIR    Equipment Recommendations  None recommended by OT    Recommendations for Other Services      Precautions / Restrictions Precautions Precautions: Fall Restrictions Weight Bearing Restrictions: No       Mobility Bed Mobility Overal bed mobility: Needs Assistance Bed Mobility: Sit to Supine Rolling: Min guard Sidelying to sit: Min assist   Sit to supine: Min guard   General bed mobility comments: Min assist to elevate trunk into sitting edge of bed.  Pt able to follow commands better to roll and elevate trunk into sitting edge of bed.    Transfers Overall transfer level: Needs assistance Equipment used: Rolling walker (2 wheeled) Transfers: Sit to/from [employer]GI CorporationStand;Stand Pivot Transfers Sit to Stand: Min guard Stand pivot transfers: Min assist       General transfer comment: verbal cues for hand placement.  Min A to steady.       Balance Overall balance assessment: Needs assistance   Sitting balance-Leahy Scale: Good     Standing balance support: Bilateral upper extremity supported Standing balance-Leahy Scale: Poor Standing balance comment: Pt  able to stand and use urinal with min assistance.                             ADL either performed or assessed with clinical judgement   ADL                           Toilet Transfer: Minimal assistance;Stand-pivot;RW   Toileting- Clothing Manipulation and Hygiene: Minimal assistance;Sit to/from stand               Vision       Perception     Praxis      Cognition Arousal/Alertness: Awake/alert Behavior During Therapy: [facility] for tasks assessed/performed Overall Cognitive Status: Impaired/Different from baseline Area of Impairment: Attention;Safety/judgement;Following commands;Problem solving                   Current Attention Level: Selective     Safety/Judgement: Decreased awareness of safety;Decreased awareness of deficits              Exercises     Shoulder Instructions       General Comments ultrasound tech arrived to perform doppler - assisted pt back to bed     Pertinent Vitals/ Pain       Pain Assessment: Faces Pain Score: 8  Faces Pain Scale: No hurt Pain Location: neck Pain Descriptors / Indicators: Aching Pain Intervention(s): Monitored during session;Repositioned;Patient requesting pain meds-RN notified  Home Living  Prior Functioning/Environment              Frequency  Min 2X/week        Progress Toward Goals  OT Goals(current goals can now be found in the care plan section)  Progress towards OT goals: Progressing toward goals  Acute Rehab OT Goals Patient Stated Goal: go home  Plan Discharge plan remains appropriate    Co-evaluation                 AM-PAC PT "6 Clicks" Daily Activity     Outcome Measure   Help from another person eating meals?: None Help from another person taking care of personal grooming?: A Little Help from another person toileting, which includes using toliet, bedpan, or urinal?: A Little Help from  another person bathing (including washing, rinsing, drying)?: A Lot Help from another person to put on and taking off regular upper body clothing?: A Little Help from another person to put on and taking off regular lower body clothing?: A Lot 6 Click Score: 17    End of Session Equipment Utilized During Treatment: Rolling walker  OT Visit Diagnosis: Unsteadiness on feet (R26.81);Muscle weakness (generalized) (M62.81);Other symptoms and signs involving cognitive function;Pain   Activity Tolerance Patient tolerated treatment well   Patient Left in bed;with call bell/phone within reach   Nurse Communication          Time: [id number] OT Time Calculation (min): 11 min  Charges: OT General Charges $OT Visit: 1 Visit OT Treatments $Therapeutic Activity: 8-22 mins  Reynolds AmericanWendi Atheena Spano, OTR/L [phone]    Jeani HawkingConarpe, Simrit Gohlke M 08/10/2017, 6:43 PM

## 2017-08-10 NOTE — IPOC Note (Signed)
Overall Plan of Care [facility](IPOC) Patient Details Name: Reginald [name] MRN: [id number] DOB: 06/04/1961  Admitting Diagnosis: Debility  Hospital Problems: Principal Problem:   Debility Active Problems:   MRSA bacteremia   Mitral valve vegetation   Alcohol abuse   Endocarditis of mitral valve   Cerebral septic emboli (HCC)   Septic embolism (HCC)   Acute osteomyelitis of cervical spine (HCC)   Paraspinal abscess (HCC)   Acute blood loss anemia   Hyponatremia   Essential hypertension   Type 2 diabetes mellitus with peripheral neuropathy (HCC)   Chronic pain syndrome     Functional Problem List: Nursing Behavior, Endurance, Medication Management, Motor, Pain, Perception, Safety, Sensory, Skin Integrity  PT Balance, Endurance  OT Balance, Cognition, Endurance, Pain, Safety  SLP Cognition  TR         Basic ADL's: OT Grooming, Bathing, Dressing, Toileting     Advanced  ADL's: OT       Transfers: PT Bed Mobility, Bed to Chair, Car, Occupational psychologisturniture  OT Toilet, Research scientist (life sciences)Tub/Shower     Locomotion: PT Stairs, Psychologist, prison and probation servicesWheelchair Mobility, Ambulation     Additional Impairments: OT    SLP Social Cognition   Problem Solving, Memory, Attention, Awareness  TR      Anticipated Outcomes Item Anticipated Outcome  Self Feeding no goal (I)  Swallowing      Basic self-care  MOD I  Toileting  MOD I toileting; S shower   Bathroom Transfers MOD I toileting; S shower  Bowel/Bladder  mod I  Transfers  mod I   Locomotion  mod I short distances ambulatory with LRAD  Communication     Cognition  Mod I  Pain  less than 2  Safety/Judgment  Mod I   Therapy Plan: PT Intensity: Minimum of 1-2 x/day ,45 to 90 minutes PT Frequency: 5 out of 7 days PT Duration Estimated Length of Stay: 7-10 OT Intensity: Minimum of 1-2 x/day, 45 to 90 minutes OT Frequency: 5 out of 7 days OT Duration/Estimated Length of Stay: 7-10 SLP Intensity: Minumum of 1-2 x/day, 30 to 90 minutes SLP Frequency: 3 to 5 out of  7 days SLP Duration/Estimated Length of Stay: 7-10 days     Team Interventions: Nursing Interventions Pain Management, Patient/Family Education, Skin Care/Wound Management, Psychosocial Support, Disease Management/Prevention, Medication Management, Cognitive Remediation/Compensation, Discharge Planning  PT interventions Ambulation/gait training, Discharge planning, Functional mobility training, Psychosocial support, Therapeutic Activities, Visual/perceptual remediation/compensation, Wheelchair propulsion/positioning, Therapeutic Exercise, Warden/rangerBalance/vestibular training, Cognitive remediation/compensation, DME/adaptive equipment instruction, Pain management, Splinting/orthotics, UE/LE Strength taining/ROM, Community reintegration, Development worker, international aidunctional electrical stimulation, Equities traderatient/family education, Museum/gallery curatortair training, UE/LE Coordination activities  OT Interventions Warden/rangerBalance/vestibular training, Community reintegration, Disease mangement/prevention, Development worker, international aidunctional electrical stimulation, Neuromuscular re-education, Patient/family education, Self Care/advanced ADL retraining, Therapeutic Exercise, UE/LE Coordination activities, Wheelchair propulsion/positioning, Cognitive remediation/compensation, Discharge planning, DME/adaptive equipment instruction, Functional mobility training, Pain management, Psychosocial support, Skin care/wound managment, Therapeutic Activities, UE/LE Strength taining/ROM  SLP Interventions Cognitive remediation/compensation, Cueing hierarchy, Environmental controls, Internal/external aids  TR Interventions    SW/CM Interventions Discharge Planning, Psychosocial Support, Patient/Family Education   Barriers to Discharge MD  Medical stability and Pending surgery  Nursing      PT Decreased caregiver support, Medical stability unsure of girlfriend's plan to provide supervision at d/c, she was not present to confirm   OT      SLP Decreased caregiver support, Inaccessible home environment, Home  environment access/layout, Lack of/limited family support    SW Decreased caregiver support, Medication compliance, Home environment access/layout Doesn';t have 24 hr care, needs to be  mod/i prior to discharge home   Team Discharge Planning: Destination: PT-(acute care) ,OT- Home , SLP-Home Projected Follow-up: PT-Other (comment)(plan for d/c back to acute for surgery, should re-consult acute PT at that time), OT-  Home health OT, SLP-Other (comment)(TBD) Projected Equipment Needs: PT-To be determined, OT- To be determined, Tub/shower seat, SLP-None recommended by SLP Equipment Details: PT- , OT-pt reports using walk in shower wiht no seat Patient/family involved in discharge planning: PT- Patient,  OT-Patient, SLP-Patient  MD ELOS: Mod I Medical Rehab Prognosis:  Good Assessment:  56 y.o.malewith history of diastolic CHF, COPD, T2DM, alcohol abuse who [facility] and found to have MRSA bacteremia with endocarditis. Hospital course significant for acute on chronic CHF and alcohol withdrawal with confusion with agitation. He was found to have 4.5 cm AAA as well as cervical DDD. [facility]. MRI brain done revealing Multiple small areas of restricted diffusion bilaterally most compatible with septic emboli. Dr. Drue SecondSnider consulted for input and recommended continuing antibiotics as well as TEE for work up. TEE done revealing large mass on anterior mitral leaflet with central regurgitation jet suggestive for possible perforation as well as possible small mass on AV (could be due to resolved endocarditis). He had issues with agitation requiring precedex but developed obtundation requiring intubation for airway protection on 12/6 and extubated 07/31/2017. Repeat BC 12/7 and 12/10 positive and CT abdomen/chest showed splenic emboli and possible PNA. Rifampin added to ceftaroline/daptomycin on 12/12. Neurology consulted for  input and recommended CIWA protocol and treating infection--no ASA needed. CTA head was negative for large or medium vessel stenosis or occlusion. Cardiology following to help manage CHF and he underwent diagnosticcardiac cath 08/01/2017. MRSA PCR screening positive and continues on contact precautions.  Underwent multiple dental extraction of necrotic teeth 08/07/2017 in preparation for mitral valve replacement with bioprosthetic valve which had initially been scheduled for 08/14/2017 but surgeon raised concerns about patient's degree of conditioning and at this time advised to hold on surgical procedure until patient was able to tolerate. Follow-up per infectious disease currently is rifampin was discontinued 08/09/2017 and continue with daptomycin and teflaro 6 weeks total and latest blood culture showed no growth. Patient with resulting functional deficits with mobility, ednurance, self-care.  Will set goals for Mod I with PT/OT.   See Team Conference Notes for weekly updates to the plan of care

## 2017-08-10 NOTE — H&P (Signed)
Physical Medicine and Rehabilitation Admission H&P    Chief complaint: Weakness  HPI: Reginald [name] is a 56 y.o. 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 Hospital course significant for acute on chronic CHF and alcohol withdrawal with  confusion with agitation.  He was found to have 4.5 cm AAA as well as cervical DDD.  He was transferred to [facility] for evaluation and treatment. MRI brain done revealing  Multiple small areas of restricted diffusion bilaterally most compatible with septic emboli. Dr. Baxter Flattery consulted for input and recommended continuing antibiotics as well as TEE for work up.   TEE done revealing large mass on anterior mitral leaflet with central regurgitation jet suggestive for possible perforation as well as possible small mass on AV (could be due to resolved endocarditis).   He had issues with agitation requiring precedex but developed obtundation requiring intubation for airway protection on 12/6 and extubated 07/31/2017. Repeat BC 12/7 and 12/10 positive and CT abdomen/chest showed splenic emboli and possible PNA.   Rifampin added to ceftaroline/daptomycin on 12/12.   Neurology consulted for input and recommended CIWA protocol and treating infection--no ASA needed. CTA head was negative for large or medium vessel stenosis or occlusion. Cardiology following to help manage CHF and he underwent diagnostic cardiac cath  08/01/2017 .  MRSA PCR screening positive and continues on contact precautions.   Underwent multiple dental extraction of necrotic teeth 08/07/2017 in preparation for mitral valve replacement with bioprosthetic valve which had initially been scheduled for 08/14/2017 but surgeon raises valid concerns about patient's degree of conditioning and at this time advised to hold on surgical procedure until patient was able to tolerate... Follow-up per  infectious disease currently is rifampin was discontinued 08/09/2017 and continue with daptomycin and teflaro 6 weeks total and latest blood culture showed no growth. Physical occupational therapy ongoing patient was admitted for a comprehensive rehabilitation program    Review of Systems  Constitutional: Negative for chills and fever.  HENT: Negative for hearing loss.   Eyes: Negative for blurred vision and double vision.  Respiratory: Positive for shortness of breath.   Cardiovascular: Positive for leg swelling. Negative for chest pain and palpitations.  Gastrointestinal: Positive for constipation. Negative for nausea.  Genitourinary: Negative for dysuria and hematuria.  Musculoskeletal: Positive for myalgias.  Neurological: Positive for dizziness, weakness and headaches. Negative for seizures.  All other systems reviewed and are negative.  Past Medical History:  Diagnosis Date  . Alcohol abuse   . Diabetes mellitus type 2 in obese ([place])   . Neck pain   . Tobacco abuse    Past Surgical History:  Procedure Laterality Date  . HERNIA REPAIR  2007  . MULTIPLE EXTRACTIONS WITH ALVEOLOPLASTY N/A 08/07/2017   Procedure: Extraction of tooth #'s 6,11,12,14,19-29 and 32 with alveoloplasty;  Surgeon: Lenn Cal, DDS;  Location: [place];  Service: Oral Surgery;  Laterality: N/A;  . RIGHT/LEFT HEART CATH AND CORONARY ANGIOGRAPHY N/A 08/02/2017   Procedure: RIGHT/LEFT HEART CATH AND CORONARY ANGIOGRAPHY;  Surgeon: Burnell Blanks, MD;  Location: [place] [facility];  Service: Cardiovascular;  Laterality: N/A;  . Tonsillectomy /adnoidectomy     as aa child   Family History  Problem Relation Age of Onset  . Hypertension Mother   . Hypertension Father    Social History:  reports that he has been smoking cigarettes.  he has never used  smokeless tobacco. He reports that he drinks about 2.4 - 3.0 oz of alcohol per week. He reports that he does not use drugs. Allergies:  Allergies    Allergen Reactions  . Morphine And Related    Medications Prior to Admission  Medication Sig Dispense Refill  . amLODipine (NORVASC) 10 MG tablet Take 10 mg by mouth daily.  0  . diazepam (VALIUM) 2 MG tablet Take 2 mg by mouth 2 (two) times daily.  1  . gabapentin (NEURONTIN) 300 MG capsule Take 300 mg by mouth 2 (two) times daily.  1  . glipiZIDE (GLUCOTROL) 10 MG tablet Take 10 mg by mouth daily.  2  . hydrochlorothiazide (HYDRODIURIL) 25 MG tablet Take 25 mg by mouth daily.  3  . lisinopril (PRINIVIL,ZESTRIL) 20 MG tablet Take 20 mg by mouth daily.  3  . metFORMIN (GLUCOPHAGE) 500 MG tablet Take 500 mg by mouth 2 (two) times daily.  2  . naproxen sodium (ALEVE) 220 MG tablet Take 220-440 mg by mouth 2 (two) times daily as needed (pain).    . sildenafil (REVATIO) 20 MG tablet Take 50 mg by mouth as directed. Take '50mg'$  by mouth 1 hour before intercourse  0    Drug Regimen Review Drug regimen was reviewed and remains appropriate with no significant issues identified  Home: Home Living Family/patient expects to be discharged to:: Private residence Living Arrangements: Spouse/significant other Available Help at Discharge: Family Type of Home: House Home Access: Level entry Home Layout: One level Bathroom Shower/Tub: Tub/shower unit, Multimedia programmer: Standard Home Equipment: None   Functional History: Prior Function Level of Independence: Independent  Functional Status:  Mobility: Bed Mobility Overal bed mobility: Needs Assistance Bed Mobility: Supine to Sit Supine to sit: Min assist Sit to supine: +2 for physical assistance, Min assist General bed mobility comments: min assist to bring trunk up to sit EOB  Transfers Overall transfer level: Needs assistance Equipment used: Rolling walker (2 wheeled) Transfers: Sit to/from Stand Sit to Stand: Min assist General transfer comment: VC for hand placement and safety  Ambulation/Gait Ambulation/Gait  assistance: Min guard Ambulation Distance (Feet): 10 Feet Assistive device: Rolling walker (2 wheeled) Gait Pattern/deviations: Step-through pattern, Decreased step length - right, Decreased step length - left, Shuffle, Trunk flexed General Gait Details: min guard, +2 for equipment and chair follow  Gait velocity: decr Gait velocity interpretation: Below normal speed for age/gender    ADL: ADL Overall ADL's : Needs assistance/impaired Grooming: Wash/dry hands, Wash/dry face, Standing, Minimal assistance Grooming Details (indicate cue type and reason): Poor balance, unsteady Upper Body Bathing: Min guard, Sitting Lower Body Bathing: Maximal assistance Upper Body Dressing : Min guard, Sitting Lower Body Dressing: Maximal assistance Toilet Transfer: Ambulation, RW, Comfort height toilet, Moderate assistance, +2 for safety/equipment, Cueing for safety Toileting- Clothing Manipulation and Hygiene: Moderate assistance, Sit to/from stand Tub/ Shower Transfer: Moderate assistance, +2 for safety/equipment, Rolling walker, Ambulation, 3 in 1, Grab bars Functional mobility during ADLs: Moderate assistance, +2 for safety/equipment, Rolling walker, Cueing for safety General ADL Comments: verbal cues for correct hand placement, pt encouraged to sit up in recliner vs returning to bed as he requested  Cognition: Cognition Overall Cognitive Status: Impaired/Different from baseline Orientation Level: Oriented X4 Cognition Arousal/Alertness: Awake/alert Behavior During Therapy: WFL for tasks assessed/performed Overall Cognitive Status: Impaired/Different from baseline Area of Impairment: Attention, Safety/judgement, Following commands, Problem solving Current Attention Level: Selective Memory: Decreased short-term memory Following Commands: Follows one step commands consistently, Follows one step  commands with increased time Safety/Judgement: Decreased awareness of safety, Decreased awareness of  deficits Problem Solving: Slow processing, Difficulty sequencing, Requires verbal cues, Requires tactile cues General Comments: Some increased time for processing and decreased attention this session.   Physical Exam: Blood pressure 115/69, pulse 93, temperature 99 F (37.2 C), temperature source Oral, resp. rate 18, height _0  (1.753 m), weight 107.7 kg (237 lb 6.4 oz), SpO2 90 %. Physical Exam  Constitutional:  56 year old right-handed obese male  HENT:  Head: Normocephalic.  Multiple tooth extractions  Neck: Normal range of motion. Neck supple. No thyromegaly present.  Cardiovascular: Regular rhythm.  Murmur heard. Cardiac rate controlled  Respiratory: Effort normal and breath sounds normal. No respiratory distress. He has no wheezes. He has no rales.  GI: Soft. Bowel sounds are normal. He exhibits no distension.  Skin. Warm and dry Musculoskeletal: He exhibits no edema or tenderness.  Neurological: He is alert and oriented to person, place, and time.  Speech soft and slow. Slow to process. Able to answer orientation questions with minimal cues. He was able to follow simple motor commands without difficulty. Diffuse weakness. Strength grossly 4/5 prox to distal in UE and 3 to 3+HF to 4-KE, 4/5 ADF/PF in LE's Psych: flat but cooperative     Results for orders placed or performed during the hospital encounter of 07/25/17 (from the past 48 hour(s))  Glucose, capillary     Status: Abnormal   Collection Time: 08/08/17  4:55 PM  Result Value Ref Range   Glucose-Capillary 139 (H) 65 - 99 mg/dL   Comment 1 Notify RN    Comment 2 Document in Chart   Glucose, capillary     Status: Abnormal   Collection Time: 08/08/17  8:59 PM  Result Value Ref Range   Glucose-Capillary 205 (H) 65 - 99 mg/dL  Basic metabolic panel     Status: Abnormal   Collection Time: 08/09/17  5:38 AM  Result Value Ref Range   Sodium 132 (L) 135 - 145 mmol/L   Potassium 4.2 3.5 - 5.1 mmol/L    Comment: NO  VISIBLE HEMOLYSIS   Chloride 95 (L) 101 - 111 mmol/L   CO2 28 22 - 32 mmol/L   Glucose, Bld 169 (H) 65 - 99 mg/dL   BUN 16 6 - 20 mg/dL   Creatinine, Ser 0.84 0.61 - 1.24 mg/dL   Calcium 8.6 (L) 8.9 - 10.3 mg/dL   GFR calc non Af Amer >60 >60 mL/min   GFR calc Af Amer >60 >60 mL/min    Comment: (NOTE) The eGFR has been calculated using the CKD EPI equation. This calculation has not been validated in all clinical situations. eGFR's persistently <60 mL/min signify possible Chronic Kidney Disease.    Anion gap 9 5 - 15  CBC     Status: Abnormal   Collection Time: 08/09/17  5:38 AM  Result Value Ref Range   WBC 6.5 4.0 - 10.5 K/uL   RBC 2.84 (L) 4.22 - 5.81 MIL/uL   Hemoglobin 8.6 (L) 13.0 - 17.0 g/dL   HCT 26.4 (L) 39.0 - 52.0 %   MCV 93.0 78.0 - 100.0 fL   MCH 30.3 26.0 - 34.0 pg   MCHC 32.6 30.0 - 36.0 g/dL   RDW 13.8 11.5 - 15.5 %   Platelets 294 150 - 400 K/uL  Glucose, capillary     Status: Abnormal   Collection Time: 08/09/17  6:05 AM  Result Value Ref Range   Glucose-Capillary 197 (H)  65 - 99 mg/dL  Glucose, capillary     Status: Abnormal   Collection Time: 08/09/17 11:15 AM  Result Value Ref Range   Glucose-Capillary 153 (H) 65 - 99 mg/dL  Glucose, capillary     Status: Abnormal   Collection Time: 08/09/17  4:16 PM  Result Value Ref Range   Glucose-Capillary 286 (H) 65 - 99 mg/dL  Glucose, capillary     Status: Abnormal   Collection Time: 08/09/17  9:09 PM  Result Value Ref Range   Glucose-Capillary 185 (H) 65 - 99 mg/dL  CBC     Status: Abnormal   Collection Time: 08/10/17  4:13 AM  Result Value Ref Range   WBC 5.4 4.0 - 10.5 K/uL   RBC 2.69 (L) 4.22 - 5.81 MIL/uL   Hemoglobin 8.2 (L) 13.0 - 17.0 g/dL   HCT 24.9 (L) 39.0 - 52.0 %   MCV 92.6 78.0 - 100.0 fL   MCH 30.5 26.0 - 34.0 pg   MCHC 32.9 30.0 - 36.0 g/dL   RDW 14.5 11.5 - 15.5 %   Platelets 280 150 - 400 K/uL  Basic metabolic panel     Status: Abnormal   Collection Time: 08/10/17  4:13 AM  Result  Value Ref Range   Sodium 130 (L) 135 - 145 mmol/L   Potassium 3.5 3.5 - 5.1 mmol/L   Chloride 95 (L) 101 - 111 mmol/L   CO2 28 22 - 32 mmol/L   Glucose, Bld 212 (H) 65 - 99 mg/dL   BUN 13 6 - 20 mg/dL   Creatinine, Ser 0.71 0.61 - 1.24 mg/dL   Calcium 8.5 (L) 8.9 - 10.3 mg/dL   GFR calc non Af Amer >60 >60 mL/min   GFR calc Af Amer >60 >60 mL/min    Comment: (NOTE) The eGFR has been calculated using the CKD EPI equation. This calculation has not been validated in all clinical situations. eGFR's persistently <60 mL/min signify possible Chronic Kidney Disease.    Anion gap 7 5 - 15  Glucose, capillary     Status: Abnormal   Collection Time: 08/10/17  6:14 AM  Result Value Ref Range   Glucose-Capillary 178 (H) 65 - 99 mg/dL  Glucose, capillary     Status: Abnormal   Collection Time: 08/10/17 11:44 AM  Result Value Ref Range   Glucose-Capillary 249 (H) 65 - 99 mg/dL   Comment 1 Notify RN    Mr Cervical Spine W Wo Contrast  Addendum Date: 08/08/2017   ADDENDUM REPORT: 08/08/2017 15:17 ADDENDUM: These results were called by telephone at the time of interpretation on 08/08/2017 at 3:15 pm to the patient's nurse, Luetta Nutting,, who verbally acknowledged these results. Electronically Signed   By: Inge Rise M.D.   On: 08/08/2017 15:17   Result Date: 08/08/2017 CLINICAL DATA:  Neck pain and patient with bacterial endocarditis. Question infection. EXAM: MRI CERVICAL SPINE WITHOUT AND WITH CONTRAST TECHNIQUE: Multiplanar and multiecho pulse sequences of the cervical spine, to include the craniocervical junction and cervicothoracic junction, were obtained without and with intravenous contrast. CONTRAST:  20 ml MULTIHANCE GADOBENATE DIMEGLUMINE 529 MG/ML IV SOLN COMPARISON:  CT angiogram of the neck 07/28/2017. FINDINGS: Alignment: There is trace retrolisthesis of C4 on C5. Alignment is otherwise maintained. Vertebrae: Abnormal edema and enhancement are seen in the C4-5 endplates consistent  with discitis and osteomyelitis. No epidural abscess is identified. The patient also has abnormal edema and enhancement in the odontoid process and in the anterior arch of C1.  Marrow edema and enhancement are also identified at the articulation of the occipital condyles and lateral masses of C1 which appears worse on the right where there is an associated small effusion highly suspicious for septic joint. Cord: Normal signal throughout. Posterior Fossa, vertebral arteries, paraspinal tissues: There is a small fluid collection along the right lateral mass of C2 measuring approximately 1.8 cm AP by 0.3 cm transverse by 0.9 cm craniocaudal worrisome for abscess. Edema and enhancement are seen and posterior paraspinous musculature, particularly the semispinalis muscle on the right where there is an abscess measuring 1.2 cm AP by 0.3 cm transverse at the level of the C2-3 disc interspace. This abscess likely communicates with the abscess described above. Although no epidural abscess is identified, there is epidural enhancement posterior to C2 and at C4-5 and extensive prevertebral soft tissue edema and enhancement. Disc levels: Patient motion somewhat limits evaluation of the disc interspaces. C1-2:  The central canal is open. C2-3:  Mild disc bulge without stenosis. C3-4:  Minimal disc bulge without stenosis. C4-5: Shallow disc bulge effaces the ventral thecal sac. Uncovertebral disease causes mild to moderate foraminal narrowing, worse on the left. C5-6: There is a shallow disc bulge, uncovertebral disease and facet arthropathy. The ventral thecal sac appears effaced. Severe left and moderate right foraminal narrowing is identified. C6-7: Shallow disc bulge without central canal stenosis. Mild to moderate left foraminal narrowing is seen. The right foramen is open. C7-T1:  Negative. IMPRESSION: Findings consistent with osteomyelitis in the odontoid process of C2 and anterior arch of C1. Negative for epidural abscess.  Marrow edema and enhancement about the articulation of the occipital condyles and lateral masses of C1, worse on the right, consistent osteomyelitis and septic joint. As described above, there is a small abscess along the right lateral mass of C2 in the paraspinous soft tissues. Findings consistent with discitis and osteomyelitis at C4-5. Negative for epidural abscess. Edema and enhancement in paraspinous musculature consistent with myositis. Changes are worse on the right where there is a small abscess in semispinalis muscle at the level of the C2-3 disc interspace. This abscess likely communicates with the abscess along the right lateral mass of C2 described above. Electronically Signed: By: Inge Rise M.D. On: 08/08/2017 15:11       Medical Problem List and Plan: 1.  Debility secondary to septic emboli to the brain  -Admit to inpatient rehab 2.  DVT Prophylaxis/Anticoagulation: SCDs. Monitor for any signs of DVT 3. Pain Management: Lidoderm patch, Tylenol as needed 4. Mood: Provide emotional support 5. Neuropsych: This patient is capable of making decisions on his own behalf. 6. Skin/Wound Care: Routine skin checks 7. Fluids/Electrolytes/Nutrition: Routine I&O's with follow-up chemistries 8. 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 Intravenous Teflaro 600 mg every 12 hours, daptomycin 1000 mg daily 6 weeks total. Confirmed duration with infectious disease 9. Mitral regurgitation. Plan mitral valve replacement after conditioning and rehabilitation program completed per Dr. Lucianne Lei Tright 10. Multiple dental extractions 08/07/2017 11. Diabetes mellitus peripheral neuropathy. Lantus insulin 7 units daily. Check blood sugars before meals and at bedtime. Provide diabetic teaching 12. Diastolic congestive heart failure. Lasix 40 mg every 8 hours monitor for any signs of fluid overload 13. COPD/tobacco abuse. Counseling 14. Alcohol abuse. Monitor for any signs of  withdrawal 15. Hypertension. Lopressor 25 mg twice a day 16. MRSA PCR screening positive. Contact precautions  Post Admission Physician Evaluation: 1. Functional deficits secondary  to septic emboli to the brain. 2. Patient  is admitted to receive collaborative, interdisciplinary care between the physiatrist, rehab nursing staff, and therapy team. 3. Patient's level of medical complexity and substantial therapy needs in context of that medical necessity cannot be provided at a lesser intensity of care such as a SNF. 4. Patient has experienced substantial functional loss from his/her baseline which was documented above under the "Functional History" and "Functional Status" headings.  Judging by the patient's diagnosis, physical exam, and functional history, the patient has potential for functional progress which will result in measurable gains while on inpatient rehab.  These gains will be of substantial and practical use upon discharge  in facilitating mobility and self-care at the household level. 5. Physiatrist will provide 24 hour management of medical needs as well as oversight of the therapy plan/treatment and provide guidance as appropriate regarding the interaction of the two. 6. The Preadmission Screening has been reviewed and patient status is unchanged unless otherwise stated above. 7. 24 hour rehab nursing will assist with bladder management, bowel management, safety, skin/wound care, disease management, medication administration, pain management and patient education  and help integrate therapy concepts, techniques,education, etc. 8. PT will assess and treat for/with: Lower extremity strength, range of motion, stamina, balance, functional mobility, safety, adaptive techniques and equipment, patient and family education, community reentry.   Goals are: Modified independent to supervision. 9. OT will assess and treat for/with: ADL's, functional mobility, safety, upper extremity strength,  adaptive techniques and equipment, family and patient education, community reentry.   Goals are: Modified independent supervision. Therapy may proceed with showering this patient. 10. SLP will assess and treat for/with: Cognition and communication, swallowing.  Goals are: Modified independent supervision. 11. Case Management and Social Worker will assess and treat for psychological issues and discharge planning. 12. Team conference will be held weekly to assess progress toward goals and to determine barriers to discharge. 13. Patient will receive at least 3 hours of therapy per day at least 5 days per week. 14. ELOS: 12-17 days       15. Prognosis:  excellent     Meredith Staggers, MD, [place] [facility] 08/10/2017  Lavon Paganini [place], PA-C 08/10/2017

## 2017-08-10 NOTE — Progress Notes (Signed)
Rehab admissions - I was asked to re-consult for acute inpatient rehab admission.  I spoke with Dr. Maryfrances Bunnellanford who says that Dr. Morton PetersVan Tright will not be doing surgery until patient is stronger.  I will come speak with patient about potential acute inpatient rehab admission.  Call me for questions.  #[phone]

## 2017-08-10 NOTE — Discharge Summary (Signed)
Physician Discharge Summary  Risco ZOX:[id number] DOB: [birth year] DOA: 07/25/2017  PCP: Patient, No Pcp Per  Admit date: 07/25/2017 Discharge date: 08/10/2017  Admitted From: Home  Disposition:  CIR   Recommendations for Outpatient Follow-up:  1. Follow up with Dr. Donata Clay while in rehab, to re-evaluate for mitral valve repair 2. Please obtain CK and CBC weekly while on daptomycin 3. Please obtain MRI c-spine before surgery, to re-image cervical spine osteomyelitis 4. Consider consultation with palliative care pre-operatively   Home Health: N/A    Discharge Condition: Poor  CODE STATUS: FULL Diet recommendation: DYS 3, thin liquids         Brief/Interim Summary: This is a 56 yo M with alcohol use d/o, HFpEF, COPD, and DM who presented to [facility] on 11/29 after developing vague malaise, found to have sepsis with MRSA bacteremia.  While at [place], he had persistently positive blood cultures and so was transferred to [facility].   MRSA bacteremia  MV endocarditis, large vegetation Possible AV endocarditis CNS, septic emboli to brain Possible multifocal pneumonia on CT C1 vertebral osteo, C4 discitis, paraspinal abscess TEE showed 2cm mobile vegetation, destruction of the mitral valve, possible aortic vegetation.  CT chest showed multifocal pneumonia.  MRI brain showed scattered septic emboli.  MR spine showed vertebral osteomyelitis, septic occipto-vertebral joint and paraspinal abscess.    ID were consulted, due to persistent MRSA bacteremia despite supratherapeutic vancomycin levels, he was placed on Ceftaroline and Daptomycin and blood cultures cleared.  Cardiology were consulted and hte patient was diuresed with IV Lasix, net negative 14L on admission.  On the day of discharge, his Lasix was converted to oral form, three times daily.  CVTS were consulted and initially planned mitral valve repair on 12/24.  However, due to patient's poor exercise  tolerance, this was post-poned for at least a week for intensive inpatient rehab prior to surgery.   Vertebral osteomyelitis This was noted on MRI C-spine ordered by ID.  There was osteomyelitis of the odontoid process.  Case was discussed with Neurosurgery who reviewed films, recommended repeat MRI C-spine in 4-6 weeks (Jan 10-20th roughly). -However, in light of neck hyperextension for anesthesia, would recommend repeat MRI imaging before surgery  Alcohol delirium While at [facility] for the first 5 days, the patient developed alcohol withdrawal and delirium and was placed on Precedex.  This was titrated off by transfer to [facility], but after arrival to [facility], he again became delirious, required intubation and Precedex again.  Since second extubation, he has been stable off Precedex.  Acute on chronic diastolic CHF Hypertension Severe mitral regurgitation Diuresed with Cardiology guidance.  Appeared euvolemic at discharge.  Transition to oral Lasix.  Diabetes Home metformin and glipizide were stopped.  Transitioned to Glargine, SSI    Discharge Diagnoses:  Principal Problem:   Aortic valve endocarditis Active Problems:   MRSA bacteremia   Mitral valve vegetation   Acute respiratory failure (HCC)   Alcohol abuse   Thrombocytopenia (HCC)   Abnormal liver enzymes   Acute encephalopathy   Acute diastolic CHF (congestive heart failure) (HCC)   Sepsis (HCC)   Cerebral septic emboli (HCC)   Hypertensive heart disease with heart failure (HCC)   Endotracheal tube present   Acute respiratory disease    Discharge Instructions  Discharge Instructions    DIET DYS 3   Complete by:  As directed    Fluid consistency:  Thin     Allergies as of 08/10/2017  Reactions   Morphine And Related       Medication List    STOP taking these medications   amLODipine 10 MG tablet Commonly known as:  NORVASC   diazepam 2 MG tablet Commonly known as:  VALIUM   gabapentin 300 MG  capsule Commonly known as:  NEURONTIN   glipiZIDE 10 MG tablet Commonly known as:  GLUCOTROL   hydrochlorothiazide 25 MG tablet Commonly known as:  HYDRODIURIL   lisinopril 20 MG tablet Commonly known as:  PRINIVIL,ZESTRIL   metFORMIN 500 MG tablet Commonly known as:  GLUCOPHAGE   naproxen sodium 220 MG tablet Commonly known as:  ALEVE   sildenafil 20 MG tablet Commonly known as:  REVATIO     TAKE these medications   acetaminophen 325 MG tablet Commonly known as:  TYLENOL Take 2 tablets (650 mg total) by mouth every 6 (six) hours as needed for mild pain, fever or headache.   DAPTOmycin 1,000 mg in sodium chloride 0.9 % 100 mL Inject 1,000 mg into the vein daily.   feeding supplement (ENSURE ENLIVE) Liqd Take 237 mLs by mouth 2 (two) times daily between meals.   folic acid 1 MG tablet Commonly known as:  FOLVITE Take 1 tablet (1 mg total) by mouth daily. Start taking on:  08/11/2017   furosemide 40 MG tablet Commonly known as:  LASIX Take 1 tablet (40 mg total) by mouth every 8 (eight) hours.   insulin aspart 100 UNIT/ML injection Commonly known as:  novoLOG Inject 0-15 Units into the skin 3 (three) times daily with meals.   insulin aspart 100 UNIT/ML injection Commonly known as:  novoLOG Inject 0-5 Units into the skin at bedtime.   insulin glargine 100 UNIT/ML injection Commonly known as:  LANTUS Inject 0.12 mLs (12 Units total) into the skin daily. Start taking on:  08/11/2017   metoprolol tartrate 25 MG tablet Commonly known as:  LOPRESSOR Take 1 tablet (25 mg total) by mouth 2 (two) times daily.   ondansetron 4 MG/2ML Soln injection Commonly known as:  ZOFRAN Inject 2 mLs (4 mg total) into the vein every 6 (six) hours as needed for nausea.   oxyCODONE 5 MG immediate release tablet Commonly known as:  ROXICODONE Take 1 tablet (5 mg total) by mouth every 8 (eight) hours as needed.   potassium chloride 20 MEQ/15ML (10%) Soln Take 15 mLs (20 mEq  total) by mouth 2 (two) times daily.   thiamine 100 MG tablet Take 1 tablet (100 mg total) by mouth daily. Start taking on:  08/11/2017      Follow-up Information    Schedule an appointment as soon as possible for a visit with Charlynne Pander, DDS.   Specialty:  Dentistry Why:  Evaluation of healing and suture removal  Contact information: [address] [place] [address] [phone]          Allergies  Allergen Reactions  . Morphine And Related     Consultations:  CVTS  Cardiology  ID    Procedures/Studies: Ct Angio Head W Or Wo Contrast  Result Date: 07/28/2017 CLINICAL DATA:  New onset facial droop. Sepsis. Multiple small infarctions by MRI 2 days ago. Septic emboli suspected. EXAM: CT ANGIOGRAPHY HEAD AND NECK TECHNIQUE: Multidetector CT imaging of the head and neck was performed using the standard protocol during bolus administration of intravenous contrast. Multiplanar CT image reconstructions and MIPs were obtained to evaluate the vascular anatomy. Carotid stenosis measurements (when applicable) are obtained utilizing NASCET criteria,  using the distal internal carotid diameter as the denominator. CONTRAST:  ISOVUE-370 IOPAMIDOL (ISOVUE-370) INJECTION 76% COMPARISON:  CT and MR exams 07/26/2017 FINDINGS: CT HEAD FINDINGS Brain: Generalized atrophy. Small infarctions seen previously cannot be specifically resolved. No evidence of large acute infarction, mass lesion, hemorrhage, hydrocephalus or extra-axial collection. Physiologic basal ganglia calcification. Old white matter infarction left forceps major. Vascular: There is atherosclerotic calcification of the major vessels at the base of the brain. Skull: Negative Sinuses: No acute sinusitis. Chronic mucoperiosteal thickening of the maxillary sinuses. Orbits: Negative Review of the MIP images confirms the above findings CTA NECK FINDINGS Aortic arch: Aortic atherosclerosis.  No aneurysm or dissection.  Right carotid system: Common carotid artery shows atherosclerotic disease but no flow limiting stenosis. Stenosis of 30% in the midportion. Advanced calcified plaque at the carotid bifurcation and proximal ICA. Minimal diameter is 5 mm, therefore there is no stenosis. Cervical ICA is widely patent. Focal calcified plaque just beneath the skullbase but with narrowing of only 20%. Left carotid system: Common carotid artery shows atherosclerotic plaque but without narrowing of more than 20%. Complex calcified plaque at the carotid bifurcation and proximal ICA. Minimal proximal ICA diameter 3.2 mm. Compared to a more distal cervical ICA diameter of 4 mm, this indicates a 20% stenosis. Vertebral arteries: Both vertebral artery origins are patent. Both vertebral arteries are patent through the cervical region. The right vertebral artery is a very small vessel. Skeleton: Ordinary cervical spondylosis. Other neck: No mass or lymphadenopathy. Upper chest: Areas of patchy pulmonary opacity. Layering pleural effusions. Findings could represent infiltrate or ARDS related to sepsis. Review of the MIP images confirms the above findings CTA HEAD FINDINGS Anterior circulation: Both internal carotid arteries are patent through the skullbase and siphon regions. No siphon stenosis. The anterior and middle cerebral vessels are patent without proximal stenosis, aneurysm or vascular malformation. No missing branch vessels are identified. Posterior circulation: Both vertebral arteries are patent to the basilar. The left vertebral artery is dominant. No basilar stenosis. Posterior circulation branch vessels are patent. Posterior cerebral arteries receive most of there supply from the anterior circulation. Venous sinuses: Patent and normal Anatomic variants: None significant. Probable venous angioma right posterior parietal brain. Delayed phase: No abnormal enhancement Review of the MIP images confirms the above findings IMPRESSION: No  large or medium vessel intracranial stenosis or occlusion. 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 Probable venous angioma right posterior parietal region incidentally noted. Atherosclerotic disease at both carotid bifurcation regions. No measurable stenosis on the right. 20% stenosis of the proximal ICA on the left. Though there is no flow limiting stenosis, irregular plaque could possibly serve as a source of emboli. However, 1 would not expect embolic disease throughout all vascular territories as shown on the previous MRI. Therefore, most likely source would be the heart or ascending aorta. Electronically Signed   By: Paulina Fusi M.D.   On: 07/28/2017 11:19   Dg Orthopantogram  Result Date: 08/02/2017 CLINICAL DATA:  Left maxillary pain for 2 weeks EXAM: ORTHOPANTOGRAM/PANORAMIC COMPARISON:  07/28/2017 FINDINGS: Multiple dental caries are noted throughout the mandible but primarily within a right molar. Multiple caries are noted within the residual teeth in the maxilla as well. No definitive changes of periapical abscess are seen. Mild mucosal thickening is noted within the left maxillary sinus similar to that noted on prior CT. IMPRESSION: Multiple dental caries.  No definitive periapical abscess is noted. Mucosal thickening within the left maxillary sinus. This  could be related to the patient's underlying discomfort. Electronically Signed   By: Alcide Clever M.D.   On: 08/02/2017 08:45   Dg Neck Soft Tissue  Result Date: 08/06/2017 CLINICAL DATA:  Throat pain and swelling. Difficulty eating. Extubated on 07/31/2017. EXAM: NECK SOFT TISSUES - 1+ VIEW COMPARISON:  Neck CTA 07/28/2017 FINDINGS: The lateral radiograph suggests mild prominence of the epiglottis, however there is external oxygen tubing directly superimposed over the epiglottis on both lateral radiographs which may contribute to this appearance. The hypopharynx is not distended. No gross  laryngeal airway narrowing is identified. The prevertebral soft tissues are within normal limits. Mild cervical disc degeneration and prominent bilateral carotid artery calcification are noted. There are multiple missing teeth with dental caries more fully evaluated on recent Panorex. IMPRESSION: Slight prominence of the epiglottis, not clearly pathologic and with superimposed external oxygen tubing likely contributing to this appearance. If there were strong clinical concern for epiglottitis or other acute neck infection, neck CT could be performed for further assessment though no secondary findings are present on these radiographs. Electronically Signed   By: Sebastian Ache M.D.   On: 08/06/2017 14:25   Dg Chest 1 View  Result Date: 07/30/2017 CLINICAL DATA:  Hypoxia EXAM: CHEST 1 VIEW COMPARISON:  Study obtained earlier in the day FINDINGS: Endotracheal tube tip is 5.1 cm above the carina. Nasogastric tube tip and side port are below the diaphragm. No pneumothorax. There is airspace consolidation in the left lower lobe. Lungs elsewhere are clear. Heart is upper normal in size with pulmonary vascularity within normal limits. No adenopathy. No evident bone lesions. IMPRESSION: Airspace consolidation consistent with pneumonia left base. Slightly more opacity noted in this area compared to earlier in the day. Stable cardiac silhouette. Tube positions as described without pneumothorax. Electronically Signed   By: Bretta Bang III M.D.   On: 07/30/2017 14:56   Dg Chest 2 View  Result Date: 08/08/2017 CLINICAL DATA:  Shortness of breath. EXAM: CHEST  2 VIEW COMPARISON:  07/31/2017 FINDINGS: Motion degraded study. AP and lateral views were obtained. Endotracheal and NG tube seen on the prior study are no longer evident. Asymmetric elevation right hemidiaphragm noted. There is bibasilar collapse/ consolidation with possible tiny bilateral pleural effusions. The cardio pericardial silhouette is enlarged. The  visualized bony structures of the thorax are intact. Telemetry leads overlie the chest. IMPRESSION: 1. Cardiomegaly with vascular congestion. 2. Bibasilar collapse/consolidation with small effusion. Electronically Signed   By: Kennith Center M.D.   On: 08/08/2017 09:46   Ct Head Wo Contrast  Result Date: 07/26/2017 CLINICAL DATA:  56 year old male with focal neurological deficit. Concern for stroke. Left facial droop. EXAM: CT HEAD WITHOUT CONTRAST TECHNIQUE: Contiguous axial images were obtained from the base of the skull through the vertex without intravenous contrast. COMPARISON:  None. FINDINGS: Brain: The ventricles and sulci appropriate size for patient's age. There is no acute intracranial hemorrhage. No mass effect or midline shift noted. No extra-axial fluid collection. Vascular: No hyperdense vessel or unexpected calcification. Skull: Normal. Negative for fracture or focal lesion. Sinuses/Orbits: Mild mucoperiosteal thickening of paranasal sinuses. No air-fluid levels. The mastoid air cells are clear. Other: None IMPRESSION: Unremarkable noncontrast CT of the brain. No acute intracranial hemorrhage. Electronically Signed   By: Elgie Collard M.D.   On: 07/26/2017 01:40   Ct Angio Neck W Or Wo Contrast  Result Date: 07/28/2017 CLINICAL DATA:  New onset facial droop. Sepsis. Multiple small infarctions by MRI 2 days ago. Septic emboli  suspected. EXAM: CT ANGIOGRAPHY HEAD AND NECK TECHNIQUE: Multidetector CT imaging of the head and neck was performed using the standard protocol during bolus administration of intravenous contrast. Multiplanar CT image reconstructions and MIPs were obtained to evaluate the vascular anatomy. Carotid stenosis measurements (when applicable) are obtained utilizing NASCET criteria, using the distal internal carotid diameter as the denominator. CONTRAST:  ISOVUE-370 IOPAMIDOL (ISOVUE-370) INJECTION 76% COMPARISON:  CT and MR exams 07/26/2017 FINDINGS: CT HEAD FINDINGS  Brain: Generalized atrophy. Small infarctions seen previously cannot be specifically resolved. No evidence of large acute infarction, mass lesion, hemorrhage, hydrocephalus or extra-axial collection. Physiologic basal ganglia calcification. Old white matter infarction left forceps major. Vascular: There is atherosclerotic calcification of the major vessels at the base of the brain. Skull: Negative Sinuses: No acute sinusitis. Chronic mucoperiosteal thickening of the maxillary sinuses. Orbits: Negative Review of the MIP images confirms the above findings CTA NECK FINDINGS Aortic arch: Aortic atherosclerosis.  No aneurysm or dissection. Right carotid system: Common carotid artery shows atherosclerotic disease but no flow limiting stenosis. Stenosis of 30% in the midportion. Advanced calcified plaque at the carotid bifurcation and proximal ICA. Minimal diameter is 5 mm, therefore there is no stenosis. Cervical ICA is widely patent. Focal calcified plaque just beneath the skullbase but with narrowing of only 20%. Left carotid system: Common carotid artery shows atherosclerotic plaque but without narrowing of more than 20%. Complex calcified plaque at the carotid bifurcation and proximal ICA. Minimal proximal ICA diameter 3.2 mm. Compared to a more distal cervical ICA diameter of 4 mm, this indicates a 20% stenosis. Vertebral arteries: Both vertebral artery origins are patent. Both vertebral arteries are patent through the cervical region. The right vertebral artery is a very small vessel. Skeleton: Ordinary cervical spondylosis. Other neck: No mass or lymphadenopathy. Upper chest: Areas of patchy pulmonary opacity. Layering pleural effusions. Findings could represent infiltrate or ARDS related to sepsis. Review of the MIP images confirms the above findings CTA HEAD FINDINGS Anterior circulation: Both internal carotid arteries are patent through the skullbase and siphon regions. No siphon stenosis. The anterior and  middle cerebral vessels are patent without proximal stenosis, aneurysm or vascular malformation. No missing branch vessels are identified. Posterior circulation: Both vertebral arteries are patent to the basilar. The left vertebral artery is dominant. No basilar stenosis. Posterior circulation branch vessels are patent. Posterior cerebral arteries receive most of there supply from the anterior circulation. Venous sinuses: Patent and normal Anatomic variants: None significant. Probable venous angioma right posterior parietal brain. Delayed phase: No abnormal enhancement Review of the MIP images confirms the above findings IMPRESSION: No large or medium vessel intracranial stenosis or occlusion. 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 Probable venous angioma right posterior parietal region incidentally noted. Atherosclerotic disease at both carotid bifurcation regions. No measurable stenosis on the right. 20% stenosis of the proximal ICA on the left. Though there is no flow limiting stenosis, irregular plaque could possibly serve as a source of emboli. However, 1 would not expect embolic disease throughout all vascular territories as shown on the previous MRI. Therefore, most likely source would be the heart or ascending aorta. Electronically Signed   By: Paulina Fusi M.D.   On: 07/28/2017 11:19   Ct Chest W Contrast  Result Date: 08/01/2017 CLINICAL DATA:  56 year old male with altered mental status and methicillin-resistant Staph aureus bacteremia. COPD. Subsequent encounter. EXAM: CT CHEST, ABDOMEN, AND PELVIS WITH CONTRAST TECHNIQUE: Multidetector CT imaging of the chest, abdomen  and pelvis was performed following the standard protocol during bolus administration of intravenous contrast. CONTRAST:  ISOVUE-300 IOPAMIDOL (ISOVUE-300) INJECTION 61% COMPARISON:  07/31/2017 chest x-ray. 07/20/2017 and chest CT. 08/10/2013 CT angiogram abdomen and pelvis.  FINDINGS: CT CHEST FINDINGS Cardiovascular: Top-normal heart size. Prominent 3 vessel coronary artery calcification. Calcification thoracic aorta. Ascending thoracic aorta measures up to 4.2 cm. Exam not optimized to evaluate for pulmonary embolus. No large central pulmonary embolus. Mediastinum/Nodes: Slightly enlarged calcified lymph node lower right paraesophageal region measuring up to 1.3 cm. Lungs/Pleura: Interval development of pulmonary edema and small pleural effusions. Bibasilar consolidation suggestive of atelectasis versus infiltrates. Additionally, subtle patchy hazy parenchymal changes in the upper lung zones bilaterally raises possibly of multifocal pneumonia. Musculoskeletal: Remote left-sided rib fractures. CT ABDOMEN PELVIS FINDINGS Hepatobiliary: Slightly enlarged minimally lobulated liver spanning over 19 cm. Cannot exclude changes of early cirrhosis. No worrisome hepatic lesion. No worrisome focal hepatic lesion. Pancreas: No worrisome pancreatic mass or inflammation. Spleen: Enlarged spleen spanning over 16 cm with peripheral wedge-shaped hypodensities raises possibility of embolic splenic infarcts. Adrenals/Urinary Tract: No obstructing stone or hydronephrosis. Small wedge-shaped hypodensity inferior aspect left kidney. Question small embolic infarct. No adrenal mass. Foley catheter in place with decompressed urinary bladder with circumferential wall thickening. Stomach/Bowel: Narrowing of the mid to distal transverse colon may represent peristalsis although limiting evaluation for detection of underlying mass. Surgical clips right lower quadrant may indicate prior appendectomy. Upper abdominal 1.2 cm hernia through which knuckle of small bowel traverses but does not cause obstruction. No extraluminal bowel inflammatory process or free air Vascular/Lymphatic: Atherosclerotic changes aorta without aneurysm. Atherosclerotic changes aortic branch vessels with narrowing without large vessel  occlusion. No worrisome pelvic or abdominal adenopathy Reproductive: No worrisome abnormality Other: Fat and vessel containing hernias Musculoskeletal: Degenerative changes lumbar spine. 1.1 cm lucency right L5 pedicle/ superior facet may represent result of subchondral cystic changes given the adjacent facet degenerative changes. IMPRESSION: CHEST CT Interval development of pulmonary edema and small pleural effusions. Bibasilar consolidation suggestive of atelectasis versus infiltrates. Additionally, subtle patchy hazy parenchymal changes in the upper lung zones bilaterally raises possibly of multifocal pneumonia. Prominent 3 vessel coronary artery calcification. Aortic Atherosclerosis (ICD10-I70.0). Ascending thoracic aorta measures up to 4.2 cm. Recommend annual imaging followup by CTA or MRA. This recommendation follows 2010 ACCF/AHA/AATS/ACR/ASA/SCA/SCAI/SIR/STS/SVM Guidelines for the Diagnosis and Management of Patients with Thoracic Aortic Disease. Circulation. 2010; 121: [id number] Slightly enlarged calcified lymph node lower right paraesophageal region measuring up to 1.3 cm. Etiology/significance indeterminate. ABDOMINAL AND PELVIC CT Enlarged spleen spanning over 16 cm with peripheral wedge-shaped hypodensities raises possibility of septic embolic splenic infarcts. Small wedge-shaped hypodensity inferior aspect left kidney. Question small septic embolic infarct. Slightly enlarged minimally lobulated liver spanning over 19 cm. Cannot exclude changes of early cirrhosis. Narrowing of the mid to distal transverse colon may represent peristalsis although limiting evaluation for detection of underlying mass. Surgical clips right lower quadrant may indicate prior appendectomy. Upper abdominal 1.2 cm hernia through which knuckle of small bowel traverses but does not cause obstruction. No extraluminal bowel inflammatory process or free air. Aortic Atherosclerosis (ICD10-I70.0). 1.1 cm lucency right L5 pedicle/  superior facet may represent result of subchondral cystic changes given the adjacent facet degenerative changes. These results will be called to the ordering clinician or representative by the Radiologist Assistant, and communication documented in the PACS or zVision Dashboard. Electronically Signed   By: Lacy Duverney M.D.   On: 08/01/2017 15:23   Mr Brain Wo Contrast  Result  Date: 07/26/2017 CLINICAL DATA:  Focal neuro deficit. MRSA bacteremia. Altered mental status EXAM: MRI HEAD WITHOUT CONTRAST TECHNIQUE: Multiplanar, multiecho pulse sequences of the brain and surrounding structures were obtained without intravenous contrast. COMPARISON:  CT head 07/26/2017 FINDINGS: Limited study. The patient not able to complete the study. Axial diffusion sagittal T1 images were obtained before study was terminated prematurely Multiple small areas of restricted diffusion are present in both cerebral hemispheres in occipital, parietal, and frontal lobes. Focal restricted diffusion right caudate. Small restricted diffusion right cerebellum. Ventricle size normal.  No midline shift. IMPRESSION: Incomplete study. 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 Consider septic emboli given the history of bacteremia. Electronically Signed   By: Marlan Palau M.D.   On: 07/26/2017 11:33   Mr Cervical Spine W Wo Contrast  Addendum Date: 08/08/2017   ADDENDUM REPORT: 08/08/2017 15:17 ADDENDUM: These results were called by telephone at the time of interpretation on 08/08/2017 at 3:15 pm to the patient's nurse, Tressia Miners,, who verbally acknowledged these results. Electronically Signed   By: Drusilla Kanner M.D.   On: 08/08/2017 15:17   Result Date: 08/08/2017 CLINICAL DATA:  Neck pain and patient with bacterial endocarditis. Question infection. EXAM: MRI CERVICAL SPINE WITHOUT AND WITH CONTRAST TECHNIQUE: Multiplanar and multiecho pulse sequences of  the cervical spine, to include the craniocervical junction and cervicothoracic junction, were obtained without and with intravenous contrast. CONTRAST:  20 ml MULTIHANCE GADOBENATE DIMEGLUMINE 529 MG/ML IV SOLN COMPARISON:  CT angiogram of the neck 07/28/2017. FINDINGS: Alignment: There is trace retrolisthesis of C4 on C5. Alignment is otherwise maintained. Vertebrae: Abnormal edema and enhancement are seen in the C4-5 endplates consistent with discitis and osteomyelitis. No epidural abscess is identified. The patient also has abnormal edema and enhancement in the odontoid process and in the anterior arch of C1. Marrow edema and enhancement are also identified at the articulation of the occipital condyles and lateral masses of C1 which appears worse on the right where there is an associated small effusion highly suspicious for septic joint. Cord: Normal signal throughout. Posterior Fossa, vertebral arteries, paraspinal tissues: There is a small fluid collection along the right lateral mass of C2 measuring approximately 1.8 cm AP by 0.3 cm transverse by 0.9 cm craniocaudal worrisome for abscess. Edema and enhancement are seen and posterior paraspinous musculature, particularly the semispinalis muscle on the right where there is an abscess measuring 1.2 cm AP by 0.3 cm transverse at the level of the C2-3 disc interspace. This abscess likely communicates with the abscess described above. Although no epidural abscess is identified, there is epidural enhancement posterior to C2 and at C4-5 and extensive prevertebral soft tissue edema and enhancement. Disc levels: Patient motion somewhat limits evaluation of the disc interspaces. C1-2:  The central canal is open. C2-3:  Mild disc bulge without stenosis. C3-4:  Minimal disc bulge without stenosis. C4-5: Shallow disc bulge effaces the ventral thecal sac. Uncovertebral disease causes mild to moderate foraminal narrowing, worse on the left. C5-6: There is a shallow disc  bulge, uncovertebral disease and facet arthropathy. The ventral thecal sac appears effaced. Severe left and moderate right foraminal narrowing is identified. C6-7: Shallow disc bulge without central canal stenosis. Mild to moderate left foraminal narrowing is seen. The right foramen is open. C7-T1:  Negative. IMPRESSION: Findings consistent with osteomyelitis in the odontoid process of C2 and anterior arch of C1. Negative for epidural abscess. Marrow edema and enhancement about the  articulation of the occipital condyles and lateral masses of C1, worse on the right, consistent osteomyelitis and septic joint. As described above, there is a small abscess along the right lateral mass of C2 in the paraspinous soft tissues. Findings consistent with discitis and osteomyelitis at C4-5. Negative for epidural abscess. Edema and enhancement in paraspinous musculature consistent with myositis. Changes are worse on the right where there is a small abscess in semispinalis muscle at the level of the C2-3 disc interspace. This abscess likely communicates with the abscess along the right lateral mass of C2 described above. Electronically Signed: By: Drusilla Kannerhomas  Dalessio M.D. On: 08/08/2017 15:11   Ct Abdomen Pelvis W Contrast  Result Date: 08/01/2017 CLINICAL DATA:  56 year old male with altered mental status and methicillin-resistant Staph aureus bacteremia. COPD. Subsequent encounter. EXAM: CT CHEST, ABDOMEN, AND PELVIS WITH CONTRAST TECHNIQUE: Multidetector CT imaging of the chest, abdomen and pelvis was performed following the standard protocol during bolus administration of intravenous contrast. CONTRAST:  100mL ISOVUE-300 IOPAMIDOL (ISOVUE-300) INJECTION 61% COMPARISON:  07/31/2017 chest x-ray. 07/20/2017 and chest CT. 08/10/2013 CT angiogram abdomen and pelvis. FINDINGS: CT CHEST FINDINGS Cardiovascular: Top-normal heart size. Prominent 3 vessel coronary artery calcification. Calcification thoracic aorta. Ascending thoracic  aorta measures up to 4.2 cm. Exam not optimized to evaluate for pulmonary embolus. No large central pulmonary embolus. Mediastinum/Nodes: Slightly enlarged calcified lymph node lower right paraesophageal region measuring up to 1.3 cm. Lungs/Pleura: Interval development of pulmonary edema and small pleural effusions. Bibasilar consolidation suggestive of atelectasis versus infiltrates. Additionally, subtle patchy hazy parenchymal changes in the upper lung zones bilaterally raises possibly of multifocal pneumonia. Musculoskeletal: Remote left-sided rib fractures. CT ABDOMEN PELVIS FINDINGS Hepatobiliary: Slightly enlarged minimally lobulated liver spanning over 19 cm. Cannot exclude changes of early cirrhosis. No worrisome hepatic lesion. No worrisome focal hepatic lesion. Pancreas: No worrisome pancreatic mass or inflammation. Spleen: Enlarged spleen spanning over 16 cm with peripheral wedge-shaped hypodensities raises possibility of embolic splenic infarcts. Adrenals/Urinary Tract: No obstructing stone or hydronephrosis. Small wedge-shaped hypodensity inferior aspect left kidney. Question small embolic infarct. No adrenal mass. Foley catheter in place with decompressed urinary bladder with circumferential wall thickening. Stomach/Bowel: Narrowing of the mid to distal transverse colon may represent peristalsis although limiting evaluation for detection of underlying mass. Surgical clips right lower quadrant may indicate prior appendectomy. Upper abdominal 1.2 cm hernia through which knuckle of small bowel traverses but does not cause obstruction. No extraluminal bowel inflammatory process or free air Vascular/Lymphatic: Atherosclerotic changes aorta without aneurysm. Atherosclerotic changes aortic branch vessels with narrowing without large vessel occlusion. No worrisome pelvic or abdominal adenopathy Reproductive: No worrisome abnormality Other: Fat and vessel containing hernias Musculoskeletal: Degenerative changes  lumbar spine. 1.1 cm lucency right L5 pedicle/ superior facet may represent result of subchondral cystic changes given the adjacent facet degenerative changes. IMPRESSION: CHEST CT Interval development of pulmonary edema and small pleural effusions. Bibasilar consolidation suggestive of atelectasis versus infiltrates. Additionally, subtle patchy hazy parenchymal changes in the upper lung zones bilaterally raises possibly of multifocal pneumonia. Prominent 3 vessel coronary artery calcification. Aortic Atherosclerosis (ICD10-I70.0). Ascending thoracic aorta measures up to 4.2 cm. Recommend annual imaging followup by CTA or MRA. This recommendation follows 2010 ACCF/AHA/AATS/ACR/ASA/SCA/SCAI/SIR/STS/SVM Guidelines for the Diagnosis and Management of Patients with Thoracic Aortic Disease. Circulation. 2010; 121: [id number] Slightly enlarged calcified lymph node lower right paraesophageal region measuring up to 1.3 cm. Etiology/significance indeterminate. ABDOMINAL AND PELVIC CT Enlarged spleen spanning over 16 cm with peripheral wedge-shaped hypodensities raises possibility of septic embolic  splenic infarcts. Small wedge-shaped hypodensity inferior aspect left kidney. Question small septic embolic infarct. Slightly enlarged minimally lobulated liver spanning over 19 cm. Cannot exclude changes of early cirrhosis. Narrowing of the mid to distal transverse colon may represent peristalsis although limiting evaluation for detection of underlying mass. Surgical clips right lower quadrant may indicate prior appendectomy. Upper abdominal 1.2 cm hernia through which knuckle of small bowel traverses but does not cause obstruction. No extraluminal bowel inflammatory process or free air. Aortic Atherosclerosis (ICD10-I70.0). 1.1 cm lucency right L5 pedicle/ superior facet may represent result of subchondral cystic changes given the adjacent facet degenerative changes. These results will be called to the ordering clinician or  representative by the Radiologist Assistant, and communication documented in the PACS or zVision Dashboard. Electronically Signed   By: Lacy Duverney M.D.   On: 08/01/2017 15:23   Dg Chest Port 1 View  Result Date: 07/31/2017 CLINICAL DATA:  56 year old male with recent MRA assay bacteremia. Intubated for respiratory insufficiency. EXAM: PORTABLE CHEST 1 VIEW COMPARISON:  07/30/2017 and earlier. FINDINGS: Portable AP semi upright view at 0437 hours. Stable endotracheal tube tip between the level the clavicles and carina. Enteric tube in place with tip not clearly identified. Stable lung volumes. Stable cardiac size and mediastinal contours. Patchy in veiling bibasilar opacity, greater on the left, with ventilation not significantly changed since 07/27/2017. No pneumothorax or pulmonary edema. IMPRESSION: 1.  Stable lines and tubes. 2. Stable ventilation since 07/27/2017. Bibasilar patchy opacity, more confluent on the left. Consider atelectasis and/or pneumonia. Electronically Signed   By: Odessa Fleming M.D.   On: 07/31/2017 07:15   Dg Chest Port 1 View  Result Date: 07/30/2017 CLINICAL DATA:  Acute respiratory distress.  Endotracheal placement. EXAM: PORTABLE CHEST 1 VIEW COMPARISON:  07/28/2017 FINDINGS: Endotracheal tube tip is 4 cm above the carina. Nasogastric tube enters the abdomen. There is worsening density at both lung bases probably due to a combination of pleural fluid and lower lobe atelectasis and or infiltrate. Upper lungs remain clear. IMPRESSION: Worsening density in the lower chest consistent with lower lobe volume loss or infiltrate probably associated with effusions. Electronically Signed   By: Paulina Fusi M.D.   On: 07/30/2017 06:45   Dg Chest Port 1 View  Result Date: 07/28/2017 CLINICAL DATA:  Acute respiratory failure. EXAM: PORTABLE CHEST 1 VIEW COMPARISON:  Radiograph July 27, 2017. FINDINGS: Stable cardiomegaly. Endotracheal and nasogastric tubes are unchanged in position. No  definite pneumothorax is noted. Right lung is clear. Left basilar opacity is noted concerning for atelectasis or pneumonia with mild associated pleural effusion. Old left rib fractures are noted. IMPRESSION: Stable support apparatus. Left basilar atelectasis or pneumonia is noted with mild associated pleural effusion. Electronically Signed   By: Lupita Raider, M.D.   On: 07/28/2017 07:47   Dg Chest Port 1 View  Result Date: 07/27/2017 CLINICAL DATA:  Hypoxia EXAM: PORTABLE CHEST 1 VIEW COMPARISON:  July 27, 2017 study obtained earlier in the day FINDINGS: Endotracheal tube tip is 5.1 cm above the carina. Nasogastric tube tip and side port are below the diaphragm. No pneumothorax. There is atelectatic change in the left base with small left pleural effusion. The lungs elsewhere are clear. Heart is mildly enlarged with pulmonary vascularity within normal limits. No adenopathy. There is aortic atherosclerosis. No evident bone lesions. IMPRESSION: Tube positions as described without pneumothorax. Left base atelectasis with small left pleural effusion. No edema or consolidation evident. Heart mildly enlarged but stable. There is aortic  atherosclerosis. Aortic Atherosclerosis (ICD10-I70.0). Electronically Signed   By: Bretta BangWilliam  Woodruff III M.D.   On: 07/27/2017 15:38   Dg Chest Port 1 View  Result Date: 07/27/2017 CLINICAL DATA:  Endotracheal tube placement. EXAM: PORTABLE CHEST 1 VIEW COMPARISON:  07/25/2017 . FINDINGS: Endotracheal tube noted 10.3 cm above the carina at the thoracic inlet. Advancement of approximately 5 cm should be considered. Cardiomegaly with pulmonary vascular prominence, bilateral interstitial prominence, bilateral pleural effusions again noted. Findings consistent CHF. IMPRESSION: 1. Endotracheal tube noted 10.3 cm above the carina at the thoracic inlet. Advancement of approximately 5 cm should be considered. 2. Persistent cardiomegaly with bilateral from interstitial prominence of  small pleural effusions consistent persistent CHF. Similar findings noted on prior exam. These results will be called to the ordering clinician or representative by the Radiologist Assistant, and communication documented in the PACS or zVision Dashboard. Electronically Signed   By: Maisie Fushomas  Register   On: 07/27/2017 09:00   Dg Chest Port 1 View  Result Date: 07/25/2017 CLINICAL DATA:  56 y/o  M; sepsis. EXAM: PORTABLE CHEST 1 VIEW COMPARISON:  07/24/2017 chest radiograph. FINDINGS: Stable cardiac silhouette given projection and technique. Aortic atherosclerosis with calcification. Increase hazy opacification of the lungs and reticular markings. Probable small left effusion. Fluid tracking along the right minor fissure. Bones are unremarkable. IMPRESSION: Increasing pulmonary edema and probable small effusions greater on the left. Underlying pneumonia not excluded. Electronically Signed   By: Mitzi HansenLance  Furusawa-Stratton M.D.   On: 07/25/2017 22:07   Dg Abd Portable 1v  Result Date: 07/27/2017 CLINICAL DATA:  Assess nasogastric tube. EXAM: PORTABLE ABDOMEN - 1 VIEW COMPARISON:  CT abdomen and pelvis August 18, 2013 FINDINGS: Nasogastric tube tip and side-port project in proximal stomach. Included bowel gas pattern is nondilated and nonobstructive. Abdominal herniorrhaphy. No intra-abdominal mass effect or pathologic calcifications. Retrocardiac consolidation. Soft tissue planes and included osseous structures are nonacute. IMPRESSION: Nasogastric tube tip projects in proximal stomach. Electronically Signed   By: Awilda Metroourtnay  Bloomer M.D.   On: 07/27/2017 15:50      Subjective: Feels tired, legs weak.  No dyspnea.  No leg swelling.  No chest pain.  Mild fever.  Neck pain severe, but stable.  No paresthesias, arm or leg weakness.  No confusion.  Discharge Exam: Vitals:   08/10/17 0400 08/10/17 0953  BP: 128/71 115/69  Pulse: 80 93  Resp: 18   Temp: 99 F (37.2 C)   SpO2: 90%    Vitals:   08/09/17  1945 08/10/17 0000 08/10/17 0400 08/10/17 0953  BP: 121/77 (!) 106/59 128/71 115/69  Pulse: 86 88 80 93  Resp: (!) 22 18 18    Temp: 100.1 F (37.8 C) 98.4 F (36.9 C) 99 F (37.2 C)   TempSrc: Oral Oral Oral   SpO2: 94% 95% 90%   Weight:   107.7 kg (237 lb 6.4 oz)   Height:        General: Pt is alert, awake, debilitated, in bed Cardiovascular: RRR, S1/S2 +, positive Sys murmur, no rubs, no gallops Respiratory: CTA bilaterally, no rales Abdominal: Soft, NT, ND, bowel sounds + Extremities: no LE edema, no cyanosis    The results of significant diagnostics from this hospitalization (including imaging, microbiology, ancillary and laboratory) are listed below for reference.     Microbiology: Recent Results (from the past 240 hour(s))  Culture, blood (Routine X 2) w Reflex to ID Panel     Status: None   Collection Time: 08/03/17 12:32 PM  Result Value  Ref Range Status   Specimen Description BLOOD LEFT HAND  Final   Special Requests   Final    BOTTLES DRAWN AEROBIC AND ANAEROBIC Blood Culture adequate volume   Culture NO GROWTH 5 DAYS  Final   Report Status 08/08/2017 FINAL  Final  Culture, blood (Routine X 2) w Reflex to ID Panel     Status: None   Collection Time: 08/03/17 12:40 PM  Result Value Ref Range Status   Specimen Description BLOOD LEFT HAND  Final   Special Requests   Final    BOTTLES DRAWN AEROBIC AND ANAEROBIC Blood Culture results may not be optimal due to an inadequate volume of blood received in culture bottles   Culture NO GROWTH 5 DAYS  Final   Report Status 08/08/2017 FINAL  Final     Labs: BNP (last 3 results) Recent Labs    07/26/17 0340  BNP 104.6*   Basic Metabolic Panel: Recent Labs  Lab 08/05/17 2351 08/07/17 0502 08/08/17 0407 08/09/17 0538 08/10/17 0413  NA 131* 132* 134* 132* 130*  K 3.9 3.7 3.4* 4.2 3.5  CL 98* 99* 100* 95* 95*  CO2 25 26 27 28 28   GLUCOSE 111* 175* 136* 169* 212*  BUN 25* 20 25* 16 13  CREATININE 0.91 0.91  0.93 0.84 0.71  CALCIUM 8.5* 8.4* 8.5* 8.6* 8.5*   Liver Function Tests: No results for input(s): AST, ALT, ALKPHOS, BILITOT, PROT, ALBUMIN in the last 168 hours. No results for input(s): LIPASE, AMYLASE in the last 168 hours. No results for input(s): AMMONIA in the last 168 hours. CBC: Recent Labs  Lab 08/06/17 0240 08/07/17 0502 08/08/17 0407 08/09/17 0538 08/10/17 0413  WBC 7.4 6.8 7.3 6.5 5.4  HGB 9.0* 8.5* 8.6* 8.6* 8.2*  HCT 28.2* 26.3* 27.0* 26.4* 24.9*  MCV 94.9 93.3 94.4 93.0 92.6  PLT 305 279 295 294 280   Cardiac Enzymes: Recent Labs  Lab 08/07/17 0502  CKTOTAL 19*   BNP: Invalid input(s): POCBNP CBG: Recent Labs  Lab 08/09/17 1115 08/09/17 1616 08/09/17 2109 08/10/17 0614 08/10/17 1144  GLUCAP 153* 286* 185* 178* 249*   D-Dimer No results for input(s): DDIMER in the last 72 hours. Hgb A1c No results for input(s): HGBA1C in the last 72 hours. Lipid Profile Recent Labs    08/08/17 0407  TRIG 290*   Thyroid function studies No results for input(s): TSH, T4TOTAL, T3FREE, THYROIDAB in the last 72 hours.  Invalid input(s): FREET3 Anemia work up No results for input(s): VITAMINB12, FOLATE, FERRITIN, TIBC, IRON, RETICCTPCT in the last 72 hours. Urinalysis    Component Value Date/Time   COLORURINE STRAW (A) 07/28/2017 1330   APPEARANCEUR CLEAR 07/28/2017 1330   LABSPEC 1.010 07/28/2017 1330   PHURINE 5.0 07/28/2017 1330   GLUCOSEU NEGATIVE 07/28/2017 1330   HGBUR NEGATIVE 07/28/2017 1330   BILIRUBINUR NEGATIVE 07/28/2017 1330   KETONESUR NEGATIVE 07/28/2017 1330   PROTEINUR NEGATIVE 07/28/2017 1330   NITRITE NEGATIVE 07/28/2017 1330   LEUKOCYTESUR NEGATIVE 07/28/2017 1330   Sepsis Labs Invalid input(s): PROCALCITONIN,  WBC,  LACTICIDVEN Microbiology Recent Results (from the past 240 hour(s))  Culture, blood (Routine X 2) w Reflex to ID Panel     Status: None   Collection Time: 08/03/17 12:32 PM  Result Value Ref Range Status   Specimen  Description BLOOD LEFT HAND  Final   Special Requests   Final    BOTTLES DRAWN AEROBIC AND ANAEROBIC Blood Culture adequate volume   Culture NO GROWTH 5 DAYS  Final   Report Status 08/08/2017 FINAL  Final  Culture, blood (Routine X 2) w Reflex to ID Panel     Status: None   Collection Time: 08/03/17 12:40 PM  Result Value Ref Range Status   Specimen Description BLOOD LEFT HAND  Final   Special Requests   Final    BOTTLES DRAWN AEROBIC AND ANAEROBIC Blood Culture results may not be optimal due to an inadequate volume of blood received in culture bottles   Culture NO GROWTH 5 DAYS  Final   Report Status 08/08/2017 FINAL  Final     Time coordinating discharge: Over 30 minutes  SIGNED:   Alberteen Sam, MD  [facility] 08/10/2017, 2:43 PM   If 7PM-7AM, please contact night-coverage [email or web address] Password [email or web address]

## 2017-08-10 NOTE — Progress Notes (Signed)
Subjective: No new complaints, pt having doppler of his neck   Antibiotics:  Anti-infectives (From admission, onward)   Start     Dose/Rate Route Frequency Ordered Stop   08/11/17 1430  DAPTOmycin (CUBICIN) 1,000 mg in sodium chloride 0.9 % IVPB     1,000 mg 240 mL/hr over 30 Minutes Intravenous Every 24 hours 08/10/17 1617     08/10/17 2000  ceftaroline (TEFLARO) 600 mg in sodium chloride 0.9 % 250 mL IVPB     600 mg 250 mL/hr over 60 Minutes Intravenous Every 12 hours 08/10/17 1617        Medications: Scheduled Meds: . chlorhexidine  15 mL Mouth Rinse BID  . [START ON 08/11/2017] feeding supplement (ENSURE ENLIVE)  237 mL Oral BID BM  . [START ON 08/11/2017] folic acid  1 mg Oral Daily  . furosemide  40 mg Oral TID  . insulin aspart  0-15 Units Subcutaneous TID WC  . [START ON 08/11/2017] insulin glargine  7 Units Subcutaneous Daily  . [START ON 08/11/2017] lidocaine  1 patch Transdermal Q24H  . metoprolol tartrate  25 mg Oral BID  . potassium chloride  20 mEq Oral BID  . [START ON 08/11/2017] thiamine  100 mg Oral Daily   Continuous Infusions: . sodium chloride    . ceFTAROline (TEFLARO) IV    . [START ON 08/11/2017] DAPTOmycin (CUBICIN)  IV     PRN Meds:.sodium chloride, acetaminophen, ipratropium-albuterol, traMADol    Objective: Weight change:  No intake or output data in the 24 hours ending 08/10/17 1640 There were no vitals taken for this visit. Temp:  [98.4 F (36.9 C)-100.1 F (37.8 C)] 99 F (37.2 C) (12/20 0400) Pulse Rate:  [80-93] 93 (12/20 0953) Resp:  [18-22] 18 (12/20 0400) BP: (106-128)/(59-77) 115/69 (12/20 0953) SpO2:  [90 %-95 %] 90 % (12/20 0400) Weight:  [237 lb 6.4 oz (107.7 kg)] 237 lb 6.4 oz (107.7 kg) (12/20 0400)  Physical Exam: General: Alert and awake, oriented x3,having doppler done on neck HEENT: anicteric sclera Neuro: nonfocal     CBC:  CBC Latest Ref Rng & Units 08/10/2017 08/09/2017 08/08/2017  WBC 4.0 -  10.5 K/uL 5.4 6.5 7.3  Hemoglobin 13.0 - 17.0 g/dL 8.2(L) 8.6(L) 8.6(L)  Hematocrit 39.0 - 52.0 % 24.9(L) 26.4(L) 27.0(L)  Platelets 150 - 400 K/uL 280 294 295      BMET Recent Labs    08/09/17 0538 08/10/17 0413  NA 132* 130*  K 4.2 3.5  CL 95* 95*  CO2 28 28  GLUCOSE 169* 212*  BUN 16 13  CREATININE 0.84 0.71  CALCIUM 8.6* 8.5*     Liver Panel  No results for input(s): PROT, ALBUMIN, AST, ALT, ALKPHOS, BILITOT, BILIDIR, IBILI in the last 72 hours.     Sedimentation Rate No results for input(s): ESRSEDRATE in the last 72 hours. C-Reactive Protein No results for input(s): CRP in the last 72 hours.  Micro Results: Recent Results (from the past 720 hour(s))  Culture, blood (x 2)     Status: None   Collection Time: 07/25/17  9:09 PM  Result Value Ref Range Status   Specimen Description BLOOD RIGHT HAND  Final   Special Requests   Final    BOTTLES DRAWN AEROBIC AND ANAEROBIC Blood Culture adequate volume   Culture   Final    NO GROWTH 5 DAYS Performed at [facility], [address] [place], [place] [address]  Report Status 07/31/2017 FINAL  Final  Culture, blood (x 2)     Status: Abnormal   Collection Time: 07/25/17  9:14 PM  Result Value Ref Range Status   Specimen Description BLOOD RIGHT ANTECUBITAL  Final   Special Requests IN PEDIATRIC BOTTLE Blood Culture adequate volume  Final   Culture  Setup Time   Final    GRAM POSITIVE COCCI IN PEDIATRIC BOTTLE CRITICAL RESULT CALLED TO, READ BACK BY AND VERIFIED WITH: JESSE GRIMSLEY PHARMD AT 2225 ON [id number] BY SJW Performed at [facility], [address] [place], [place] [address]    Culture METHICILLIN RESISTANT STAPHYLOCOCCUS AUREUS (A)  Final   Report Status 07/28/2017 FINAL  Final   Organism ID, Bacteria METHICILLIN RESISTANT STAPHYLOCOCCUS AUREUS  Final      Susceptibility   Methicillin resistant staphylococcus aureus - MIC*    CIPROFLOXACIN >=8 RESISTANT Resistant     ERYTHROMYCIN >=8  RESISTANT Resistant     GENTAMICIN <=0.5 SENSITIVE Sensitive     OXACILLIN >=4 RESISTANT Resistant     TETRACYCLINE <=1 SENSITIVE Sensitive     VANCOMYCIN <=0.5 SENSITIVE Sensitive     TRIMETH/SULFA <=10 SENSITIVE Sensitive     CLINDAMYCIN <=0.25 SENSITIVE Sensitive     RIFAMPIN <=0.5 SENSITIVE Sensitive     Inducible Clindamycin NEGATIVE Sensitive     * METHICILLIN RESISTANT STAPHYLOCOCCUS AUREUS  Blood Culture ID Panel (Reflexed)     Status: Abnormal   Collection Time: 07/25/17  9:14 PM  Result Value Ref Range Status   Enterococcus species NOT DETECTED NOT DETECTED Final   Listeria monocytogenes NOT DETECTED NOT DETECTED Final   Staphylococcus species DETECTED (A) NOT DETECTED Final    Comment: CRITICAL RESULT CALLED TO, READ BACK BY AND VERIFIED WITH: JESSEE GRIMSLEY PHARMD AT 2225 ON [id number] BY SJW    Staphylococcus aureus DETECTED (A) NOT DETECTED Final    Comment: Methicillin (oxacillin)-resistant Staphylococcus aureus (MRSA). MRSA is predictably resistant to beta-lactam antibiotics (except ceftaroline). Preferred therapy is vancomycin unless clinically contraindicated. Patient requires contact precautions if  hospitalized. CRITICAL RESULT CALLED TO, READ BACK BY AND VERIFIED WITH: JESSE GRIMSLEY PHARMD AT 2225 ON [id number] BY SJW    Methicillin resistance DETECTED (A) NOT DETECTED Final    Comment: CRITICAL RESULT CALLED TO, READ BACK BY AND VERIFIED WITH: JESSE GRIMSLEY PHARMD AT 2225 ON [id number] BY SJW    Streptococcus species NOT DETECTED NOT DETECTED Final   Streptococcus agalactiae NOT DETECTED NOT DETECTED Final   Streptococcus pneumoniae NOT DETECTED NOT DETECTED Final   Streptococcus pyogenes NOT DETECTED NOT DETECTED Final   Acinetobacter baumannii NOT DETECTED NOT DETECTED Final   Enterobacteriaceae species NOT DETECTED NOT DETECTED Final   Enterobacter cloacae complex NOT DETECTED NOT DETECTED Final   Escherichia coli NOT DETECTED NOT DETECTED Final   Klebsiella  oxytoca NOT DETECTED NOT DETECTED Final   Klebsiella pneumoniae NOT DETECTED NOT DETECTED Final   Proteus species NOT DETECTED NOT DETECTED Final   Serratia marcescens NOT DETECTED NOT DETECTED Final   Haemophilus influenzae NOT DETECTED NOT DETECTED Final   Neisseria meningitidis NOT DETECTED NOT DETECTED Final   Pseudomonas aeruginosa NOT DETECTED NOT DETECTED Final   Candida albicans NOT DETECTED NOT DETECTED Final   Candida glabrata NOT DETECTED NOT DETECTED Final   Candida krusei NOT DETECTED NOT DETECTED Final   Candida parapsilosis NOT DETECTED NOT DETECTED Final   Candida tropicalis NOT DETECTED NOT DETECTED Final    Comment: Performed at [facility], [address] [place], [place] [address]  MRSA PCR Screening     Status: Abnormal   Collection Time: 07/27/17  6:20 PM  Result Value Ref Range Status   MRSA by PCR POSITIVE (A) NEGATIVE Final    Comment:        The GeneXpert MRSA Assay (FDA approved for NASAL specimens only), is one component of a comprehensive MRSA colonization surveillance program. It is not intended to diagnose MRSA infection nor to guide or monitor treatment for MRSA infections. RESULT CALLED TO, READ BACK BY AND VERIFIED WITH: WOODY,J RN 12.6.18 @2014  ZANDO,C   Culture, blood (routine x 2)     Status: Abnormal   Collection Time: 07/28/17  7:44 AM  Result Value Ref Range Status   Specimen Description BLOOD LEFT HAND  Final   Special Requests IN PEDIATRIC BOTTLE Blood Culture adequate volume  Final   Culture  Setup Time   Final    GRAM POSITIVE COCCI IN CLUSTERS IN PEDIATRIC BOTTLE CRITICAL VALUE NOTED.  VALUE IS CONSISTENT WITH PREVIOUSLY REPORTED AND CALLED VALUE.    Culture (A)  Final    STAPHYLOCOCCUS AUREUS SUSCEPTIBILITIES PERFORMED ON PREVIOUS CULTURE WITHIN THE LAST 5 DAYS. Performed at [facility], [address] [place], [place] [address]    Report Status 07/30/2017 FINAL  Final  Culture, blood (routine x 2)     Status:  Abnormal   Collection Time: 07/28/17  7:45 AM  Result Value Ref Range Status   Specimen Description BLOOD RIGHT HAND  Final   Special Requests IN PEDIATRIC BOTTLE Blood Culture adequate volume  Final   Culture  Setup Time   Final    GRAM POSITIVE COCCI IN CLUSTERS IN PEDIATRIC BOTTLE CRITICAL VALUE NOTED.  VALUE IS CONSISTENT WITH PREVIOUSLY REPORTED AND CALLED VALUE.    Culture (A)  Final    STAPHYLOCOCCUS AUREUS SUSCEPTIBILITIES PERFORMED ON PREVIOUS CULTURE WITHIN THE LAST 5 DAYS. Performed at [facility], [address] [place], [place] [address]    Report Status 07/30/2017 FINAL  Final  Culture, blood (Routine X 2) w Reflex to ID Panel     Status: Abnormal   Collection Time: 07/31/17  9:37 AM  Result Value Ref Range Status   Specimen Description BLOOD RIGHT FOREARM  Final   Special Requests   Final    BOTTLES DRAWN AEROBIC AND ANAEROBIC Blood Culture adequate volume   Culture  Setup Time   Final    GRAM POSITIVE COCCI IN CLUSTERS ANAEROBIC BOTTLE ONLY CRITICAL VALUE NOTED.  VALUE IS CONSISTENT WITH PREVIOUSLY REPORTED AND CALLED VALUE. Performed at [facility], [address] [place], [place] [address]    Culture METHICILLIN RESISTANT STAPHYLOCOCCUS AUREUS (A)  Final   Report Status 08/04/2017 FINAL  Final   Organism ID, Bacteria METHICILLIN RESISTANT STAPHYLOCOCCUS AUREUS  Final      Susceptibility   Methicillin resistant staphylococcus aureus - MIC*    CIPROFLOXACIN >=8 RESISTANT Resistant     ERYTHROMYCIN >=8 RESISTANT Resistant     GENTAMICIN <=0.5 SENSITIVE Sensitive     OXACILLIN >=4 RESISTANT Resistant     TETRACYCLINE <=1 SENSITIVE Sensitive     VANCOMYCIN 1 SENSITIVE Sensitive     TRIMETH/SULFA <=10 SENSITIVE Sensitive     CLINDAMYCIN <=0.25 SENSITIVE Sensitive     RIFAMPIN <=0.5 SENSITIVE Sensitive     Inducible Clindamycin NEGATIVE Sensitive     * METHICILLIN RESISTANT STAPHYLOCOCCUS AUREUS  Culture, blood (Routine X 2) w Reflex to ID  Panel      Status: Abnormal   Collection Time: 07/31/17  9:37 AM  Result Value Ref Range Status   Specimen Description RIGHT ANTECUBITAL  Final   Special Requests IN PEDIATRIC BOTTLE Blood Culture adequate volume  Final   Culture  Setup Time   Final    GRAM POSITIVE COCCI IN CLUSTERS IN PEDIATRIC BOTTLE CRITICAL VALUE NOTED.  VALUE IS CONSISTENT WITH PREVIOUSLY REPORTED AND CALLED VALUE.    Culture (A)  Final    STAPHYLOCOCCUS AUREUS SUSCEPTIBILITIES PERFORMED ON PREVIOUS CULTURE WITHIN THE LAST 5 DAYS. Performed at [facility], [address] [place], [place] [address]    Report Status 08/04/2017 FINAL  Final  Culture, blood (Routine X 2) w Reflex to ID Panel     Status: None   Collection Time: 08/03/17 12:32 PM  Result Value Ref Range Status   Specimen Description BLOOD LEFT HAND  Final   Special Requests   Final    BOTTLES DRAWN AEROBIC AND ANAEROBIC Blood Culture adequate volume   Culture NO GROWTH 5 DAYS  Final   Report Status 08/08/2017 FINAL  Final  Culture, blood (Routine X 2) w Reflex to ID Panel     Status: None   Collection Time: 08/03/17 12:40 PM  Result Value Ref Range Status   Specimen Description BLOOD LEFT HAND  Final   Special Requests   Final    BOTTLES DRAWN AEROBIC AND ANAEROBIC Blood Culture results may not be optimal due to an inadequate volume of blood received in culture bottles   Culture NO GROWTH 5 DAYS  Final   Report Status 08/08/2017 FINAL  Final    Studies/Results: No results found.    Assessment/Plan:  INTERVAL HISTORY:   Hospitalist, Dr. Maryfrances Bunnell alerted me to MRI C spine findings and to CT surgery plans  Active Problems:   Septic embolism (HCC)    Reginald [name] is a 56 y.o. 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 (they are finally without growth). 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  Persistently positive blood cultures are in known reason for need for valve replacement and I think the likely explanation why he was persistently bacteremic despite being on supratherapeutic levels of vancomycin and the organism showing no evidence of resistance to vancomycin.     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  Please call us back when he is ready to go to the OR so we can see  him postoperatively and arrange for followup.  Dr. Ninetta LightsHatcher will be on call this weekend the 22nd and 23rd  Dr. Orvan Falconerampbell will be here on the 24th, 25th, 26th,   Dr. Drue SecondSnider on the 27th, 28th, 29th  And  I will be back on the 30, 31 and 1st.     LOS: 0 days   Acey LavCornelius Van Dam 08/10/2017, 4:40 PM

## 2017-08-10 NOTE — Progress Notes (Signed)
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 Pt developed agitation on 12/5 thought to be ETOH withdrawal. MRI on 12/5 done and showed multiple areas bilaterally of septic emboli. Pt with severe encephalopathy and respiratory failure and was intubated on 12/6. Pt extubated on 12/10. PMH - DM, ETOH, obesity. He was transferred to [facility] 08/01/17. Underwent dental extraction 08/07/17 in preparation for mitral valve replacement.    PT Comments    Pt performed increased gait training and presents with increased motivation during session.  Pt requires min assist grossly for overall mobility.  Cues for safety, pacing and endurance during session.  Plan next session for continued mobility and gait training to build strength in prep for cardiac surgery.     Follow Up Recommendations  CIR     Equipment Recommendations  Rolling walker with 5" wheels    Recommendations for Other Services OT consult     Precautions / Restrictions Precautions Precautions: Fall Restrictions Weight Bearing Restrictions: No    Mobility  Bed Mobility Overal bed mobility: Needs Assistance Bed Mobility: Rolling;Sidelying to Sit Rolling: Min guard Sidelying to sit: Min assist       General bed mobility comments: Min assist to elevate trunk into sitting edge of bed.  Pt able to follow commands better to roll and elevate trunk into sitting edge of bed.    Transfers Overall transfer level: Needs assistance Equipment used: Rolling walker (2 wheeled) Transfers: Sit to/from Stand Sit to Stand: Min assist         General transfer comment: VC for hand placement and safety to and from seated surface.  Pt reaching for RW for support.  Pt performed sit to stand x2 from bed and x1 from  recliner chair.   Ambulation/Gait Ambulation/Gait assistance: Min assist Ambulation Distance (Feet): 50 Feet(x2 trials.  ) Assistive device: Rolling walker (2 wheeled) Gait Pattern/deviations: Step-through pattern;Decreased step length - right;Decreased step length - left;Shuffle;Trunk flexed Gait velocity: decr Gait velocity interpretation: Below normal speed for age/gender General Gait Details: Cues for forward gaze, upper trunk control and sequencing and increasing stride length.  Pt on 4L O2 with O2 sats 89%-90%.  Pt with increased DOE.  Rest break between gait trial with cues for pursed lip breathing.     Stairs            Wheelchair Mobility    Modified Rankin (Stroke Patients Only)       Balance Overall balance assessment: Needs assistance   Sitting balance-Leahy Scale: Good       Standing balance-Leahy Scale: Poor Standing balance comment: Pt able to stand and use urinal with min assistance.                              Cognition Arousal/Alertness: Awake/alert Behavior During Therapy: WFL for tasks assessed/performed Overall Cognitive Status: Within Functional Limits for tasks assessed                                        Exercises      General Comments        Pertinent Vitals/Pain Pain Assessment: 0-10 Pain Score: 8  Pain Location: neck  Pain Descriptors / Indicators: Aching Pain Intervention(s): Monitored during session;Repositioned;Patient requesting pain meds-RN notified    Home Living                      Prior Function            PT Goals (current goals can now be found in the care plan section) Acute Rehab PT Goals Patient Stated Goal: go home Potential to Achieve Goals: Good Progress towards PT goals: Progressing toward goals    Frequency    Min 3X/week      PT Plan Current plan remains appropriate    Co-evaluation              AM-PAC PT "6 Clicks" Daily Activity  Outcome  Measure  Difficulty turning over in bed (including adjusting bedclothes, sheets and blankets)?: Unable Difficulty moving from lying on back to sitting on the side of the bed? : Unable Difficulty sitting down on and standing up from a chair with arms (e.g., wheelchair, bedside commode, etc,.)?: Unable Help needed moving to and from a bed to chair (including a wheelchair)?: A Little Help needed walking in hospital room?: A Little Help needed climbing 3-5 steps with a railing? : A Lot 6 Click Score: 11    End of Session Equipment Utilized During Treatment: Gait belt Activity Tolerance: Patient limited by fatigue Patient left: in chair;with call bell/phone within reach;with family/visitor present   PT Visit Diagnosis: Unsteadiness on feet (R26.81);Other abnormalities of gait and mobility (R26.89);Muscle weakness (generalized) (M62.81)     Time: [id number] PT Time Calculation (min) (ACUTE ONLY): 36 min  Charges:  $Gait Training: 8-22 mins $Therapeutic Activity: 8-22 mins                    G Codes:       Joycelyn Ruaimee Trenice Mesa, PTA pager [phone]    Florestine Aversimee J Kaleeah Gingerich 08/10/2017, 3:16 PM

## 2017-08-10 NOTE — Progress Notes (Signed)
Progress Note  Patient Name: Reginald [name] Date of Encounter: 08/10/2017  Primary Cardiologist: Chrystie NoseKenneth C Hilty, MD   Subjective   Feels more energetic than yesterday and does appear more alert and involved in his own care.  However, yesterday only was able to walk for 10 feet without assistance, will have to do much better in order to have bowel surgery next week.  Problem was leg fatigue, not dyspneic.  Inpatient Medications    Scheduled Meds: . chlorhexidine  15 mL Mouth Rinse BID  . feeding supplement (ENSURE ENLIVE)  237 mL Oral BID BM  . folic acid  1 mg Oral Daily  . furosemide  40 mg Intravenous Q8H  . insulin aspart  0-15 Units Subcutaneous TID WC  . insulin aspart  0-5 Units Subcutaneous QHS  . insulin glargine  7 Units Subcutaneous Daily  . lidocaine  1 patch Transdermal Q24H  . metoprolol tartrate  25 mg Oral BID  . potassium chloride  20 mEq Oral BID  . sodium chloride flush  3 mL Intravenous Q12H  . thiamine  100 mg Oral Daily   Continuous Infusions: . sodium chloride    . ceFTAROline (TEFLARO) IV Stopped (08/10/17 0156)  . DAPTOmycin (CUBICIN)  IV Stopped (08/09/17 1507)  . lactated ringers     PRN Meds: sodium chloride, acetaminophen, hydrALAZINE, HYDROmorphone (DILAUDID) injection, ipratropium-albuterol, [DISCONTINUED] ondansetron **OR** ondansetron (ZOFRAN) IV, sodium chloride flush   Vital Signs    Vitals:   08/09/17 1624 08/09/17 1945 08/10/17 0000 08/10/17 0400  BP:  121/77 (!) 106/59 128/71  Pulse: 90 86 88 80  Resp: (!) 22 (!) 22 18 18   Temp: 99.8 F (37.7 C) 100.1 F (37.8 C) 98.4 F (36.9 C) 99 F (37.2 C)  TempSrc: Oral Oral Oral Oral  SpO2: 97% 94% 95% 90%  Weight:    237 lb 6.4 oz (107.7 kg)  Height:        Intake/Output Summary (Last 24 hours) at 08/10/2017 0948 Last data filed at 08/10/2017 0659 Gross per 24 hour  Intake 240 ml  Output 800 ml  Net -560 ml   Filed Weights   08/08/17 0621 08/09/17 0607 08/10/17 0400    Weight: 231 lb (104.8 kg) 233 lb 6.4 oz (105.9 kg) 237 lb 6.4 oz (107.7 kg)    Telemetry    Normal sinus rhythm- Personally Reviewed  ECG    No new tracing- Personally Reviewed  Physical Exam  Obese GEN: No acute distress.   Neck: No JVD Cardiac: RRR, 3/6 loud holosystolic murmur heard at the apex radiating towards the axilla as well as the base.  No diastolic murmurs, rubs, or gallops.  Respiratory: Clear to auscultation bilaterally. GI: Soft, nontender, non-distended  MS: No edema; No deformity. Neuro:  Nonfocal  Psych: Normal affect   Labs    Chemistry Recent Labs  Lab 08/08/17 0407 08/09/17 0538 08/10/17 0413  NA 134* 132* 130*  K 3.4* 4.2 3.5  CL 100* 95* 95*  CO2 27 28 28   GLUCOSE 136* 169* 212*  BUN 25* 16 13  CREATININE 0.93 0.84 0.71  CALCIUM 8.5* 8.6* 8.5*  GFRNONAA >60 >60 >60  GFRAA >60 >60 >60  ANIONGAP 7 9 7      Hematology Recent Labs  Lab 08/08/17 0407 08/09/17 0538 08/10/17 0413  WBC 7.3 6.5 5.4  RBC 2.86* 2.84* 2.69*  HGB 8.6* 8.6* 8.2*  HCT 27.0* 26.4* 24.9*  MCV 94.4 93.0 92.6  MCH 30.1 30.3 30.5  MCHC 31.9  32.6 32.9  RDW 14.2 13.8 14.5  PLT 295 294 280    Cardiac EnzymesNo results for input(s): TROPONINI in the last 168 hours. No results for input(s): TROPIPOC in the last 168 hours.   BNPNo results for input(s): BNP, PROBNP in the last 168 hours.   DDimer No results for input(s): DDIMER in the last 168 hours.   Radiology    Mr Cervical Spine W Wo Contrast  Addendum Date: 08/08/2017   ADDENDUM REPORT: 08/08/2017 15:17 ADDENDUM: These results were called by telephone at the time of interpretation on 08/08/2017 at 3:15 pm to the patient's nurse, Tressia Miners,, who verbally acknowledged these results. Electronically Signed   By: Drusilla Kanner M.D.   On: 08/08/2017 15:17   Result Date: 08/08/2017 CLINICAL DATA:  Neck pain and patient with bacterial endocarditis. Question infection. EXAM: MRI CERVICAL SPINE WITHOUT AND WITH  CONTRAST TECHNIQUE: Multiplanar and multiecho pulse sequences of the cervical spine, to include the craniocervical junction and cervicothoracic junction, were obtained without and with intravenous contrast. CONTRAST:  20 ml MULTIHANCE GADOBENATE DIMEGLUMINE 529 MG/ML IV SOLN COMPARISON:  CT angiogram of the neck 07/28/2017. FINDINGS: Alignment: There is trace retrolisthesis of C4 on C5. Alignment is otherwise maintained. Vertebrae: Abnormal edema and enhancement are seen in the C4-5 endplates consistent with discitis and osteomyelitis. No epidural abscess is identified. The patient also has abnormal edema and enhancement in the odontoid process and in the anterior arch of C1. Marrow edema and enhancement are also identified at the articulation of the occipital condyles and lateral masses of C1 which appears worse on the right where there is an associated small effusion highly suspicious for septic joint. Cord: Normal signal throughout. Posterior Fossa, vertebral arteries, paraspinal tissues: There is a small fluid collection along the right lateral mass of C2 measuring approximately 1.8 cm AP by 0.3 cm transverse by 0.9 cm craniocaudal worrisome for abscess. Edema and enhancement are seen and posterior paraspinous musculature, particularly the semispinalis muscle on the right where there is an abscess measuring 1.2 cm AP by 0.3 cm transverse at the level of the C2-3 disc interspace. This abscess likely communicates with the abscess described above. Although no epidural abscess is identified, there is epidural enhancement posterior to C2 and at C4-5 and extensive prevertebral soft tissue edema and enhancement. Disc levels: Patient motion somewhat limits evaluation of the disc interspaces. C1-2:  The central canal is open. C2-3:  Mild disc bulge without stenosis. C3-4:  Minimal disc bulge without stenosis. C4-5: Shallow disc bulge effaces the ventral thecal sac. Uncovertebral disease causes mild to moderate foraminal  narrowing, worse on the left. C5-6: There is a shallow disc bulge, uncovertebral disease and facet arthropathy. The ventral thecal sac appears effaced. Severe left and moderate right foraminal narrowing is identified. C6-7: Shallow disc bulge without central canal stenosis. Mild to moderate left foraminal narrowing is seen. The right foramen is open. C7-T1:  Negative. IMPRESSION: Findings consistent with osteomyelitis in the odontoid process of C2 and anterior arch of C1. Negative for epidural abscess. Marrow edema and enhancement about the articulation of the occipital condyles and lateral masses of C1, worse on the right, consistent osteomyelitis and septic joint. As described above, there is a small abscess along the right lateral mass of C2 in the paraspinous soft tissues. Findings consistent with discitis and osteomyelitis at C4-5. Negative for epidural abscess. Edema and enhancement in paraspinous musculature consistent with myositis. Changes are worse on the right where there is a small abscess in  semispinalis muscle at the level of the C2-3 disc interspace. This abscess likely communicates with the abscess along the right lateral mass of C2 described above. Electronically Signed: By: Drusilla Kannerhomas  Dalessio M.D. On: 08/08/2017 15:11    Cardiac Studies   Cardiac catheterization 08/02/2017:  Mid RCA lesion is 40% stenosed.  Ost 2nd Mrg to 2nd Mrg lesion is 20% stenosed.  Hemodynamic findings consistent with moderate pulmonary hypertension.  1. Mild non-obstructive CAD  TEE 07/27/2017: Study Conclusions  - Left ventricle: Normal LV size. Moderate LVH. Systolic function was normal. The estimated ejection fraction was in the range of 60% to 65%. Wall motion was normal; there were no regional wall motion abnormalities. - Aortic valve: Trileaflet. Small echogenic mass at the tip of the non-coronary cusp. lambl&'s excrescences. Trivial AI. - Aorta: Grade 3 mobile atheroma of the proximal  aortic arch. - Mitral valve: Large 2-3 cm mass of the A3 segment of the anterior leaflet, prolapsing across the posterior leaflet. There is a distinct posterior jet which is centrally directed of severe regurgitation, suggestive of perforation. There is dropout seen through the vegetation or in the area of the P3 scallop - not entirely clear, but concerning for possible perforation. There is vegetation to a lesser extent on the posterior leaflet tip. Effective regurgitant orifice (PISA): 1.01 cm^2. Regurgitant volume (PISA): 155 ml. - Left atrium: The atrium was dilated. No evidence of thrombus in the atrial cavity or appendage. - Right atrium: No evidence of thrombus in the atrial cavity or appendage. - Atrial septum: No defect or patent foramen ovale was identified. - Pulmonic valve: No evidence of vegetation.  Impressions:  - Large mass of the A3 segment of the anterior mitral leaflet, prolapsing over the posterior leaflet. Posteriorly located central regurgitant jet suggestive of possible perforation - in the region of the P3 segment. Endocarditis of the posterior leaflet tip is noted. There is a possible small mass on the non-coronary cusp of the aortic valve with trivial AI - this could be resolved endocarditis or sclerosis as well    Patient Profile     56 y.o. 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  Assessment & Plan    1. CHF: due to MR,probably euvolemic.  Weight measurements remain volatile and treat "dry weight" is uncertain. 2. MRdue to staph endocarditis: tentative plan for MV replacementnext Monday withDr. Donata Clayvan Trigt, but our surgeon raises valid concerns about his degree of deconditioning. Conversely, I remain concerned about the risk of new embolization with his highly mobile  and large vegetation (although risk lower after 2 weeks of ABx and successful blood culture sterilization).  T max 100.43F.  White blood cell count remains completely normal.  Could have atelectasis from lying in bed so much. 3. HTN: controlled. 4. Deconditioning: PT ordered, need to get him ambulatory by time of surgery.    For questions or updates, please contact [facility] Please consult [email or web address] for contact info under Cardiology/STEMI.      Signed, Thurmon FairMihai Shiquan Mathieu, MD  08/10/2017, 9:48 AM

## 2017-08-10 NOTE — PMR Pre-admission (Signed)
PMR Admission Coordinator Pre-Admission Assessment  Patient: Reginald [name] is an 56 y.o., male MRN: [id number] DOB: 07/06/1961 Height: 5\' 9"  (175.3 cm) Weight: 107.7 kg (237 lb 6.4 oz)              Insurance Information Self pay - no insurance  Medicaid Application Date:        Case Manager:   Disability Application Date:        Case Worker:    Emergency Conservator, museum/galleryContact Information Contact Information    Name Relation Home Work [place]   Pavey, Edison SimonJoseph S Father [phone]     Claudean KindsButcher, doris Significant other   [phone]     Current Medical History  Patient Admitting Diagnosis: Functional and cognitive deficits secondary to septic emboli to the brain/debility  History of Present Illness: A 56 y.o.malewith history of diastolic CHF, COPD, T2DM, alcohol abuse who [facility] with hypoxia and found to have MRSA bacteremia with endocarditis. Hospital course significant for acute on chronic CHF and alcohol withdrawal with confusion with agitation. He was found to have 4.5 cm AAA as well as cervical DDD. [facility]. MRI brain done revealing Multiple small areas of restricted diffusion bilaterally most compatible with septic emboli. Dr. Drue SecondSnider consulted for input and recommended continuing antibiotics as well as TEE for work up.  TEE done revealing large mass on anterior mitarl leaflet with central regurgitation jet suggestive for possible perforation as well as possible small mass on AV (could be due to resolved endocarditis).   He had issues with agitation requiring precedex but developed obtundation requiring intubation for airway protection on 12/6 and extubated 07/31/2017. Repeat BC 12/7 and 12/10 positive and CT abdomen/chest showed splenic emboli and possible PNA. Rifampin added to ceftaroline/daptomycin on 12/12. Neurology consulted for input and recommended CIWA protocol and treating infection--no ASA needed. CTA  head was negative for large or medium vessel stenosis or occlusion. Cardiology following to help manage CHF and he underwent diagnosticcardiac cath 08/01/2017 . MRSA PCR screening positive and continues on contact precautions.  Underwent multiple dental extraction of necrotic teeth 08/07/2017 in preparation for mitral valve replacement with bioprosthetic valve which had initially been scheduled for 08/14/2017 but surgeon raises valid concerns about patient's degree of conditioning and at this time advised to hold on surgical procedure until patient was able to tolerate... Follow-up per infectious disease currently is rifampin was discontinued 08/09/2017 and continue with daptomycin and teflaro 6 weeks total and latest blood culture showed no growth. Physical and occupational therapy ongoing.   Patient to be admitted for a comprehensive inpatient rehabilitation program.   Total: 6=NIH  Past Medical History  Past Medical History:  Diagnosis Date  . Alcohol abuse   . Diabetes mellitus type 2 in obese (HCC)   . Neck pain   . Tobacco abuse     Family History  family history includes Hypertension in his father and mother.  Prior Rehab/Hospitalizations: No previous rehab.  Has the patient had major surgery during 100 days prior to admission? No  Current Medications   Current Facility-Administered Medications:  .  0.9 %  sodium chloride infusion, 250 mL, Intravenous, PRN, Kathleene HazelMcAlhany, Christopher D, MD .  acetaminophen (TYLENOL) tablet 650 mg, 650 mg, Oral, Q6H PRN, Alberteen Samanford, Christopher P, MD, 650 mg at 08/10/17 1324 .  ceftaroline (TEFLARO) 600 mg in sodium chloride 0.9 % 250 mL IVPB, 600 mg, Intravenous, Q12H, Judyann MunsonSnider, Cynthia, MD, Stopped at 08/10/17 1100 .  chlorhexidine (  PERIDEX) 0.12 % solution 15 mL, 15 mL, Mouth Rinse, BID, McQuaid, Douglas B, MD, 15 mL at 08/10/17 0953 .  DAPTOmycin (CUBICIN) 1,000 mg in sodium chloride 0.9 % IVPB, 1,000 mg, Intravenous, Q24H, Bertram Millard, RPH,  Last Rate: 240 mL/hr at 08/10/17 1424, 1,000 mg at 08/10/17 1424 .  feeding supplement (ENSURE ENLIVE) (ENSURE ENLIVE) liquid 237 mL, 237 mL, Oral, BID BM, Hall, Carole N, DO, 237 mL at 08/10/17 1033 .  folic acid (FOLVITE) tablet 1 mg, 1 mg, Oral, Daily, Danford, Earl Lites, MD, 1 mg at 08/10/17 0953 .  furosemide (LASIX) injection 40 mg, 40 mg, Intravenous, Q8H, Lars Masson, MD, 40 mg at 08/10/17 1324 .  hydrALAZINE (APRESOLINE) injection 10-20 mg, 10-20 mg, Intravenous, Q4H PRN, Max Fickle B, MD, 20 mg at 08/02/17 0001 .  HYDROmorphone (DILAUDID) injection 0.5 mg, 0.5 mg, Intravenous, Q4H PRN, Dow Adolph N, DO, 0.5 mg at 08/10/17 0827 .  insulin aspart (novoLOG) injection 0-15 Units, 0-15 Units, Subcutaneous, TID WC, Danford, Earl Lites, MD, 5 Units at 08/10/17 1158 .  insulin aspart (novoLOG) injection 0-5 Units, 0-5 Units, Subcutaneous, QHS, Danford, Christopher P, MD .  insulin glargine (LANTUS) injection 7 Units, 7 Units, Subcutaneous, Daily, Danford, Earl Lites, MD, 7 Units at 08/10/17 [phone] .  ipratropium-albuterol (DUONEB) 0.5-2.5 (3) MG/3ML nebulizer solution 3 mL, 3 mL, Nebulization, Q4H PRN, Smith, Rondell A, MD .  lactated ringers infusion, , Intravenous, Continuous, Cindra Eves F, DDS .  lidocaine (LIDODERM) 5 % 1 patch, 1 patch, Transdermal, Q24H, Darlin Drop, DO, 1 patch at 08/10/17 [phone] .  metoprolol tartrate (LOPRESSOR) tablet 25 mg, 25 mg, Oral, BID, Kirby-Graham, Beather Arbour, NP, 25 mg at 08/10/17 0953 .  [DISCONTINUED] ondansetron (ZOFRAN) tablet 4 mg, 4 mg, Oral, Q6H PRN **OR** ondansetron (ZOFRAN) injection 4 mg, 4 mg, Intravenous, Q6H PRN, Smith, Rondell A, MD .  potassium chloride 20 MEQ/15ML (10%) solution 20 mEq, 20 mEq, Oral, BID, Lars Masson, MD, 20 mEq at 08/10/17 0953 .  sodium chloride flush (NS) 0.9 % injection 3 mL, 3 mL, Intravenous, Q12H, Kathleene Hazel, MD, 3 mL at 08/09/17 0932 .  sodium chloride flush (NS) 0.9 %  injection 3 mL, 3 mL, Intravenous, PRN, Kathleene Hazel, MD .  thiamine (VITAMIN B-1) tablet 100 mg, 100 mg, Oral, Daily, 100 mg at 08/10/17 0953 **OR** [DISCONTINUED] thiamine (B-1) injection 100 mg, 100 mg, Intravenous, Daily, Roslynn Amble, MD, 100 mg at 08/04/17 5409  Patients Current Diet: DIET DYS 3 Room service appropriate? Yes; Fluid consistency: Thin  Precautions / Restrictions Precautions Precautions: Fall Restrictions Weight Bearing Restrictions: No   Has the patient had 2 or more falls or a fall with injury in the past year?No  Prior Activity Level Community (5-7x/wk): Went out daily.  Worked side jobs fixing things.  Was driving.  Home Assistive Devices / Equipment Home Assistive Devices/Equipment: None Home Equipment: None  Prior Device Use: Indicate devices/aids used by the patient prior to current illness, exacerbation or injury? None  Prior Functional Level Prior Function Level of Independence: Independent  Self Care: Did the patient need help bathing, dressing, using the toilet or eating?  Independent  Indoor Mobility: Did the patient need assistance with walking from room to room (with or without device)? Independent  Stairs: Did the patient need assistance with internal or external stairs (with or without device)? Independent  Functional Cognition: Did the patient need help planning regular tasks such as shopping or remembering to  take medications? Independent  Current Functional Level Cognition  Overall Cognitive Status: Impaired/Different from baseline Current Attention Level: Selective Orientation Level: Oriented X4 Following Commands: Follows one step commands consistently, Follows one step commands with increased time Safety/Judgement: Decreased awareness of safety, Decreased awareness of deficits General Comments: Some increased time for processing and decreased attention this session.     Extremity Assessment (includes  Sensation/Coordination)  Upper Extremity Assessment: Generalized weakness  Lower Extremity Assessment: Defer to PT evaluation    ADLs  Overall ADL's : Needs assistance/impaired Grooming: Wash/dry hands, Wash/dry face, Standing, Minimal assistance Grooming Details (indicate cue type and reason): Poor balance, unsteady Upper Body Bathing: Min guard, Sitting Lower Body Bathing: Maximal assistance Upper Body Dressing : Min guard, Sitting Lower Body Dressing: Maximal assistance Toilet Transfer: Ambulation, RW, Comfort height toilet, Moderate assistance, +2 for safety/equipment, Cueing for safety Toileting- Clothing Manipulation and Hygiene: Moderate assistance, Sit to/from stand Tub/ Shower Transfer: Moderate assistance, +2 for safety/equipment, Rolling walker, Ambulation, 3 in 1, Grab bars Functional mobility during ADLs: Moderate assistance, +2 for safety/equipment, Rolling walker, Cueing for safety General ADL Comments: verbal cues for correct hand placement, pt encouraged to sit up in recliner vs returning to bed as he requested    Mobility  Overal bed mobility: Needs Assistance Bed Mobility: Supine to Sit Supine to sit: Min assist Sit to supine: +2 for physical assistance, Min assist General bed mobility comments: min assist to bring trunk up to sit EOB     Transfers  Overall transfer level: Needs assistance Equipment used: Rolling walker (2 wheeled) Transfers: Sit to/from Stand Sit to Stand: Min assist General transfer comment: VC for hand placement and safety     Ambulation / Gait / Stairs / Wheelchair Mobility  Ambulation/Gait Ambulation/Gait assistance: Hydrographic surveyor (Feet): 10 Feet Assistive device: Rolling walker (2 wheeled) Gait Pattern/deviations: Step-through pattern, Decreased step length - right, Decreased step length - left, Shuffle, Trunk flexed General Gait Details: min guard, +2 for equipment and chair follow  Gait velocity: decr Gait velocity  interpretation: Below normal speed for age/gender    Posture / Balance Balance Overall balance assessment: Needs assistance Sitting-balance support: Bilateral upper extremity supported, Feet supported Sitting balance-Leahy Scale: Good Standing balance support: Bilateral upper extremity supported, During functional activity Standing balance-Leahy Scale: Fair Standing balance comment: walker and min assist for static standing    Special needs/care consideration BiPAP/CPAP Diagnosed with sleep apnea, but could not use CPAP  CPM No Continuous Drip IV KVO Dialysis No       Life Vest No Oxygen Yes, currently on 02 5L [place], but was not on 02 at home Special Bed No Trach Size No Wound Vac (area) No     Skin NO                               Bowel mgmt: Last BM 08/07/17 Bladder mgmt: Using urinal Diabetic mgmt Yes, on oral medication at home Contact Precautions:  Yes, MRSA   Previous Home Environment Living Arrangements: Spouse/significant other Available Help at Discharge: Family Type of Home: House Home Layout: One level Home Access: Level entry Bathroom Shower/Tub: Tub/shower unit, Health visitor: Standard Home Care Services: No  Discharge Living Setting Plans for Discharge Living Setting: Alone, Other (Comment)(Lives alone in a camper.) Type of Home at Discharge: Other (Comment)(Camper) Discharge Home Layout: One level Discharge Home Access: Stairs to enter Entrance Stairs-Number of Steps: 3 Does  the patient have any problems obtaining your medications?: No  Social/Family/Support Systems Patient Roles: Parent, Other (Comment)(Has a girlfriend and 4 children "who claim him" per patient.) Contact Information: Claudean Kindsoris Butcher - GF Anticipated Caregiver: Doris Anticipated Caregiver's Contact Information: Everardo AllDoris - GF - [phone] Ability/Limitations of Caregiver: Tyler AasDoris does not live with patient.  She can assist some after discharge. Caregiver Availability:  Intermittent Discharge Plan Discussed with Primary Caregiver: Yes Is Caregiver In Agreement with Plan?: Yes Does Caregiver/Family have Issues with Lodging/Transportation while Pt is in Rehab?: No  Goals/Additional Needs Patient/Family Goal for Rehab: PT/OT/SLP mod I and supervision goals Expected length of stay: 12-17 days Cultural Considerations: None Dietary Needs: Dys 3, thin liquids Equipment Needs: TBD Special Service Needs: Needs MV replacement by Dr. Morton PetersVan Tright in the next 7-10 days as patient becomes stronger and ambulatory. Pt/Family Agrees to Admission and willing to participate: Yes Program Orientation Provided & Reviewed with Pt/Caregiver Including Roles  & Responsibilities: Yes  Decrease burden of Care through IP rehab admission: N/A  Possible need for SNF placement upon discharge: Plans are to discharge to acute care for MV replacement once patient is stronger per Dr. Donata ClayVan Trigt  Patient Condition: This patient's medical and functional status has changed since the consult dated: 08/02/17 in which the Rehabilitation Physician determined and documented that the patient's condition is appropriate for intensive rehabilitative care in an inpatient rehabilitation facility. See "History of Present Illness" (above) for medical update. Functional changes are:  Currently requiring min assist for transfers and minguard assist to ambulate 10 feet RW. Patient's medical and functional status update has been discussed with the Rehabilitation physician and patient remains appropriate for inpatient rehabilitation. Will admit to inpatient rehab today.  Preadmission Screen Completed By:  Trish MageLogue, Azara Gemme M, 08/10/2017 2:26 PM ______________________________________________________________________   Discussed status with Dr. Riley KillSwartz on 08/10/17 at 1426 and received telephone approval for admission today.  Admission Coordinator:  Trish MageLogue, Eniola Cerullo M, time 1426/Date 08/10/17

## 2017-08-10 NOTE — H&P (Signed)
Physical Medicine and Rehabilitation Admission H&P    Chief complaint: Weakness  HPI: Reginald [name] is a 56 y.o. male with history of diastolic CHF, COPD, T2DM,  alcohol abuse who was admitted to [facility] with hypoxia and found to have MRSA bacteremia with endocarditis. Hospital course significant for acute on chronic CHF and alcohol withdrawal with  confusion with agitation.  He was found to have 4.5 cm AAA as well as cervical DDD.  He was transferred to [facility] for evaluation and treatment. MRI brain done revealing  Multiple small areas of restricted diffusion bilaterally most compatible with septic emboli. Dr. Drue Second consulted for input and recommended continuing antibiotics as well as TEE for work up.   TEE done revealing large mass on anterior mitral leaflet with central regurgitation jet suggestive for possible perforation as well as possible small mass on AV (could be due to resolved endocarditis).   He had issues with agitation requiring precedex but developed obtundation requiring intubation for airway protection on 12/6 and extubated 07/31/2017. Repeat BC 12/7 and 12/10 positive and CT abdomen/chest showed splenic emboli and possible PNA.   Rifampin added to ceftaroline/daptomycin on 12/12.   Neurology consulted for input and recommended CIWA protocol and treating infection--no ASA needed. CTA head was negative for large or medium vessel stenosis or occlusion. Cardiology following to help manage CHF and he underwent diagnostic cardiac cath  08/01/2017 .  MRSA PCR screening positive and continues on contact precautions.   Underwent multiple dental extraction of necrotic teeth 08/07/2017 in preparation for mitral valve replacement with bioprosthetic valve which had initially been scheduled for 08/14/2017 but surgeon raises valid concerns about patient's degree of conditioning and at this time advised to hold on surgical procedure until patient was able to tolerate... Follow-up per  infectious disease currently is rifampin was discontinued 08/09/2017 and continue with daptomycin and teflaro 6 weeks total and latest blood culture showed no growth. Physical occupational therapy ongoing patient was admitted for a comprehensive rehabilitation program    Review of Systems  Constitutional: Negative for chills and fever.  HENT: Negative for hearing loss.   Eyes: Negative for blurred vision and double vision.  Respiratory: Positive for shortness of breath.   Cardiovascular: Positive for leg swelling. Negative for chest pain and palpitations.  Gastrointestinal: Positive for constipation. Negative for nausea.  Genitourinary: Negative for dysuria and hematuria.  Musculoskeletal: Positive for myalgias.  Neurological: Positive for dizziness, weakness and headaches. Negative for seizures.  All other systems reviewed and are negative.  Past Medical History:  Diagnosis Date  . Acute osteomyelitis of cervical spine (HCC) 08/10/2017  . Alcohol abuse   . Diabetes mellitus type 2 in obese (HCC)   . Neck pain   . 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 Tobacco abuse    Past Surgical History:  Procedure Laterality Date  . HERNIA REPAIR  2007  . MULTIPLE EXTRACTIONS WITH ALVEOLOPLASTY N/A 08/07/2017   Procedure: Extraction of tooth #'s 6,11,12,14,19-29 and 32 with alveoloplasty;  Surgeon: Charlynne Pander, DDS;  Location: [facility] OR;  Service: Oral Surgery;  Laterality: N/A;  . RIGHT/LEFT HEART CATH AND CORONARY ANGIOGRAPHY N/A 08/02/2017   Procedure: RIGHT/LEFT HEART CATH AND CORONARY ANGIOGRAPHY;  Surgeon: Kathleene Hazel, MD;  Location: [facility] INVASIVE CV LAB;  Service: Cardiovascular;  Laterality: N/A;  . Tonsillectomy /adnoidectomy     as aa child   Family History  Problem Relation Age of Onset  . Hypertension Mother   . Hypertension Father  Social History:  reports that he has been smoking cigarettes.  he has never used smokeless tobacco. He reports that he drinks  about 2.4 - 3.0 oz of alcohol per week. He reports that he does not use drugs. Allergies:  Allergies  Allergen Reactions  . Morphine And Related    Medications Prior to Admission  Medication Sig Dispense Refill  . acetaminophen (TYLENOL) 325 MG tablet Take 2 tablets (650 mg total) by mouth every 6 (six) hours as needed for mild pain, fever or headache.    Marland Kitchen DAPTOmycin 1,000 mg in sodium chloride 0.9 % 100 mL Inject 1,000 mg into the vein daily.    . feeding supplement, ENSURE ENLIVE, (ENSURE ENLIVE) LIQD Take 237 mLs by mouth 2 (two) times daily between meals. 237 mL 12  . [START ON 08/11/2017] folic acid (FOLVITE) 1 MG tablet Take 1 tablet (1 mg total) by mouth daily.    . furosemide (LASIX) 40 MG tablet Take 1 tablet (40 mg total) by mouth every 8 (eight) hours. 30 tablet 11  . insulin aspart (NOVOLOG) 100 UNIT/ML injection Inject 0-15 Units into the skin 3 (three) times daily with meals. 10 mL 11  . insulin aspart (NOVOLOG) 100 UNIT/ML injection Inject 0-5 Units into the skin at bedtime. 10 mL 11  . [START ON 08/11/2017] insulin glargine (LANTUS) 100 UNIT/ML injection Inject 0.12 mLs (12 Units total) into the skin daily. 10 mL 11  . metoprolol tartrate (LOPRESSOR) 25 MG tablet Take 1 tablet (25 mg total) by mouth 2 (two) times daily.    . ondansetron (ZOFRAN) 4 MG/2ML SOLN injection Inject 2 mLs (4 mg total) into the vein every 6 (six) hours as needed for nausea. 2 mL 0  . oxyCODONE (ROXICODONE) 5 MG immediate release tablet Take 1 tablet (5 mg total) by mouth every 8 (eight) hours as needed. 20 tablet 0  . potassium chloride 20 MEQ/15ML (10%) SOLN Take 15 mLs (20 mEq total) by mouth 2 (two) times daily. 900 mL 0  . [START ON 08/11/2017] thiamine 100 MG tablet Take 1 tablet (100 mg total) by mouth daily.      Drug Regimen Review Drug regimen was reviewed and remains appropriate with no significant issues identified  Home:     Functional History:    Functional Status:  Mobility:            ADL:    Cognition:      Physical Exam: There were no vitals taken for this visit. Physical Exam  Constitutional:  56 year old right-handed obese male  HENT:  Head: Normocephalic.  Multiple tooth extractions  Neck: Normal range of motion. Neck supple. No thyromegaly present.  Cardiovascular: Regular rhythm.  Murmur heard. Cardiac rate controlled  Respiratory: Effort normal and breath sounds normal. No respiratory distress. He has no wheezes. He has no rales.  GI: Soft. Bowel sounds are normal. He exhibits no distension.  Skin. Warm and dry Musculoskeletal: He exhibits no edema or tenderness.  Neurological: He is alert and oriented to person, place, and time.  Speech soft and slow. Slow to process. Able to answer orientation questions with minimal cues. He was able to follow simple motor commands without difficulty. Diffuse weakness. Strength grossly 4/5 prox to distal in UE and 3 to 3+HF to 4-KE, 4/5 ADF/PF in LE's Psych: flat but cooperative     Results for orders placed or performed during the hospital encounter of 08/10/17 (from the past 48 hour(s))  Glucose, capillary  Status: Abnormal   Collection Time: 08/10/17  4:59 PM  Result Value Ref Range   Glucose-Capillary 155 (H) 65 - 99 mg/dL   No results found.     Medical Problem List and Plan: 1.  Debility secondary to septic emboli to the brain  -Admit to inpatient rehab 2.  DVT Prophylaxis/Anticoagulation: SCDs. Monitor for any signs of DVT 3. Pain Management: Lidoderm patch, Tylenol as needed 4. Mood: Provide emotional support 5. Neuropsych: This patient is capable of making decisions on his own behalf. 6. Skin/Wound Care: Routine skin checks 7. Fluids/Electrolytes/Nutrition: Routine I&O's with follow-up chemistries 8. 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 Intravenous Teflaro 600 mg every 12 hours, daptomycin 1000 mg daily 6 weeks total. Confirmed duration with  infectious disease 9. Mitral regurgitation. Plan mitral valve replacement after conditioning and rehabilitation program completed per Dr. Zenaida NieceVan Tright 10. Multiple dental extractions 08/07/2017 11. Diabetes mellitus peripheral neuropathy. Lantus insulin 7 units daily. Check blood sugars before meals and at bedtime. Provide diabetic teaching 12. Diastolic congestive heart failure. Lasix 40 mg every 8 hours monitor for any signs of fluid overload 13. COPD/tobacco abuse. Counseling 14. Alcohol abuse. Monitor for any signs of withdrawal 15. Hypertension. Lopressor 25 mg twice a day 16. MRSA PCR screening positive. Contact precautions  Post Admission Physician Evaluation: 1. Functional deficits secondary  to septic emboli to the brain. 2. Patient is admitted to receive collaborative, interdisciplinary care between the physiatrist, rehab nursing staff, and therapy team. 3. Patient's level of medical complexity and substantial therapy needs in context of that medical necessity cannot be provided at a lesser intensity of care such as a SNF. 4. Patient has experienced substantial functional loss from his/her baseline which was documented above under the "Functional History" and "Functional Status" headings.  Judging by the patient's diagnosis, physical exam, and functional history, the patient has potential for functional progress which will result in measurable gains while on inpatient rehab.  These gains will be of substantial and practical use upon discharge  in facilitating mobility and self-care at the household level. 5. Physiatrist will provide 24 hour management of medical needs as well as oversight of the therapy plan/treatment and provide guidance as appropriate regarding the interaction of the two. 6. The Preadmission Screening has been reviewed and patient status is unchanged unless otherwise stated above. 7. 24 hour rehab nursing will assist with bladder management, bowel management, safety,  skin/wound care, disease management, medication administration, pain management and patient education  and help integrate therapy concepts, techniques,education, etc. 8. PT will assess and treat for/with: Lower extremity strength, range of motion, stamina, balance, functional mobility, safety, adaptive techniques and equipment, patient and family education, community reentry.   Goals are: Modified independent to supervision. 9. OT will assess and treat for/with: ADL's, functional mobility, safety, upper extremity strength, adaptive techniques and equipment, family and patient education, community reentry.   Goals are: Modified independent supervision. Therapy may proceed with showering this patient. 10. SLP will assess and treat for/with: Cognition and communication, swallowing.  Goals are: Modified independent supervision. 11. Case Management and Social Worker will assess and treat for psychological issues and discharge planning. 12. Team conference will be held weekly to assess progress toward goals and to determine barriers to discharge. 13. Patient will receive at least 3 hours of therapy per day at least 5 days per week. 14. ELOS: 12-17 days       15. Prognosis:  excellent     Ranelle OysterZachary T. Flor Houdeshell,  MD, [facility]APMR [facility] Physical Medicine & Rehabilitation 08/10/2017  Ranelle OysterSWARTZ,Sheronda Parran T, MD 08/10/2017   Mariam Dollaraniel Angiulli, [facility]

## 2017-08-10 NOTE — Progress Notes (Signed)
Pharmacy Antibiotic Note  Reginald [name] is a 56 y.o. male admitted on 07/25/2017 with MRSA bacteremia and endocarditis.  Pharmacy has been consulted for Daptomycin dosing, also on ceftaroline per ID. Rifampin d/c'd 12/18, as no prosthetic material requiring use. Repeat BCx negative. Afebrile, WBC WNL. SCr is WNL and CK is wnl and stable. Teeth extraction completed 12/17.  Plan: Continue daptomycin 1gm (~10mg /kg) IV Q24H Ceftaroline 600mg  IV q12h per ID F/u renal fxn, C&S, clinical status, LOT, Weekly CK - due 12/24 Plan MV replacement 12/24   Height: 5\' 9"  (175.3 cm) Weight: 237 lb 6.4 oz (107.7 kg) IBW/kg (Calculated) : 70.7  Temp (24hrs), Avg:99.3 F (37.4 C), Min:98.4 F (36.9 C), Max:100.1 F (37.8 C)  Recent Labs  Lab 08/05/17 2351 08/06/17 0240 08/07/17 0502 08/08/17 0407 08/09/17 0538 08/10/17 0413  WBC 7.3 7.4 6.8 7.3 6.5 5.4  CREATININE 0.91  --  0.91 0.93 0.84 0.71    Estimated Creatinine Clearance: 123.2 mL/min (by C-G formula based on SCr of 0.71 mg/dL).    Allergies  Allergen Reactions  . Morphine And Related     Antimicrobials this admission: 12/4 [facility] >> 12/10 12/10 daptomycin >> 12/10 ceftaroline >> 12/12 rifampin>>12/18  Dose adjustments this admission: 12/7 1800 Vancomycin peak level: 42 12/8 0230 Vancomycin trough level: 38  [facility]biology [facility]29[facility] BCx: MRSA (S bactrim, vanc, dapto, linezolid, TCN) - per Dr. Feliz BeamSnider's note  12/4 BCx: mrsa - Vanc MIC < 0.5 12/5 HIV Ab: negative 12/6 MRSA PCR: positive 12/7 BCx: 2/2 SA 12/10 BCx: MRSA 12/13 blood- neg  Babs BertinHaley Klani Caridi, PharmD, BCPS Clinical Pharmacist Clinical phone for 08/10/2017 until 3:30pm: [phone] If after 3:30pm, please call main pharmacy at: [phone] 08/10/2017 11:07 AM

## 2017-08-10 NOTE — Progress Notes (Signed)
Pre-op Cardiac Surgery  Carotid Findings:  Bilateral 1-39% ICA stenosis, antegrade vertebral flow.  Significant calcified plaque noted bilateral common and internal carotid arteries.   Upper Extremity Right Left  Brachial Pressures 116, Tri 118, Tri  Radial Waveforms Tri Tri  Ulnar Waveforms Tri Tri  Palmar Arch (Allen's Test) waveform is unchanged with radial compression and decreases greater than 50% with ulnar compression.   waveform increases with radial compression and reverses with ulnar compression.     Reginald [name]- RDMS, RVT 4:41 PM  08/10/2017

## 2017-08-10 NOTE — Progress Notes (Signed)
Rehab admissions - I met with patient.  He is agreeable to inpatient rehab prior to undergoing his MV replacement once he is stronger.  He has been cleared by attending MD for acute inpatient rehab admission today.  Bed available and will admit to inpatient rehab today.  Call me for questions.  #[phone]

## 2017-08-10 NOTE — Progress Notes (Signed)
08/10/17 1620 nursing Patient to rehab unit alert and oriented. Oriented to rehab unit. Fall precaution discussed and signed by patient.

## 2017-08-11 ENCOUNTER — Inpatient Hospital Stay (HOSPITAL_COMMUNITY): Payer: Self-pay | Admitting: Speech Pathology

## 2017-08-11 ENCOUNTER — Inpatient Hospital Stay (HOSPITAL_COMMUNITY): Payer: Self-pay

## 2017-08-11 ENCOUNTER — Other Ambulatory Visit: Payer: Self-pay

## 2017-08-11 ENCOUNTER — Inpatient Hospital Stay (HOSPITAL_COMMUNITY): Payer: Self-pay | Admitting: Physical Therapy

## 2017-08-11 DIAGNOSIS — D62 Acute posthemorrhagic anemia: Secondary | ICD-10-CM

## 2017-08-11 DIAGNOSIS — G894 Chronic pain syndrome: Secondary | ICD-10-CM

## 2017-08-11 DIAGNOSIS — R5381 Other malaise: Principal | ICD-10-CM

## 2017-08-11 DIAGNOSIS — I10 Essential (primary) hypertension: Secondary | ICD-10-CM

## 2017-08-11 DIAGNOSIS — E871 Hypo-osmolality and hyponatremia: Secondary | ICD-10-CM

## 2017-08-11 DIAGNOSIS — E1142 Type 2 diabetes mellitus with diabetic polyneuropathy: Secondary | ICD-10-CM

## 2017-08-11 LAB — COMPREHENSIVE METABOLIC PANEL
ALK PHOS: 140 U/L — AB (ref 38–126)
ALT: 23 U/L (ref 17–63)
ANION GAP: 8 (ref 5–15)
AST: 22 U/L (ref 15–41)
Albumin: 2 g/dL — ABNORMAL LOW (ref 3.5–5.0)
BUN: 10 mg/dL (ref 6–20)
CALCIUM: 8.4 mg/dL — AB (ref 8.9–10.3)
CO2: 27 mmol/L (ref 22–32)
Chloride: 94 mmol/L — ABNORMAL LOW (ref 101–111)
Creatinine, Ser: 0.66 mg/dL (ref 0.61–1.24)
GFR calc non Af Amer: >60 mL/min (ref >60)
Glucose, Bld: 197 mg/dL — ABNORMAL HIGH (ref 65–99)
Potassium: 3.7 mmol/L (ref 3.5–5.1)
SODIUM: 129 mmol/L — AB (ref 135–145)
Total Bilirubin: 0.7 mg/dL (ref 0.3–1.2)
Total Protein: 7.4 g/dL (ref 6.5–8.1)

## 2017-08-11 LAB — GLUCOSE, CAPILLARY
GLUCOSE-CAPILLARY: 170 mg/dL — AB (ref 65–99)
Glucose-Capillary: 165 mg/dL — ABNORMAL HIGH (ref 65–99)
Glucose-Capillary: 241 mg/dL — ABNORMAL HIGH (ref 65–99)
Glucose-Capillary: 202 mg/dL — ABNORMAL HIGH (ref 65–99)

## 2017-08-11 LAB — CBC WITH DIFFERENTIAL/PLATELET
Basophils Absolute: 0 10*3/uL (ref 0.0–0.1)
Basophils Relative: 1 %
EOS ABS: 0.2 10*3/uL (ref 0.0–0.7)
EOS PCT: 3 %
HCT: 24.4 % — ABNORMAL LOW (ref 39.0–52.0)
HEMOGLOBIN: 8.2 g/dL — AB (ref 13.0–17.0)
LYMPHS ABS: 1.3 10*3/uL (ref 0.7–4.0)
Lymphocytes Relative: 19 %
MCH: 30.3 pg (ref 26.0–34.0)
MCHC: 33.6 g/dL (ref 30.0–36.0)
MCV: 90 fL (ref 78.0–100.0)
MONOS PCT: 5 %
Monocytes Absolute: 0.4 10*3/uL (ref 0.1–1.0)
Neutro Abs: 4.8 10*3/uL (ref 1.7–7.7)
Neutrophils Relative %: 72 %
PLATELETS: 251 10*3/uL (ref 150–400)
RBC: 2.71 MIL/uL — ABNORMAL LOW (ref 4.22–5.81)
RDW: 14.2 % (ref 11.5–15.5)
WBC: 6.7 10*3/uL (ref 4.0–10.5)

## 2017-08-11 MED ORDER — OXYCODONE HCL 5 MG PO TABS
5.0000 mg | ORAL_TABLET | Freq: Two times a day (BID) | ORAL | Status: DC
  Administered 2017-08-11 – 2017-08-12 (×3): 5 mg via ORAL
  Filled 2017-08-11 (×3): qty 1

## 2017-08-11 NOTE — Progress Notes (Signed)
Social Work Assessment and Plan Social Work Assessment and Plan  Patient Details  Name: Reginald [name] MRN: [id number] Date of Birth: 12/17/1960  Today's Date: 08/11/2017  Problem List:  Patient Active Problem List   Diagnosis Date Noted  . Septic embolism (HCC) 08/10/2017  . Acute osteomyelitis of cervical spine (HCC) 08/10/2017  . 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 Debility 08/10/2017  . Hypertensive heart disease with heart failure (HCC)   . Endotracheal tube present   . Acute respiratory disease   . 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 Acute respiratory failure (HCC) 07/26/2017  . Alcohol abuse 07/26/2017  . Thrombocytopenia (HCC) 07/26/2017  . Abnormal liver enzymes 07/26/2017  . Acute encephalopathy 07/26/2017  . Acute diastolic CHF (congestive heart failure) (HCC) 07/26/2017  . Sepsis (HCC)   . Endocarditis of mitral valve   . Cerebral septic emboli (HCC)   . MRSA bacteremia 07/25/2017   Past Medical History:  Past Medical History:  Diagnosis Date  . Acute osteomyelitis of cervical spine (HCC) 08/10/2017  . Alcohol abuse   . Diabetes mellitus type 2 in obese (HCC)   . Neck pain   . 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 Tobacco abuse    Past Surgical History:  Past Surgical History:  Procedure Laterality Date  . HERNIA REPAIR  2007  . MULTIPLE EXTRACTIONS WITH ALVEOLOPLASTY N/A 08/07/2017   Procedure: Extraction of tooth #'s 6,11,12,14,19-29 and 32 with alveoloplasty;  Surgeon: Charlynne PanderKulinski, Ronald F, DDS;  [facility];  Service: Oral Surgery;  Laterality: N/A;  . RIGHT/LEFT HEART CATH AND CORONARY ANGIOGRAPHY N/A 08/02/2017   Procedure: RIGHT/LEFT HEART CATH AND CORONARY ANGIOGRAPHY;  Surgeon: Kathleene HazelMcAlhany, Christopher D, MD;  Location: [facility] INVASIVE CV LAB;  Service: Cardiovascular;  Laterality: N/A;  . Tonsillectomy /adnoidectomy     as aa child   Social History:  reports that he has quit smoking. His smoking use included cigarettes. he has never used  smokeless tobacco. He reports that he does not drink alcohol or use drugs.  Family / Support Systems Marital Status: Single Patient Roles: Partner, Parent Spouse/Significant Other: Doris [phone]-cell Children: Four children who come by every once in a while Anticipated Caregiver: Doris Ability/Limitations of Caregiver: Tyler AasDoris can come by and check on pt Caregiver Availability: Intermittent Family Dynamics: Pt is a Haematologistloner and likes being by himself. He can rely on Doris but not any of his children. He hopes to be able to have the surgery he needs and can get independent again. He doesn't want to burden Doris with his care  Social History Preferred language: English Religion:  Cultural Background: No issues Education: High School Read: Yes Write: Yes Employment Status: Unemployed Fish farm managerLegal Hisotry/Current Legal Issues: None  Guardian/Conservator: None-accoridng to MD pt is capable of making his own decisions while here   Abuse/Neglect Abuse/Neglect Assessment Can Be Completed: Yes Physical Abuse: Denies Verbal Abuse: Denies Sexual Abuse: Denies Exploitation of patient/patient's resources: Denies Self-Neglect: Denies  Emotional Status Pt's affect, behavior adn adjustment status: Pt is not feeling well today feels it is his first day and maybe he over did it and need to gradually ease into pushing himself in therapies. He is wanting to know when he will be having surgery told him what is in the chart, this seemed to appease him. Recent Psychosocial Issues: other health issues has not been to a MD in years Pyschiatric History: No history deferred depression screening due to not feeling well and nauseated. Do feel he will need to see neuro-psych while  here for substance abuse issues and for support Substance Abuse History: ETOH and Tobacco will need to quit both for his health issues. Will see neuro-psych while here  Patient / Family Perceptions, Expectations & Goals Pt/Family  understanding of illness & functional limitations: Pt is able to explain his health issues and what his treatment plan is. He does talk wiht the MD's and feels at times they are in a rush or he doesn't understand all that tell him. Will ask PA to go in and answer his questions when able. Premorbid pt/family roles/activities: Boyfriend, father, friend, etc Anticipated changes in roles/activities/participation: resume Pt/family expectations/goals: Pt states: " I want to get stronger and not need this oxygen and have the surgery to be able to go home."  [employer] Community Agencies: None Premorbid Home Care/DME Agencies: None Transportation available at discharge: Dollar GeneralDoris Resource referrals recommended: Neuropsychology, Support group (specify)  Discharge Planning Living Arrangements: Alone Support Systems: Spouse/significant other, Children, Friends/neighbors Type of Residence: Other (Comment)(camper with three steps in) Insurance Resources: Customer service managerelf-pay Financial Resources: Family Support Financial Screen Referred: Yes Living Expenses: Own Money Management: Patient Does the patient have any problems obtaining your medications?: Yes (Describe)(uninsured doesn';t go to a doctor) Home Management: self Patient/Family Preliminary Plans: Return to his camper and have Tyler AasDoris come in and check on him daily. She does not plan to stay with him. Will need to get him mod/i so he is safe at home alone. Will await therapy team's evaluations. Sw Barriers to Discharge: Decreased caregiver support, Medication compliance, Home environment access/layout Sw Barriers to Discharge Comments: Doesn';t have 24 hr care, needs to be mod/i prior to discharge home Social Work Anticipated Follow Up Needs: HH/OP, Support Group  Clinical Impression Pleasant patient who has many health issues. Plan to be on rehab to get stronger for his MV surgery. Will need 6 weeks of IV antibiotics and is uninsured. Will need to have  apply for disability and medicaid while here. Doris his girlfriend can check on him but not stay there. Will need to see neuro-psych while here for substance abuse issues.  Lucy Chrisupree, Phelan Goers G 08/11/2017, 1:23 PM

## 2017-08-11 NOTE — Progress Notes (Signed)
Ranelle OysterSwartz, Zachary T, MD  Physician  Physical Medicine and Rehabilitation  Consult Note  Signed  Date of Service:  08/02/2017 2:36 PM       Related encounter: Ad[facility] (Discharged) [facility]      Signed      Expand All Collapse All       [] Hide copied text  [] Hover for details        Physical Medicine and Rehabilitation Consult   Reason for Consult: Debility  Referring Physician: Dr. Margo AyeHall   HPI: Reginald [name] Reginald [name] is a 56 y.o. male with history of diastolic CHF, COPD, T2DM,  alcohol abuse who [facility] and found to have MRSA bacteremia with endocarditis. Hospital course significant for acute on chronic CHF and alcohol withdrawal with  confusion with agitation.  He was found to have 4.5 cm AAA as well as cervical DDD.  [facility] and [facility]. MRI brain done revealing  Multiple small areas of restricted diffusion bilaterally most compatible with septic emboli. Dr. Drue SecondSnider consulted for input and recommended continuing antibiotics as well as TEE for work up.   TEE done revealing large mass on anterior mitarl leaflet with central regurgitation jet suggestive for possible perforation as well as possible small mass on AV (could be due to resolved endocarditis).   He had issues with agitation requiring precedex but developed obtundation requiring intubation for airway protection on 12/6. Repeat BC 12/7 and 12/10 positive and CT abdomen/chest showed splenic emboli and possible PNA.   Rifampin added to ceftaroline/daptomycin on 12/12.   Neurology consulted for input and recommended CIWA protocol and treating infection--no ASA needed. CTA head was negative for large or medium vessel stenosis or occlusion. Cardiology following to help manage CHF and he underwent diagnostic cardiac cath yesterday.  Plans for dental extraction of necrotic teeth and Dr. Donata ClayVan Trigt following for input on  surgery. Cardiology recommends surgery once BC sterile and sooner rather than later to prevent recurrent embolization.  He tolerated extubation on 12/10 and therapy evaluation yesterday revealed debilitated state. CIR recommended due to functional deficits.      Review of Systems  HENT: Negative for hearing loss.   Eyes: Negative for blurred vision and double vision.  Respiratory: Positive for shortness of breath.   Cardiovascular: Negative for chest pain.  Gastrointestinal: Negative for abdominal pain and heartburn.  Genitourinary: Negative for dysuria and urgency.  Musculoskeletal: Negative for back pain and myalgias.  Skin: Negative for itching and rash.  Neurological: Positive for dizziness (in the past), weakness and headaches. Negative for tingling and tremors.  Psychiatric/Behavioral: Positive for memory loss.         Past Medical History:  Diagnosis Date  . Alcohol abuse   . Diabetes mellitus type 2 in obese (HCC)   . Neck pain   . Tobacco abuse          Past Surgical History:  Procedure Laterality Date  . HERNIA REPAIR  2007  . Tonsillectomy /adnoidectomy     as aa child         Family History  Problem Relation Age of Onset  . Hypertension Mother   . Hypertension Father     Social History:  Lives with girlfriend. Works as a Gafferhandyman (fixes everything)? He reports that he has been smoking cigarettes--1 PPD.  he has never used smokeless tobacco. He reports that he drinks about 2.4 - 3.0 oz of alcohol per  week. He reports that he does not use drugs.        Allergies  Allergen Reactions  . Morphine And Related           Medications Prior to Admission  Medication Sig Dispense Refill  . amLODipine (NORVASC) 10 MG tablet Take 10 mg by mouth daily.  0  . diazepam (VALIUM) 2 MG tablet Take 2 mg by mouth 2 (two) times daily.  1  . gabapentin (NEURONTIN) 300 MG capsule Take 300 mg by mouth 2 (two) times daily.  1  . glipiZIDE (GLUCOTROL)  10 MG tablet Take 10 mg by mouth daily.  2  . hydrochlorothiazide (HYDRODIURIL) 25 MG tablet Take 25 mg by mouth daily.  3  . lisinopril (PRINIVIL,ZESTRIL) 20 MG tablet Take 20 mg by mouth daily.  3  . metFORMIN (GLUCOPHAGE) 500 MG tablet Take 500 mg by mouth 2 (two) times daily.  2  . naproxen sodium (ALEVE) 220 MG tablet Take 220-440 mg by mouth 2 (two) times daily as needed (pain).    . sildenafil (REVATIO) 20 MG tablet Take 50 mg by mouth as directed. Take 50mg  by mouth 1 hour before intercourse  0    Home: Home Living Family/patient expects to be discharged to:: Private residence Living Arrangements: Spouse/significant other Available Help at Discharge: Family Type of Home: House Home Access: Level entry Home Layout: One level Home Equipment: None  Functional History: Prior Function Level of Independence: Independent Functional Status:  Mobility: Bed Mobility Overal bed mobility: Needs Assistance Bed Mobility: Supine to Sit, Sit to Supine Supine to sit: +2 for physical assistance, Mod assist Sit to supine: +2 for physical assistance, Min assist General bed mobility comments: Assist to bring legs off of bed and elevate trunk into sitting. Assist to lower trunk and bring legs up into bed returning to supine. Transfers Overall transfer level: Needs assistance Equipment used: 4-wheeled walker Transfers: Sit to/from Stand Sit to Stand: +2 physical assistance, Mod assist General transfer comment: Assist to bring hips up and for balance. Verbal cues for hand placement. Ambulation/Gait Ambulation/Gait assistance: +2 safety/equipment, Mod assist Ambulation Distance (Feet): 3 Feet Assistive device: Rolling walker (2 wheeled) Gait Pattern/deviations: Step-through pattern, Decreased step length - right, Decreased step length - left, Shuffle, Trunk flexed General Gait Details: Assist for balance and support.  Gait velocity: decr Gait velocity interpretation: Below normal  speed for age/gender  ADL:  Cognition: Cognition Overall Cognitive Status: Impaired/Different from baseline Orientation Level: Oriented to person, Oriented to place, Oriented to time Cognition Arousal/Alertness: Awake/alert(sleepy but easily arousable) Behavior During Therapy: WFL for tasks assessed/performed Overall Cognitive Status: Impaired/Different from baseline Area of Impairment: Attention, Memory, Following commands, Safety/judgement, Problem solving Current Attention Level: Sustained Memory: Decreased short-term memory Following Commands: Follows one step commands consistently, Follows one step commands with increased time Safety/Judgement: Decreased awareness of safety, Decreased awareness of deficits Problem Solving: Slow processing, Difficulty sequencing, Requires verbal cues, Requires tactile cues   Blood pressure (!) 147/83, pulse 95, temperature 98.3 F (36.8 C), temperature source Oral, resp. rate (!) 23, height 5\' 9"  (1.753 m), weight 108.8 kg (239 lb 13.8 oz), SpO2 96 %. Physical Exam  Nursing note and vitals reviewed. Constitutional: He is oriented to person, place, and time. He appears well-developed and well-nourished. No distress.  Morbidly obese  HENT:  Head: Normocephalic and atraumatic.  Mouth/Throat: Oropharynx is clear and moist.  Teeth with multiple caries  Eyes: Conjunctivae are normal. Pupils are equal, round, and reactive to light.  Neck: Normal range of motion. Neck supple.  Cardiovascular: Regular rhythm.  Murmur heard. Respiratory: Effort normal and breath sounds normal. No stridor. No respiratory distress. He has no wheezes.  GI: Soft. Bowel sounds are normal. He exhibits no distension. There is no tenderness.  Musculoskeletal: He exhibits no edema or tenderness.  Neurological: He is alert and oriented to person, place, and time.  Speech soft and slow. Slow to process. Able to answer orientation questions with minimal cues. He was able to  follow simple motor commands without difficulty. Diffuse weakness. Strength grossly 4/5 prox to distal in UE and 3+ HF to 4-KE, 4/5 ADF/PF in LE's.   Skin: Skin is warm and dry. He is not diaphoretic.  Psychiatric: His affect is blunt. His speech is delayed. He is slowed.            Assessment/Plan: Diagnosis: functional and cognitive deficits secondary to septic emboli to the brain/debility 1. Does the need for close, 24 hr/day medical supervision in concert with the patient's rehab needs make it unreasonable for this patient to be served in a less intensive setting? Yes 2. Co-Morbidities requiring supervision/potential complications: etoh abuse, morbid obesity, endocarditis/MRSA bacteremia 3. Due to bladder management, bowel management, safety, skin/wound care, disease management, medication administration, pain management and patient education, does the patient require 24 hr/day rehab nursing? Yes 4. Does the patient require coordinated care of a physician, rehab nurse, PT (1-2 hrs/day, 5 days/week), OT (1-2 hrs/day, 5 days/week) and SLP (1-2 hrs/day, 5 days/week) to address physical and functional deficits in the context of the above medical diagnosis(es)? Yes Addressing deficits in the following areas: balance, endurance, locomotion, strength, transferring, bowel/bladder control, bathing, dressing, feeding, grooming, toileting, cognition and psychosocial support 5. Can the patient actively participate in an intensive therapy program of at least 3 hrs of therapy per day at least 5 days per week? Yes 6. The potential for patient to make measurable gains while on inpatient rehab is good 7. Anticipated functional outcomes upon discharge from inpatient rehab are modified independent and supervision  with PT, modified independent and supervision with OT, modified independent and supervision with SLP. 8. Estimated rehab length of stay to reach the above functional goals is: 12-17  days 9. Anticipated D/C setting: Home 10. Anticipated post D/C treatments: HH therapy 11. Overall Rehab/Functional Prognosis: excellent  RECOMMENDATIONS: This patient's condition is appropriate for continued rehabilitative care in the following setting: CIR Patient has agreed to participate in recommended program. Yes Note that insurance prior authorization may be required for reimbursement for recommended care.  Comment: Rehab Admissions Coordinator to follow up.  Thanks,  Ranelle OysterZachary T. Swartz, MD, Earlie CountsFAAPMR    Love, Pamela S, PA-C 08/02/2017          Revision History                        Routing History

## 2017-08-11 NOTE — Evaluation (Signed)
Physical Therapy Assessment and Plan  Patient Details  Name: Reginald [name] MRN: [id number] Date of Birth: 1960/09/08  PT Diagnosis: Difficulty walking Rehab Potential: Good ELOS: 7-10   Today's Date: 08/11/2017 PT Individual Time: 9373-4287 PT Individual Time Calculation (min): 60 min    Problem List:  Patient Active Problem List   Diagnosis Date Noted  . Acute blood loss anemia   . Hyponatremia   . Essential hypertension   . Type 2 diabetes mellitus with peripheral neuropathy (HCC)   . Chronic pain syndrome   . Septic embolism ([place]) 08/10/2017  . Acute osteomyelitis of cervical spine ([place]) 08/10/2017  . 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 Debility 08/10/2017  . Hypertensive heart disease with heart failure ([place])   . Endotracheal tube present   . Acute respiratory disease   . 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 Acute respiratory failure ([place]) 07/26/2017  . Alcohol abuse 07/26/2017  . Thrombocytopenia ([place]) 07/26/2017  . Abnormal liver enzymes 07/26/2017  . Acute encephalopathy 07/26/2017  . Acute diastolic CHF (congestive heart failure) ([place]) 07/26/2017  . Sepsis ([place])   . Endocarditis of mitral valve   . 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 MRSA bacteremia 07/25/2017    Past Medical History:  Past Medical History:  Diagnosis Date  . Acute osteomyelitis of cervical spine ([place]) 08/10/2017  . Alcohol abuse   . Diabetes mellitus type 2 in obese ([place])   . Neck pain   . 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 Tobacco abuse    Past Surgical History:  Past Surgical History:  Procedure Laterality Date  . HERNIA REPAIR  2007  . MULTIPLE EXTRACTIONS WITH ALVEOLOPLASTY N/A 08/07/2017   Procedure: Extraction of tooth #'s 6,11,12,14,19-29 and 32 with alveoloplasty;  Surgeon: Lenn Cal, DDS;  Location: [facility];  Service: Oral Surgery;  Laterality: N/A;  . RIGHT/LEFT HEART CATH AND CORONARY ANGIOGRAPHY N/A 08/02/2017   Procedure: RIGHT/LEFT HEART CATH AND CORONARY  ANGIOGRAPHY;  Surgeon: Burnell Blanks, MD;  Location: [facility];  Service: Cardiovascular;  Laterality: N/A;  . Tonsillectomy /adnoidectomy     as aa child    Assessment & Plan Clinical Impression: A 56 y.o. male with history of diastolic CHF, COPD, G8TL,  alcohol abuse who was admitted to [facility] with hypoxia and found to have MRSA bacteremia with endocarditis. Hospital course significant for acute on chronic CHF and alcohol withdrawal with  confusion with agitation.  He was found to have 4.5 cm AAA as well as cervical DDD.  He was transferred to [facility] for evaluation and treatment. MRI brain done revealing  Multiple small areas of restricted diffusion bilaterally most compatible with septic emboli. Dr. Baxter Flattery consulted for input and recommended continuing antibiotics as well as TEE for work up.   TEE done revealing large mass on anterior mitarl leaflet with central regurgitation jet suggestive for possible perforation as well as possible small mass on AV (could be due to resolved endocarditis).    He had issues with agitation requiring precedex but developed obtundation requiring intubation for airway protection on 12/6 and extubated 07/31/2017. Repeat BC 12/7 and 12/10 positive and CT abdomen/chest showed splenic emboli and possible PNA.   Rifampin added to ceftaroline/daptomycin on 12/12.   Neurology consulted for input and recommended CIWA protocol and treating infection--no ASA needed. CTA head was negative for large or medium vessel stenosis or occlusion. Cardiology following to help manage CHF and he underwent diagnostic cardiac cath  08/01/2017 .  MRSA PCR  screening positive and continues on contact precautions.   Underwent multiple dental extraction of necrotic teeth 08/07/2017 in preparation for mitral valve replacement with bioprosthetic valve which had initially been scheduled for 08/14/2017 but surgeon raises valid concerns about patient's degree of conditioning and at  this time advised to hold on surgical procedure until patient was able to tolerate... Follow-up per infectious disease currently is rifampin was discontinued 08/09/2017 and continue with daptomycin and teflaro 6 weeks total and latest blood culture showed no growth. Physical and occupational therapy ongoing.   Patient to be admitted for a comprehensive inpatient rehabilitation program.  Patient transferred to CIR on 08/10/2017 .   Patient currently requires min with mobility secondary to muscle weakness and decreased standing balance and decreased balance strategies.  Prior to hospitalization, patient was independent  with mobility and lived with Alone in a Mobile home home.  Home access is 2Stairs to enter.  Patient will benefit from skilled PT intervention to maximize safe functional mobility, minimize fall risk and decrease caregiver burden for planned discharge back to acute care.  Anticipate patient will return to acute for surgery following CIR stay, and will f/u with PT in that setting. at discharge.  PT - End of Session Activity Tolerance: Tolerates < 10 min activity with changes in vital signs Endurance Deficit: Yes Endurance Deficit Description: requires frequent and extended rest breaks throughout session  PT Assessment Rehab Potential (ACUTE/IP ONLY): Good PT Barriers to Discharge: Decreased caregiver support;Medical stability PT Barriers to Discharge Comments: unsure of girlfriend's plan to provide supervision at d/c, she was not present to confirm  PT Patient demonstrates impairments in the following area(s): Balance;Endurance PT Transfers Functional Problem(s): Bed Mobility;Bed to Chair;Car;Furniture PT Locomotion Functional Problem(s): Stairs;Wheelchair Mobility;Ambulation PT Plan PT Intensity: Minimum of 1-2 x/day ,45 to 90 minutes PT Frequency: 5 out of 7 days PT Duration Estimated Length of Stay: 7-10 PT Treatment/Interventions: Ambulation/gait training;Discharge  planning;Functional mobility training;Psychosocial support;Therapeutic Activities;Visual/perceptual remediation/compensation;Wheelchair propulsion/positioning;Therapeutic Exercise;Balance/vestibular training;Cognitive remediation/compensation;DME/adaptive equipment instruction;Pain management;Splinting/orthotics;UE/LE Strength taining/ROM;Community reintegration;Functional electrical stimulation;Patient/family education;Stair training;UE/LE Coordination activities PT Transfers Anticipated Outcome(s): mod I  PT Locomotion Anticipated Outcome(s): mod I short distances ambulatory with LRAD PT Recommendation Follow Up Recommendations: Other (comment)(plan for d/c back to acute for surgery, should re-consult acute PT at that time) Patient destination: (acute care) Equipment Recommended: To be determined  Skilled Therapeutic Intervention C/o soreness in neck, but does not rate, grimaces throughout session.  Session focus on PT assessment (see below), and pt instructed in rehab potential, goals of therapy, and ELOS.  Pt provided min verbal cues for safe transfers and use of RW throughout session.  Pt with pain in dorsum of L foot with pressure for muscle testing, on inspection dorsum noted to be red and mildly swollen.  RN aware.  Pt requesting to terminate session early 2/2 fatigue.  Positioned in bed with call bell in reach and needs met.   PT Evaluation Precautions/Restrictions Precautions Precautions: Fall Precaution Comments: debility, watch O2 sats General PT Amount of Missed Time (min): 15 Minutes PT Missed Treatment Reason: Patient fatigue  Home Living/Prior Functioning Home Living Available Help at Discharge: Family;Friend(s) Type of Home: Mobile home Home Access: Stairs to enter Entrance Stairs-Number of Steps: 2 Entrance Stairs-Rails: None(can put in rails if needed) Home Layout: One level Bathroom Accessibility: No  Lives With: Alone Prior Function Level of Independence:  Independent with gait;Independent with transfers  Able to Take Stairs?: Yes Driving: Yes Vocation: Self employed(handyman, 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 Vision/Perception  Perception Perception: Within Functional Limits Praxis Praxis: Intact  Cognition Overall Cognitive Status: Impaired/Different from baseline Arousal/Alertness: Awake/alert Orientation Level: Oriented X4 Attention: Selective Sustained Attention: Appears intact Selective Attention: Impaired Selective Attention Impairment: Verbal complex Memory: Impaired Memory Impairment: Decreased recall of new information;Retrieval deficit Awareness: Appears intact Problem Solving: Impaired Problem Solving Impairment: Functional complex Executive Function: Self Monitoring;Self Correcting Self Monitoring: Impaired Self Monitoring Impairment: Functional complex Self Correcting: Impaired Self Correcting Impairment: Functional complex Safety/Judgment: Impaired Comments: Pt laying towards the foot of his bed upon therapist's arrival with one foot on the floor Sensation Sensation Light Touch: Appears Intact(LEs) Proprioception: Appears Intact Coordination Gross Motor Movements are Fluid and Coordinated: Yes Motor  Motor Motor: Within Functional Limits Motor - Skilled Clinical Observations: generalized weakness throughout  Mobility Bed Mobility Bed Mobility: Supine to Sit;Sit to Supine Supine to Sit: 4: Min assist;With rails;HOB elevated Supine to Sit Details: Verbal cues for sequencing;Verbal cues for technique Transfers Transfers: Yes Stand Pivot Transfers: 4: Min assist Stand Pivot Transfer Details: Verbal cues for sequencing;Verbal cues for technique;Verbal cues for precautions/safety Locomotion  Ambulation Ambulation: Yes Ambulation/Gait Assistance: 4: Min assist Ambulation Distance (Feet): 30 Feet Assistive device: (rail in hallway) Stairs / Additional Locomotion Stairs: No Wheelchair  Mobility Wheelchair Mobility: No  Trunk/Postural Assessment  Cervical Assessment Cervical Assessment: Within Functional Limits Thoracic Assessment Thoracic Assessment: Within Functional Limits Lumbar Assessment Lumbar Assessment: Within Functional Limits Postural Control Postural Control: Within Functional Limits  Balance Balance Balance Assessed: Yes Dynamic Sitting Balance Dynamic Sitting - Level of Assistance: 5: Stand by assistance Dynamic Standing Balance Dynamic Standing - Balance Support: Right upper extremity supported Dynamic Standing - Level of Assistance: 4: Min assist Extremity Assessment      RLE Assessment RLE Assessment: Within Functional Limits(5/5 proximal to distal) LLE Assessment LLE Assessment: Within Functional Limits(5/5 proximal to distal)   See Function Navigator for Current Functional Status.   Refer to Care Plan for Long Term Goals  Recommendations for other services: None   Discharge Criteria: Patient will be discharged from PT if patient refuses treatment 3 consecutive times without medical reason, if treatment goals not met, if there is a change in medical status, if patient makes no progress towards goals or if patient is discharged from hospital.  The above assessment, treatment plan, treatment alternatives and goals were discussed and mutually agreed upon: by patient  Michel Santee 08/11/2017, 5:28 PM

## 2017-08-11 NOTE — Evaluation (Signed)
Speech Language Pathology Assessment and Plan  Patient Details  Name: Reginald [name] MRN: [id number] Date of Birth: 03/04/1961  SLP Diagnosis: Cognitive Impairments  Rehab Potential: Good ELOS: 7-10 days     Today's Date: 08/11/2017 SLP Individual Time: 1345-1440 SLP Individual Time Calculation (min): 55 min   Problem List:  Patient Active Problem List   Diagnosis Date Noted  . Acute blood loss anemia   . Hyponatremia   . Essential hypertension   . Type 2 diabetes mellitus with peripheral neuropathy (HCC)   . Chronic pain syndrome   . Septic embolism ([place]) 08/10/2017  . Acute osteomyelitis of cervical spine ([place]) 08/10/2017  . 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 Debility 08/10/2017  . Hypertensive heart disease with heart failure ([place])   . Endotracheal tube present   . Acute respiratory disease   . 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 Acute respiratory failure ([place]) 07/26/2017  . Alcohol abuse 07/26/2017  . Thrombocytopenia ([place]) 07/26/2017  . Abnormal liver enzymes 07/26/2017  . Acute encephalopathy 07/26/2017  . Acute diastolic CHF (congestive heart failure) ([place]) 07/26/2017  . Sepsis ([place])   . Endocarditis of mitral valve   . 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 MRSA bacteremia 07/25/2017   Past Medical History:  Past Medical History:  Diagnosis Date  . Acute osteomyelitis of cervical spine ([place]) 08/10/2017  . Alcohol abuse   . Diabetes mellitus type 2 in obese ([place])   . Neck pain   . 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 Tobacco abuse    Past Surgical History:  Past Surgical History:  Procedure Laterality Date  . HERNIA REPAIR  2007  . MULTIPLE EXTRACTIONS WITH ALVEOLOPLASTY N/A 08/07/2017   Procedure: Extraction of tooth #'s 6,11,12,14,19-29 and 32 with alveoloplasty;  Surgeon: Lenn Cal, DDS;  Location: [place];  Service: Oral Surgery;  Laterality: N/A;  . RIGHT/LEFT HEART CATH AND CORONARY ANGIOGRAPHY N/A 08/02/2017   Procedure: RIGHT/LEFT  HEART CATH AND CORONARY ANGIOGRAPHY;  Surgeon: Burnell Blanks, MD;  Location: [facility];  Service: Cardiovascular;  Laterality: N/A;  . Tonsillectomy /adnoidectomy     as aa child    Assessment / Plan / Recommendation Clinical Impression   Reginald [name] is a 56 y.o. male with history of diastolic CHF, COPD, N3IR,  alcohol abuse who was admitted to [facility] with hypoxia and found to have MRSA bacteremia with endocarditis. Hospital course significant for acute on chronic CHF and alcohol withdrawal with  confusion with agitation.  He was found to have 4.5 cm AAA as well as cervical DDD.  He was transferred to [facility] for evaluation and treatment. MRI brain done revealing  Multiple small areas of restricted diffusion bilaterally most compatible with septic emboli. Dr. Baxter Flattery consulted for input and recommended continuing antibiotics as well as TEE for work up.   TEE done revealing large mass on anterior mitral leaflet with central regurgitation jet suggestive for possible perforation as well as possible small mass on AV (could be due to resolved endocarditis).  He had issues with agitation requiring precedex but developed obtundation requiring intubation for airway protection on 12/6 and extubated 07/31/2017. Repeat BC 12/7 and 12/10 positive and CT abdomen/chest showed splenic emboli and possible PNA.   Rifampin added to ceftaroline/daptomycin on 12/12.   Neurology consulted for input and recommended CIWA protocol and treating infection--no ASA needed. CTA head was negative for large or medium vessel stenosis or occlusion. Cardiology following to help manage CHF and he underwent diagnostic  cardiac cath  08/01/2017 .  MRSA PCR screening positive and continues on contact precautions.   Underwent multiple dental extraction of necrotic teeth 08/07/2017 in preparation for mitral valve replacement with bioprosthetic valve which had initially been scheduled for 08/14/2017 but surgeon raises  valid concerns about patient's degree of conditioning and at this time advised to hold on surgical procedure until patient was able to tolerate... Follow-up per infectious disease currently is rifampin was discontinued 08/09/2017 and continue with daptomycin and teflaro 6 weeks total and latest blood culture showed no growth. Physical occupational therapy ongoing patient was admitted for a comprehensive rehabilitation program.  SLP evaluation completed 08/11/2017 with results as follows: Pt presents with mild higher level cognitive impairment characterized by decreased safety awareness, executive functioning deficits, decreased retrieval of information, and decreased selective attention to tasks.  As a result, pt would benefit from skilled ST while inpatient in order to maximize functional independence and reduce burden of care prior to discharge.  ST follow up recommendations to be determined pending progress made while inpatient.    Skilled Therapeutic Interventions          Pt was laying at the foot of his bed with one foot resting on the floor upon therapist's arrival .  He had not requested help prior to SLP's arrival and almost slipped out of bed when transitioning from supine to sitting at edge of bed had therapist not been guarding.  Pt could only recall 1 out of 5 words on delayed recall subtest of MoCA standardized cognitive assessment which improved to 3 out of 5 with mod assist verbal cues.  Functional recall of information pertinent to his hospital course was better than on evaluation but still impaired.  Pt scored 24/30 on the MoCA with deficits also noted in executive functioning and abstract reasoning.  Discussed rationale for ST interventions for cognition.  Pt verbalized understanding and all questions were answered to his satisfaction at this time.  Pt instructed to call for assistance with any needs and was left with bed alarm set and call bell within reach.  Continue per current plan of care.     SLP Assessment  Patient will need skilled [facility] during CIR admission    Recommendations  Recommendations for Other Services: Neuropsych consult Patient destination: Home Follow up Recommendations: Other (comment)(TBD) Equipment Recommended: None recommended by SLP    SLP Frequency 3 to 5 out of 7 days   SLP Duration  SLP Intensity  SLP Treatment/Interventions 7-10 days   Minumum of 1-2 x/day, 30 to 90 minutes  Cognitive remediation/compensation;Cueing hierarchy;Environmental controls;Internal/external aids    Pain Pain Assessment Pain Assessment: Faces Faces Pain Scale: Hurts even more Pain Location: Neck Pain Descriptors / Indicators: Aching Pain Intervention(s): Other (Comment);Repositioned(Pt reports being premedicated prior to therapist's arrival)  Prior Functioning Cognitive/Linguistic Baseline: Within functional limits Type of Home: Mobile home  Lives With: Alone Available Help at Discharge: Family;Friend(s) Education: 11th grade Vocation: Self employed(handyman, odd jobs)  Function:  Eating Eating                 Cognition Comprehension Comprehension assist level: Follows complex conversation/direction with no assist  Expression   Expression assist level: Expresses complex ideas: With no assist  Social Interaction Social Interaction assist level: Interacts appropriately 90% of the time - Needs monitoring or encouragement for participation or interaction.  Problem Solving Problem solving assist level: Solves basic 75 - 89% of the time/requires cueing 10 - 24% of the time  Memory Memory assist level: Recognizes or recalls 75 - 89% of the time/requires cueing 10 - 24% of the time   Short Term Goals: Week 1: SLP Short Term Goal 1 (Week 1): STG=LTG due to ELOS  Refer to Care Plan for Long Term Goals  Recommendations for other services: Neuropsych  Discharge Criteria: Patient will be discharged from SLP if patient  refuses treatment 3 consecutive times without medical reason, if treatment goals not met, if there is a change in medical status, if patient makes no progress towards goals or if patient is discharged from hospital.  The above assessment, treatment plan, treatment alternatives and goals were discussed and mutually agreed upon: by patient  Emilio Math 08/11/2017, 3:57 PM

## 2017-08-11 NOTE — Plan of Care (Signed)
Pt voiding with urinal Pt has Ultram and tylenol for pain One time dose last night of Roxicodone 5mg  Pt on Roxicodone at home bid  Will speak to MD regarding resuming home medication Will speak to MD regarding Melatonin for sleep

## 2017-08-11 NOTE — Care Management Note (Signed)
[facility]patient [facility] Individual Statement of Services  Patient Name:  Reginald [name]  Date:  08/11/2017  Welcome to the [facility]patient [facility].  Our goal is to provide you with an individualized program based on your diagnosis and situation, designed to meet your specific needs.  With this comprehensive rehabilitation program, you will be expected to participate in at least 3 hours of rehabilitation therapies Monday-Friday, with modified therapy programming on the weekends.  Your rehabilitation program will include the following services:  Physical Therapy (PT), Occupational Therapy (OT), 24 hour per day rehabilitation nursing, Therapeutic Recreaction (TR), Neuropsychology, Case Management (Social Worker), Rehabilitation Medicine, Nutrition Services and Pharmacy Services  Weekly team conferences will be held  to discuss your progress.  Your Social Worker will talk with you frequently to get your input and to update you on team discussions.  Team conferences with you and your family in attendance may also be held.  Expected length of stay: 7-10 days  Overall anticipated outcome: mod/i-supervision witt tub and bathing  Depending on your progress and recovery, your program may change. Your Social Worker will coordinate services and will keep you informed of any changes. Your Social Worker's name and contact numbers are listed  below.  The following services may also be recommended but are not provided by the [facility]patient [facility]:   Driving Evaluations  Home Health Rehabiltiation Services  Outpatient Rehabilitation Services   Arrangements will be made to provide these services after discharge if needed.  Arrangements include referral to agencies that provide these services.  Your insurance has been verified to be:  Pending medicaid Your primary doctor is:  None  Pertinent information will be shared with your doctor and your insurance company.  Social  Worker:  Dossie DerBecky Taisha Pennebaker, [phone] or [phone]) [phone]  Information discussed with and copy given to patient by: Reginald [name], Reginald [name], 08/11/2017, 1:04 PM

## 2017-08-11 NOTE — Progress Notes (Signed)
Trish MageLogue, Florice Hindle M, RN  Rehab Admission Coordinator  Physical Medicine and Rehabilitation  PMR Pre-admission  Signed  Date of Service:  08/10/2017 2:14 PM       Related encounter: Admission (Discharged) [facility]      Signed            [] Hide copied text  [] Hover for details   PMR Admission Coordinator Pre-Admission Assessment  Patient: Reginald [name] is an 56 y.o., male MRN: [id number] DOB: 06/07/1961 Height: 5\' 9"  (175.3 cm) Weight: 107.7 kg (237 lb 6.4 oz)                                                                                                                                                  Insurance Information Self pay - no insurance  Medicaid Application Date:        Case Manager:   Disability Application Date:        Case Worker:    Emergency ActuaryContact Information        Contact Information    Name Relation Home Work [place]   Ange[place], Joseph S Father [phone]     Claudean KindsButcher, doris Significant other   [phone]     Current Medical History  Patient Admitting Diagnosis: Functional and cognitive deficits secondary to septic emboli to the brain/debility  History of Present Illness: A 56 y.o.malewith history of diastolic CHF, COPD, T2DM, alcohol abuse [facility] b[facility] with endocarditis. Hospital course significant for acute on chronic CHF and alcohol withdrawal with confusion with agitation. He was found to have 4.5 cm AAA as well as cervical DDD. [facility]. MRI brain done revealing Multiple small areas of restricted diffusion bilaterally most compatible with septic emboli. Dr. Drue SecondSnider consulted for input and recommended continuing antibiotics as well as TEE for work up.  TEE done revealing large mass on anterior mitarl leaflet with central regurgitation jet suggestive for possible  perforation as well as possible small mass on AV (could be due to resolved endocarditis).   He had issues with agitation requiring precedex but developed obtundation requiring intubation for airway protection on 12/6and extubated 07/31/2017. Repeat BC 12/7 and 12/10 positive and CT abdomen/chest showed splenic emboli and possible PNA. Rifampin added to ceftaroline/daptomycin on 12/12. Neurology consulted for input and recommended CIWA protocol and treating infection--no ASA needed. CTA head was negative for large or medium vessel stenosis or occlusion. Cardiology following to help manage CHF and he underwent diagnosticcardiac cath12/06/2017 .MRSA PCR screening positive and continues on contact precautions. Underwent multipledental extraction of necrotic teeth12/17/2018 in preparation for mitral valve replacement with bioprosthetic valve which had initially been scheduled for 08/14/2017 but surgeon raises valid concerns about patient's degree of conditioning and at this time advised to  hold on surgical procedure until patient was able to tolerate.Marland KitchenMarland KitchenFollow-up per infectious disease currently is rifampin was discontinued 08/09/2017 and continue with daptomycin andteflaro6 weeks total and latest blood culture showed no growth.Physical and occupational therapy ongoing.   Patient to be admitted for a comprehensive inpatient rehabilitation program.   Total: 6=NIH  Past Medical History      Past Medical History:  Diagnosis Date  . Alcohol abuse   . Diabetes mellitus type 2 in obese (HCC)   . Neck pain   . Tobacco abuse     Family History  family history includes Hypertension in his father and mother.  Prior Rehab/Hospitalizations: No previous rehab.  Has the patient had major surgery during 100 days prior to admission? No  Current Medications   Current Facility-Administered Medications:  .  0.9 %  sodium chloride infusion, 250 mL, Intravenous, PRN, Kathleene Hazel, MD .  acetaminophen (TYLENOL) tablet 650 mg, 650 mg, Oral, Q6H PRN, Alberteen Sam, MD, 650 mg at 08/10/17 1324 .  ceftaroline (TEFLARO) 600 mg in sodium chloride 0.9 % 250 mL IVPB, 600 mg, Intravenous, Q12H, Judyann Munson, MD, Stopped at 08/10/17 1100 .  chlorhexidine (PERIDEX) 0.12 % solution 15 mL, 15 mL, Mouth Rinse, BID, McQuaid, Douglas B, MD, 15 mL at 08/10/17 0953 .  DAPTOmycin (CUBICIN) 1,000 mg in sodium chloride 0.9 % IVPB, 1,000 mg, Intravenous, Q24H, Bertram Millard, RPH, Last Rate: 240 mL/hr at 08/10/17 1424, 1,000 mg at 08/10/17 1424 .  feeding supplement (ENSURE ENLIVE) (ENSURE ENLIVE) liquid 237 mL, 237 mL, Oral, BID BM, Hall, Carole N, DO, 237 mL at 08/10/17 1033 .  folic acid (FOLVITE) tablet 1 mg, 1 mg, Oral, Daily, Danford, Earl Lites, MD, 1 mg at 08/10/17 0953 .  furosemide (LASIX) injection 40 mg, 40 mg, Intravenous, Q8H, Lars Masson, MD, 40 mg at 08/10/17 1324 .  hydrALAZINE (APRESOLINE) injection 10-20 mg, 10-20 mg, Intravenous, Q4H PRN, Max Fickle B, MD, 20 mg at 08/02/17 0001 .  HYDROmorphone (DILAUDID) injection 0.5 mg, 0.5 mg, Intravenous, Q4H PRN, Dow Adolph N, DO, 0.5 mg at 08/10/17 0827 .  insulin aspart (novoLOG) injection 0-15 Units, 0-15 Units, Subcutaneous, TID WC, Danford, Earl Lites, MD, 5 Units at 08/10/17 1158 .  insulin aspart (novoLOG) injection 0-5 Units, 0-5 Units, Subcutaneous, QHS, Danford, Christopher P, MD .  insulin glargine (LANTUS) injection 7 Units, 7 Units, Subcutaneous, Daily, Danford, Earl Lites, MD, 7 Units at 08/10/17 [phone] .  ipratropium-albuterol (DUONEB) 0.5-2.5 (3) MG/3ML nebulizer solution 3 mL, 3 mL, Nebulization, Q4H PRN, Smith, Rondell A, MD .  lactated ringers infusion, , Intravenous, Continuous, Cindra Eves F, DDS .  lidocaine (LIDODERM) 5 % 1 patch, 1 patch, Transdermal, Q24H, Darlin Drop, DO, 1 patch at 08/10/17 [phone] .  metoprolol tartrate (LOPRESSOR) tablet 25 mg, 25 mg,  Oral, BID, Kirby-Graham, Beather Arbour, NP, 25 mg at 08/10/17 0953 .  [DISCONTINUED] ondansetron (ZOFRAN) tablet 4 mg, 4 mg, Oral, Q6H PRN **OR** ondansetron (ZOFRAN) injection 4 mg, 4 mg, Intravenous, Q6H PRN, Smith, Rondell A, MD .  potassium chloride 20 MEQ/15ML (10%) solution 20 mEq, 20 mEq, Oral, BID, Lars Masson, MD, 20 mEq at 08/10/17 0953 .  sodium chloride flush (NS) 0.9 % injection 3 mL, 3 mL, Intravenous, Q12H, Kathleene Hazel, MD, 3 mL at 08/09/17 0932 .  sodium chloride flush (NS) 0.9 % injection 3 mL, 3 mL, Intravenous, PRN, Kathleene Hazel, MD .  thiamine (VITAMIN B-1) tablet 100 mg, 100  mg, Oral, Daily, 100 mg at 08/10/17 0953 **OR** [DISCONTINUED] thiamine (B-1) injection 100 mg, 100 mg, Intravenous, Daily, Roslynn AmbleNestor, Jennings E, MD, 100 mg at 08/04/17 [phone]  Patients Current Diet: DIET DYS 3 Room service appropriate? Yes; Fluid consistency: Thin  Precautions / Restrictions Precautions Precautions: Fall Restrictions Weight Bearing Restrictions: No   Has the patient had 2 or more falls or a fall with injury in the past year?No  Prior Activity Level Community (5-7x/wk): Went out daily.  Worked side jobs fixing things.  Was driving.  Home Assistive Devices / Equipment Home Assistive Devices/Equipment: None Home Equipment: None  Prior Device Use: Indicate devices/aids used by the patient prior to current illness, exacerbation or injury? None  Prior Functional Level Prior Function Level of Independence: Independent  Self Care: Did the patient need help bathing, dressing, using the toilet or eating?  Independent  Indoor Mobility: Did the patient need assistance with walking from room to room (with or without device)? Independent  Stairs: Did the patient need assistance with internal or external stairs (with or without device)? Independent  Functional Cognition: Did the patient need help planning regular tasks such as shopping or remembering to  take medications? Independent  Current Functional Level Cognition  Overall Cognitive Status: Impaired/Different from baseline Current Attention Level: Selective Orientation Level: Oriented X4 Following Commands: Follows one step commands consistently, Follows one step commands with increased time Safety/Judgement: Decreased awareness of safety, Decreased awareness of deficits General Comments: Some increased time for processing and decreased attention this session.     Extremity Assessment (includes Sensation/Coordination)  Upper Extremity Assessment: Generalized weakness  Lower Extremity Assessment: Defer to PT evaluation    ADLs  Overall ADL's : Needs assistance/impaired Grooming: Wash/dry hands, Wash/dry face, Standing, Minimal assistance Grooming Details (indicate cue type and reason): Poor balance, unsteady Upper Body Bathing: Min guard, Sitting Lower Body Bathing: Maximal assistance Upper Body Dressing : Min guard, Sitting Lower Body Dressing: Maximal assistance Toilet Transfer: Ambulation, RW, Comfort height toilet, Moderate assistance, +2 for safety/equipment, Cueing for safety Toileting- Clothing Manipulation and Hygiene: Moderate assistance, Sit to/from stand Tub/ Shower Transfer: Moderate assistance, +2 for safety/equipment, Rolling walker, Ambulation, 3 in 1, Grab bars Functional mobility during ADLs: Moderate assistance, +2 for safety/equipment, Rolling walker, Cueing for safety General ADL Comments: verbal cues for correct hand placement, pt encouraged to sit up in recliner vs returning to bed as he requested    Mobility  Overal bed mobility: Needs Assistance Bed Mobility: Supine to Sit Supine to sit: Min assist Sit to supine: +2 for physical assistance, Min assist General bed mobility comments: min assist to bring trunk up to sit EOB     Transfers  Overall transfer level: Needs assistance Equipment used: Rolling walker (2 wheeled) Transfers: Sit to/from  Stand Sit to Stand: Min assist General transfer comment: VC for hand placement and safety     Ambulation / Gait / Stairs / Wheelchair Mobility  Ambulation/Gait Ambulation/Gait assistance: Hydrographic surveyorMin guard Ambulation Distance (Feet): 10 Feet Assistive device: Rolling walker (2 wheeled) Gait Pattern/deviations: Step-through pattern, Decreased step length - right, Decreased step length - left, Shuffle, Trunk flexed General Gait Details: min guard, +2 for equipment and chair follow  Gait velocity: decr Gait velocity interpretation: Below normal speed for age/gender    Posture / Balance Balance Overall balance assessment: Needs assistance Sitting-balance support: Bilateral upper extremity supported, Feet supported Sitting balance-Leahy Scale: Good Standing balance support: Bilateral upper extremity supported, During functional activity Standing balance-Leahy Scale: Fair Standing balance comment:  walker and min assist for static standing    Special needs/care consideration BiPAP/CPAP Diagnosed with sleep apnea, but could not use CPAP  CPM No Continuous Drip IV KVO Dialysis No       Life Vest No Oxygen Yes, currently on 02 5L [place], but was not on 02 at home Special Bed No Trach Size No Wound Vac (area) No     Skin NO                               Bowel mgmt: Last BM 08/07/17 Bladder mgmt: Using urinal Diabetic mgmt Yes, on oral medication at home Contact Precautions:  Yes, MRSA   Previous Home Environment Living Arrangements: Spouse/significant other Available Help at Discharge: Family Type of Home: House Home Layout: One level Home Access: Level entry Bathroom Shower/Tub: Tub/shower unit, Health visitor: Standard Home Care Services: No  Discharge Living Setting Plans for Discharge Living Setting: Alone, Other (Comment)(Lives alone in a camper.) Type of Home at Discharge: Other (Comment)(Camper) Discharge Home Layout: One level Discharge Home Access: Stairs to  enter Entrance Stairs-Number of Steps: 3 Does the patient have any problems obtaining your medications?: No  Social/Family/Support Systems Patient Roles: Parent, Other (Comment)(Has a girlfriend and 4 children "who claim him" per patient.) Contact Information: Claudean Kinds - GF Anticipated Caregiver: Doris Anticipated Caregiver's Contact Information: Everardo All - [phone] Ability/Limitations of Caregiver: Tyler Aas does not live with patient.  She can assist some after discharge. Caregiver Availability: Intermittent Discharge Plan Discussed with Primary Caregiver: Yes Is Caregiver In Agreement with Plan?: Yes Does Caregiver/Family have Issues with Lodging/Transportation while Pt is in Rehab?: No  Goals/Additional Needs Patient/Family Goal for Rehab: PT/OT/SLP mod I and supervision goals Expected length of stay: 12-17 days Cultural Considerations: None Dietary Needs: Dys 3, thin liquids Equipment Needs: TBD Special Service Needs: Needs MV replacement by Dr. Morton Peters in the next 7-10 days as patient becomes stronger and ambulatory. Pt/Family Agrees to Admission and willing to participate: Yes Program Orientation Provided & Reviewed with Pt/Caregiver Including Roles  & Responsibilities: Yes  Decrease burden of Care through IP rehab admission: N/A  Possible need for SNF placement upon discharge: Plans are to discharge to acute care for MV replacement once patient is stronger per Dr. Donata Clay  Patient Condition: This patient's medical and functional status has changed since the consult dated: 08/02/17 in which the Rehabilitation Physician determined and documented that the patient's condition is appropriate for intensive rehabilitative care in an inpatient rehabilitation facility. See "History of Present Illness" (above) for medical update. Functional changes are:  Currently requiring min assist for transfers and minguard assist to ambulate 10 feet RW. Patient's medical and  functional status update has been discussed with the Rehabilitation physician and patient remains appropriate for inpatient rehabilitation. Will admit to inpatient rehab today.  Preadmission Screen Completed By:  Trish Mage, 08/10/2017 2:26 PM ______________________________________________________________________   Discussed status with Dr. Riley Kill on 08/10/17 at 1426 and received telephone approval for admission today.  Admission Coordinator:  Trish Mage, time 1426/Date 08/10/17             Cosigned by: Ranelle Oyster, MD at 08/10/2017 3:15 PM  Revision History

## 2017-08-11 NOTE — Evaluation (Signed)
Occupational Therapy Assessment and Plan  Patient Details  Name: Reginald [name] MRN: [id number] Date of Birth: 29-Sep-1960  OT Diagnosis: abnormal posture, cognitive deficits, muscle weakness (generalized) and pain in joint Rehab Potential:   ELOS: 7-10   Today's Date: 08/11/2017 OT Individual Time: 4235-3614 OT Individual Time Calculation (min): 75 min     Problem List:  Patient Active Problem List   Diagnosis Date Noted  . Septic embolism ([place]) 08/10/2017  . Acute osteomyelitis of cervical spine ([name]) 08/10/2017  . 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 Debility 08/10/2017  . Hypertensive heart disease with heart failure ([place])   . Endotracheal tube present   . Acute respiratory disease   . 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 Acute respiratory failure ([place]) 07/26/2017  . Alcohol abuse 07/26/2017  . Thrombocytopenia ([place]) 07/26/2017  . Abnormal liver enzymes 07/26/2017  . Acute encephalopathy 07/26/2017  . Acute diastolic CHF (congestive heart failure) ([place]) 07/26/2017  . Sepsis ([place])   . Endocarditis of mitral valve   . 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 MRSA bacteremia 07/25/2017    Past Medical History:  Past Medical History:  Diagnosis Date  . Acute osteomyelitis of cervical spine ([place]) 08/10/2017  . Alcohol abuse   . Diabetes mellitus type 2 in obese ([place])   . Neck pain   . 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 Tobacco abuse    Past Surgical History:  Past Surgical History:  Procedure Laterality Date  . HERNIA REPAIR  2007  . MULTIPLE EXTRACTIONS WITH ALVEOLOPLASTY N/A 08/07/2017   Procedure: Extraction of tooth #'s 6,11,12,14,19-29 and 32 with alveoloplasty;  Surgeon: Lenn Cal, DDS;  Location: [facility];  Service: Oral Surgery;  Laterality: N/A;  . RIGHT/LEFT HEART CATH AND CORONARY ANGIOGRAPHY N/A 08/02/2017   Procedure: RIGHT/LEFT HEART CATH AND CORONARY ANGIOGRAPHY;  Surgeon: Burnell Blanks, MD;  Location: [facility];  Service:  Cardiovascular;  Laterality: N/A;  . Tonsillectomy /adnoidectomy     as aa child    Assessment & Plan Clinical Impression:  Trust Hazelwoodis a 56 y.o.malewith history of diastolic CHF, COPD, E3XV, alcohol abuse who was admitted to [facility] with hypoxia and found to have MRSA bacteremia with endocarditis. Hospital course significant for acute on chronic CHF and alcohol withdrawal with confusion with agitation. He was found to have 4.5 cm AAA as well as cervical DDD. He was transferred to [facility] for evaluation and treatment. MRI brain done revealing Multiple small areas of restricted diffusion bilaterally most compatible with septic emboli. Dr. Baxter Flattery consulted for input and recommended continuing antibiotics as well as TEE for work up.  TEE done revealing large mass on anterior mitral leaflet with central regurgitation jet suggestive for possible perforation as well as possible small mass on AV (could be due to resolved endocarditis).   He had issues with agitation requiring precedex but developed obtundation requiring intubation for airway protection on 12/6 and extubated 07/31/2017. Repeat BC 12/7 and 12/10 positive and CT abdomen/chest showed splenic emboli and possible PNA. Rifampin added to ceftaroline/daptomycin on 12/12. Neurology consulted for input and recommended CIWA protocol and treating infection--no ASA needed. CTA head was negative for large or medium vessel stenosis or occlusion. Cardiology following to help manage CHF and he underwent diagnosticcardiac cath 08/01/2017 . MRSA PCR screening positive and continues on contact precautions.  Underwent multiple dental extraction of necrotic teeth 08/07/2017 in preparation for mitral valve replacement with bioprosthetic valve which had initially been scheduled for 08/14/2017 but surgeon raises  valid concerns about patient's degree of conditioning and at this time advised to hold on surgical procedure until patient was able  to tolerate... Follow-up per infectious disease currently is rifampin was discontinued 08/09/2017 and continue with daptomycin and teflaro 6 weeks total and latest blood culture showed no growth. Physical occupational therapy ongoing patient was admitted for a comprehensive rehabilitation program   Patient currently requires min-mod A with basic self-care skills secondary to muscle weakness, decreased cardiorespiratoy endurance, decreased problem solving and decreased memory and decreased standing balance, decreased postural control and decreased balance strategies.  Prior to hospitalization, patient could complete BADL with independent .  Patient will benefit from skilled intervention to increase independence with basic self-care skills prior to discharge home with care partner.  Anticipate patient will require 24 hour supervision and follow up home health.  OT - End of Session Endurance Deficit: Yes Endurance Deficit Description: requires frequent and extended rest breaks throughout session  OT Assessment OT Patient demonstrates impairments in the following area(s): Balance;Cognition;Endurance;Pain;Safety OT Basic ADL's Functional Problem(s): Grooming;Bathing;Dressing;Toileting OT Transfers Functional Problem(s): Toilet;Tub/Shower OT Plan OT Intensity: Minimum of 1-2 x/day, 45 to 90 minutes OT Frequency: 5 out of 7 days OT Duration/Estimated Length of Stay: 7-10 OT Treatment/Interventions: Balance/vestibular training;Community reintegration;Disease mangement/prevention;Functional electrical stimulation;Neuromuscular re-education;Patient/family education;Self Care/advanced ADL retraining;Therapeutic Exercise;UE/LE Coordination activities;Wheelchair propulsion/positioning;Cognitive remediation/compensation;Discharge planning;DME/adaptive equipment instruction;Functional mobility training;Pain management;Psychosocial support;Skin care/wound managment;Therapeutic Activities;UE/LE Strength  taining/ROM OT Self Feeding Anticipated Outcome(s): no goal (I) OT Basic Self-Care Anticipated Outcome(s): MOD I OT Toileting Anticipated Outcome(s): MOD I toileting; S shower OT Bathroom Transfers Anticipated Outcome(s): MOD I toileting; S shower OT Recommendation Patient destination: Home Follow Up Recommendations: Home health OT Equipment Recommended: To be determined;Tub/shower seat Equipment Details: pt reports using walk in shower wiht no seat   Skilled Therapeutic Intervention 1;1. Educated pt on ELOS, OT role/pupose, CIR and goals. OT selects w/c, cushion and BSC for pt and adjusts for pt height while RN administering medication. Pt stand pivot transfer with RW with min A for balance and Vc for safety awareness and RW mangmetn. Pt bathes at sit to stand level at ink with A to wash back and buttocks. Pt able to assume seated figure four for washing B feet, threading pants and donning socks. Pt advances pants pas hips with touchign A for balacne. Pt dons hopspital gown for UB dressing 2/2 IV. Pt transfer to toilet min A using grab bar to void bowel. Pt able to complete clothing management, but requores A for hygiene. Exited session with ptseatd in w/c with call light in reach and all needs met  OT Evaluation Precautions/Restrictions  Precautions Precautions: Fall Restrictions Weight Bearing Restrictions: No General Chart Reviewed: Yes Vital Signs   Pain Pain Assessment Pain Score: 8  Pain Location: Neck Home Living/Prior Functioning Home Living Family/patient expects to be discharged to:: Private residence Living Arrangements: Spouse/significant other Available Help at Discharge: Family Type of Home: House Home Access: Level entry Home Layout: One level Bathroom Shower/Tub: Public librarian, Multimedia programmer: Standard IADL History Current License: Yes Mode of Transportation: Building surveyor  Able to Take Stairs?: Yes Driving: Yes ADL    Vision Baseline Vision/History: Wears glasses Wears Glasses: Reading only Patient Visual Report: No change from baseline Perception  Perception: Within Functional Limits Praxis Praxis: Intact Cognition Overall Cognitive Status: Impaired/Different from baseline Year: 2018 Month: November Day of Week: Incorrect Immediate Memory Recall: Sock;Blue;Bed Memory Recall: Blue Attention: Sustained Sustained Attention: Appears intact Safety/Judgment: Appears intact Sensation Sensation Light Touch:  Appears Intact Proprioception: Appears Intact Coordination Gross Motor Movements are Fluid and Coordinated: Yes Motor  Motor Motor: Within Functional Limits Mobility  Transfers Transfers: Sit to Stand Sit to Stand: 4: Min assist Sit to Stand Details: Visual cues for safe use of DME/AE;Verbal cues for sequencing;Visual cues/gestures for precautions/safety;Verbal cues for technique;Verbal cues for safe use of DME/AE;Manual facilitation for weight shifting  Trunk/Postural Assessment  Cervical Assessment Cervical Assessment: Exceptions to [facility] Thoracic Assessment Thoracic Assessment: Within Functional Limits Lumbar Assessment Lumbar Assessment: Within Functional Limits Postural Control Postural Control: Within Functional Limits  Balance Balance Balance Assessed: Yes Dynamic Sitting Balance Dynamic Sitting - Level of Assistance: 5: Stand by assistance Dynamic Sitting Balance - Compensations: EOB taking morning meds Dynamic Standing Balance Dynamic Standing - Balance Support: Right upper extremity supported Dynamic Standing - Level of Assistance: 4: Min assist Dynamic Standing - Comments: toileting Extremity/Trunk Assessment RUE Assessment RUE Assessment: Exceptions to WFL(generalized weakness) LUE Assessment LUE Assessment: Exceptions to WFL(generalized weakness)   See Function Navigator for Current Functional Status.   Refer to Care Plan for Long Term Goals  Recommendations  for other services: Therapeutic Recreation  Pet therapy   Discharge Criteria: Patient will be discharged from OT if patient refuses treatment 3 consecutive times without medical reason, if treatment goals not met, if there is a change in medical status, if patient makes no progress towards goals or if patient is discharged from hospital.  The above assessment, treatment plan, treatment alternatives and goals were discussed and mutually agreed upon: by patient and by family  Tonny Branch 08/11/2017, 10:27 AM

## 2017-08-11 NOTE — Progress Notes (Signed)
[facility] PHYSICAL MEDICINE & REHABILITATION     PROGRESS NOTE  Subjective/Complaints:  Patient seen lying in bed this morning. He states he slept well overnight. He states he is ready for therapies. Approach by nursing regarding patient's request for oxycodone which he was taking PTA.  ROS: Denies CP, SOB, nausea, vomiting, diarrhea.  Objective: Vital Signs: Blood pressure 125/73, pulse 98, temperature 99.7 F (37.6 C), temperature source Oral, resp. rate 17, height 5\' 9"  (1.753 m), weight 108.8 kg (239 lb 13.8 oz), SpO2 95 %. No results found. Recent Labs    08/10/17 0413 08/11/17 0758  WBC 5.4 6.7  HGB 8.2* 8.2*  HCT 24.9* 24.4*  PLT 280 251   Recent Labs    08/10/17 0413 08/11/17 0758  NA 130* 129*  K 3.5 3.7  CL 95* 94*  GLUCOSE 212* 197*  BUN 13 10  CREATININE 0.71 0.66  CALCIUM 8.5* 8.4*   CBG (last 3)  Recent Labs    08/10/17 2122 08/11/17 0626 08/11/17 1140  GLUCAP 160* 170* 165*    Wt Readings from Last 3 Encounters:  08/11/17 108.8 kg (239 lb 13.8 oz)  08/10/17 107.7 kg (237 lb 6.4 oz)    Physical Exam:  BP 125/73 (BP Location: Left Arm)   Pulse 98   Temp 99.7 F (37.6 C) (Oral)   Resp 17   Ht 5\' 9"  (1.753 m)   Wt 108.8 kg (239 lb 13.8 oz)   SpO2 95%   BMI 35.42 kg/m  Constitutional:  NAD. Obese.  HENT: Normocephalic.  Atraumatic. Poor dentition Cardiovascular: Regular rate and rhythm.  No JVD. Respiratory: Effort normal and breath sounds normal. +[place] GI: Bowel sounds are normal. He exhibits no distension.  Musculoskeletal: He exhibits noedemaor tenderness.  Neurological: He isalertand oriented. He was able to follow simple motor commands without difficulty.  Motor: Bilateral upper extremities: 4/5 proximal to distal  Bilateral lower extremities: 4+/5 proximal to distal  Psych: Flat Skin. Warm and dry  Assessment/Plan: 1. Functional deficits secondary to septic emboli to brain, spine, and muscles which require 3+ hours per day  of interdisciplinary therapy in a comprehensive inpatient rehab setting. Physiatrist is providing close team supervision and 24 hour management of active medical problems listed below. Physiatrist and rehab team continue to assess barriers to discharge/monitor patient progress toward functional and medical goals.  Function:  Bathing Bathing position      Bathing parts Body parts bathed by patient: Right arm, Left arm, Chest, Abdomen, Front perineal area, Right upper leg, Left upper leg, Right lower leg, Left lower leg Body parts bathed by helper: Back, Buttocks  Bathing assist Assist Level: Touching or steadying assistance(Pt > 75%)      Upper Body Dressing/Undressing Upper body dressing   What is the patient wearing?: Hospital gown(IV)                Upper body assist        Lower Body Dressing/Undressing Lower body dressing   What is the patient wearing?: Pants, Non-skid slipper socks     Pants- Performed by patient: Thread/unthread right pants leg, Thread/unthread left pants leg, Pull pants up/down   Non-skid slipper socks- Performed by patient: Don/doff right sock, Don/doff left sock                    Lower body assist Assist for lower body dressing: Touching or steadying assistance (Pt > 75%)      Toileting Toileting   Toileting steps  completed by patient: Adjust clothing prior to toileting, Adjust clothing after toileting Toileting steps completed by helper: Performs perineal hygiene    Toileting assist Assist level: Touching or steadying assistance (Pt.75%)   Transfers Chair/bed transfer             Locomotion Ambulation           Wheelchair          Cognition Comprehension Comprehension assist level: Follows complex conversation/direction with no assist  Expression Expression assist level: Expresses complex ideas: With no assist  Social Interaction Social Interaction assist level: Interacts appropriately with others - No medications  needed.  Problem Solving Problem solving assist level: Solves basic problems with no assist  Memory Memory assist level: Recognizes or recalls 90% of the time/requires cueing < 10% of the time    Medical Problem List and Plan: 1.  Debility secondary to septic emboli to the brain, spine, and muscles.     Begin CIR    Notes reviewed, images reviewed, labs reviewed 2.  DVT Prophylaxis/Anticoagulation: SCDs. Monitor for any signs of DVT 3. Pain Management/chronic pain syndrome: Lidoderm patch, Tylenol as needed   Home oxycodone restarted on 12/21 4. Mood: Provide emotional support 5. Neuropsych: This patient is capable of making decisions on his own behalf. 6. Skin/Wound Care: Routine skin checks 7. Fluids/Electrolytes/Nutrition: Routine I&O's 8. 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 Intravenous Teflaro 600 mg every 12 hours, daptomycin 1000 mg daily 6 weeks total. Confirmed duration with infectious disease 9. Mitral regurgitation. Plan mitral valve replacement after conditioning and rehabilitation program completed per Dr. Zenaida NieceVan Tright 10. Multiple dental extractions 08/07/2017 11. Diabetes mellitus peripheral neuropathy. Lantus insulin 7 units daily. Check blood sugars before meals and at bedtime. Provide diabetic teaching   Monitor with increased mobility 12. Diastolic congestive heart failure. Lasix 40 mg every 8 hours monitor for any signs of fluid overload Filed Weights   08/10/17 1819 08/11/17 0500  Weight: 107.7 kg (237 lb 7 oz) 108.8 kg (239 lb 13.8 oz)  13. COPD/tobacco abuse. Counseling 14. Alcohol abuse. Monitor for any signs of withdrawal 15. Hypertension. Lopressor 25 mg twice a day   Monitor with increased mobility 16. MRSA PCR screening positive. Contact precautions 17. Hyponatremia   Sodium 129 on 12/21   Continue to monitor 18. ABLA   Hemoglobin 8.2 on 12/21   Cont to monitor     LOS (Days) 1 A FACE TO FACE EVALUATION WAS  PERFORMED  Ashtan Girtman Karis Jubanil Rendell Thivierge 08/11/2017 1:14 PM

## 2017-08-12 ENCOUNTER — Inpatient Hospital Stay (HOSPITAL_COMMUNITY): Payer: Self-pay | Admitting: Speech Pathology

## 2017-08-12 ENCOUNTER — Inpatient Hospital Stay (HOSPITAL_COMMUNITY): Payer: Self-pay

## 2017-08-12 ENCOUNTER — Inpatient Hospital Stay (HOSPITAL_COMMUNITY): Payer: Self-pay | Admitting: Physical Therapy

## 2017-08-12 DIAGNOSIS — G441 Vascular headache, not elsewhere classified: Secondary | ICD-10-CM

## 2017-08-12 LAB — GLUCOSE, CAPILLARY
GLUCOSE-CAPILLARY: 225 mg/dL — AB (ref 65–99)
Glucose-Capillary: 164 mg/dL — ABNORMAL HIGH (ref 65–99)
Glucose-Capillary: 173 mg/dL — ABNORMAL HIGH (ref 65–99)
Glucose-Capillary: 187 mg/dL — ABNORMAL HIGH (ref 65–99)

## 2017-08-12 MED ORDER — TOPIRAMATE 25 MG PO TABS
25.0000 mg | ORAL_TABLET | Freq: Two times a day (BID) | ORAL | Status: DC
  Administered 2017-08-12 – 2017-08-13 (×3): 25 mg via ORAL
  Filled 2017-08-12 (×4): qty 1

## 2017-08-12 MED ORDER — INSULIN GLARGINE 100 UNIT/ML ~~LOC~~ SOLN
3.0000 [IU] | Freq: Once | SUBCUTANEOUS | Status: AC
  Administered 2017-08-12: 3 [IU] via SUBCUTANEOUS
  Filled 2017-08-12: qty 0.03

## 2017-08-12 MED ORDER — INSULIN GLARGINE 100 UNIT/ML ~~LOC~~ SOLN
10.0000 [IU] | Freq: Every day | SUBCUTANEOUS | Status: DC
  Administered 2017-08-13 – 2017-08-17 (×5): 10 [IU] via SUBCUTANEOUS
  Filled 2017-08-12 (×5): qty 0.1

## 2017-08-12 MED ORDER — METHOCARBAMOL 500 MG PO TABS
500.0000 mg | ORAL_TABLET | Freq: Four times a day (QID) | ORAL | Status: DC | PRN
Start: 2017-08-12 — End: 2017-08-13
  Administered 2017-08-13 (×2): 500 mg via ORAL
  Filled 2017-08-12 (×2): qty 1

## 2017-08-12 NOTE — Progress Notes (Signed)
Speech Language Pathology Daily Session Note  Patient Details  Name: Reginald [name] MRN: [id number] Date of Birth: 03/04/1961  Today's Date: 08/12/2017 SLP Individual Time: [id number] SLP Individual Time Calculation (min): 45 min  Short Term Goals: Week 1: SLP Short Term Goal 1 (Week 1): STG=LTG due to ELOS  Skilled Therapeutic Interventions:  Pt was seen for skilled ST targeting cognitive goals.  Pt was laying in bed upon therapist's arrival and demonstrated much improved safety awareness when transferring from bed to wheelchair  In comparison to yesterday's evaluation.  SLP facilitated the session with medication management tasks to address recall of new information.  Pt could recall function of his currently scheduled medications for ~75% accuracy with mod I, which improved to 100% accuracy with supervision question cues.  Pt then organized pills into a pill box for 100% accuracy with supervision cues to recognize and correct errors.  Task was not completed due to time constraints; therefore recommend completing pill box at next available appointment.   Pt was left in wheelchair with call bell within reach.  Continue per current plan of care.    Function:  Eating Eating   Modified Consistency Diet: No Eating Assist Level: No help, No cues           Cognition Comprehension Comprehension assist level: Follows complex conversation/direction with no assist  Expression   Expression assist level: Expresses complex ideas: With no assist  Social Interaction Social Interaction assist level: Interacts appropriately 90% of the time - Needs monitoring or encouragement for participation or interaction.  Problem Solving Problem solving assist level: Solves basic 90% of the time/requires cueing < 10% of the time  Memory Memory assist level: Recognizes or recalls 90% of the time/requires cueing < 10% of the time    Pain Pain Assessment Pain Assessment: Faces Pain Score: 7  Faces Pain  Scale: Hurts even more Pain Type: Acute pain Pain Location: Neck Pain Orientation: Proximal Pain Descriptors / Indicators: Aching Pain Onset: On-going Patients Stated Pain Goal: 4 Pain Intervention(s): Other (Comment);RN made aware(pain did not limit participation in therapies )  Therapy/Group: Individual Therapy  Abdallah Hern, Melanee SpryNicole L 08/12/2017, 12:11 PM

## 2017-08-12 NOTE — Progress Notes (Addendum)
[facility] PHYSICAL MEDICINE & REHABILITATION     PROGRESS NOTE  Subjective/Complaints:  Patient seen lying in bed this morning. He states he slept well overnight. He states he had a good first in therapies yesterday. He complains of a headache. When discussed with patient he states he does not oxycodone at home. Later informed by nursing of dorsal foot pain.  ROS: + Headaches. Denies CP, SOB, nausea, vomiting, diarrhea.  Objective: Vital Signs: Blood pressure 121/65, pulse 89, temperature 99 F (37.2 C), temperature source Oral, resp. rate 18, height 5\' 9"  (1.753 m), weight 107.5 kg (236 lb 15.9 oz), SpO2 93 %. No results found. Recent Labs    08/10/17 0413 08/11/17 0758  WBC 5.4 6.7  HGB 8.2* 8.2*  HCT 24.9* 24.4*  PLT 280 251   Recent Labs    08/10/17 0413 08/11/17 0758  NA 130* 129*  K 3.5 3.7  CL 95* 94*  GLUCOSE 212* 197*  BUN 13 10  CREATININE 0.71 0.66  CALCIUM 8.5* 8.4*   CBG (last 3)  Recent Labs    08/11/17 2112 08/12/17 0634 08/12/17 1144  GLUCAP 202* 164* 173*    Wt Readings from Last 3 Encounters:  08/12/17 107.5 kg (236 lb 15.9 oz)  08/10/17 107.7 kg (237 lb 6.4 oz)    Physical Exam:  BP 121/65 (BP Location: Left Arm)   Pulse 89   Temp 99 F (37.2 C) (Oral)   Resp 18   Ht 5\' 9"  (1.753 m)   Wt 107.5 kg (236 lb 15.9 oz)   SpO2 93%   BMI 35.00 kg/m  Constitutional:  NAD. Obese.  HENT: Normocephalic.  atraumatic Cardiovascular: RRR.  No JVD. Respiratory: Effort normal and breath sounds normal. +[facility]: Bowel sounds are normal. He exhibits no distension.  Musculoskeletal: He exhibits noedema.  Neurological: He isalertand oriented. He was able to follow simple motor commands without difficulty.  Motor: Bilateral upper extremities: 4/5 proximal to distal  Bilateral lower extremities: 4+/5 proximal to distal (unchanged) Psych: Flat Skin. Warm and dry  Assessment/Plan: 1. Functional deficits secondary to septic emboli to brain, spine,  and muscles which require 3+ hours per day of interdisciplinary therapy in a comprehensive inpatient rehab setting. Physiatrist is providing close team supervision and 24 hour management of active medical problems listed below. Physiatrist and rehab team continue to assess barriers to discharge/monitor patient progress toward functional and medical goals.  Function:  Bathing Bathing position      Bathing parts Body parts bathed by patient: Right arm, Left arm, Chest, Abdomen, Front perineal area, Right upper leg, Left upper leg, Right lower leg, Left lower leg Body parts bathed by helper: Back, Buttocks  Bathing assist Assist Level: Touching or steadying assistance(Pt > 75%)      Upper Body Dressing/Undressing Upper body dressing   What is the patient wearing?: Hospital gown(IV)                Upper body assist        Lower Body Dressing/Undressing Lower body dressing   What is the patient wearing?: Pants, Non-skid slipper socks     Pants- Performed by patient: Thread/unthread right pants leg, Thread/unthread left pants leg, Pull pants up/down   Non-skid slipper socks- Performed by patient: Don/doff right sock, Don/doff left sock                    Lower body assist Assist for lower body dressing: Touching or steadying assistance (Pt > 75%)  Toileting Toileting   Toileting steps completed by patient: Performs perineal hygiene Toileting steps completed by helper: Adjust clothing prior to toileting, Adjust clothing after toileting    Toileting assist Assist level: Touching or steadying assistance (Pt.75%)   Transfers Chair/bed transfer Chair/bed transfer activity did not occur: N/A Chair/bed transfer method: Ambulatory Chair/bed transfer assist level: Touching or steadying assistance (Pt > 75%) Chair/bed transfer assistive device: Armrests, Walker     Locomotion Ambulation Ambulation activity did not occur: N/A   Max distance: (25 ft) Assist level:  Touching or steadying assistance (Pt > 75%)   Wheelchair          Cognition Comprehension Comprehension assist level: Follows complex conversation/direction with no assist  Expression Expression assist level: Expresses complex ideas: With no assist  Social Interaction Social Interaction assist level: Interacts appropriately 90% of the time - Needs monitoring or encouragement for participation or interaction.  Problem Solving Problem solving assist level: Solves basic 90% of the time/requires cueing < 10% of the time  Memory Memory assist level: Recognizes or recalls 90% of the time/requires cueing < 10% of the time    Medical Problem List and Plan: 1.  Debility secondary to septic emboli to the brain, spine, and muscles.     Continue CIR  2.  DVT Prophylaxis/Anticoagulation: SCDs. Monitor for any signs of DVT 3. Pain Management/chronic pain syndrome: Lidoderm patch, Tylenol as needed   Oxycodone DC'd on 12/22 this patient is not taking this at home   Topamax 25 mg twice a day started on 12/22 4. Mood: Provide emotional support 5. Neuropsych: This patient is capable of making decisions on his own behalf. 6. Skin/Wound Care: Routine skin checks 7. Fluids/Electrolytes/Nutrition: Routine I&O's 8. 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 Intravenous Teflaro 600 mg every 12 hours, daptomycin 1000 mg daily 6 weeks total. Confirmed duration with infectious disease 9. Mitral regurgitation. Plan mitral valve replacement after conditioning and rehabilitation program completed per Dr. Zenaida NieceVan Tright 10. Multiple dental extractions 08/07/2017 11. Diabetes mellitus peripheral neuropathy.    Lantus insulin 7 units daily, increased to 10 Units on 12/22.    Check blood sugars before meals and at bedtime. Provide diabetic teaching   Monitor with increased mobility 12. Diastolic congestive heart failure. Lasix 40 mg every 8 hours monitor for any signs of fluid  overload Filed Weights   08/10/17 1819 08/11/17 0500 08/12/17 0521  Weight: 107.7 kg (237 lb 7 oz) 108.8 kg (239 lb 13.8 oz) 107.5 kg (236 lb 15.9 oz)  13. COPD/tobacco abuse. Counseling 14. Alcohol abuse. Monitor for any signs of withdrawal 15. Hypertension. Lopressor 25 mg twice a day   Monitor with increased mobility 16. MRSA PCR screening positive. Contact precautions 17. Hyponatremia   Sodium 129 on 12/21   Labs ordered for Monday   Continue to monitor 18. ABLA   Hemoglobin 8.2 on 12/21   Labs ordered for Monday   Cont to monitor     LOS (Days) 2 A FACE TO FACE EVALUATION WAS PERFORMED  Aziah Brostrom Karis Jubanil Pasqualina Colasurdo 08/12/2017 12:45 PM

## 2017-08-12 NOTE — Progress Notes (Signed)
Physical Therapy Session Note  Patient Details  Name: Reginald [name] MRN: [id number] Date of Birth: 11-05-1960  Today's Date: 08/12/2017 PT Individual Time: 6579-0383 PT Individual Time Calculation (min): 39 min    Skilled Therapeutic Interventions/Progress Updates:    Session initiated with pt lying supine in bed on 3L O2.  Pt remained on 3L 02 t/o session.  Pt initial Sp02: 95% and HR: 81 bpm.  Session focused on improving independence with mobility to decrease burden of care for D/C to home environment.  Pt transferred supine to sit with HOB elevated with supervision and bed to chair with CGA.  Pt propelled by PT to therapy gym for time management.  Pt then ambulated 2 x 30 ft with RW and prolonged rest breaks due to fatigue.  Pt performed sit to stand repetitions to RW working on increasing performance with use of arm rest for sit to stand vs using b/l UE on walker during transfer.  Additionally focused on eccentric control with stand to sit.  After performing 2 sit to stands, pt reports increased HA.  Pt transported back to room for vitals check:  105/70; HR: 83 bpm; Sp02: 96%.  Pt then agreeable to performing 5 more sit to stand as HA was resolving.  Lunch arrived and pt stated that he had not had a warm meal in 3 days and wished to eat lunch instead of continuing with PT.  Pt was left up in chair with call bell in reach and needs met.  Pt remained on 3L 02 t/o session.  Therapy Documentation Precautions:  Precautions Precautions: Fall Precaution Comments: debility, watch O2 sats Restrictions Weight Bearing Restrictions: No General: PT Amount of Missed Time (min): 21 Minutes PT Missed Treatment Reason: Other (Comment)  See Function Navigator for Current Functional Status.   Therapy/Group: Individual Therapy  Sherilyn Windhorst Hilario Quarry 08/12/2017, 12:29 PM

## 2017-08-12 NOTE — Progress Notes (Signed)
Occupational Therapy Session Note  Patient Details  Name: Reginald [name] MRN: [id number] Date of Birth: 05/07/1961  Today's Date: 08/12/2017 OT Individual Time: [id number] OT Individual Time Calculation (min): 58 min    Short Term Goals: Week 1:  OT Short Term Goal 1 (Week 1): Pt will transfer to BSC/toilet wiht supervision OT Short Term Goal 2 (Week 1): Pt will complete posterior hygiene with CGA OT Short Term Goal 3 (Week 1): Pt will advance pants past hips wiht supervision OT Short Term Goal 4 (Week 1): Pt will wash buttocks wiht CGA for standing balance OT Short Term Goal 5 (Week 1): Pt will groom 2/2 items in standing to improve endurance  Skilled Therapeutic Interventions/Progress Updates:    1:1. Pt declines showering this session. Pt gathers needed clothing items from bag and dresses at sit to stand level at sink. Pt washes underarms and face at sink before donning shirt with set up and pants with CGA for balance while advancing pants past hips. OT educates on energy conservation technique of threading both underwear/pants on BLE prior to standing to eliminate 1 sit to stand. Pt combs hair at sink with set up. OT propels w/c for energy conservation to tx gym. Pt completes 3 rounds horse shoes reaching laterally/crossing midline with last round standing on blue foam pad with up to min A d/t posterior sway. Exited sesison with pt seated in w/c with call light inreach and wife in room.   Therapy Documentation Precautions:  Precautions Precautions: Fall Precaution Comments: debility, watch O2 sats Restrictions Weight Bearing Restrictions: No  See Function Navigator for Current Functional Status.   Therapy/Group: Individual Therapy  Shon HaleStephanie M Jaidee Stipe 08/12/2017, 9:09 AM

## 2017-08-12 NOTE — Progress Notes (Signed)
Occupational Therapy Session Note  Patient Details  Name: Reginald [name] MRN: [id number] Date of Birth: 06/30/1961  Today's Date: 08/12/2017 OT Individual Time: 1300-1330 OT Individual Time Calculation (min): 30 min    Short Term Goals: Week 1:  OT Short Term Goal 1 (Week 1): Pt will transfer to BSC/toilet wiht supervision OT Short Term Goal 2 (Week 1): Pt will complete posterior hygiene with CGA OT Short Term Goal 3 (Week 1): Pt will advance pants past hips wiht supervision OT Short Term Goal 4 (Week 1): Pt will wash buttocks wiht CGA for standing balance OT Short Term Goal 5 (Week 1): Pt will groom 2/2 items in standing to improve endurance  Skilled Therapeutic Interventions/Progress Updates:    1;1. Pt with no c/o pain. Pt reports needing to toilet. Pt ambulates with RW and VC for RW management as pt runs into doorframe on R with RW into bathroom to stand with CGA to void bladder. Pt returns to EOB to don shoes. In tx gym, pt completes 3x30 basketball passes (chest, bounce, and overhead pass) with 1# wrist weights to improve BUE strength and endurance required for BADLs. Exited session with pt supine in bed with call light in reach and bed exit alarm on  Therapy Documentation Precautions:  Precautions Precautions: Fall Precaution Comments: debility, watch O2 sats Restrictions Weight Bearing Restrictions: No  See Function Navigator for Current Functional Status.   Therapy/Group: Individual Therapy  Shon HaleStephanie M Jasey Cortez 08/12/2017, 4:58 PM

## 2017-08-13 ENCOUNTER — Inpatient Hospital Stay (HOSPITAL_COMMUNITY): Payer: Self-pay | Admitting: Occupational Therapy

## 2017-08-13 ENCOUNTER — Inpatient Hospital Stay (HOSPITAL_COMMUNITY): Payer: Medicaid Other

## 2017-08-13 ENCOUNTER — Inpatient Hospital Stay (HOSPITAL_COMMUNITY): Payer: Self-pay

## 2017-08-13 ENCOUNTER — Inpatient Hospital Stay (HOSPITAL_COMMUNITY): Payer: Self-pay | Admitting: Physical Therapy

## 2017-08-13 DIAGNOSIS — M8618 Other acute osteomyelitis, other site: Secondary | ICD-10-CM

## 2017-08-13 DIAGNOSIS — E1149 Type 2 diabetes mellitus with other diabetic neurological complication: Secondary | ICD-10-CM

## 2017-08-13 DIAGNOSIS — I10 Essential (primary) hypertension: Secondary | ICD-10-CM

## 2017-08-13 DIAGNOSIS — I059 Rheumatic mitral valve disease, unspecified: Secondary | ICD-10-CM

## 2017-08-13 DIAGNOSIS — A4902 Methicillin resistant Staphylococcus aureus infection, unspecified site: Secondary | ICD-10-CM

## 2017-08-13 DIAGNOSIS — R5383 Other fatigue: Secondary | ICD-10-CM

## 2017-08-13 DIAGNOSIS — R51 Headache: Secondary | ICD-10-CM

## 2017-08-13 LAB — BASIC METABOLIC PANEL
Anion gap: 9 (ref 5–15)
BUN: 11 mg/dL (ref 6–20)
CALCIUM: 8.1 mg/dL — AB (ref 8.9–10.3)
CO2: 29 mmol/L (ref 22–32)
Chloride: 89 mmol/L — ABNORMAL LOW (ref 101–111)
Creatinine, Ser: 0.71 mg/dL (ref 0.61–1.24)
GFR calc Af Amer: >60 mL/min (ref >60)
GLUCOSE: 233 mg/dL — AB (ref 65–99)
Potassium: 3.4 mmol/L — ABNORMAL LOW (ref 3.5–5.1)
Sodium: 127 mmol/L — ABNORMAL LOW (ref 135–145)

## 2017-08-13 LAB — GLUCOSE, CAPILLARY
GLUCOSE-CAPILLARY: 144 mg/dL — AB (ref 65–99)
GLUCOSE-CAPILLARY: 181 mg/dL — AB (ref 65–99)
Glucose-Capillary: 168 mg/dL — ABNORMAL HIGH (ref 65–99)
Glucose-Capillary: 149 mg/dL — ABNORMAL HIGH (ref 65–99)

## 2017-08-13 MED ORDER — FUROSEMIDE 40 MG PO TABS
40.0000 mg | ORAL_TABLET | Freq: Every day | ORAL | Status: DC
  Administered 2017-08-14 – 2017-08-19 (×6): 40 mg via ORAL
  Filled 2017-08-13: qty 1
  Filled 2017-08-13: qty 2
  Filled 2017-08-13 (×4): qty 1

## 2017-08-13 NOTE — Progress Notes (Signed)
Physical Therapy Session Note  Patient Details  Name: Reginald [name] MRN: [id number] Date of Birth: 02/15/1961  Today's Date: 08/13/2017 PT Individual Time: 0800-0900 PT Individual Time Calculation (min): 60 min   Short Term Goals: Week 1:  PT Short Term Goal 1 (Week 1): =LTGs due to ELOS  Skilled Therapeutic Interventions/Progress Updates:    Pt supine in bed, agreeable to participate in therapy session. Pt has some anterior neck pain with activity, pain is not rated and pt reports pain is tolerable. Bed mobility SBA. Sit to stand CGA to RW. Ambulation 2 x 80 ft with RW and CGA for balance, seated rest between bouts of ambulation. Pt requires 3L O2 with activity, maintains SpO2 at 94% and above. Standing balance and tolerance: bean bag toss with R UE with L UE supported on RW, CGA for balance. Seated BLE therex x 10-15 reps with 1.5# ankle weights. Pt left supine in bed at end of therapy session, reports feeling fatigued, needs in reach.  Therapy Documentation Precautions:  Precautions Precautions: Fall Precaution Comments: debility, watch O2 sats Restrictions Weight Bearing Restrictions: No  See Function Navigator for Current Functional Status.   Therapy/Group: Individual Therapy  Peter Congoaylor Feliciano Wynter, PT, DPT  08/13/2017, 11:50 AM

## 2017-08-13 NOTE — Progress Notes (Signed)
Occupational Therapy Session Note  Patient Details  Name: Reginald [name] MRN: [id number] Date of Birth: 09-19-1960  Today's Date: 08/13/2017 OT Individual Time: 1000-1056 OT Individual Time Calculation (min): 56 min    Short Term Goals: Week 1:  OT Short Term Goal 1 (Week 1): Pt will transfer to BSC/toilet wiht supervision OT Short Term Goal 2 (Week 1): Pt will complete posterior hygiene with CGA OT Short Term Goal 3 (Week 1): Pt will advance pants past hips wiht supervision OT Short Term Goal 4 (Week 1): Pt will wash buttocks wiht CGA for standing balance OT Short Term Goal 5 (Week 1): Pt will groom 2/2 items in standing to improve endurance  Skilled Therapeutic Interventions/Progress Updates:    1;1. No c/o pain. Pt transfers EOB<>w/c<>BSC in shower to bathe at sit to stand level CGA for standing balance when washing buttocks. Pt dresses at sit to stand level at sink with set up for shirt and CGA for LB dressing when advancing pants past hips. Pt able to don footwear crossing BLE into figure 4. Pt requires prolonged rest breaks 2/2 decreased endurance. Pt stands with supervison to brush hair at sink. Pt returns to bed  As stated above. Exited session with pt suine in bed, call lighti nreach and all needs met  Therapy Documentation Precautions:  Precautions Precautions: Fall Precaution Comments: debility, watch O2 sats Restrictions Weight Bearing Restrictions: No General:    See Function Navigator for Current Functional Status.   Therapy/Group: Individual Therapy  Tonny Branch 08/13/2017, 10:20 AM

## 2017-08-13 NOTE — Progress Notes (Signed)
Pharmacy Antibiotic Note  Reginald [name] is a 56 y.o. male admitted on 08/10/2017 with MRSA bacteremia and endocarditis.  Pharmacy has been consulted for Daptomycin dosing. Rifampin d/c'd 12/18, as no prosthetic material requiring use. Repeat BCx 12/13 negative.   Afebrile (Tmax 100.1), WBC WNL. SCr is WNL and CK 12/17 is WNL and stable.  Teeth extraction completed 12/17.  Plan: Continue daptomycin 1gm (~10mg /kg) IV Q24H F/u renal fxn, C&S, clinical status, LOT Weekly CK - due 12/24 F/u MV replacement plan  Height: 5\' 9"  (175.3 cm) Weight: 236 lb 15.9 oz (107.5 kg) IBW/kg (Calculated) : 70.7  Temp (24hrs), Avg:99.2 F (37.3 C), Min:98.2 F (36.8 C), Max:100.1 F (37.8 C)  Recent Labs  Lab 08/07/17 0502 08/08/17 0407 08/09/17 0538 08/10/17 0413 08/11/17 0758 08/13/17 0742  WBC 6.8 7.3 6.5 5.4 6.7  --   CREATININE 0.91 0.93 0.84 0.71 0.66 0.71    Estimated Creatinine Clearance: 123.1 mL/min (by C-G formula based on SCr of 0.71 mg/dL).    Allergies  Allergen Reactions  . Morphine And Related     Antimicrobials this admission: 12/4 [facility] >> 12/10 12/10 ceftaroline >> 12/21 12/12 rifampin>>12/18 12/10 daptomycin >>  Dose adjustments this admission: 12/7 1800 Vancomycin peak level: 42 12/8 0230 Vancomycin trough level: 38  Microbiology results: 11[facility]29[facility] BCx: MRSA (S: bactrim, vanc, dapto, linezolid, TCN) - per Dr. Feliz BeamSnider's note  12/4 BCx: MRSA - Vanc MIC < 0.5 12/5 HIV Ab: negative 12/6 MRSA PCR: positive 12/7 BCx: 2/2 SA 12/10 BCx: MRSA 12/13 BCx: NGTD   Diana L. Marcy Salvoaymond, PharmD, MS PGY1 Pharmacy Resident Clinical phone for 08/13/2017 until 3:30pm: [phone] If after 3:30pm, please call main pharmacy at: [phone]

## 2017-08-13 NOTE — Progress Notes (Signed)
Occupational Therapy Session Note  Patient Details  Name: Reginald [name] MRN: [id number] Date of Birth: 11/19/1960  Today's Date: 08/13/2017 OT Individual Time: [id number] OT Individual Time Calculation (min): 43 min   Short Term Goals: Week 1:  OT Short Term Goal 1 (Week 1): Pt will transfer to BSC/toilet wiht supervision OT Short Term Goal 2 (Week 1): Pt will complete posterior hygiene with CGA OT Short Term Goal 3 (Week 1): Pt will advance pants past hips wiht supervision OT Short Term Goal 4 (Week 1): Pt will wash buttocks wiht CGA for standing balance OT Short Term Goal 5 (Week 1): Pt will groom 2/2 items in standing to improve endurance    Skilled Therapeutic Interventions/Progress Updates:    Tx focus on activity tolerance and cognitive remediation.   Pt greeted supine in bed, reported not feeling well and initially refusing tx. 02 sats, HR and BP WFL. With time, pt agreeable to engage in PVC puzzle assembly EOB. Pt very fatigued by reaching to retrieve pipes from container. Needed to have container laid on side for him to continue participation. Mod cues for sequencing/problem solving during task. Pt disoriented to time and situation, inquiring spouse why he was at hospital. He verbalized feeling too exhausted to continue further. After laterally scooting up in bed he transitioned back to supine. Pt was left with all needs within reach and spouse present.   RN made aware of pts physical c/o.   Therapy Documentation Precautions:  Precautions Precautions: Fall Precaution Comments: debility, watch O2 sats Restrictions Weight Bearing Restrictions: No Pain: Back + neck pain   ADL:      See Function Navigator for Current Functional Status.   Therapy/Group: Individual Therapy  Tashawna Thom A Brien Lowe 08/13/2017, 3:47 PM

## 2017-08-13 NOTE — Progress Notes (Signed)
Physical Therapy Session Note  Patient Details  Name: Reginald [name] MRN: [id number] Date of Birth: 05/28/1961   Today's Date: 08/13/2017 PT Individual Time: 1545-1600 PT Individual Time Calculation (min): 15 min  and Today's Date: 08/13/2017 PT Missed Time: 15 Minutes Missed Time Reason: Patient ill (Comment);Patient fatigue(lethargic, reports not feeling well)  Short Term Goals: Week 1:  PT Short Term Goal 1 (Week 1): =LTGs due to ELOS  Skilled Therapeutic Interventions/Progress Updates:    Pt supine in bed upon PT arrival, RN present given antibiotics. Pt reports not feeling well, he states he just feels "off" today. Pt lethargic and reports feeling tired. Pt agreeable to try exercises at EOB. Pt transferred supine>sitting EOB with supervision, reported some dizziness, vitals stable with no evidence of hypotension. Pt performed 2 x 10 LAQ and x 20 seated marches. Pt reported he needed to lay back down, "I just do not feel good." Pt transferred sitting>supine with supervision. Pt declined further therapy secondary to fatigue and not feeling well. RN present in room for entire session. Pt left supine in bed with needs in reach, missed 15 minutes of skilled therapeutic treatment this session.   Therapy Documentation Precautions:  Precautions Precautions: Fall Precaution Comments: debility, watch O2 sats Restrictions Weight Bearing Restrictions: No   See Function Navigator for Current Functional Status.   Therapy/Group: Individual Therapy  Cresenciano GenreEmily van Schagen, PT, DPT 08/13/2017, 12:59 PM

## 2017-08-13 NOTE — Significant Event (Signed)
Rapid Response Event Note  Overview: Time Called: 1329 Arrival Time: 1350 Event Type: Other (Comment)  Initial Focused Assessment: Called by RN for a second set of eyes.  Rn concerned for increasing lethargy, nonreactive pinpoint pupils and swollen left foot.  On my arrival to patients room , RN and family at bedside.  Patient is lying in bed, easily arousable answers questions appropriately and moves all extremities.  Patient states he is just very tired and has left foot pain, denies chest pain, SOB or any other complaints.   Skin is warm and dry and pale   Interventions:  Left foot evaluated, noted to be more swollen than right foot, is hot to touch, and is sensitive to light touch, able to wiggle toes and has strong pedal pulse.  Left foot appears to be slightly discolored compared to right foot.    Plan of Care (if not transferred):  No RRT interventions at this time.  Recommend RN to update MD on concerns/ findings   Event Summary:  RN to call if assistance needed   at      at          [facility], Maryagnes Amosenise Ann

## 2017-08-13 NOTE — Plan of Care (Signed)
  Not Progressing RH PAIN MANAGEMENT RH STG PAIN MANAGED AT OR BELOW PT'S PAIN GOAL Description Pain  Less than 2  Always rates pain to neck > 2 08/13/2017 0256 - Not Progressing by Martina SinnerMurray, Lavell Ridings A, RN

## 2017-08-13 NOTE — Progress Notes (Addendum)
[place] PHYSICAL MEDICINE & REHABILITATION     PROGRESS NOTE  Subjective/Complaints:  Pt seen laying in bed this AM.  He slept well overnight.  He states he is feeling better.  However, informed by nursing later in the day of increased lethargy and difficulty to arouse.  Significant other requests grounds pass.   ROS: Denies CP, SOB, nausea, vomiting, diarrhea.  Objective: Vital Signs: Blood pressure 110/69, pulse 85, temperature 98.8 F (37.1 C), temperature source Oral, resp. rate 18, height 5\' 9"  (1.753 m), weight 107.5 kg (236 lb 15.9 oz), SpO2 98 %. No results found. Recent Labs    08/11/17 0758  WBC 6.7  HGB 8.2*  HCT 24.4*  PLT 251   Recent Labs    08/11/17 0758 08/13/17 0742  NA 129* 127*  K 3.7 3.4*  CL 94* 89*  GLUCOSE 197* 233*  BUN 10 11  CREATININE 0.66 0.71  CALCIUM 8.4* 8.1*   CBG (last 3)  Recent Labs    08/13/17 0630 08/13/17 1142 08/13/17 1638  GLUCAP 168* 149* 144*    Wt Readings from Last 3 Encounters:  08/13/17 107.5 kg (236 lb 15.9 oz)  08/10/17 107.7 kg (237 lb 6.4 oz)    Physical Exam:  BP 110/69 (BP Location: Left Arm)   Pulse 85   Temp 98.8 F (37.1 C) (Oral)   Resp 18   Ht 5\' 9"  (1.753 m)   Wt 107.5 kg (236 lb 15.9 oz)   SpO2 98%   BMI 35.00 kg/m  Constitutional:  NAD. Obese.  HENT: Normocephalic.  atraumatic Cardiovascular: RRR.  No JVD. Respiratory: Effort normal and breath sounds normal. +[place] GI: Bowel sounds are normal. He exhibits no distension.  Musculoskeletal: He exhibits noedema. TTP left dorsum foot (improved from yesterday).   Neurological: He isalertand oriented. He was able to follow simple motor commands without difficulty.  Motor: Bilateral upper extremities: 4/5 proximal to distal  Bilateral lower extremities: 4/5 proximal to distal Psych: Flat Skin. Warm and dry  Assessment/Plan: 1. Functional deficits secondary to septic emboli to brain, spine, and muscles which require 3+ hours per day of  interdisciplinary therapy in a comprehensive inpatient rehab setting. Physiatrist is providing close team supervision and 24 hour management of active medical problems listed below. Physiatrist and rehab team continue to assess barriers to discharge/monitor patient progress toward functional and medical goals.  Function:  Bathing Bathing position      Bathing parts Body parts bathed by patient: Right arm, Left arm, Chest, Abdomen, Front perineal area, Right upper leg, Left upper leg, Right lower leg, Left lower leg Body parts bathed by helper: Back, Buttocks  Bathing assist Assist Level: Touching or steadying assistance(Pt > 75%)      Upper Body Dressing/Undressing Upper body dressing   What is the patient wearing?: Hospital gown(IV)                Upper body assist        Lower Body Dressing/Undressing Lower body dressing   What is the patient wearing?: Pants, Non-skid slipper socks     Pants- Performed by patient: Thread/unthread right pants leg, Thread/unthread left pants leg, Pull pants up/down   Non-skid slipper socks- Performed by patient: Don/doff right sock, Don/doff left sock                    Lower body assist Assist for lower body dressing: Touching or steadying assistance (Pt > 75%)      Toileting Toileting  Toileting activity did not occur: N/A Toileting steps completed by patient: Performs perineal hygiene Toileting steps completed by helper: Adjust clothing prior to toileting, Adjust clothing after toileting    Toileting assist Assist level: Touching or steadying assistance (Pt.75%)   Transfers Chair/bed transfer Chair/bed transfer activity did not occur: N/A Chair/bed transfer method: Ambulatory Chair/bed transfer assist level: Touching or steadying assistance (Pt > 75%) Chair/bed transfer assistive device: Armrests, Walker     Locomotion Ambulation Ambulation activity did not occur: N/A   Max distance: 80 ft Assist level: Touching or  steadying assistance (Pt > 75%)   Wheelchair          Cognition Comprehension Comprehension assist level: Follows complex conversation/direction with extra time/assistive device  Expression Expression assist level: Expresses basic 90% of the time/requires cueing < 10% of the time.  Social Interaction Social Interaction assist level: Interacts appropriately 90% of the time - Needs monitoring or encouragement for participation or interaction.  Problem Solving Problem solving assist level: Solves basic 75 - 89% of the time/requires cueing 10 - 24% of the time  Memory Memory assist level: Recognizes or recalls 90% of the time/requires cueing < 10% of the time    Medical Problem List and Plan: 1.  Debility secondary to septic emboli to the brain, spine, and muscles.    Continue CIR  2.  DVT Prophylaxis/Anticoagulation: SCDs. Monitor for any signs of DVT 3. Pain Management/chronic pain syndrome: Lidoderm patch, Tylenol as needed   Oxycodone DC'd on 12/22 this patient is not taking this at home   Topamax 25 mg twice a day started on 12/22, d/ced on 12/23 due to increase in lethargy.    Robaxin and Tramadol d/ced due to increase in lethargy.  4. Mood: Provide emotional support 5. Neuropsych: This patient is capable of making decisions on his own behalf. 6. Skin/Wound Care: Routine skin checks 7. Fluids/Electrolytes/Nutrition: Routine I&O's 8. 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 Intravenous Teflaro 600 mg every 12 hours, daptomycin 1000 mg daily 6 weeks total. Confirmed duration with infectious disease 9. Mitral regurgitation. Plan mitral valve replacement after conditioning and rehabilitation program completed per Dr. Zenaida NieceVan Tright 10. Multiple dental extractions 08/07/2017 11. Diabetes mellitus peripheral neuropathy.    Lantus insulin 7 units daily, increased to 10 Units on 12/22.    Check blood sugars before meals and at bedtime. Provide diabetic teaching    Improving control 12. Diastolic congestive heart failure. Lasix 40 mg every 8 hours, decreased to daily due to hyponatremia on 12/23.   Monitor for any signs of fluid overload Filed Weights   08/11/17 0500 08/12/17 0521 08/13/17 0209  Weight: 108.8 kg (239 lb 13.8 oz) 107.5 kg (236 lb 15.9 oz) 107.5 kg (236 lb 15.9 oz)  13. COPD/tobacco abuse. Counseling 14. Alcohol abuse. Monitor for any signs of withdrawal 15. Hypertension. Lopressor 25 mg twice a day   Controlled on 12/23 16. MRSA PCR screening positive. Contact precautions 17. Hyponatremia   Sodium 127 on 12/23, trending down Lasix decreased to daily   Labs ordered for tomorrow   Continue to monitor 18. ABLA   Hemoglobin 8.2 on 12/21   Labs ordered for tomorrow   Cont to monitor 19. Increase in lethargy   Noted after pt returned from ground's pass with pinpoint pupil's per nursing   Urine drug screen ordered   Robaxin, Tramadol, Topamax d/ced     LOS (Days) 3 A FACE TO FACE EVALUATION WAS PERFORMED  Ankit Karis JubaAnil Patel  08/13/2017 5:09 PM

## 2017-08-14 ENCOUNTER — Inpatient Hospital Stay (HOSPITAL_COMMUNITY): Payer: Self-pay | Admitting: Occupational Therapy

## 2017-08-14 ENCOUNTER — Inpatient Hospital Stay (HOSPITAL_COMMUNITY): Payer: Self-pay | Admitting: Physical Therapy

## 2017-08-14 ENCOUNTER — Inpatient Hospital Stay (HOSPITAL_COMMUNITY): Payer: Medicaid Other | Admitting: Speech Pathology

## 2017-08-14 ENCOUNTER — Inpatient Hospital Stay (HOSPITAL_COMMUNITY): Payer: Self-pay

## 2017-08-14 ENCOUNTER — Other Ambulatory Visit (HOSPITAL_COMMUNITY): Payer: Self-pay | Admitting: Respiratory Therapy

## 2017-08-14 LAB — CBC
HEMATOCRIT: 24.5 % — AB (ref 39.0–52.0)
HEMOGLOBIN: 7.9 g/dL — AB (ref 13.0–17.0)
MCH: 29 pg (ref 26.0–34.0)
MCHC: 32.2 g/dL (ref 30.0–36.0)
MCV: 90.1 fL (ref 78.0–100.0)
Platelets: 271 10*3/uL (ref 150–400)
RBC: 2.72 MIL/uL — AB (ref 4.22–5.81)
RDW: 14.7 % (ref 11.5–15.5)
WBC: 8 10*3/uL (ref 4.0–10.5)

## 2017-08-14 LAB — COMPREHENSIVE METABOLIC PANEL
ALBUMIN: 2 g/dL — AB (ref 3.5–5.0)
ALT: 31 U/L (ref 17–63)
AST: 32 U/L (ref 15–41)
Alkaline Phosphatase: 203 U/L — ABNORMAL HIGH (ref 38–126)
Anion gap: 7 (ref 5–15)
BUN: 10 mg/dL (ref 6–20)
CHLORIDE: 93 mmol/L — AB (ref 101–111)
CO2: 32 mmol/L (ref 22–32)
CREATININE: 0.73 mg/dL (ref 0.61–1.24)
Calcium: 8.8 mg/dL — ABNORMAL LOW (ref 8.9–10.3)
GFR calc Af Amer: >60 mL/min (ref >60)
GFR calc non Af Amer: >60 mL/min (ref >60)
Glucose, Bld: 175 mg/dL — ABNORMAL HIGH (ref 65–99)
Potassium: 4.4 mmol/L (ref 3.5–5.1)
SODIUM: 132 mmol/L — AB (ref 135–145)
Total Bilirubin: 1 mg/dL (ref 0.3–1.2)
Total Protein: 7.7 g/dL (ref 6.5–8.1)

## 2017-08-14 LAB — GLUCOSE, CAPILLARY
GLUCOSE-CAPILLARY: 188 mg/dL — AB (ref 65–99)
GLUCOSE-CAPILLARY: 150 mg/dL — AB (ref 65–99)
GLUCOSE-CAPILLARY: 171 mg/dL — AB (ref 65–99)
Glucose-Capillary: 193 mg/dL — ABNORMAL HIGH (ref 65–99)

## 2017-08-14 LAB — CK: CK TOTAL: 17 U/L — AB (ref 49–397)

## 2017-08-14 NOTE — Progress Notes (Signed)
BP at 1935-100/44, erythema to left foot-dorsum aspect, edema and tender to touch, edema to LLE,  poor intake today R/T lethargy. Dr. Allena KatzPatel made aware, of assessment. Orders to hold scheduled lopressor and order for xray of left foot. To radiology at 2330. Urine spec.sent around 0200.  Awake and restless most of night. Complained of posterior neck pain, PRN tylenol given at 0053 and heat pack applied. At 2200 complained of feeling SOB, PRN NEB treatment given with "some" relief. O2 at 3 L/M, congested productive cough.  Alfredo MartinezMurray, Angeliz Settlemyre A

## 2017-08-14 NOTE — Progress Notes (Signed)
Physical Therapy Session Note  Patient Details  Name: Reginald [name] Virgin MRN: [id number] Date of Birth: 04/10/1961  Today's Date: 08/14/2017 PT Individual Time: [id number] PT Individual Time Calculation (min): 30 min   Short Term Goals: Week 1:  PT Short Term Goal 1 (Week 1): =LTGs due to ELOS  Skilled Therapeutic Interventions/Progress Updates:    no c/o pain, but fatigued.  Session focus on family education for bathroom transfers.  PT demonstrated to wife Tyler Aas(Doris) and she returned demo for ambulation to/from bathroom managing IV pole with min verbal cues for proximity to pt.  Pt reporting dizziness with standing, assessed orthostatic vitals with no appreciable change (116/69 > 111/66 sit >stand).  RN aware.  HR at rest 100.  Pt requesting to return to bed, missed 15 minutes of skilled PT.    Therapy Documentation Precautions:  Precautions Precautions: Fall Precaution Comments: debility, watch O2 sats Restrictions Weight Bearing Restrictions: No General: PT Amount of Missed Time (min): 15 Minutes PT Missed Treatment Reason: Patient ill (Comment);Patient fatigue(increased RR/HR, dizziness)   See Function Navigator for Current Functional Status.   Therapy/Group: Individual Therapy  Stephania FragminCaitlin E Keimora Swartout 08/14/2017, 3:45 PM

## 2017-08-14 NOTE — Progress Notes (Signed)
Orthopedic Tech Progress Note Patient Details:  Reginald [name] 06/09/1961 [id number]  Ortho Devices Type of Ortho Device: CAM walker       Saul FordyceJennifer C Arles Rumbold 08/14/2017, 11:50 AM

## 2017-08-14 NOTE — Progress Notes (Signed)
Speech Language Pathology Daily Session Note  Patient Details  Name: [name] MRN: [id number] Date of Birth: 03/10/1961  Today's Date: 08/14/2017 SLP Individual Time: [id number] SLP Individual Time Calculation (min): 25 min  Short Term Goals: Week 1: SLP Short Term Goal 1 (Week 1): STG=LTG due to ELOS  Skilled Therapeutic Interventions: Skilled treatment session focused on cognitive goals. SLP facilitated session by providing supervision verbal cues for patient to alternate attention between self-feeding and functional conversation for ~10 minutes. Patient also completed a mildly complex medication management task with Mod I. Patient left upright in bed with all needs within reach. Continue with current plan of care.      Function:   Cognition Comprehension Comprehension assist level: Follows complex conversation/direction with extra time/assistive device  Expression   Expression assist level: Expresses basic 90% of the time/requires cueing < 10% of the time.  Social Interaction Social Interaction assist level: Interacts appropriately 90% of the time - Needs monitoring or encouragement for participation or interaction.  Problem Solving Problem solving assist level: Solves complex 90% of the time/cues < 10% of the time  Memory Memory assist level: Recognizes or recalls 90% of the time/requires cueing < 10% of the time    Pain No/Denies Pain   Therapy/Group: Individual Therapy  Tayna Smethurst 08/14/2017, 11:21 AM

## 2017-08-14 NOTE — Progress Notes (Addendum)
[place] PHYSICAL MEDICINE & REHABILITATION     PROGRESS NOTE  Subjective/Complaints:   DIscussed with on call MD, pt was reported to be lethargic after pass yesterday,  UDS ordered but not completed, reordered 12/24 Discussed foot xray- little pain per pt, per ortho  Ok for CAM boot WBAT  ROS: Denies CP, SOB, nausea, vomiting, diarrhea.  Objective: Vital Signs: Blood pressure 116/66, pulse 82, temperature 98 F (36.7 C), temperature source Oral, resp. rate 18, height 5\' 9"  (1.753 m), weight 108.7 kg (239 lb 10.2 oz), SpO2 90 %. Dg Foot Complete Left  Result Date: 08/14/2017 CLINICAL DATA:  56 year old male with left foot pain. No known injury. EXAM: LEFT FOOT - COMPLETE 3+ VIEW COMPARISON:  None. FINDINGS: There is a transverse fracture of the base of the proximal phalanx of the fifth digit which is age indeterminate. There appears to be some adjacent bone reaction and callus formation. These fracture is favored to represent a subacute or chronic fracture with incomplete healing or nonunion and less likely an acute fracture. Clinical correlation is recommended. No other fracture identified. There is no dislocation. There are degenerative changes of the ankle joint. There is diffuse soft tissue swelling of the dorsum and lateral aspect of the foot. No radiopaque foreign object or soft tissue gas. IMPRESSION: Fracture of the base of the proximal phalanx of the fifth digit as described. Clinical correlation is recommended. Diffuse soft tissue swelling of the lateral and dorsal foot. Electronically Signed   By: Elgie Collard M.D.   On: 08/14/2017 00:02   No results for input(s): WBC, HGB, HCT, PLT in the last 72 hours. Recent Labs    08/13/17 0742 08/14/17 0525  NA 127* 132*  K 3.4* 4.4  CL 89* 93*  GLUCOSE 233* 175*  BUN 11 10  CREATININE 0.71 0.73  CALCIUM 8.1* 8.8*   CBG (last 3)  Recent Labs    08/13/17 2058 08/14/17 0626 08/14/17 1155  GLUCAP 181* 193* 188*    Wt  Readings from Last 3 Encounters:  08/14/17 108.7 kg (239 lb 10.2 oz)  08/10/17 107.7 kg (237 lb 6.4 oz)    Physical Exam:  BP 116/66   Pulse 82   Temp 98 F (36.7 C) (Oral)   Resp 18   Ht 5\' 9"  (1.753 m)   Wt 108.7 kg (239 lb 10.2 oz)   SpO2 90%   BMI 35.39 kg/m  Constitutional:  NAD. Obese.  HENT: Normocephalic.  atraumatic Cardiovascular: RRR.  No JVD. Respiratory: Effort normal and breath sounds normal. +[place] GI: Bowel sounds are normal. He exhibits no distension.  Musculoskeletal: He exhibits noedema. TTP left dorsum foot base of L 5th metatarsal Neurological: He isalertand oriented. He was able to follow simple motor commands without difficulty.  Motor: Bilateral upper extremities: 4/5 proximal to distal  Bilateral lower extremities: 4/5 proximal to distal Psych: Flat Skin. Warm and dry, no erythema over the left foot  Assessment/Plan: 1. Functional deficits secondary to septic emboli to brain, spine, and muscles which require 3+ hours per day of interdisciplinary therapy in a comprehensive inpatient rehab setting. Physiatrist is providing close team supervision and 24 hour management of active medical problems listed below. Physiatrist and rehab team continue to assess barriers to discharge/monitor patient progress toward functional and medical goals.  Function:  Bathing Bathing position      Bathing parts Body parts bathed by patient: Right arm, Left arm, Chest, Abdomen, Front perineal area, Right upper leg, Left upper leg, Right  lower leg, Left lower leg Body parts bathed by helper: Back, Buttocks  Bathing assist Assist Level: Touching or steadying assistance(Pt > 75%)      Upper Body Dressing/Undressing Upper body dressing   What is the patient wearing?: Hospital gown(IV)                Upper body assist        Lower Body Dressing/Undressing Lower body dressing   What is the patient wearing?: Pants, Non-skid slipper socks     Pants- Performed  by patient: Thread/unthread right pants leg, Thread/unthread left pants leg Pants- Performed by helper: Pull pants up/down(d/t current NWB precutions on L foot limiting balance) Non-skid slipper socks- Performed by patient: Don/doff right sock, Don/doff left sock                    Lower body assist Assist for lower body dressing: Touching or steadying assistance (Pt > 75%)      Toileting Toileting Toileting activity did not occur: N/A Toileting steps completed by patient: Performs perineal hygiene Toileting steps completed by helper: Adjust clothing prior to toileting, Adjust clothing after toileting    Toileting assist Assist level: Touching or steadying assistance (Pt.75%)   Transfers Chair/bed transfer Chair/bed transfer activity did not occur: N/A Chair/bed transfer method: Ambulatory Chair/bed transfer assist level: Touching or steadying assistance (Pt > 75%) Chair/bed transfer assistive device: Armrests, Patent attorneyWalker     Locomotion Ambulation Ambulation activity did not occur: N/A   Max distance: 80 ft Assist level: Touching or steadying assistance (Pt > 75%)   Wheelchair          Cognition Comprehension Comprehension assist level: Follows complex conversation/direction with extra time/assistive device  Expression Expression assist level: Expresses basic 90% of the time/requires cueing < 10% of the time.  Social Interaction Social Interaction assist level: Interacts appropriately 90% of the time - Needs monitoring or encouragement for participation or interaction.  Problem Solving Problem solving assist level: Solves complex 90% of the time/cues < 10% of the time  Memory Memory assist level: Recognizes or recalls 90% of the time/requires cueing < 10% of the time    Medical Problem List and Plan: 1.  Debility secondary to septic emboli to the brain, spine, and muscles.    Continue CIR  2.  DVT Prophylaxis/Anticoagulation: SCDs. Monitor for any signs of DVT 3. Pain  Management/chronic pain syndrome: Lidoderm patch, Tylenol as needed   Oxycodone DC'd on 12/22 this patient is not taking this at home   Topamax 25 mg twice a day started on 12/22, d/ced on 12/23 due to increase in lethargy.    Robaxin and Tramadol d/ced due to increase in lethargy.- pt is alert today  4. Mood: Provide emotional support 5. Neuropsych: This patient is capable of making decisions on his own behalf. 6. Skin/Wound Care: Routine skin checks 7. Fluids/Electrolytes/Nutrition: Routine I&O's 8. 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 Intravenous Teflaro 600 mg every 12 hours, daptomycin 1000 mg daily 6 weeks total. Confirmed duration with infectious disease 9. Mitral regurgitation. Plan mitral valve replacement after conditioning and rehabilitation program completed per Dr. Zenaida NieceVan Tright 10. Multiple dental extractions 08/07/2017 11. Diabetes mellitus peripheral neuropathy.    Lantus insulin , increased to 12 Units on 12/24.    Check blood sugars before meals and at bedtime. Provide diabetic teaching    CBG (last 3)  Recent Labs    08/13/17 2058 08/14/17 0626 08/14/17 1155  GLUCAP 181*  193* 188*   12. Diastolic congestive heart failure. Lasix 40 mg every 8 hours, decreased to daily due to hyponatremia on 12/23.   Monitor for any signs of fluid overload Filed Weights   08/12/17 0521 08/13/17 0209 08/14/17 0322  Weight: 107.5 kg (236 lb 15.9 oz) 107.5 kg (236 lb 15.9 oz) 108.7 kg (239 lb 10.2 oz)  13. COPD/tobacco abuse. Counseling 14. Alcohol abuse. Monitor for any signs of withdrawal 15. Hypertension. Lopressor 25 mg twice a day   Controlled on 12/23 16. MRSA PCR screening positive. Contact precautions 17. Hyponatremia   Sodium 127 on 12/23, improved to 132 12/24 Lasix decreased to daily      Continue to monitor 18. ABLA   Hemoglobin 8.2 on 12/21   Labs ordered , difficult stick   Cont to monitor 19. Increase in lethargy   Noted after  pt returned from ground's pass with pinpoint pupil's per nursing- pt denied going on a grounds pass   Urine drug screen ordered   Robaxin, Tramadol, Topamax d/ced     LOS (Days) 4 A FACE TO FACE EVALUATION WAS PERFORMED  Erick Colacendrew E Ginna Schuur 08/14/2017 12:50 PM

## 2017-08-14 NOTE — Progress Notes (Signed)
Occupational Therapy Session Note  Patient Details  Name: Reginald [name] Knack MRN: [id number] Date of Birth: 07/21/1961  Today's Date: 08/14/2017 OT Individual Time: 1105-1200 OT Individual Time Calculation (min): 55 min    Short Term Goals: Week 1:  OT Short Term Goal 1 (Week 1): Pt will transfer to BSC/toilet wiht supervision OT Short Term Goal 2 (Week 1): Pt will complete posterior hygiene with CGA OT Short Term Goal 3 (Week 1): Pt will advance pants past hips wiht supervision OT Short Term Goal 4 (Week 1): Pt will wash buttocks wiht CGA for standing balance OT Short Term Goal 5 (Week 1): Pt will groom 2/2 items in standing to improve endurance  Skilled Therapeutic Interventions/Progress Updates:    Pt received supine in bed with no c/o pain, agreeable to OT tx session. Pt completes bed mobility with S, completes squat pivot EOB>w/c with MinA to the R, Pt having increased difficulty maintaining NWB in LLE during transfer. Pt self propels w/c approx 50% distance to therapy gym with one rest break. Assisted Pt total assist remaining 50% for energy conservation. Pt engages in seated peg board activity, completing 2/2 puzzles with min verbal/questioning cues for accuracy. Pt engages in UE strengthening/endurance activities propelling 3min on arm bike prior to needing seated rest break. Pt completes bil UE exercises using 1# dowel rod for 10 reps each exercise across all planes. Returned to room total assist for time management, assisted Pt in donning CAM boot to LLE which was delivered during session. Pt left end of session seated in w/c, lunch tray set up, call bell and needs within reach, spouse present.   Therapy Documentation Precautions:  Precautions Precautions: Fall Precaution Comments: debility, watch O2 sats Restrictions Weight Bearing Restrictions: No  See Function Navigator for Current Functional Status.   Therapy/Group: Individual Therapy  Orlando PennerBreanna L Troyce Febo 08/14/2017, 1:33  PM

## 2017-08-14 NOTE — Progress Notes (Signed)
Occupational Therapy Session Note  Patient Details  Name: Reginald [name] Febles MRN: [id number] Date of Birth: 09/23/1960  Today's Date: 08/14/2017 OT Individual Time: 0900-1000 OT Individual Time Calculation (min): 60 min    Short Term Goals: Week 1:  OT Short Term Goal 1 (Week 1): Pt will transfer to BSC/toilet wiht supervision OT Short Term Goal 2 (Week 1): Pt will complete posterior hygiene with CGA OT Short Term Goal 3 (Week 1): Pt will advance pants past hips wiht supervision OT Short Term Goal 4 (Week 1): Pt will wash buttocks wiht CGA for standing balance OT Short Term Goal 5 (Week 1): Pt will groom 2/2 items in standing to improve endurance  Skilled Therapeutic Interventions/Progress Updates:    1;1. OT searching for MD for orders on LLE WB status since xray determined fractured. This OT received verbal order from MD for NWB LLE. Pt educated on new WB status and became discouraged saying, "of course something else has to go wrong." OT provided support and encouragement and ensured foot would heal as long as pt abides by precautions. Pt verbalized understanding, however throughout session, pt required constant cueing for WB precautions through sit to stand/stand pivot transfers with min A with RW and pt continues to put weight through L foot. Pt washes UB and peri area with pt able to maintain precautions in static standing while OT washes buttocks and advances pants past hips. Pt dons button up shirt with set up and underwear/pants with OT advancing pants past hips. Exited session with pt seated in w/c with wife present awaiting doctor for rounds to answer questions.   Therapy Documentation Precautions:  Precautions Precautions: Fall Precaution Comments: debility, watch O2 sats Restrictions Weight Bearing Restrictions: No General:    See Function Navigator for Current Functional Status.   Therapy/Group: Individual Therapy  Shon HaleStephanie M Ayumi Wangerin 08/14/2017, 9:35 AM

## 2017-08-14 NOTE — Progress Notes (Signed)
X-ray of left foot showed fracture base of proximal phalanx of the fifth digit.discussed with orthopedic services plan conservative care patient to be in a walking boot and weightbearing as tolerated

## 2017-08-15 LAB — GLUCOSE, CAPILLARY
GLUCOSE-CAPILLARY: 171 mg/dL — AB (ref 65–99)
GLUCOSE-CAPILLARY: 180 mg/dL — AB (ref 65–99)
GLUCOSE-CAPILLARY: 118 mg/dL — AB (ref 65–99)
Glucose-Capillary: 169 mg/dL — ABNORMAL HIGH (ref 65–99)

## 2017-08-15 MED ORDER — BUDESONIDE 0.25 MG/2ML IN SUSP
0.2500 mg | Freq: Two times a day (BID) | RESPIRATORY_TRACT | Status: DC
  Administered 2017-08-15 – 2017-08-19 (×10): 0.25 mg via RESPIRATORY_TRACT
  Filled 2017-08-15 (×10): qty 2

## 2017-08-15 NOTE — Progress Notes (Signed)
[facility] PHYSICAL MEDICINE & REHABILITATION     PROGRESS NOTE  Subjective/Complaints:   No foot pain, pt states he gets anxious and SOB "days and nights mixed up" ROS: Denies CP, SOB, nausea, vomiting, diarrhea.  Objective: Vital Signs: Blood pressure 119/73, pulse 92, temperature 98.4 F (36.9 C), temperature source Oral, resp. rate 17, height 5\' 9"  (1.753 m), weight 108.5 kg (239 lb 3.2 oz), SpO2 97 %. Dg Foot Complete Left  Result Date: 08/14/2017 CLINICAL DATA:  56 year old male with left foot pain. No known injury. EXAM: LEFT FOOT - COMPLETE 3+ VIEW COMPARISON:  None. FINDINGS: There is a transverse fracture of the base of the proximal phalanx of the fifth digit which is age indeterminate. There appears to be some adjacent bone reaction and callus formation. These fracture is favored to represent a subacute or chronic fracture with incomplete healing or nonunion and less likely an acute fracture. Clinical correlation is recommended. No other fracture identified. There is no dislocation. There are degenerative changes of the ankle joint. There is diffuse soft tissue swelling of the dorsum and lateral aspect of the foot. No radiopaque foreign object or soft tissue gas. IMPRESSION: Fracture of the base of the proximal phalanx of the fifth digit as described. Clinical correlation is recommended. Diffuse soft tissue swelling of the lateral and dorsal foot. Electronically Signed   By: Elgie CollardArash  Radparvar M.D.   On: 08/14/2017 00:02   Recent Labs    08/14/17 1245  WBC 8.0  HGB 7.9*  HCT 24.5*  PLT 271   Recent Labs    08/13/17 0742 08/14/17 0525  NA 127* 132*  K 3.4* 4.4  CL 89* 93*  GLUCOSE 233* 175*  BUN 11 10  CREATININE 0.71 0.73  CALCIUM 8.1* 8.8*   CBG (last 3)  Recent Labs    08/14/17 1637 08/14/17 2124 08/15/17 0627  GLUCAP 150* 171* 171*    Wt Readings from Last 3 Encounters:  08/15/17 108.5 kg (239 lb 3.2 oz)  08/10/17 107.7 kg (237 lb 6.4 oz)    Physical  Exam:  BP 119/73 (BP Location: Right Arm)   Pulse 92   Temp 98.4 F (36.9 C) (Oral)   Resp 17   Ht 5\' 9"  (1.753 m)   Wt 108.5 kg (239 lb 3.2 oz)   SpO2 97%   BMI 35.32 kg/m  Constitutional:  NAD. Obese.  HENT: Normocephalic.  atraumatic Cardiovascular: RRR.  No JVD. Respiratory: Effort normal and breath sounds normal. +[place] GI: Bowel sounds are normal. He exhibits no distension.  Musculoskeletal: He exhibits noedema. TTP left dorsum foot base of L 5th metatarsal Neurological: He isalertand oriented. He was able to follow simple motor commands without difficulty.  Motor: Bilateral upper extremities: 4/5 proximal to distal  Bilateral lower extremities: 4/5 proximal to distal Psych: Flat Skin. Warm and dry, no erythema over the left foot  Assessment/Plan: 1. Functional deficits secondary to septic emboli to brain, spine, and muscles which require 3+ hours per day of interdisciplinary therapy in a comprehensive inpatient rehab setting. Physiatrist is providing close team supervision and 24 hour management of active medical problems listed below. Physiatrist and rehab team continue to assess barriers to discharge/monitor patient progress toward functional and medical goals.  Function:  Bathing Bathing position      Bathing parts Body parts bathed by patient: Right arm, Left arm, Chest, Abdomen, Front perineal area, Right upper leg, Left upper leg, Right lower leg, Left lower leg Body parts bathed by helper: Back,  Buttocks  Bathing assist Assist Level: Touching or steadying assistance(Pt > 75%)      Upper Body Dressing/Undressing Upper body dressing   What is the patient wearing?: Hospital gown(IV)                Upper body assist        Lower Body Dressing/Undressing Lower body dressing   What is the patient wearing?: Pants, Non-skid slipper socks     Pants- Performed by patient: Thread/unthread right pants leg, Thread/unthread left pants leg Pants- Performed by  helper: Pull pants up/down(d/t current NWB precutions on L foot limiting balance) Non-skid slipper socks- Performed by patient: Don/doff right sock, Don/doff left sock                    Lower body assist Assist for lower body dressing: Touching or steadying assistance (Pt > 75%)      Toileting Toileting Toileting activity did not occur: N/A Toileting steps completed by patient: Performs perineal hygiene Toileting steps completed by helper: Adjust clothing prior to toileting, Adjust clothing after toileting    Toileting assist Assist level: Touching or steadying assistance (Pt.75%)   Transfers Chair/bed transfer Chair/bed transfer activity did not occur: N/A Chair/bed transfer method: Ambulatory Chair/bed transfer assist level: Touching or steadying assistance (Pt > 75%) Chair/bed transfer assistive device: Armrests, Patent attorney Ambulation activity did not occur: N/A   Max distance: 80 ft Assist level: Touching or steadying assistance (Pt > 75%)   Wheelchair          Cognition Comprehension Comprehension assist level: Follows complex conversation/direction with extra time/assistive device  Expression Expression assist level: Expresses basic 90% of the time/requires cueing < 10% of the time.  Social Interaction Social Interaction assist level: Interacts appropriately 90% of the time - Needs monitoring or encouragement for participation or interaction.  Problem Solving Problem solving assist level: Solves complex 90% of the time/cues < 10% of the time  Memory Memory assist level: Recognizes or recalls 90% of the time/requires cueing < 10% of the time    Medical Problem List and Plan: 1.  Debility secondary to septic emboli to the brain, spine, and muscles.    Continue CIR  2.  DVT Prophylaxis/Anticoagulation: SCDs. Monitor for any signs of DVT 3. Pain Management/chronic pain syndrome: Lidoderm patch, Tylenol as needed   Oxycodone DC'd on 12/22 this  patient is not taking this at home   Topamax 25 mg twice a day started on 12/22, d/ced on 12/23 due to increase in lethargy.    Robaxin and Tramadol d/ced due to increase in lethargy.- pt is alert today  4. Mood: Provide emotional support 5. Neuropsych: This patient is capable of making decisions on his own behalf. 6. Skin/Wound Care: Routine skin checks 7. Fluids/Electrolytes/Nutrition: Routine I&O's 8. 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 Intravenous Teflaro 600 mg every 12 hours, daptomycin 1000 mg daily 6 weeks total. Confirmed duration with infectious disease 9. Mitral regurgitation. Plan mitral valve replacement after conditioning and rehabilitation program completed per Dr. Zenaida Niece Tright 10. Multiple dental extractions 08/07/2017 11. Diabetes mellitus peripheral neuropathy.    Lantus insulin , increased to 12 Units on 12/24.    Check blood sugars before meals and at bedtime. Provide diabetic teaching    CBG (last 3)  Recent Labs    08/14/17 1637 08/14/17 2124 08/15/17 0627  GLUCAP 150* 171* 171*  Controlled 12/25 12. Diastolic congestive heart failure. Lasix  40 mg every 8 hours, decreased to daily due to hyponatremia on 12/23.   Monitor for any signs of fluid overload Filed Weights   08/13/17 0209 08/14/17 0322 08/15/17 0150  Weight: 107.5 kg (236 lb 15.9 oz) 108.7 kg (239 lb 10.2 oz) 108.5 kg (239 lb 3.2 oz)  13. COPD/tobacco abuse. Counseling  14. Alcohol abuse. Monitor for any signs of withdrawal 15. Hypertension. Lopressor 25 mg twice a day   Controlled on 12/25 Vitals:   08/14/17 2120 08/15/17 0150  BP: 117/62 119/73  Pulse: 91 92  Resp:  17  Temp:  98.4 F (36.9 C)  SpO2:  97%   16. MRSA PCR screening positive. Contact precautions 17. Hyponatremia   Sodium 127 on 12/23, improved to 132 12/24 Lasix decreased to daily      Continue to monitor 18. ABLA- oral surgery   Hemoglobin 8.2 on 12/21, 7.9 on 12/24   Repeat in am,  check stool guaic and Fe studies   Cont to monitor 19. Increase in lethargy   Noted after pt returned from ground's pass with pinpoint pupil's per nursing- pt denied going on a grounds pass   Urine drug screen ordered   Robaxin, Tramadol, Topamax d/ced     LOS (Days) 5 A FACE TO FACE EVALUATION WAS PERFORMED  Erick Colacendrew E Christopherjohn Schiele 08/15/2017 9:37 AM

## 2017-08-15 NOTE — Progress Notes (Signed)
  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 Patient was only able to ambulate approximately 10 feet with a walker before becoming winded. He is not strong enough yet for surgery later this week. Plan will be continued inpatient rehabilitation for strengthening and surgery in early January.  Objective: Vital signs in last 24 hours: Temp:  [98.4 F (36.9 C)] 98.4 F (36.9 C) (12/25 0150) Pulse Rate:  [80-92] 80 (12/25 1001) Resp:  [16-17] 16 (12/25 1001) BP: (117-119)/(62-73) 119/73 (12/25 0150) SpO2:  [95 %-100 %] 100 % (12/25 1001) Weight:  [239 lb 3.2 oz (108.5 kg)] 239 lb 3.2 oz (108.5 kg) (12/25 0150)  Hemodynamic parameters for last 24 hours:    Intake/Output from previous day: 12/24 0701 - 12/25 0700 In: 240 [P.O.:240] Out: 400 [Urine:400] Intake/Output this shift: Total I/O In: 360 [P.O.:360] Out: -   Chronically ill-appearing Scattered rhonchi 2/6 holosystolic murmur to left apex Mild ankle edema  Lab Results: Recent Labs    08/14/17 1245  WBC 8.0  HGB 7.9*  HCT 24.5*  PLT 271   BMET:  Recent Labs    08/13/17 0742 08/14/17 0525  NA 127* 132*  K 3.4* 4.4  CL 89* 93*  CO2 29 32  GLUCOSE 233* 175*  BUN 11 10  CREATININE 0.71 0.73  CALCIUM 8.1* 8.8*    PT/INR: No results for input(s): LABPROT, INR in the last 72 hours. ABG    Component Value Date/Time   PHART 7.505 (H) 08/02/2017 1553   HCO3 29.9 (H) 08/02/2017 1553   TCO2 31 08/02/2017 1553   O2SAT 93.0 08/02/2017 1553   CBG (last 3)  Recent Labs    08/14/17 1637 08/14/17 2124 08/15/17 0627  GLUCAP 150* 171* 171*    Assessment/Plan: S/P  CHF from severe MR, endocarditis  anemia of chronic disease-would benefit from one unit of packed cell transfusion Not yet ready for cardiac surgery-continue prehab therapies Continue  Cubicin to treat MRSA bacteremia  LOS: 5 days    Kathlee Nationseter Van Trigt III 08/15/2017

## 2017-08-15 NOTE — Progress Notes (Signed)
RT at bedside of pt family member asking for a breathing treatment. Pt all night has denied it once you get here and says he just wants to sleep. Pt family member is insisting that pt has a treatment and calls around the clock for them. Pt is clear and is saturating 97% and has been throughout the night. Nurse and RT have been alternating giving treatments due to the calls from the family requesting treatments. Rt will follow up per protocol.

## 2017-08-16 ENCOUNTER — Inpatient Hospital Stay (HOSPITAL_COMMUNITY): Payer: Medicaid Other

## 2017-08-16 ENCOUNTER — Inpatient Hospital Stay (HOSPITAL_COMMUNITY): Payer: Self-pay | Admitting: Physical Therapy

## 2017-08-16 ENCOUNTER — Inpatient Hospital Stay (HOSPITAL_COMMUNITY): Payer: Self-pay

## 2017-08-16 ENCOUNTER — Encounter (HOSPITAL_COMMUNITY): Payer: Self-pay

## 2017-08-16 ENCOUNTER — Inpatient Hospital Stay (HOSPITAL_COMMUNITY): Payer: Medicaid Other | Admitting: Occupational Therapy

## 2017-08-16 LAB — COMPREHENSIVE METABOLIC PANEL
ALT: 30 U/L (ref 17–63)
AST: 26 U/L (ref 15–41)
Albumin: 2 g/dL — ABNORMAL LOW (ref 3.5–5.0)
Alkaline Phosphatase: 203 U/L — ABNORMAL HIGH (ref 38–126)
Anion gap: 11 (ref 5–15)
BUN: 9 mg/dL (ref 6–20)
CO2: 28 mmol/L (ref 22–32)
Calcium: 8.3 mg/dL — ABNORMAL LOW (ref 8.9–10.3)
Chloride: 90 mmol/L — ABNORMAL LOW (ref 101–111)
Creatinine, Ser: 0.74 mg/dL (ref 0.61–1.24)
GFR calc Af Amer: >60 mL/min (ref >60)
GFR calc non Af Amer: >60 mL/min (ref >60)
Glucose, Bld: 174 mg/dL — ABNORMAL HIGH (ref 65–99)
Potassium: 4.1 mmol/L (ref 3.5–5.1)
Sodium: 129 mmol/L — ABNORMAL LOW (ref 135–145)
Total Bilirubin: 1.1 mg/dL (ref 0.3–1.2)
Total Protein: 7.9 g/dL (ref 6.5–8.1)

## 2017-08-16 LAB — GLUCOSE, CAPILLARY
GLUCOSE-CAPILLARY: 159 mg/dL — AB (ref 65–99)
GLUCOSE-CAPILLARY: 159 mg/dL — AB (ref 65–99)
GLUCOSE-CAPILLARY: 161 mg/dL — AB (ref 65–99)
Glucose-Capillary: 196 mg/dL — ABNORMAL HIGH (ref 65–99)

## 2017-08-16 LAB — DRUG PROFILE, UR, 9 DRUGS (LABCORP)
AMPHETAMINES, URINE: NEGATIVE ng/mL
BARBITURATE, UR: NEGATIVE ng/mL
BENZODIAZEPINE QUANT UR: NEGATIVE ng/mL
CANNABINOID QUANT UR: NEGATIVE ng/mL
COCAINE (METAB.): NEGATIVE ng/mL
Methadone Screen, Urine: NEGATIVE ng/mL
OPIATE QUANT UR: NEGATIVE ng/mL
PROPOXYPHENE, URINE: NEGATIVE ng/mL
Phencyclidine, Ur: NEGATIVE ng/mL

## 2017-08-16 LAB — CBC
HCT: 25.6 % — ABNORMAL LOW (ref 39.0–52.0)
Hemoglobin: 8.2 g/dL — ABNORMAL LOW (ref 13.0–17.0)
MCH: 28.8 pg (ref 26.0–34.0)
MCHC: 32 g/dL (ref 30.0–36.0)
MCV: 89.8 fL (ref 78.0–100.0)
Platelets: 284 10*3/uL (ref 150–400)
RBC: 2.85 MIL/uL — ABNORMAL LOW (ref 4.22–5.81)
RDW: 14.9 % (ref 11.5–15.5)
WBC: 8 10*3/uL (ref 4.0–10.5)

## 2017-08-16 LAB — IRON AND TIBC
IRON: 23 ug/dL — AB (ref 45–182)
Saturation Ratios: 11 % — ABNORMAL LOW (ref 17.9–39.5)
TIBC: 204 ug/dL — ABNORMAL LOW (ref 250–450)
UIBC: 181 ug/dL

## 2017-08-16 LAB — TYPE AND SCREEN
ABO/RH(D): O POS
Antibody Screen: NEGATIVE

## 2017-08-16 LAB — ABO/RH: ABO/RH(D): O POS

## 2017-08-16 LAB — OCCULT BLOOD X 1 CARD TO LAB, STOOL: Fecal Occult Bld: NEGATIVE

## 2017-08-16 MED ORDER — TRAZODONE HCL 50 MG PO TABS
50.0000 mg | ORAL_TABLET | Freq: Every evening | ORAL | Status: DC | PRN
  Administered 2017-08-16 – 2017-08-17 (×2): 50 mg via ORAL
  Filled 2017-08-16 (×2): qty 1

## 2017-08-16 NOTE — Progress Notes (Signed)
Physical Therapy Session Note  Patient Details  Name: Reginald [name] MRN: [id number] Date of Birth: [birth year]  Today's Date: 08/16/2017 PT Individual Time: 1435-1530 PT Individual Time Calculation (min): 55 min   Short Term Goals: Week 1:  PT Short Term Goal 1 (Week 1): =LTGs due to ELOS  Skilled Therapeutic Interventions/Progress Updates: Pt presented in bed asleep but easily awoken. Pt required increased encouragement to participate in therapy. Performed bed mobility with supervision and use of bed rail. Performed stand pivot transfer no AD with increased time. Pt propelled to nsg station supervision and able to correct direction to maintain straight path. Pt transported remaining distance to rehab gym. Performed standing tolerance activities including peg board and horse shoes while on 2 L supplemental O2. Pt able to maintain SpO2 above 90% throughout session. Continued pt edu on diaphragmatic breathing and decreased use of accessory ms at times when becoming increasingly anxious. Participated in UBE 2 min x 2 with fair tolerance. Pt required rest breaks due to fatigue as vitals remained stable throughout session. Pt returned to room and remained in w/c with needs met.      Therapy Documentation Precautions:  Precautions Precautions: Fall Precaution Comments: debility, watch O2 sats Restrictions Weight Bearing Restrictions: No General:   Vital Signs: Therapy Vitals Temp: 97.8 F (36.6 C) Temp Source: Oral Pulse Rate: 82 Resp: 16 BP: 106/61 Patient Position (if appropriate): Sitting Oxygen Therapy SpO2: 97 % O2 Device: Nasal Cannula O2 Flow Rate (L/min): 3 L/min Pain: Pain Assessment Pain Assessment: No/denies pain   See Function Navigator for Current Functional Status.   Therapy/Group: Individual Therapy  Marrisa Kimber  Gurney Balthazor, PTA  08/16/2017, 4:17 PM

## 2017-08-16 NOTE — Progress Notes (Signed)
[place] PHYSICAL MEDICINE & REHABILITATION     PROGRESS NOTE  Subjective/Complaints:   Problems with sleep at night, drank ETOH at home but denies using "sleeping pills" ROS: Denies CP, SOB, nausea, vomiting, diarrhea.  Objective: Vital Signs: Blood pressure 127/66, pulse 95, temperature 97.8 F (36.6 C), temperature source Oral, resp. rate 18, height _0  (1.753 m), weight 109.3 kg (240 lb 15.4 oz), SpO2 91 %. Dg Chest 2 View  Result Date: 08/16/2017 CLINICAL DATA:  Shortness of Breath EXAM: CHEST  2 VIEW COMPARISON:  August 08, 2017 FINDINGS: There is a left pleural effusion. There is diffuse interstitial and patchy alveolar edema bilaterally. There is mild cardiac enlargement with pulmonary venous hypertension. There is aortic atherosclerosis. No evident adenopathy. There is degenerative change in the thoracic spine. IMPRESSION: Interstitial and patchy alveolar edema. Small left pleural effusion. Heart mildly enlarged with mild pulmonary venous hypertension. Suspect digestive heart failure as most likely etiology. There is aortic atherosclerosis. Aortic Atherosclerosis (ICD10-I70.0). Electronically Signed   By: Lowella Grip III M.D.   On: 08/16/2017 08:45   Recent Labs    08/14/17 1245 08/16/17 0602  WBC 8.0 8.0  HGB 7.9* 8.2*  HCT 24.5* 25.6*  PLT 271 284   Recent Labs    08/14/17 0525 08/16/17 0602  NA 132* 129*  K 4.4 4.1  CL 93* 90*  GLUCOSE 175* 174*  BUN 10 9  CREATININE 0.73 0.74  CALCIUM 8.8* 8.3*   CBG (last 3)  Recent Labs    08/15/17 1649 08/15/17 2146 08/16/17 0629  GLUCAP 169* 118* 159*    Wt Readings from Last 3 Encounters:  08/16/17 109.3 kg (240 lb 15.4 oz)  08/10/17 107.7 kg (237 lb 6.4 oz)    Physical Exam:  BP 127/66 (BP Location: Left Arm)   Pulse 95   Temp 97.8 F (36.6 C) (Oral)   Resp 18   Ht _1  (1.753 m)   Wt 109.3 kg (240 lb 15.4 oz)   SpO2 91%   BMI 35.58 kg/m  Constitutional:  NAD. Obese.  HENT: Normocephalic.   atraumatic Cardiovascular: RRR.  No JVD. Respiratory: Effort normal and breath sounds normal. +[place] GI: Bowel sounds are normal. He exhibits no distension.  Musculoskeletal: He exhibits noedema. TTP left dorsum foot base of L 5th metatarsal Neurological: He isalertand oriented. He was able to follow simple motor commands without difficulty.  Motor: Bilateral upper extremities: 4/5 proximal to distal  Bilateral lower extremities: 4/5 proximal to distal Psych: Flat Skin. Warm and dry, no erythema over the left foot  Assessment/Plan: 1. Functional deficits secondary to septic emboli to brain, spine, and muscles which require 3+ hours per day of interdisciplinary therapy in a comprehensive inpatient rehab setting. Physiatrist is providing close team supervision and 24 hour management of active medical problems listed below. Physiatrist and rehab team continue to assess barriers to discharge/monitor patient progress toward functional and medical goals.  Function:  Bathing Bathing position      Bathing parts Body parts bathed by patient: Right arm, Left arm, Chest, Abdomen, Front perineal area, Right upper leg, Left upper leg, Right lower leg, Left lower leg Body parts bathed by helper: Back, Buttocks  Bathing assist Assist Level: Touching or steadying assistance(Pt > 75%)      Upper Body Dressing/Undressing Upper body dressing   What is the patient wearing?: Hospital gown(IV)                Upper body assist  Lower Body Dressing/Undressing Lower body dressing   What is the patient wearing?: Pants, Non-skid slipper socks     Pants- Performed by patient: Thread/unthread right pants leg, Thread/unthread left pants leg Pants- Performed by helper: Pull pants up/down(d/t current NWB precutions on L foot limiting balance) Non-skid slipper socks- Performed by patient: Don/doff right sock, Don/doff left sock                    Lower body assist Assist for lower  body dressing: Touching or steadying assistance (Pt > 75%)      Toileting Toileting Toileting activity did not occur: N/A Toileting steps completed by patient: Performs perineal hygiene Toileting steps completed by helper: Adjust clothing prior to toileting, Adjust clothing after toileting    Toileting assist Assist level: Touching or steadying assistance (Pt.75%)   Transfers Chair/bed transfer Chair/bed transfer activity did not occur: N/A Chair/bed transfer method: Ambulatory Chair/bed transfer assist level: Touching or steadying assistance (Pt > 75%) Chair/bed transfer assistive device: Armrests, Medical sales representative Ambulation activity did not occur: N/A   Max distance: 80 ft Assist level: Touching or steadying assistance (Pt > 75%)   Wheelchair          Cognition Comprehension Comprehension assist level: Follows complex conversation/direction with no assist  Expression Expression assist level: Expresses complex ideas: With no assist  Social Interaction Social Interaction assist level: Interacts appropriately 90% of the time - Needs monitoring or encouragement for participation or interaction.  Problem Solving Problem solving assist level: Solves basic 75 - 89% of the time/requires cueing 10 - 24% of the time  Memory Memory assist level: Recognizes or recalls 90% of the time/requires cueing < 10% of the time    Medical Problem List and Plan: 1.  Debility secondary to septic emboli to the brain, spine, and muscles.    Continue CIR PT, OT,SLP Team conference today please see physician documentation under team conference tab, met with team face-to-face to discuss problems,progress, and goals. Formulized individual treatment plan based on medical history, underlying problem and comorbidities.2.  DVT Prophylaxis/Anticoagulation: SCDs. Monitor for any signs of DVT 3. Pain Management/chronic pain syndrome: Lidoderm patch, Tylenol as needed    4. Mood: Provide  emotional support 5. Neuropsych: This patient is capable of making decisions on his own behalf. 6. Skin/Wound Care: Routine skin checks 7. Fluids/Electrolytes/Nutrition: Routine I&O's 8. 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 Intravenous Teflaro 600 mg every 12 hours, daptomycin 1000 mg daily 6 weeks total. Confirmed duration with infectious disease 9. Mitral regurgitation. Plan mitral valve replacement after conditioning and rehabilitation program completed per Dr. Lucianne Lei Tright 10. Multiple dental extractions 08/07/2017 11. Diabetes mellitus peripheral neuropathy.    Lantus insulin , increased to 12 Units on 12/24.    Check blood sugars before meals and at bedtime. Provide diabetic teaching    CBG (last 3)  Recent Labs    08/15/17 1649 08/15/17 2146 08/16/17 0629  GLUCAP 169* 118* 159*  Controlled 12/25 12. Diastolic congestive heart failure. Lasix 40 mg every 8 hours, decreased to daily due to hyponatremia on 12/23.   Monitor for any signs of fluid overload Filed Weights   08/14/17 0322 08/15/17 0150 08/16/17 0129  Weight: 108.7 kg (239 lb 10.2 oz) 108.5 kg (239 lb 3.2 oz) 109.3 kg (240 lb 15.4 oz)  13. COPD/tobacco abuse. Counseling  14. Alcohol abuse. Monitor for any signs of withdrawal 15. Hypertension. Lopressor 25 mg twice a day  Controlled on 12/25 Vitals:   08/15/17 2156 08/16/17 0129  BP: (!) 104/48 127/66  Pulse: 90 95  Resp:  18  Temp:  97.8 F (36.6 C)  SpO2:  91%   16. MRSA PCR screening positive. Contact precautions 17. Hyponatremia   Sodium 127 on 12/23, improved to 132 12/24 Lasix decreased to daily      Continue to monitor 18. ABLA- oral surgery   Hemoglobin 8.2 on 12/21, 7.9 on 12/24, 8.2 on 12/26   check stool guaic and Fe studies, will likely improve slowly   Cont to monitor 19.  Lethargy-improved   UDS neg, resolved after med changes     LOS (Days) 6 A FACE TO FACE EVALUATION WAS PERFORMED  Charlett Blake 08/16/2017 8:59 AM

## 2017-08-16 NOTE — Progress Notes (Signed)
Social Work Patient ID: Reginald [name], male   DOB: 1961/05/24, 56 y.o.   MRN: [id number]  Met with pt and girlfriend to discuss team conference goals-mod/i level and target discharge date 1/2. MD checking with surgeon regarding when will be scheduled prior to discharge versus home then surgery. He wants to go home to get some sleep since he is not sleeping here. Will work on discharge needs.

## 2017-08-16 NOTE — Progress Notes (Signed)
Refused 2nd set of blood cultures this AM.Reginald [name], Jodelle GreenJamie A

## 2017-08-16 NOTE — Patient Care Conference (Signed)
Inpatient RehabilitationTeam Conference and Plan of Care Update Date: 08/16/2017   Time: 10:40 AM    Patient Name: Reginald [name]      Medical Record Number: [id number]  Date of Birth: 03/04/1961 Sex: Male         Room/Bed: 4W24C/4W24C-01 Payor Info: Payor: MEDICAID POTENTIAL / Plan: MEDICAID POTENTIAL / Product Type: *No Product type* /    Admitting Diagnosis: Septic Emboli  Admit Date/Time:  08/10/2017  4:06 PM Admission Comments: No comment available   Primary Diagnosis:  Debility Principal Problem: Debility  Patient Active Problem List   Diagnosis Date Noted  . Lethargy   . Benign essential HTN   . Vascular headache   . Acute blood loss anemia   . Hyponatremia   . Essential hypertension   . Type 2 diabetes mellitus with peripheral neuropathy (HCC)   . Chronic pain syndrome   . Septic embolism (HCC) 08/10/2017  . Acute osteomyelitis of cervical spine (HCC) 08/10/2017  . 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 Debility 08/10/2017  . Hypertensive heart disease with heart failure (HCC)   . Endotracheal tube present   . Acute respiratory disease   . 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 Acute respiratory failure (HCC) 07/26/2017  . Alcohol abuse 07/26/2017  . Thrombocytopenia (HCC) 07/26/2017  . Abnormal liver enzymes 07/26/2017  . Acute encephalopathy 07/26/2017  . Acute diastolic CHF (congestive heart failure) (HCC) 07/26/2017  . Sepsis (HCC)   . Endocarditis of mitral valve   . Cerebral septic emboli (HCC)   . MRSA bacteremia 07/25/2017    Expected Discharge Date: Expected Discharge Date: 08/23/17  Team Members Present: Physician leading conference: Dr. Claudette LawsAndrew Kirsteins Social Worker Present: Dossie DerBecky Shmiel Morton, LCSW Nurse Present: Other (comment)(Lisa Derek JackFriese-RN) PT Present: Teodoro Kilaitlin Penven-Crew, PT OT Present: Roney MansJennifer Smith, OT SLP Present: Colin BentonMadison Cratch, SLP PPS Coordinator present : Tora DuckMarie Noel, RN, CRRN     Current Status/Progress Goal Weekly Team Focus   Medical   Patient with anxiety interfering with sleep, wants to go home, planning mitral valve replacement in early January  Maintain medical stability during rehabilitation stay  Improve sleep pattern   Bowel/Bladder   Continent bowel and bladder  Remain continent of B&B and regular BM's  Remain continent of B&B   Swallow/Nutrition/ Hydration   now with CAM boot and can be WBAT with boot, min A transfers and overall bathing and dressing sit to stands  bathing at supervision, dressing at mod I, toileting/ toilet transfers mod I   activity tolerance, standing balance, ADL retraining, sit to stands, d/c planning,    ADL's             Mobility   min guard/supervision   mod I, short distance ambulatory  balance, safety awareness, activity tolerance   Communication             Safety/Cognition/ Behavioral Observations  Mod A -Mod I, possible up and down changes due to lack of sleep   Mod I  selective, semi complex problem solving, recall    Pain   Continues to complain of pain to posterior neck, decrease pain to left foot.   pain < 5 on pain scale   need to add med to better manage pain.   Skin   intact   skin remain intact  monitor skin every shift.       *See Care Plan and progress notes for long and short-term goals.     Barriers to Discharge  Current Status/Progress Possible Resolutions Date Resolved  Physician    Inaccessible home environment;Medical stability;Home environment access/layout;Decreased caregiver support  Lives in a trailer 2 steps up  Progressing towards goals slowly, anxiety is limiting sleep  Medication management      Nursing  Medical stability;IV antibiotics;Pending surgery;New oxygen               PT                    OT                  SLP                SW                Discharge Planning/Teaching Needs:  Home with girlfriend and brother checking on him, needs to be mod/i to be safe home alone      Team Discussion:  Goals mod/i and  supervision with tub transfers. Question when surgery being done MD has left a message for surgeon. Started on trazodone for sleep, he has not been sleeping last three nights. Feels dizzy in therapies due to this. Foot in boot now due to fracture. Will need IV antibiotics and home O2 if goes home from rehab.  Revisions to Treatment Plan:  DC 1/2    Continued Need for Acute Rehabilitation Level of Care: The patient requires daily medical management by a physician with specialized training in physical medicine and rehabilitation for the following conditions: Daily direction of a multidisciplinary physical rehabilitation program to ensure safe treatment while eliciting the highest outcome that is of practical value to the patient.: Yes Daily medical management of patient stability for increased activity during participation in an intensive rehabilitation regime.: Yes Daily analysis of laboratory values and/or radiology reports with any subsequent need for medication adjustment of medical intervention for : Neurological problems;Mood/behavior problems;Cardiac problems  Willodean Leven, Lemar LivingsRebecca G 08/17/2017, 8:20 AM

## 2017-08-16 NOTE — Progress Notes (Signed)
Speech Language Pathology Daily Session Note  Patient Details  Name: Reginald [name] Thane MRN: [id number] Date of Birth: 09/26/1960  Today's Date: 08/16/2017 SLP Individual Time: 1003-1030 SLP Individual Time Calculation (min): 27 min  Short Term Goals: Week 1: SLP Short Term Goal 1 (Week 1): STG=LTG due to ELOS  Skilled Therapeutic Interventions: Skilled ST services focused on cognitive skills. Pt was in bed upon entering room and expressed he felt sleepy and had slept in several days. Pt was able to recall medication task with Mod A verbal cues, however Skilled ST was unable to locate medication list in room. Pt required Mod-Max A verbal cues to recall  Medication name/function without visual aid. Pt required min A verbal cues to sustain attention, seeming to fall asleep.SLP facilitated semi-complex problem solving utilizing daily math problems from ALFA, pt required Min-Mod A verbal cues. Pt stated he was going to try and sleep before the next therapy session. Pt was left in bed with call bell within reach. Recommend to continue skilled ST services.      Function:  Eating Eating                 Cognition Comprehension Comprehension assist level: Understands complex 90% of the time/cues 10% of the time  Expression   Expression assist level: Expresses complex ideas: With no assist  Social Interaction Social Interaction assist level: Interacts appropriately 90% of the time - Needs monitoring or encouragement for participation or interaction.  Problem Solving Problem solving assist level: Solves basic 75 - 89% of the time/requires cueing 10 - 24% of the time;Solves basic 50 - 74% of the time/requires cueing 25 - 49% of the time  Memory Memory assist level: Recognizes or recalls 75 - 89% of the time/requires cueing 10 - 24% of the time    Pain Pain Assessment Pain Assessment: No/denies pain  Therapy/Group: Individual Therapy  Hermilo Dutter  [facility] 08/16/2017, 3:15 PM

## 2017-08-16 NOTE — Plan of Care (Signed)
  Not Progressing RH SAFETY RH STG ADHERE TO SAFETY PRECAUTIONS W/ASSISTANCE/DEVICE Description STG Adhere to Safety Precautions With mod I Assistance/Device.  Attempts to get OOB without assistance. 08/16/2017 0155 - Not Progressing by Martina SinnerMurray, Sayyid Harewood A, RN RH PAIN MANAGEMENT RH STG PAIN MANAGED AT OR BELOW PT'S PAIN GOAL Description Pain  Less than 2  Consistently rates pain> 2 08/16/2017 0155 - Not Progressing by Martina SinnerMurray, Krishiv Sandler A, RN

## 2017-08-16 NOTE — Plan of Care (Signed)
  Progressing RH BLADDER ELIMINATION RH STG MANAGE BLADDER WITH ASSISTANCE Description STG Manage Bladder With  Mod IAssistance  08/16/2017 2351 - Progressing by Sissy HoffBarber, Almer Littleton M, RN RH SKIN INTEGRITY RH STG SKIN FREE OF INFECTION/BREAKDOWN Description No infection entire stay on rehab  08/16/2017 2351 - Progressing by Sissy HoffBarber, Emanie Behan M, RN RH STG MAINTAIN SKIN INTEGRITY WITH ASSISTANCE Description STG Maintain Skin Integrity With min Assistance.  08/16/2017 2351 - Progressing by Sissy HoffBarber, Hassie Mandt M, RN RH SAFETY RH STG ADHERE TO SAFETY PRECAUTIONS W/ASSISTANCE/DEVICE Description STG Adhere to Safety Precautions With mod I Assistance/Device.  08/16/2017 2351 - Progressing by Sissy HoffBarber, Cashius Grandstaff M, RN RH PAIN MANAGEMENT RH STG PAIN MANAGED AT OR BELOW PT'S PAIN GOAL Description Pain  Less than 2  08/16/2017 2351 - Progressing by Sissy HoffBarber, Temima Kutsch M, RN

## 2017-08-16 NOTE — Progress Notes (Signed)
Physical Therapy Session Note  Patient Details  Name: Reginald [name] MRN: [id number] Date of Birth: [birth year]  Today's Date: 08/16/2017 PT Individual Time: 1300-1400 PT Individual Time Calculation (min): 60 min   Short Term Goals: Week 1:  PT Short Term Goal 1 (Week 1): =LTGs due to ELOS  Skilled Therapeutic Interventions/Progress Updates:    no c/o pain but continues to report "just feeling poorly."  Education provided on pursed lip breathing and pacing throughout therapy sessions and throughout the day.  Session focus on activity tolerance.  O2 maintained >95% on 3L throughout session.   Pt transfers throughout session with RW and min guard, verbal cues for walker placement throughout transfers.  W/C propulsion to therapy gym (4 rest breaks between room and gym) with BUEs for activity tolerance and mobility.  Extended rest break following w/c propulsion.  Nustep at level 2 for activity tolerance focus on pacing and increasing activity tolerance.  Pt completes 3 trials on nustep to fatigue (1:45, 4:11,  6:53).  W/C propulsion x60' back towards room with supervision.    Pt left upright in w/c to await next therapy session, call bell in reach and needs met.   Therapy Documentation Precautions:  Precautions Precautions: Fall Precaution Comments: debility, watch O2 sats Restrictions Weight Bearing Restrictions: No   See Function Navigator for Current Functional Status.   Therapy/Group: Individual Therapy  Michel Santee 08/16/2017, 2:00 PM

## 2017-08-16 NOTE — Progress Notes (Signed)
Pharmacy Antibiotic Note  Reginald [name] is a 56 y.o. male admitted on 08/10/2017 with MRSA bacteremia and endocarditis.  Pharmacy was consulted on 07/31/17 for Daptomycin dosing. Rifampin d/c'd 12/18, as no prosthetic material requiring use. Repeat BCx 12/13 negative.   Afebrile > 48 hours, WBC WNL. SCr is WNL and stable. Weekly  CK 17 on 08/14/17.  Teeth extraction completed 12/17. Repeat Blood cultures sent today .   Plan: Continue daptomycin 1gm (~10mg /kg) IV Q24H F/u renal fxn, C&S, clinical status, LOT Weekly CK - due 12/24 F/u MV replacement plan  Height: 5\' 9"  (175.3 cm) Weight: 240 lb 15.4 oz (109.3 kg) IBW/kg (Calculated) : 70.7  Temp (24hrs), Avg:97.8 F (36.6 C), Min:97.8 F (36.6 C), Max:97.8 F (36.6 C)  Recent Labs  Lab 08/10/17 0413 08/11/17 0758 08/13/17 0742 08/14/17 0525 08/14/17 1245 08/16/17 0602  WBC 5.4 6.7  --   --  8.0 8.0  CREATININE 0.71 0.66 0.71 0.73  --  0.74    Estimated Creatinine Clearance: 124.1 mL/min (by C-G formula based on SCr of 0.74 mg/dL).    Allergies  Allergen Reactions  . Morphine And Related     Antimicrobials this admission: 12/4 [facility] >> 12/10 12/10 ceftaroline >> 12/21 12/12 rifampin>>12/18 12/10 daptomycin >>  Dose adjustments this admission: 12/7 1800 Vancomycin peak level: 42 12/8 0230 Vancomycin trough level: 38 on Vanc 1500mg  q12h, vanc held 12/9 0230 Vancomycin random level: 21 , dose decreased to 1750mg  q24h  [facility]: [facility]: MRSA (S: bactrim, vanc, dapto, linezolid, TCN) - per Dr. Feliz BeamSnider's note  12/4 BCx: MRSA - Vanc MIC < 0.5 12/5 HIV Ab: negative 12/6 MRSA PCR: positive 12/7 BCx: 2/2 SA 12/10 BCx: MRSA 12/13 BCx: Neg 12/26 BCx: sent   Noah Delaineuth Akhilesh Sassone, RPh Clinical Pharmacist Pager: [phone] [facility] #[id number] 08/16/2017 3:40 PM

## 2017-08-16 NOTE — Progress Notes (Signed)
Awake ALL night. Wanting to go home and get some sleep, then come back to hospital. PRN tylenol given at 2156, for complaint of posterior neck pain. PRN neb tx given at 2254, for complaint of feeling SOB. Frequently sitting on EOB. Rambling conversation at times. Congested and productive cough-tan/white sputum. O2 at 3L/M. BLE pitting edema. Left foot tender to touch, with mild edema and erythema. Alfredo MartinezMurray, Taliyah Watrous A

## 2017-08-16 NOTE — Progress Notes (Signed)
Occupational Therapy Session Note  Patient Details  Name: Reginald [name] Danis MRN: [id number] Date of Birth: 04/17/1961  Today's Date: 08/16/2017 OT Individual Time: [id number] OT Individual Time Calculation (min): 54 min    Short Term Goals: Week 1:  OT Short Term Goal 1 (Week 1): Pt will transfer to BSC/toilet wiht supervision OT Short Term Goal 2 (Week 1): Pt will complete posterior hygiene with CGA OT Short Term Goal 3 (Week 1): Pt will advance pants past hips wiht supervision OT Short Term Goal 4 (Week 1): Pt will wash buttocks wiht CGA for standing balance OT Short Term Goal 5 (Week 1): Pt will groom 2/2 items in standing to improve endurance  Skilled Therapeutic Interventions/Progress Updates:   Upon entering the room, pt seated in wheelchair awaiting therapist arrival. Pt agreeable to OT intervention and requesting to shower this session. Pt ambulating to bathroom with steady assist and use of RW . Pt seated on 3 in 1 in shower and requiring assistance to doff clothing for time management. Pt bathing self while seated and needing assistance to wash buttocks and back this session. Pt required multiple rest breaks this session with verbal cues for pursed lip breathing techniques while on 2 L O2 via [place] throughout session. Pt donning pants from seated level on 3 in 1 and standing to pull pants over B hips with steady assistance for balance. OT assisted pt with donning of L LE CAM boot. Pt ambulating to wheelchair in same manner as above and seated at sink for grooming tasks with increased time and set up A to obtain needed items.     Therapy Documentation Precautions:  Precautions Precautions: Fall Precaution Comments: debility, watch O2 sats Restrictions Weight Bearing Restrictions: No General:   Vital Signs:   Pain: Pain Assessment Pain Assessment: No/denies pain  See Function Navigator for Current Functional Status.   Therapy/Group: Individual Therapy  Alen BleacherBradsher, Kammy Klett  P 08/16/2017, 12:56 PM

## 2017-08-16 NOTE — Progress Notes (Signed)
Occupational Therapy Session Note  Patient Details  Name: Reginald [name] MRN: [id number] Date of Birth: 29-Mar-1961  Today's Date: 08/16/2017 OT Individual Time: 1045-1100 OT Individual Time Calculation (min): 15 min  and Today's Date: 08/16/2017 OT Group Time:  -       Short Term Goals: Week 1:  OT Short Term Goal 1 (Week 1): Pt will transfer to BSC/toilet wiht supervision OT Short Term Goal 2 (Week 1): Pt will complete posterior hygiene with CGA OT Short Term Goal 3 (Week 1): Pt will advance pants past hips wiht supervision OT Short Term Goal 4 (Week 1): Pt will wash buttocks wiht CGA for standing balance OT Short Term Goal 5 (Week 1): Pt will groom 2/2 items in standing to improve endurance  Skilled Therapeutic Interventions/Progress Updates:    tx session focused on ADL/self care training and transfer training.   Treatment make up session (21m included training on donning cam boot to L UE, pt requires max A for donning boot while seated EOB .  pt transfer from EOB sit to w/c with SPT with CGA and v/c for hand/foot placement. Pt completed brushing teeth in sitting with CGA and set up. Pt repositioned in chair with all needs met awaiting for next therapy.   Therapy Documentation Precautions:  Precautions Precautions: Fall Precaution Comments: debility, watch O2 sats Restrictions Weight Bearing Restrictions: No General:   Vital Signs: Therapy Vitals Pulse Rate: 92 BP: 135/77 Pain: Pain Assessment Pain Assessment: No/denies pain  See Function Navigator for Current Functional Status.   Therapy/Group: Individual Therapy  MDelon [place]12/26/2018, 12:08 PM

## 2017-08-17 ENCOUNTER — Inpatient Hospital Stay (HOSPITAL_COMMUNITY): Payer: Self-pay | Admitting: Physical Therapy

## 2017-08-17 ENCOUNTER — Inpatient Hospital Stay (HOSPITAL_COMMUNITY): Payer: Medicaid Other | Admitting: Speech Pathology

## 2017-08-17 ENCOUNTER — Inpatient Hospital Stay (HOSPITAL_COMMUNITY): Payer: Self-pay

## 2017-08-17 DIAGNOSIS — D508 Other iron deficiency anemias: Secondary | ICD-10-CM

## 2017-08-17 LAB — GLUCOSE, CAPILLARY
GLUCOSE-CAPILLARY: 198 mg/dL — AB (ref 65–99)
GLUCOSE-CAPILLARY: 196 mg/dL — AB (ref 65–99)
GLUCOSE-CAPILLARY: 222 mg/dL — AB (ref 65–99)
Glucose-Capillary: 183 mg/dL — ABNORMAL HIGH (ref 65–99)

## 2017-08-17 MED ORDER — INSULIN GLARGINE 100 UNIT/ML ~~LOC~~ SOLN
14.0000 [IU] | Freq: Every day | SUBCUTANEOUS | Status: DC
  Administered 2017-08-18 – 2017-08-19 (×2): 14 [IU] via SUBCUTANEOUS
  Filled 2017-08-17 (×3): qty 0.14

## 2017-08-17 MED ORDER — FE FUMARATE-B12-VIT C-FA-IFC PO CAPS
1.0000 | ORAL_CAPSULE | Freq: Three times a day (TID) | ORAL | Status: DC
  Administered 2017-08-17 – 2017-08-19 (×8): 1 via ORAL
  Filled 2017-08-17 (×9): qty 1

## 2017-08-17 NOTE — Progress Notes (Signed)
[facility]     PROGRESS NOTE  Subjective/Complaints:   No new issues overnite, pt c/o poor sleep despite trazodone, he stated dilaudid works fro him but discussed that is a pain med not for sleep  ROS: Denies CP, SOB, nausea, vomiting, diarrhea.  Objective: Vital Signs: Blood pressure 126/69, pulse (!) 103, temperature 98.8 F (37.1 C), temperature source Oral, resp. rate 18, height 5\' 9"  (1.753 m), weight 109 kg (240 lb 4.8 oz), SpO2 95 %. Dg Chest 2 View  Result Date: 08/16/2017 CLINICAL DATA:  Shortness of Breath EXAM: CHEST  2 VIEW COMPARISON:  August 08, 2017 FINDINGS: There is a left pleural effusion. There is diffuse interstitial and patchy alveolar edema bilaterally. There is mild cardiac enlargement with pulmonary venous hypertension. There is aortic atherosclerosis. No evident adenopathy. There is degenerative change in the thoracic spine. IMPRESSION: Interstitial and patchy alveolar edema. Small left pleural effusion. Heart mildly enlarged with mild pulmonary venous hypertension. Suspect digestive heart failure as most likely etiology. There is aortic atherosclerosis. Aortic Atherosclerosis (ICD10-I70.0). Electronically Signed   By: Bretta BangWilliam  Woodruff III M.D.   On: 08/16/2017 08:45   Recent Labs    08/14/17 1245 08/16/17 0602  WBC 8.0 8.0  HGB 7.9* 8.2*  HCT 24.5* 25.6*  PLT 271 284   Recent Labs    08/16/17 0602  NA 129*  K 4.1  CL 90*  GLUCOSE 174*  BUN 9  CREATININE 0.74  CALCIUM 8.3*   CBG (last 3)  Recent Labs    08/16/17 1638 08/16/17 2131 08/17/17 0650  GLUCAP 196* 161* 198*    Wt Readings from Last 3 Encounters:  08/17/17 109 kg (240 lb 4.8 oz)  08/10/17 107.7 kg (237 lb 6.4 oz)    Physical Exam:  BP 126/69 (BP Location: Left Arm)   Pulse (!) 103   Temp 98.8 F (37.1 C) (Oral)   Resp 18   Ht 5\' 9"  (1.753 m)   Wt 109 kg (240 lb 4.8 oz)   SpO2 95%   BMI 35.49 kg/m  Constitutional:  NAD. Obese.  HENT:  Normocephalic.  atraumatic Cardiovascular: RRR.  No JVD. Respiratory: Effort normal and breath sounds normal. +[facility]: Bowel sounds are normal. He exhibits no distension.  Musculoskeletal: He exhibits noedema. TTP left dorsum foot base of L 5th metatarsal Neurological: He isalertand oriented. He was able to follow simple motor commands without difficulty.  Motor: Bilateral upper extremities: 4/5 proximal to distal  Bilateral lower extremities: 4/5 proximal to distal Psych: Flat Skin. Warm and dry, no erythema over the left foot  Assessment/Plan: 1. Functional deficits secondary to septic emboli to brain, spine, and muscles which require 3+ hours per day of interdisciplinary therapy in a comprehensive inpatient rehab setting. Physiatrist is providing close team supervision and 24 hour management of active medical problems listed below. Physiatrist and rehab team continue to assess barriers to discharge/monitor patient progress toward functional and medical goals.  Function:  Bathing Bathing position   Position: Shower  Bathing parts Body parts bathed by patient: Right arm, Left arm, Chest, Abdomen, Front perineal area, Right upper leg, Left upper leg, Right lower leg, Left lower leg Body parts bathed by helper: Back, Buttocks  Bathing assist Assist Level: Touching or steadying assistance(Pt > 75%)      Upper Body Dressing/Undressing Upper body dressing Upper body dressing/undressing activity did not occur: Safety/medical concerns What is the patient wearing?: Pull over shirt/dress     Pull over shirt/dress -  Perfomed by patient: Thread/unthread right sleeve, Thread/unthread left sleeve, Put head through opening, Pull shirt over trunk          Upper body assist Assist Level: Set up   Set up : To obtain clothing/put away  Lower Body Dressing/Undressing Lower body dressing   What is the patient wearing?: Pants, Non-skid slipper socks     Pants- Performed by patient:  Thread/unthread right pants leg, Thread/unthread left pants leg, Pull pants up/down Pants- Performed by helper: Pull pants up/down(d/t current NWB precutions on L foot limiting balance) Non-skid slipper socks- Performed by patient: Don/doff right sock, Don/doff left sock                    Lower body assist Assist for lower body dressing: Touching or steadying assistance (Pt > 75%)      Toileting Toileting Toileting activity did not occur: N/A Toileting steps completed by patient: Performs perineal hygiene Toileting steps completed by helper: Adjust clothing prior to toileting, Adjust clothing after toileting    Toileting assist Assist level: Touching or steadying assistance (Pt.75%)   Transfers Chair/bed transfer Chair/bed transfer activity did not occur: N/A Chair/bed transfer method: Ambulatory Chair/bed transfer assist level: Touching or steadying assistance (Pt > 75%) Chair/bed transfer assistive device: Armrests, Patent attorneyWalker     Locomotion Ambulation Ambulation activity did not occur: N/A   Max distance: 10' Assist level: Touching or steadying assistance (Pt > 75%)   Wheelchair       Assist Level: Dependent (Pt equals 0%)  Cognition Comprehension Comprehension assist level: Follows complex conversation/direction with no assist  Expression Expression assist level: Expresses complex ideas: With no assist  Social Interaction Social Interaction assist level: Interacts appropriately 90% of the time - Needs monitoring or encouragement for participation or interaction.  Problem Solving Problem solving assist level: Solves basic 75 - 89% of the time/requires cueing 10 - 24% of the time  Memory Memory assist level: Recognizes or recalls 90% of the time/requires cueing < 10% of the time    Medical Problem List and Plan: 1.  Debility secondary to septic emboli to the brain, spine, and muscles.    Continue CIR PT, OT,SLP 2.  DVT Prophylaxis/Anticoagulation: SCDs. Monitor for any  signs of DVT 3. Pain Management/chronic pain syndrome: Lidoderm patch, Tylenol as needed    4. Mood: Provide emotional support 5. Neuropsych: This patient is capable of making decisions on his own behalf. 6. Skin/Wound Care: Routine skin checks 7. Fluids/Electrolytes/Nutrition: Routine I&O's 8. 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 Intravenous Teflaro 600 mg every 12 hours, daptomycin 1000 mg daily 6 weeks total. Confirmed duration with infectious disease 9. Mitral regurgitation. Plan mitral valve replacement after conditioning and rehabilitation program completed per Dr. Zenaida NieceVan Tright 10. Multiple dental extractions 08/07/2017 11. Diabetes mellitus peripheral neuropathy.    Lantus insulin , increased to 14 Units on 12/27.    Check blood sugars before meals and at bedtime. Provide diabetic teaching    CBG (last 3)  Recent Labs    08/16/17 1638 08/16/17 2131 08/17/17 0650  GLUCAP 196* 161* 198*  elevated fasting 12/27 12. Diastolic congestive heart failure. Lasix 40 mg every 8 hours, decreased to daily due to hyponatremia on 12/23.   Monitor for any signs of fluid overload Filed Weights   08/15/17 0150 08/16/17 0129 08/17/17 0350  Weight: 108.5 kg (239 lb 3.2 oz) 109.3 kg (240 lb 15.4 oz) 109 kg (240 lb 4.8 oz)  13. COPD/tobacco abuse.  Counseling  14. Alcohol abuse. Monitor for any signs of withdrawal 15. Hypertension. Lopressor 25 mg twice a day   Controlled on 12/27 Vitals:   08/16/17 2027 08/17/17 0350  BP: 110/70 126/69  Pulse: 84 (!) 103  Resp:  18  Temp:  98.8 F (37.1 C)  SpO2:  95%   16. MRSA PCR screening positive. Contact precautions 17. Hyponatremia   Sodium 127 on 12/23, improved to 132 12/24 Lasix decreased to daily      Continue to monitor 18. ABLA- oral surgery   Hemoglobin 8.2 on 12/21, 7.9 on 12/24, 8.2 on 12/26   negative stool guaic and Low serum Fe and TIBC Fe studies, will supplement   Cont to monitor 19.   Lethargy-improved   UDS neg, resolved after med changes     LOS (Days) 7 A FACE TO FACE EVALUATION WAS PERFORMED  Erick Colace 08/17/2017 8:59 AM

## 2017-08-17 NOTE — Progress Notes (Signed)
Physical Therapy Session Note  Patient Details  Name: Reginald [name] MRN: [id number] Date of Birth: Nov 14, 1960  Today's Date: 08/17/2017 PT Individual Time: 1530-1610 PT Individual Time Calculation (min): 40 min (10 min make up)  Short Term Goals: Week 1:  PT Short Term Goal 1 (Week 1): =LTGs due to ELOS  Skilled Therapeutic Interventions/Progress Updates:    no c/o pain.  Session focus on activity tolerance and gait with RW.    Pt transferred to w/c with supervision.  Kinetron from w/c position at 40 cm/s 5 trials of 1 minute with 1 min rest in between.  Gait x60' with RW and min guard, verbal cues for pursed lip breathing with pt unable to ambulate and demo breathing technique at the same time. Gait x30' with supervision and RW, again with cues for breathing.  Pt returned to room in w/c at end of session, positioned upright with call bell in reach and needs met.   Therapy Documentation Precautions:  Precautions Precautions: Fall Precaution Comments: debility, watch O2 sats Restrictions Weight Bearing Restrictions: No General: PT Amount of Missed Time (min): 23 Minutes PT Missed Treatment Reason: Other (Comment)   See Function Navigator for Current Functional Status.   Therapy/Group: Individual Therapy  Michel Santee 08/17/2017, 4:09 PM

## 2017-08-17 NOTE — Discharge Instructions (Signed)
Inpatient Rehab Discharge Instructions  [facility] Dis[facility] date and time: No discharge date for patient encounter.   Activities/Precautions/ Functional Status: Activity: activity as tolerated Diet: soft Wound Care: none needed Functional status:  ___ No restrictions     ___ Walk up steps independently ___ 24/7 supervision/assistance   ___ Walk up steps with assistance ___ Intermittent supervision/assistance  ___ Bathe/dress independently ___ Walk with walker     _x__ Bathe/dress with assistance ___ Walk Independently    ___ Shower independently ___ Walk with assistance    ___ Shower with assistance ___ No alcohol     ___ Return to work/school ________  Special Instructions: Follow-up MRI cervical spine for evaluation of vertebral osteomyelitis prior to mitral valve replacement surgery   My questions have been answered and I understand these instructions. I will adhere to these goals and the provided educational materials after my discharge from the hospital.  Patient/Caregiver Signature _______________________________ Date __________  Clinician Signature _______________________________________ Date __________  Please bring this form and your medication list with you to all your follow-up doctor's appointments.

## 2017-08-17 NOTE — Progress Notes (Signed)
Occupational Therapy Session Note  Patient Details  Name: Reginald [name] MRN: [id number] Date of Birth: 12/16/1960  Today's Date: 08/17/2017 OT Individual Time: 1100-1157 OT Individual Time Calculation (min): 57 min    Short Term Goals: Week 1:  OT Short Term Goal 1 (Week 1): Pt will transfer to BSC/toilet wiht supervision OT Short Term Goal 2 (Week 1): Pt will complete posterior hygiene with CGA OT Short Term Goal 3 (Week 1): Pt will advance pants past hips wiht supervision OT Short Term Goal 4 (Week 1): Pt will wash buttocks wiht CGA for standing balance OT Short Term Goal 5 (Week 1): Pt will groom 2/2 items in standing to improve endurance  Skilled Therapeutic Interventions/Progress Updates:    OT intervention with focus on BADL retraining and functional amb with RW.  Pt engaged in bathing at shower level and dressing with sit<>stand from w/c.  Wife assisted with all tasks.  Pt amb with RW to bathroom for shower.  Pt's wife assisted with buttocks.  Pt required more than a reasonable amount of time with multiple rest breaks. During session.  Pt's wife assisted with donning socks.  Pt remained in w/c with all needs within reach.   Therapy Documentation Precautions:  Precautions Precautions: Fall Precaution Comments: debility, watch O2 sats Restrictions Weight Bearing Restrictions: No  Pain:  Pt denies pain    See Function Navigator for Current Functional Status.   Therapy/Group: Individual Therapy  Rich BraveLanier, Erikson Danzy Chappell 08/17/2017, 11:59 AM

## 2017-08-17 NOTE — Progress Notes (Addendum)
Social Work Patient ID: Reginald [name], male   DOB: 11/22/1960, 56 y.o.   MRN: [id number]  Attempted to begin arranging IV antibiotics and home O2. Pt is uninsured and will have difficulty getting home health and IV antibiotics at home.Spoken with [facility] they can provide antibiotics but not the nursing for it. [facility] and they will not take charity care. Will let MD know about this Jesusita Okaan -PA aware and will pass along. Pt and girlfriend aware of all of this.

## 2017-08-17 NOTE — Progress Notes (Signed)
Physical Therapy Session Note  Patient Details  Name: Reginald [name] MRN: [id number] Date of Birth: [birth year]  Today's Date: 08/17/2017 PT Individual Time: 1062-6948 PT Individual Time Calculation (min): 52 min   Short Term Goals: Week 1:  PT Short Term Goal 1 (Week 1): =LTGs due to ELOS  Skilled Therapeutic Interventions/Progress Updates:    PT arriving late this session.  No c/o pain, but pt visibly upset upon entering room.  States that he had needed some time to compose himself and that he was informed today that the campground where his camper is parked is shutting down for the winter and he may not be able to return.  Also upset that he's been diagnosed with COPD.  PT provided emotional support and encouragement.    Pt taken to therapy gym total assist for energy conservation.  Stair negotiation x4 steps with 2 rails and steady assist with verbal cues for sequencing.  O2 on completion 82% on 1L via [place].  Returned to 97% on 1L within 1 minute of sitting.    PT instructed pt in 2x10 LE therex with 3# ankle weights LAQ, hip flexion, ball squeezes, and hip abduction with level 3 theraband, and 2x10 UE therex with 3# weights for bicep curls and tricep extension.  Pt declines continuing therapy at this time, despite encouragement from PT to make up missed time.  Returned to room and positioned in w/c with call bell in reach and needs met.  Will attempt to make up some missed time in session later this PM.   Therapy Documentation Precautions:  Precautions Precautions: Fall Precaution Comments: debility, watch O2 sats Restrictions Weight Bearing Restrictions: No General: PT Amount of Missed Time (min): 23 Minutes PT Missed Treatment Reason: Other (Comment)   See Function Navigator for Current Functional Status.   Therapy/Group: Individual Therapy  Michel Santee 08/17/2017, 3:26 PM

## 2017-08-17 NOTE — Progress Notes (Signed)
Speech Language Pathology Daily Session Note  Patient Details  Name: Reginald [name] MRN: [id number] Date of Birth: 12/07/1960  Today's Date: 08/17/2017 SLP Individual Time: 1000-1025 SLP Individual Time Calculation (min): 25 min  Short Term Goals: Week 1: SLP Short Term Goal 1 (Week 1): STG=LTG due to ELOS  Skilled Therapeutic Interventions: Skilled treatment session focused on cognitive goals. SLP facilitated session by providing Mod-Max A verbal cues for organization and problem solving during a basic-mildly complex money management task. Patient demonstrated increased emergent awareness in regards to his impaired cognitive function and difficulty of task and verbalized, "I didn't realize my thinking was impacted this much by this." Patient left upright in bed with all needs within reach and alarm on. Continue with current plan of care.      Function:   Cognition Comprehension Comprehension assist level: Understands complex 90% of the time/cues 10% of the time  Expression   Expression assist level: Expresses complex 90% of the time/cues < 10% of the time  Social Interaction Social Interaction assist level: Interacts appropriately 90% of the time - Needs monitoring or encouragement for participation or interaction.  Problem Solving Problem solving assist level: Solves basic 75 - 89% of the time/requires cueing 10 - 24% of the time  Memory Memory assist level: Recognizes or recalls 75 - 89% of the time/requires cueing 10 - 24% of the time    Pain No/Denies Pain   Therapy/Group: Individual Therapy  Yosselyn Tax 08/17/2017, 2:27 PM

## 2017-08-18 ENCOUNTER — Inpatient Hospital Stay (HOSPITAL_COMMUNITY): Payer: Self-pay | Admitting: Physical Therapy

## 2017-08-18 ENCOUNTER — Inpatient Hospital Stay (HOSPITAL_COMMUNITY): Payer: Medicaid Other | Admitting: Speech Pathology

## 2017-08-18 ENCOUNTER — Inpatient Hospital Stay (HOSPITAL_COMMUNITY): Payer: Self-pay | Admitting: Occupational Therapy

## 2017-08-18 LAB — GLUCOSE, CAPILLARY
GLUCOSE-CAPILLARY: 218 mg/dL — AB (ref 65–99)
Glucose-Capillary: 181 mg/dL — ABNORMAL HIGH (ref 65–99)
Glucose-Capillary: 161 mg/dL — ABNORMAL HIGH (ref 65–99)
Glucose-Capillary: 186 mg/dL — ABNORMAL HIGH (ref 65–99)

## 2017-08-18 MED ORDER — ZOLPIDEM TARTRATE 5 MG PO TABS
5.0000 mg | ORAL_TABLET | Freq: Every evening | ORAL | Status: DC | PRN
  Administered 2017-08-18: 5 mg via ORAL
  Filled 2017-08-18: qty 1

## 2017-08-18 NOTE — Progress Notes (Signed)
Speech Language Pathology Weekly Progress and Session Note  Patient Details  Name: Reginald [name] MRN: [id number] Date of Birth: 09/30/1960  Beginning of progress report period: August 10, 2017 End of progress report period: August 18, 2017  Today's Date: 08/18/2017 SLP Individual Time: 3235-5732 SLP Individual Time Calculation (min): 55 min  Short Term Goals: Week 1: SLP Short Term Goal 1 (Week 1): STG=LTG due to ELOS 0 of 3 STGs met     New Short Term Goals: Week 2: SLP Short Term Goal 1 (Week 2): Patient will demonstrate functional problem solving for basic and familiar tasks with supervision verbal cues.  SLP Short Term Goal 2 (Week 2): Patient will demonstrate selective attention in a mildly distracting enviornment for 30 mintues with supervision verbal cues for redirection.  SLP Short Term Goal 3 (Week 2): Patient will recall new, functional information with supervision verbal cues.   Weekly Progress Updates: Patient has made minimal gains and has not met any STG's this reporting period. Currently, patient continues to require overall Min A verbal cues to perform functional and familiar tasks safely in regards to problem solving, attention and recall. Patient's deficits have also been excacerbated by fatigue and lethargy due to lack of sleep at night. Physician is aware and addressing. Patient and family education is ongoing. Patient would benefit from continued skilled SLP intervention to maximize his cognitive functioning and overall functional independence prior to discharge home.    Intensity: Minumum of 1-2 x/day, 30 to 90 minutes Frequency: 3 to 5 out of 7 days Duration/Length of Stay: 1/2  Treatment/Interventions: Cognitive remediation/compensation;Cueing hierarchy;Environmental controls;Internal/external aids;Functional tasks;Patient/family education;Therapeutic Activities   Daily Session  Skilled Therapeutic Interventions: Skilled treatment session focused on  cognitive goals. SLP facilitated session by providing Min A verbal cues for problem solving during a mildly complex money management task, however, patient was Mod I for problem solving with a basic money management task this session. Patient continues to report fatigue and lethargy throughout session due to poor sleep at night. Patient left upright in wheelchair with wife present. Continue with current plan of care.        Function:     Cognition Comprehension Comprehension assist level: Understands complex 90% of the time/cues 10% of the time  Expression   Expression assist level: Expresses complex 90% of the time/cues < 10% of the time  Social Interaction Social Interaction assist level: Interacts appropriately 90% of the time - Needs monitoring or encouragement for participation or interaction.  Problem Solving Problem solving assist level: Solves basic 75 - 89% of the time/requires cueing 10 - 24% of the time  Memory Memory assist level: Recognizes or recalls 75 - 89% of the time/requires cueing 10 - 24% of the time   Pain No/Denies Pain   Therapy/Group: Individual Therapy  Ankit Degregorio, [place] 08/18/2017, 3:41 PM

## 2017-08-18 NOTE — Progress Notes (Signed)
Physical Therapy Weekly Progress Note  Patient Details  Name: Reginald [name] MRN: [id number] Date of Birth: 1961-02-26  Beginning of progress report period: August 10, 2017 End of progress report period: August 18, 2017  Today's Date: 08/18/2017 PT Individual Time: 3496-1164 PT Individual Time Calculation (min): 70 min   Patient continues to make progress with PT and is currently performing all mobility with supervision.  Pt continues to be limited by cardiopulmonary endurance, SOB, and anxiety.    Patient continues to demonstrate the following deficits decreased cardiorespiratoy endurance and decreased oxygen support and therefore will continue to benefit from skilled PT intervention to increase functional independence with mobility.  Patient progressing toward long term goals..  Plan of care revisions: decreased distance for gait goal.  PT Short Term Goals Week 1:  PT Short Term Goal 1 (Week 1): =LTGs due to ELOS Week 2:  PT Short Term Goal 1 (Week 2): =LTGs  Skilled Therapeutic Interventions/Progress Updates:    no c/o pain, but again reports fatigue.  States he's not been sleeping well.  Session focus on activity tolerance and pt education.  Pt demos improved ability for pursed lip breathing throughout session with decreased need for cues from therapist.  O2 maintained >88% on room air throughout majority of session, including LE therex.  Pt placed on 0.5 L for gait and nustep and maintained >90%.  Pt completes 2x10 therex for BLEs hip flexion and LAQ with 3# weight on RLE and cam boot on LLE.  5x sit<>stand with supervision, verbal cues for pacing.  Gait x60' +50' with RW and supervision, min cues for turning to sit on surface.  Nustep 3x2 minutes at level 1 for activity tolerance with verbal cues for breathing and sufficient rest break.  Pt propelled w/c x50' with BUEs for activity tolerance and mobility.  Returned to room at end of session and positioned upright in w/c with call  bell in reach and needs met.   Therapy Documentation Precautions:  Precautions Precautions: Fall Precaution Comments: debility, watch O2 sats Restrictions Weight Bearing Restrictions: No   See Function Navigator for Current Functional Status.  Therapy/Group: Individual Therapy  Michel Santee 08/18/2017, 4:17 PM

## 2017-08-18 NOTE — Progress Notes (Signed)
[place] PHYSICAL MEDICINE & REHABILITATION     PROGRESS NOTE  Subjective/Complaints:   Sleep problems at noc despite trazodone,  Used CPAP in past but no longer has mask  ROS: Denies CP, SOB, nausea, vomiting, diarrhea.  Objective: Vital Signs: Blood pressure 135/69, pulse (!) 106, temperature 97.8 F (36.6 C), temperature source Oral, resp. rate 20, height 5\' 9"  (1.753 m), weight 109 kg (240 lb 4.8 oz), SpO2 92 %. Dg Chest 2 View  Result Date: 08/16/2017 CLINICAL DATA:  Shortness of Breath EXAM: CHEST  2 VIEW COMPARISON:  August 08, 2017 FINDINGS: There is a left pleural effusion. There is diffuse interstitial and patchy alveolar edema bilaterally. There is mild cardiac enlargement with pulmonary venous hypertension. There is aortic atherosclerosis. No evident adenopathy. There is degenerative change in the thoracic spine. IMPRESSION: Interstitial and patchy alveolar edema. Small left pleural effusion. Heart mildly enlarged with mild pulmonary venous hypertension. Suspect digestive heart failure as most likely etiology. There is aortic atherosclerosis. Aortic Atherosclerosis (ICD10-I70.0). Electronically Signed   By: Bretta BangWilliam  Woodruff III M.D.   On: 08/16/2017 08:45   Recent Labs    08/16/17 0602  WBC 8.0  HGB 8.2*  HCT 25.6*  PLT 284   Recent Labs    08/16/17 0602  NA 129*  K 4.1  CL 90*  GLUCOSE 174*  BUN 9  CREATININE 0.74  CALCIUM 8.3*   CBG (last 3)  Recent Labs    08/17/17 1631 08/17/17 2208 08/18/17 0653  GLUCAP 222* 183* 181*    Wt Readings from Last 3 Encounters:  08/17/17 109 kg (240 lb 4.8 oz)  08/10/17 107.7 kg (237 lb 6.4 oz)    Physical Exam:  BP 135/69 (BP Location: Left Arm)   Pulse (!) 106   Temp 97.8 F (36.6 C) (Oral)   Resp 20   Ht 5\' 9"  (1.753 m)   Wt 109 kg (240 lb 4.8 oz)   SpO2 92%   BMI 35.49 kg/m  Constitutional:  NAD. Obese.  HENT: Normocephalic.  atraumatic Cardiovascular: RRR.  No JVD. Respiratory: Effort normal and  breath sounds normal. +Boody GI: Bowel sounds are normal. He exhibits no distension.  Musculoskeletal: He exhibits noedema. TTP left dorsum foot base of L 5th metatarsal Neurological: He isalertand oriented. He was able to follow simple motor commands without difficulty.  Motor: Bilateral upper extremities: 4/5 proximal to distal  Bilateral lower extremities: 4/5 proximal to distal Psych: Flat Skin. Warm and dry, no erythema over the left foot  Assessment/Plan: 1. Functional deficits secondary to septic emboli to brain, spine, and muscles which require 3+ hours per day of interdisciplinary therapy in a comprehensive inpatient rehab setting. Physiatrist is providing close team supervision and 24 hour management of active medical problems listed below. Physiatrist and rehab team continue to assess barriers to discharge/monitor patient progress toward functional and medical goals.  Function:  Bathing Bathing position   Position: Shower  Bathing parts Body parts bathed by patient: Right arm, Left arm, Chest, Abdomen, Front perineal area, Right upper leg, Left upper leg, Right lower leg, Left lower leg Body parts bathed by helper: Back, Buttocks  Bathing assist Assist Level: Touching or steadying assistance(Pt > 75%)      Upper Body Dressing/Undressing Upper body dressing Upper body dressing/undressing activity did not occur: Safety/medical concerns What is the patient wearing?: Pull over shirt/dress     Pull over shirt/dress - Perfomed by patient: Thread/unthread right sleeve, Thread/unthread left sleeve, Put head through opening, Pull shirt  over trunk          Upper body assist Assist Level: Set up   Set up : To obtain clothing/put away  Lower Body Dressing/Undressing Lower body dressing   What is the patient wearing?: Pants, Non-skid slipper socks     Pants- Performed by patient: Thread/unthread right pants leg, Thread/unthread left pants leg, Pull pants up/down Pants-  Performed by helper: Pull pants up/down(d/t current NWB precutions on L foot limiting balance) Non-skid slipper socks- Performed by patient: Don/doff right sock, Don/doff left sock Non-skid slipper socks- Performed by helper: Don/doff right sock, Don/doff left sock                  Lower body assist Assist for lower body dressing: Touching or steadying assistance (Pt > 75%)      Toileting Toileting Toileting activity did not occur: (uses urinal) Toileting steps completed by patient: Performs perineal hygiene Toileting steps completed by helper: Adjust clothing prior to toileting, Adjust clothing after toileting    Toileting assist Assist level: Touching or steadying assistance (Pt.75%)   Transfers Chair/bed transfer Chair/bed transfer activity did not occur: N/A Chair/bed transfer method: Ambulatory Chair/bed transfer assist level: Touching or steadying assistance (Pt > 75%) Chair/bed transfer assistive device: Armrests, Patent attorney Ambulation activity did not occur: N/A   Max distance: 10' Assist level: Touching or steadying assistance (Pt > 75%)   Wheelchair       Assist Level: Dependent (Pt equals 0%)  Cognition Comprehension Comprehension assist level: Understands complex 90% of the time/cues 10% of the time  Expression Expression assist level: Expresses complex 90% of the time/cues < 10% of the time  Social Interaction Social Interaction assist level: Interacts appropriately 90% of the time - Needs monitoring or encouragement for participation or interaction.  Problem Solving Problem solving assist level: Solves basic 75 - 89% of the time/requires cueing 10 - 24% of the time  Memory Memory assist level: Recognizes or recalls 75 - 89% of the time/requires cueing 10 - 24% of the time    Medical Problem List and Plan: 1.  Debility secondary to septic emboli to the brain, spine, and muscles.    Continue CIR PT, OT,SLP 2.  DVT  Prophylaxis/Anticoagulation: SCDs. Monitor for any signs of DVT 3. Pain Management/chronic pain syndrome: Lidoderm patch, Tylenol as needed    4. Mood: Provide emotional support 5. Neuropsych: This patient is capable of making decisions on his own behalf. 6. Skin/Wound Care: Routine skin checks 7. Fluids/Electrolytes/Nutrition: Routine I&O's 8. 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 Intravenous Teflaro 600 mg every 12 hours, daptomycin 1000 mg daily 6 weeks total. Confirmed duration with infectious disease 9. Mitral regurgitation. Plan mitral valve replacement after conditioning and rehabilitation program completed per Dr. Zenaida Niece Tright 10. Multiple dental extractions 08/07/2017 11. Diabetes mellitus peripheral neuropathy.    Lantus insulin , increased to 14 Units on 12/27.    Check blood sugars before meals and at bedtime. Provide diabetic teaching    CBG (last 3)  Recent Labs    08/17/17 1631 08/17/17 2208 08/18/17 0653  GLUCAP 222* 183* 181*  elevated fasting 12/28 increase lantus to 16U 12. Diastolic congestive heart failure. Lasix 40 mg every 8 hours, decreased to daily due to hyponatremia on 12/23.   Monitor for any signs of fluid overload Filed Weights   08/15/17 0150 08/16/17 0129 08/17/17 0350  Weight: 108.5 kg (239 lb 3.2 oz) 109.3 kg (240 lb  15.4 oz) 109 kg (240 lb 4.8 oz)  13. COPD/tobacco abuse. Counseling  14. Alcohol abuse. Monitor for any signs of withdrawal 15. Hypertension. Lopressor 25 mg twice a day   Controlled on 12/28 Vitals:   08/17/17 2031 08/18/17 0300  BP: 112/67 135/69  Pulse: 78 (!) 106  Resp:  20  Temp: 98.9 F (37.2 C) 97.8 F (36.6 C)  SpO2: 99% 92%   16. MRSA PCR screening positive. Contact precautions 17. Hyponatremia- improved   Sodium 127 on 12/23, improved to 132 12/24 Lasix decreased to daily but will increase to BID due to CXR and peripheral edema      Continue to monitor 18. ABLA- oral surgery    Hemoglobin 8.2 on 12/21, 7.9 on 12/24, 8.2 on 12/26   negative stool guaic and Low serum Fe and TIBC Fe studies, will supplement   Cont to monitor 19.  Lethargy-improved   UDS neg, resolved after med changes 20.  Insomnia likely multifactorial , hx OSA used CPAP ~3784yrs ago, will check O2 oximetry at noc to document desat No improvement with trazodone will trial ambien 5mg , if no improvement rec increase to 10mg     LOS (Days) 8 A FACE TO FACE EVALUATION WAS PERFORMED  Erick Colacendrew E Pankaj Haack 08/18/2017 8:29 AM

## 2017-08-18 NOTE — Progress Notes (Signed)
Occupational Therapy Session Note  Patient Details  Name: Reginald [name] MRN: [id number] Date of Birth: 09/05/1960  Today's Date: 08/18/2017 OT Individual Time: 1100-1155 OT Individual Time Calculation (min): 55 min    Short Term Goals: Week 1:  OT Short Term Goal 1 (Week 1): Pt will transfer to BSC/toilet wiht supervision OT Short Term Goal 2 (Week 1): Pt will complete posterior hygiene with CGA OT Short Term Goal 3 (Week 1): Pt will advance pants past hips wiht supervision OT Short Term Goal 4 (Week 1): Pt will wash buttocks wiht CGA for standing balance OT Short Term Goal 5 (Week 1): Pt will groom 2/2 items in standing to improve endurance  Skilled Therapeutic Interventions/Progress Updates:    Treatment session focused on ADLs/self care, transfer training, pt  Caregiver/education, endurance training, and activity tolerance.  Upon entering room, pt finishing ADLs with wife. Reported that he took shower with wife and she provided assistance. Therapist verified ADL training/education completed by previous OT. Shaving completed by pt while seated EOB using electric razor and additional time. Practiced donning cam Boot but required mod A d/t SOB and decreased trunk ROM/fLE flexibility. Pt completed bed>s/c<>toilet transfer with min A and v/c for hand foot placement. Pt completed w/c mobility in facility from room to therapy gym with several rest breaks. 02 sats recorded between 88-93 with instruction on deep breathing techniques. therex completed in w/c for UB strengthening endurance, with 3# dumbbells for 20x 2 in bicep curls and chest press,noted SOB. Overhead press without weights completed. Pt returned to room via w/c and repositioned in room with call bell in reach and wife present to eat lunch.   Therapy Documentation Precautions:  Precautions Precautions: Fall Precaution Comments: debility, watch O2 sats Restrictions Weight Bearing Restrictions: No   Vital Signs: Oxygen  Therapy SpO2: 92 % O2 Device: Nasal Cannula O2 Flow Rate (L/min): 1 L/min Pain: Pain Assessment Pain Assessment: No/denies pain See Function Navigator for Current Functional Status.   Therapy/Group: Individual Therapy  Verdie ShireMolly  Reginald [name] 08/18/2017, 12:42 PM

## 2017-08-19 ENCOUNTER — Inpatient Hospital Stay (HOSPITAL_COMMUNITY): Payer: Self-pay | Admitting: Physical Therapy

## 2017-08-19 ENCOUNTER — Inpatient Hospital Stay (HOSPITAL_COMMUNITY): Payer: Medicaid Other

## 2017-08-19 DIAGNOSIS — R0602 Shortness of breath: Secondary | ICD-10-CM

## 2017-08-19 LAB — BASIC METABOLIC PANEL
ANION GAP: 11 (ref 5–15)
BUN: 17 mg/dL (ref 6–20)
CALCIUM: 8.4 mg/dL — AB (ref 8.9–10.3)
CO2: 22 mmol/L (ref 22–32)
CREATININE: 0.86 mg/dL (ref 0.61–1.24)
Chloride: 97 mmol/L — ABNORMAL LOW (ref 101–111)
Glucose, Bld: 182 mg/dL — ABNORMAL HIGH (ref 65–99)
Potassium: 4.9 mmol/L (ref 3.5–5.1)
SODIUM: 130 mmol/L — AB (ref 135–145)

## 2017-08-19 LAB — GLUCOSE, CAPILLARY
GLUCOSE-CAPILLARY: 142 mg/dL — AB (ref 65–99)
GLUCOSE-CAPILLARY: 142 mg/dL — AB (ref 65–99)
GLUCOSE-CAPILLARY: 163 mg/dL — AB (ref 65–99)
Glucose-Capillary: 178 mg/dL — ABNORMAL HIGH (ref 65–99)

## 2017-08-19 LAB — BRAIN NATRIURETIC PEPTIDE: B NATRIURETIC PEPTIDE 5: 204.8 pg/mL — AB (ref 0.0–100.0)

## 2017-08-19 MED ORDER — FUROSEMIDE 10 MG/ML IJ SOLN
INTRAMUSCULAR | Status: AC
  Filled 2017-08-19: qty 8

## 2017-08-19 MED ORDER — METOLAZONE 10 MG PO TABS
10.0000 mg | ORAL_TABLET | Freq: Once | ORAL | Status: AC
  Administered 2017-08-19: 10 mg via ORAL
  Filled 2017-08-19: qty 1

## 2017-08-19 MED ORDER — ZOLPIDEM TARTRATE 5 MG PO TABS
10.0000 mg | ORAL_TABLET | Freq: Every evening | ORAL | Status: DC | PRN
  Administered 2017-08-19: 10 mg via ORAL
  Filled 2017-08-19: qty 2

## 2017-08-19 MED ORDER — FUROSEMIDE 10 MG/ML IJ SOLN
80.0000 mg | Freq: Once | INTRAMUSCULAR | Status: AC
  Administered 2017-08-19: 80 mg via INTRAVENOUS

## 2017-08-19 NOTE — Progress Notes (Signed)
Rounded to assess patient, administer medication, address constipation. Pt stated he had BM that morning already, hat had been removed from toilet by wife. Reminded pt we were trying to obtain hemocult sample and to please let staff know when he had a bowel movement. Reminded wife to please use toilets located on the unit for infection control.

## 2017-08-19 NOTE — Progress Notes (Signed)
Physical Therapy Session Note  Patient Details  Name: Reginald [name] MRN: [id number] Date of Birth: [birth year]  Today's Date: 08/19/2017 PT Individual Time: 0900-1000 PT Individual Time Calculation (min): 60 min   Short Term Goals: Week 1:  PT Short Term Goal 1 (Week 1): =LTGs due to ELOS Week 2:  PT Short Term Goal 1 (Week 2): =LTGs  Skilled Therapeutic Interventions/Progress Updates:   Pt received supine in bed and agreeable to PT. PT treatment focused on Cardiovascular endurance and strengthening. Supine>sit transfer without assist from PT. Stand pivot transfer to [facility] with RW and supervision assist. PT assisted pt to don cam boot in WC. Gait training with RW and supervision assist from PT, 60fx2. Pt on 1L/min O2, and remained>90% with gait training. Nustep endurance training level 1, 2 x 2.5 min. SpO2 dest to 85% when pt removed nasal canula; increased to 92% in 10 sec when back on 1L/min supplemental 02. Reports dyspnea 7-8/10 following nustep. Pt instructed in standing tolerance 3 x 1 minute at dynavision, program A. After each bout pt rats dyspnea 8/10. 6/10 and 7/10 respectively. [facility] returned to [facility] and all needs met.          Therapy Documentation Precautions:  Precautions Precautions: Fall Precaution Comments: debility, watch O2 sats Restrictions Weight Bearing Restrictions: No Vital Signs: Oxygen Therapy SpO2: 93 % O2 Device: Nasal Cannula O2 Flow Rate (L/min): 2 L/min Pulse Oximetry Type: Intermittent Pain:   1/10 R side neck See Function Navigator for Current Functional Status.   Therapy/Group: Individual Therapy  ALorie Phenix12/29/2018, 10:06 AM

## 2017-08-19 NOTE — Progress Notes (Signed)
Physical Therapy Session Note  Patient Details  Name: Reginald [name] MRN: [id number] Date of Birth: 04-16-1961  Today's Date: 08/19/2017 PT Individual Time: 1400-1500 PT Individual Time Calculation (min): 60 min   Short Term Goals: Week 2:  PT Short Term Goal 1 (Week 2): =LTGs  Skilled Therapeutic Interventions/Progress Updates:    no c/o pain.  Session focus on breathing.  Pt taken to therapy gym total assist for energy conservation.  Stand/pivot to and from therapy mat with distant supervision.  Sit<>supine x2 with supervision. PT instructed pt in pursed lip breathing and diaphragmatic breathing in sitting and semi-recumbent position, with intermittent min/mod cues for inhale through nose, exhale through mouth.  Also provided cues for use of diaphragm in sitting and semi-recumbent position. Pt continues to demo minimal carryover within session and between sessions for breathing techniques, and no carryover for education regarding effects of COPD on breathing.    W/C propulsion x60' for activity tolerance and mobility.  Returned to room at end of session and positioned upright in w/c with call bell in reach and needs met.   Therapy Documentation Precautions:  Precautions Precautions: Fall Precaution Comments: debility, watch O2 sats Restrictions Weight Bearing Restrictions: No   See Function Navigator for Current Functional Status.   Therapy/Group: Individual Therapy  Michel Santee 08/19/2017, 3:06 PM

## 2017-08-19 NOTE — Progress Notes (Signed)
[place] PHYSICAL MEDICINE & REHABILITATION     PROGRESS NOTE  Subjective/Complaints:   Continues with insomnia.  This is a new problem since being hospitalized.  He normally sleeps well.  He apparently has been diagnosed with sleep apnea in the past.  Objective: Vital Signs: Blood pressure 130/79, pulse 96, temperature 98 F (36.7 C), temperature source Oral, resp. rate 20, height 5\' 9"  (1.753 m), weight 246 lb 4.1 oz (111.7 kg), SpO2 93 %.  Overweight male in no acute distress.  He looks older than his stated age of 56. HEENT exam atraumatic, normocephalic, extra ocular muscles are intact. Neck is supple. Chest is clear to auscultation Cardiac exam S1 and S2 are regular. Abdominal exam active bowel sounds, soft, nontender. Extremities with 2+ pretibial edema.  Assessment/Plan: 1. Functional deficits secondary to septic emboli to brain, spine, and muscles  Medical Problem List and Plan: 1.  Debility secondary to septic emboli to the brain, spine, and muscles.    Continue CIR PT, OT,SLP 2.  DVT Prophylaxis/Anticoagulation: SCDs. Monitor for any signs of DVT 3. Pain Management/chronic pain syndrome: Patient currently denies pain.    4. Mood: Provide emotional support 5. Neuropsych: This patient is capable of making decisions on his own behalf. 6. Skin/Wound Care: Routine skin checks 7. Fluids/Electrolytes/Nutrition: Routine I&O's 8. 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 Intravenous Teflaro 600 mg every 12 hours, daptomycin 1000 mg daily 6 weeks total. Confirmed duration with infectious disease 9. Mitral regurgitation. Plan mitral valve replacement after conditioning and rehabilitation program completed per Dr. Zenaida NieceVan Tright 10. Multiple dental extractions 08/07/2017 11. Diabetes mellitus peripheral neuropathy.       Check blood sugars before meals and at bedtime. Provide diabetic teaching    CBG (last 3)  Recent Labs    08/18/17 1626  08/18/17 2159 08/19/17 0647  GLUCAP 218* 186* 178*  Long-acting insulin has been adjusted recently.  He likely needs increased mealtime insulin.  We will adjust dosage.  12. Diastolic congestive heart failure. Lasix 40 mg every 8 hours, decreased to daily due to hyponatremia on 12/23.   Monitor for any signs of fluid overload Filed Weights   08/16/17 0129 08/17/17 0350 08/19/17 0329  Weight: 240 lb 15.4 oz (109.3 kg) 240 lb 4.8 oz (109 kg) 246 lb 4.1 oz (111.7 kg)  13. COPD/tobacco abuse. Counseling  14. Alcohol abuse. Monitor for any signs of withdrawal 15. Hypertension  Controlled.  16. MRSA PCR screening positive. Contact precautions 17. Hyponatremia-improving.    Basic Metabolic Panel:    Component Value Date/Time   NA 129 (L) 08/16/2017 0602   K 4.1 08/16/2017 0602   CL 90 (L) 08/16/2017 0602   CO2 28 08/16/2017 0602   BUN 9 08/16/2017 0602   CREATININE 0.74 08/16/2017 0602   GLUCOSE 174 (H) 08/16/2017 0602   CALCIUM 8.3 (L) 08/16/2017 0602      Continue to monitor 18. ABLA- oral surgery   Hemoglobin 8.2 on 12/21, 7.9 on 12/24, 8.2 on 12/26   negative stool guaic and Low serum Fe and TIBC Fe studies, will supplement   Cont to monitor 19.  Lethargy-improved   UDS neg, resolved after med changes 20.  Insomnia likely multifactorial , hx OSA used CPAP ~8948yrs ago, will check O2 oximetry at noc to document desat No improvement with trazodone will trial ambien 5mg , if no improvement rec increase to 10mg   Did not sleep well on 1228.  Will increase Ambien to 10 mg.  LOS (Days) 9 A FACE TO FACE EVALUATION WAS PERFORMED  Macarena Langseth H Gloria Ricardo 08/19/2017 9:21 AM

## 2017-08-19 NOTE — Progress Notes (Signed)
Pharmacy Antibiotic Note  Reginald [name] is a 56 y.o. male admitted on 08/10/2017 with MRSA bacteremia and endocarditis.  Pharmacy was consulted on 07/31/17 for daptomycin dosing. Rifampin d/c'd 12/18, as no prosthetic material requiring use. Repeat BCx 12/13 negative, 12/26 NG x2 days. Currently AF, WBC WNL. Scr stable WNL. Last CK 17 (12/24).  Plan: Continue daptomycin 1gm (~10mg /kg) IV q24h Per ID, complete 6 weeks total abx (last day 09/10/17) F/u renal fxn, repeat BCx, CK (next 12/31)  Height: 5\' 9"  (175.3 cm) Weight: 246 lb 4.1 oz (111.7 kg) IBW/kg (Calculated) : 70.7  Temp (24hrs), Avg:97.9 F (36.6 C), Min:97.8 F (36.6 C), Max:98 F (36.7 C)  Recent Labs  Lab 08/13/17 0742 08/14/17 0525 08/14/17 1245 08/16/17 0602  WBC  --   --  8.0 8.0  CREATININE 0.71 0.73  --  0.74    Estimated Creatinine Clearance: 125.5 mL/min (by C-G formula based on SCr of 0.74 mg/dL).    Allergies  Allergen Reactions  . Morphine And Related    Antimicrobials this admission: 12/4 [facility] >> 12/10 12/10 ceftaroline >> 12/21 12/12 rifampin >> 12/18 12/10 daptomycin >>  Dose adjustments this admission: 12/7 1800 Vancomycin peak level: 42 12/8 0230 Vancomycin trough level: 38 on Vanc 1500mg  q12h, vanc held 12/9 0230 Vancomycin random level: 21 , dose decreased to 1750mg  q24h  Microbiology results: 11/29, 12/1 and 12/3 [facility] BCx: MRSA (S: bactrim, vanc, dapto, linezolid, TCN) - per Dr. Feliz BeamSnider's note  12/4 BCx: MRSA - Vanc MIC < 0.5 12/5 HIV Ab: negative 12/6 MRSA PCR: positive 12/7 BCx: 2/2 SA 12/10 BCx: MRSA 12/13 BCx: Neg 12/26 BCx: NG x2 days  Roderic ScarceErin N. Zigmund Danieleja, PharmD PGY1 Pharmacy Resident Pager: [phone] 08/19/2017 10:38 AM

## 2017-08-19 NOTE — Consult Note (Signed)
.. ..  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 PCCM was consulted on 08/19/2017 after the patient developed shortness of breath with use of accessory muscles (per the nursing staff and RRT nurse at bedside at time of evaluation) the patient had received Ambien 10 mg which is an increased dose from his usual At 2052.  Subsequently he became short of breath at 2215 using accessory muscles saturations was noted to have O2 sat 97%. Respiratory therapy gave nebulized treatment at that time RRT was called to the bedside, 80 mg of Lasix given, patient did not appropriately respond to the Lasix dose an indwelling Foley catheter was placed patient was started on noninvasive positive pressure ventilation with an IPAP EPAP 20/6 pulling volumes around 700 cc at this time PCCM was at bedside.  Patient's wife also at bedside states that for the last  few nights he is not gotten any sleep nursing staff also remarks that he is lower extremities are remarkably swollen in comparison to previous.  Patient is still able to answer in full sentences knows where he is protecting his airway hemodynamically stable and not desaturating.  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  has a past surgical history that includes Hernia repair (2007); Tonsillectomy /adnoidectomy; RIGHT/LEFT HEART CATH AND CORONARY ANGIOGRAPHY (N/A, 08/02/2017); and Multiple extractions with alveoloplasty (N/A, 08/07/2017). Prior to Admission medications   Medication Sig Start Date End Date Taking? Authorizing Provider  acetaminophen (TYLENOL) 325 MG tablet Take 2 tablets (650 mg total) by mouth every 6 (six) hours as needed for mild pain, fever or headache. 08/10/17   Danford, Earl Liteshristopher P, MD  DAPTOmycin 1,000 mg in sodium chloride 0.9 % 100 mL Inject 1,000 mg into the vein daily. 08/10/17   Danford, Earl Liteshristopher P, MD  feeding supplement, ENSURE ENLIVE, (ENSURE ENLIVE) LIQD Take 237 mLs by mouth 2 (two) times daily between meals. 08/10/17   Danford, Earl Liteshristopher P, MD  folic acid (FOLVITE) 1 MG tablet Take 1 tablet (1 mg total) by mouth daily. 08/11/17   Danford, Earl Liteshristopher P, MD  furosemide (LASIX) 40 MG tablet Take 1 tablet (40 mg total) by mouth every 8 (eight) hours. 08/10/17 08/10/18  Danford, Earl Liteshristopher P, MD  insulin aspart (NOVOLOG) 100 UNIT/ML injection Inject 0-15 Units into the skin 3 (three) times daily with meals. 08/10/17   Danford, Earl Liteshristopher P,  MD  insulin aspart (NOVOLOG) 100 UNIT/ML injection Inject 0-5 Units into the skin at bedtime. 08/10/17   Danford, Earl Lites, MD  insulin glargine (LANTUS) 100 UNIT/ML injection Inject 0.12 mLs (12 Units total) into the skin  daily. 08/11/17   Danford, Earl Lites, MD  metoprolol tartrate (LOPRESSOR) 25 MG tablet Take 1 tablet (25 mg total) by mouth 2 (two) times daily. 08/10/17   Danford, Earl Lites, MD  ondansetron (ZOFRAN) 4 MG/2ML SOLN injection Inject 2 mLs (4 mg total) into the vein every 6 (six) hours as needed for nausea. 08/10/17   Danford, Earl Lites, MD  oxyCODONE (ROXICODONE) 5 MG immediate release tablet Take 1 tablet (5 mg total) by mouth every 8 (eight) hours as needed. 08/10/17 08/10/18  DanfordEarl Lites, MD  potassium chloride 20 MEQ/15ML (10%) SOLN Take 15 mLs (20 mEq total) by mouth 2 (two) times daily. 08/10/17   Danford, Earl Lites, MD  thiamine 100 MG tablet Take 1 tablet (100 mg total) by mouth daily. 08/11/17   Danford, Earl Lites, MD   Allergies  Allergen Reactions  . Morphine And Related     FAMILY HISTORY:  family history includes Hypertension in his father and mother. SOCIAL HISTORY:  reports that he has quit smoking. His smoking use included cigarettes. he has never used smokeless tobacco. He reports that he does not drink alcohol or use drugs.  REVIEW OF SYSTEMS:  Bolded items are pertinent positives Constitutional: Negative for fever, chills, weight loss, malaise/fatigue and diaphoresis.  HENT: Negative for hearing loss, ear pain, nosebleeds, congestion, sore throat, neck pain, tinnitus and ear discharge.   Eyes: Negative for blurred vision, double vision, photophobia, pain, discharge and redness.  Respiratory: Negative for cough, hemoptysis, sputum production, shortness of breath, pillow orthopnea wheezing and stridor.   Cardiovascular: Negative for chest pain, palpitations, orthopnea, claudication, leg swelling and PND.  Gastrointestinal: Negative for heartburn, nausea, vomiting, abdominal pain, diarrhea, constipation, blood in stool and melena.  Genitourinary: Negative for dysuria, urgency, frequency, hematuria and flank pain.  Musculoskeletal: Negative for  myalgias, back pain, joint pain and falls.  lower extremity swelling Skin: Negative for itching and rash.  Neurological: Negative for dizziness, tingling, tremors, sensory change, speech change, focal weakness, seizures, loss of consciousness, weakness and headaches.  Endo/Heme/Allergies: Negative for environmental allergies and polydipsia. Does not bruise/bleed easily.  SUBJECTIVE:   VITAL SIGNS: Temp:  [98 F (36.7 C)-98.6 F (37 C)] 98.6 F (37 C) (12/29 1350) Pulse Rate:  [64-101] 101 (12/29 2238) Resp:  [18-26] 26 (12/29 2238) BP: (123-145)/(52-93) 145/93 (12/29 2238) SpO2:  [88 %-97 %] 97 % (12/29 2215) FiO2 (%):  [100 %] 100 % (12/29 2329) Weight:  [111.7 kg (246 lb 4.1 oz)] 111.7 kg (246 lb 4.1 oz) (12/29 0329)  PHYSICAL EXAMINATION: General: At time of evaluation in no acute distress Neuro: Alert and oriented able to speak in full sentences no focal deficit noted on exam HEENT: Normocephalic atraumatic with BiPAP mask in place with no leak Cardiovascular: S1-S2 appreciated on exam with notable systolic murmur Lungs: Bilateral crackles appreciated mostly in basilar region Abdomen: Distended abdomen soft with positive bowel sounds Musculoskeletal: +2 pitting edema in both lower extremities Skin: Grossly intact, no evidence of rash  Recent Labs  Lab 08/14/17 0525 08/16/17 0602 08/19/17 2300  NA 132* 129* 130*  K 4.4 4.1 4.9  CL 93* 90* 97*  CO2 32 28 22  BUN 10 9 17   CREATININE 0.73 0.74 0.86  GLUCOSE 175* 174* 182*  Recent Labs  Lab 08/14/17 1245 08/16/17 0602  HGB 7.9* 8.2*  HCT 24.5* 25.6*  WBC 8.0 8.0  PLT 271 284   Dg Chest Port 1 View  Result Date: 08/19/2017 CLINICAL DATA:  56 y/o  M; shortness of breath. EXAM: PORTABLE CHEST 1 VIEW COMPARISON:  08/16/2017 chest radiograph. FINDINGS: Increase reticular and patchy opacities of the lungs. Increased small left pleural effusion. Stable cardiac silhouette. Aortic atherosclerosis. Moderate multilevel  degenerative changes of thoracic spine. IMPRESSION: Increase interstitial and alveolar pulmonary edema. Increased small left pleural effusion. Electronically Signed   By: Mitzi HansenLance  Furusawa-Stratton M.D.   On: 08/19/2017 22:47    ASSESSMENT / PLAN: 56 year old male with a past medical history significant for aortic valve and mitral valve endocarditis being actively treated in rehab with IV antibiotics and a plan for mitral valve replacement developed shortness of breath secondary to multifactorial etiology likely cardiogenic pulmonary edema compounded by sedated effect from Ambien  NEURO: Altered mental status secondary to multifactorial etiology Including lack of sleep, high Ambien dose, and respiratory insufficiency discontinue all sedative medications including Ambien at this time Mental status improved after BiPAP and diuresis  CARDIAC: Congestive heart failure with cardiogenic pulmonary edema-continue with diuresis and noninvasive positive pressure ventilation. Hemodynamically stable not requiring vasopressors   PULMONARY: History of COPD-recommended continuing budesonide and DuoNebs as previously scheduled Acute respiratory distress secondary to cardiogenic pulmonary edema Managed with Diuresis and noninvasive positive pressure ventilation  1 hour post an NIPPV initiation obtain an ABG Reviewed chest x-ray personally->Increase interstitial and alveolar pulmonary edema. Increased small left pleural effusion.  ID: Currently on contact isolation for MRSA of the nares Bacterial endocarditis actively being treated with IV antibiotics for 6 weeks Continue current management  GI: Keep patient n.p.o. at this time except for medications Aspiration precautions head of bed elevated greater than 30 degrees  Heme: No signs of active bleeding Not currently on anticoagulation Hgb<7 transfuse PRBCs .[place].O POS DVT PPx with SCDs  RENAL On chronic diuretics for CHF Decreased response to IV  Lasix Supplemented with metolazone Hyponatremia secondary to chronic diuretic use Noted baseline creatinine Lab Results  Component Value Date   CREATININE 0.86 08/19/2017   CREATININE 0.74 08/16/2017   CREATININE 0.73 08/14/2017  Replace electrolytes as needed with goals of potassium at 4 mag 2 Phos 2 ..    I, Dr Newell CoralKristen Deroy Noah have personally reviewed patient's available data, including medical history, events of note, physical examination and test results as part of my evaluation. I have discussed with Elink physician and other care providers such as pharmacist, RN and RRT. The patient is critically ill with multiple organ systems failure and requires high complexity decision making for assessment and support, frequent evaluation and titration of therapies, application of advanced monitoring technologies and extensive interpretation of multiple databases. Critical Care Time devoted to patient care services described in this note is 45 minutes. This time reflects time of care of this signee Dr Newell CoralKristen Mynor Witkop. This critical care time does not reflect procedure time, or teaching time or supervisory time but could involve care discussion time    DISPOSITION: Recommend stepdown under Hospitalist's service  if patient's clinical condition worsens or declines feel free to reconsult P CCM at that time CC TIME: 45 minutes PROGNOSIS: Guarded FAMILY: Wife at bedside aware of all events CODE STATUS: Full   Signed Dr Newell CoralKristen Areebah Meinders Pulmonary Critical Care Locums Pulmonary and Critical Care Medicine [facility] Pager: [phone]  08/19/2017, 11:48 PM

## 2017-08-19 NOTE — Progress Notes (Signed)
PRN ambien 10mg 's given at 2052, increased dose from last night. At that time patient watching movie with wife, without complaint of SOB or pain. At 2215, called to room, patient  SOB, using accessory muscles to breath. Vitals in chart-resp. 26 and labored. O2 sat 97% with neb treatment going. Paged rapid and Dr. Cato MulliganSwords. Stat CXR, labs and lasix IV ordered. Dr. Carlota RaspberryScatliffe up to assess, foley placed and metalazone 10mg  given, per orders.  Rapid response up. Updated Dr. Cato MulliganSwords. Awaiting transfer. Alfredo MartinezMurray, Ustin Cruickshank A

## 2017-08-19 NOTE — Progress Notes (Signed)
Patient ID: Reginald [name], male   DOB: 06/14/1961, 56 y.o.   MRN: [id number] Called by nurse-  Respiratory distress. Ordered labs and cxr.  CCM reviewed patient, suggests hospitalist admit to step down unit.  Information given to [facility] hospitalist operator who will be calling hospitalist.

## 2017-08-20 ENCOUNTER — Inpatient Hospital Stay (HOSPITAL_COMMUNITY): Payer: Self-pay | Admitting: Occupational Therapy

## 2017-08-20 ENCOUNTER — Encounter (HOSPITAL_COMMUNITY): Payer: Self-pay | Admitting: *Deleted

## 2017-08-20 ENCOUNTER — Other Ambulatory Visit: Payer: Self-pay

## 2017-08-20 ENCOUNTER — Inpatient Hospital Stay (HOSPITAL_COMMUNITY)
Admission: AD | Admit: 2017-08-20 | Discharge: 2017-09-06 | DRG: 219 | Disposition: A | Discharge status: Home Health | Payer: Medicaid Other | Source: Other Acute Inpatient Hospital | Attending: Cardiothoracic Surgery | Admitting: Cardiothoracic Surgery

## 2017-08-20 DIAGNOSIS — R1310 Dysphagia, unspecified: Secondary | ICD-10-CM | POA: Diagnosis not present

## 2017-08-20 DIAGNOSIS — R7881 Bacteremia: Secondary | ICD-10-CM | POA: Diagnosis present

## 2017-08-20 DIAGNOSIS — K7689 Other specified diseases of liver: Secondary | ICD-10-CM | POA: Diagnosis present

## 2017-08-20 DIAGNOSIS — I10 Essential (primary) hypertension: Secondary | ICD-10-CM | POA: Diagnosis present

## 2017-08-20 DIAGNOSIS — Z23 Encounter for immunization: Secondary | ICD-10-CM

## 2017-08-20 DIAGNOSIS — I82442 Acute embolism and thrombosis of left tibial vein: Secondary | ICD-10-CM | POA: Diagnosis not present

## 2017-08-20 DIAGNOSIS — I5031 Acute diastolic (congestive) heart failure: Secondary | ICD-10-CM | POA: Diagnosis present

## 2017-08-20 DIAGNOSIS — J449 Chronic obstructive pulmonary disease, unspecified: Secondary | ICD-10-CM | POA: Diagnosis present

## 2017-08-20 DIAGNOSIS — I76 Septic arterial embolism: Secondary | ICD-10-CM | POA: Diagnosis present

## 2017-08-20 DIAGNOSIS — E876 Hypokalemia: Secondary | ICD-10-CM | POA: Diagnosis not present

## 2017-08-20 DIAGNOSIS — I509 Heart failure, unspecified: Secondary | ICD-10-CM

## 2017-08-20 DIAGNOSIS — M79673 Pain in unspecified foot: Secondary | ICD-10-CM

## 2017-08-20 DIAGNOSIS — M4622 Osteomyelitis of vertebra, cervical region: Secondary | ICD-10-CM | POA: Diagnosis present

## 2017-08-20 DIAGNOSIS — Z8249 Family history of ischemic heart disease and other diseases of the circulatory system: Secondary | ICD-10-CM

## 2017-08-20 DIAGNOSIS — Z09 Encounter for follow-up examination after completed treatment for conditions other than malignant neoplasm: Secondary | ICD-10-CM

## 2017-08-20 DIAGNOSIS — E871 Hypo-osmolality and hyponatremia: Secondary | ICD-10-CM | POA: Diagnosis present

## 2017-08-20 DIAGNOSIS — I330 Acute and subacute infective endocarditis: Secondary | ICD-10-CM | POA: Diagnosis present

## 2017-08-20 DIAGNOSIS — Z6834 Body mass index (BMI) 34.0-34.9, adult: Secondary | ICD-10-CM

## 2017-08-20 DIAGNOSIS — R51 Headache: Secondary | ICD-10-CM | POA: Diagnosis not present

## 2017-08-20 DIAGNOSIS — M009 Pyogenic arthritis, unspecified: Secondary | ICD-10-CM | POA: Diagnosis present

## 2017-08-20 DIAGNOSIS — E1142 Type 2 diabetes mellitus with diabetic polyneuropathy: Secondary | ICD-10-CM | POA: Diagnosis present

## 2017-08-20 DIAGNOSIS — D638 Anemia in other chronic diseases classified elsewhere: Secondary | ICD-10-CM | POA: Diagnosis present

## 2017-08-20 DIAGNOSIS — Z792 Long term (current) use of antibiotics: Secondary | ICD-10-CM

## 2017-08-20 DIAGNOSIS — R627 Adult failure to thrive: Secondary | ICD-10-CM | POA: Diagnosis present

## 2017-08-20 DIAGNOSIS — J939 Pneumothorax, unspecified: Secondary | ICD-10-CM

## 2017-08-20 DIAGNOSIS — Z7289 Other problems related to lifestyle: Secondary | ICD-10-CM

## 2017-08-20 DIAGNOSIS — T501X5A Adverse effect of loop [high-ceiling] diuretics, initial encounter: Secondary | ICD-10-CM | POA: Diagnosis not present

## 2017-08-20 DIAGNOSIS — I110 Hypertensive heart disease with heart failure: Principal | ICD-10-CM | POA: Diagnosis present

## 2017-08-20 DIAGNOSIS — R001 Bradycardia, unspecified: Secondary | ICD-10-CM | POA: Diagnosis not present

## 2017-08-20 DIAGNOSIS — Z79899 Other long term (current) drug therapy: Secondary | ICD-10-CM

## 2017-08-20 DIAGNOSIS — E878 Other disorders of electrolyte and fluid balance, not elsewhere classified: Secondary | ICD-10-CM | POA: Diagnosis not present

## 2017-08-20 DIAGNOSIS — I669 Occlusion and stenosis of unspecified cerebral artery: Secondary | ICD-10-CM | POA: Diagnosis present

## 2017-08-20 DIAGNOSIS — I340 Nonrheumatic mitral (valve) insufficiency: Secondary | ICD-10-CM | POA: Diagnosis present

## 2017-08-20 DIAGNOSIS — I272 Pulmonary hypertension, unspecified: Secondary | ICD-10-CM | POA: Diagnosis present

## 2017-08-20 DIAGNOSIS — J9600 Acute respiratory failure, unspecified whether with hypoxia or hypercapnia: Secondary | ICD-10-CM | POA: Diagnosis present

## 2017-08-20 DIAGNOSIS — E669 Obesity, unspecified: Secondary | ICD-10-CM | POA: Diagnosis present

## 2017-08-20 DIAGNOSIS — D62 Acute posthemorrhagic anemia: Secondary | ICD-10-CM | POA: Diagnosis not present

## 2017-08-20 DIAGNOSIS — J9601 Acute respiratory failure with hypoxia: Secondary | ICD-10-CM | POA: Diagnosis present

## 2017-08-20 DIAGNOSIS — I348 Other nonrheumatic mitral valve disorders: Secondary | ICD-10-CM | POA: Diagnosis present

## 2017-08-20 DIAGNOSIS — Z87891 Personal history of nicotine dependence: Secondary | ICD-10-CM

## 2017-08-20 DIAGNOSIS — I2510 Atherosclerotic heart disease of native coronary artery without angina pectoris: Secondary | ICD-10-CM | POA: Diagnosis present

## 2017-08-20 DIAGNOSIS — B9562 Methicillin resistant Staphylococcus aureus infection as the cause of diseases classified elsewhere: Secondary | ICD-10-CM | POA: Diagnosis present

## 2017-08-20 DIAGNOSIS — I5033 Acute on chronic diastolic (congestive) heart failure: Secondary | ICD-10-CM | POA: Diagnosis present

## 2017-08-20 DIAGNOSIS — Z952 Presence of prosthetic heart valve: Secondary | ICD-10-CM

## 2017-08-20 DIAGNOSIS — I059 Rheumatic mitral valve disease, unspecified: Secondary | ICD-10-CM | POA: Diagnosis present

## 2017-08-20 DIAGNOSIS — E1169 Type 2 diabetes mellitus with other specified complication: Secondary | ICD-10-CM | POA: Diagnosis present

## 2017-08-20 DIAGNOSIS — I440 Atrioventricular block, first degree: Secondary | ICD-10-CM | POA: Diagnosis not present

## 2017-08-20 DIAGNOSIS — I058 Other rheumatic mitral valve diseases: Secondary | ICD-10-CM

## 2017-08-20 DIAGNOSIS — Z885 Allergy status to narcotic agent status: Secondary | ICD-10-CM

## 2017-08-20 DIAGNOSIS — Z794 Long term (current) use of insulin: Secondary | ICD-10-CM

## 2017-08-20 HISTORY — DX: Atherosclerotic heart disease of native coronary artery without angina pectoris: I25.10

## 2017-08-20 HISTORY — DX: Nonrheumatic mitral (valve) insufficiency: I34.0

## 2017-08-20 LAB — CBC WITH DIFFERENTIAL/PLATELET
Basophils Absolute: 0 10*3/uL (ref 0.0–0.1)
Basophils Relative: 0 %
EOS ABS: 0.1 10*3/uL (ref 0.0–0.7)
Eosinophils Relative: 1 %
HEMATOCRIT: 25 % — AB (ref 39.0–52.0)
HEMOGLOBIN: 7.8 g/dL — AB (ref 13.0–17.0)
LYMPHS ABS: 2 10*3/uL (ref 0.7–4.0)
LYMPHS PCT: 19 %
MCH: 28.3 pg (ref 26.0–34.0)
MCHC: 31.2 g/dL (ref 30.0–36.0)
MCV: 90.6 fL (ref 78.0–100.0)
MONOS PCT: 3 %
Monocytes Absolute: 0.3 10*3/uL (ref 0.1–1.0)
NEUTROS ABS: 8.2 10*3/uL — AB (ref 1.7–7.7)
NEUTROS PCT: 77 %
Platelets: 262 10*3/uL (ref 150–400)
RBC: 2.76 MIL/uL — AB (ref 4.22–5.81)
RDW: 16.4 % — ABNORMAL HIGH (ref 11.5–15.5)
WBC: 10.5 10*3/uL (ref 4.0–10.5)

## 2017-08-20 LAB — BLOOD GAS, ARTERIAL
Acid-Base Excess: 2.4 mmol/L — ABNORMAL HIGH (ref 0.0–2.0)
BICARBONATE: 26.6 mmol/L (ref 20.0–28.0)
DRAWN BY: 51831
EXPIRATORY PAP: 6
FIO2: 100
Inspiratory PAP: 14
Mode: POSITIVE
O2 Saturation: 100 %
PATIENT TEMPERATURE: 98.6
PO2 ART: 367 mmHg — AB (ref 83.0–108.0)
pCO2 arterial: 42 mmHg (ref 32.0–48.0)
pH, Arterial: 7.417 (ref 7.350–7.450)

## 2017-08-20 MED ORDER — ACETAMINOPHEN 325 MG PO TABS
650.0000 mg | ORAL_TABLET | Freq: Four times a day (QID) | ORAL | Status: DC | PRN
  Administered 2017-08-20 – 2017-08-28 (×12): 650 mg via ORAL
  Filled 2017-08-20 (×13): qty 2

## 2017-08-20 MED ORDER — IPRATROPIUM-ALBUTEROL 0.5-2.5 (3) MG/3ML IN SOLN
3.0000 mL | RESPIRATORY_TRACT | Status: DC | PRN
  Administered 2017-08-20: 3 mL via RESPIRATORY_TRACT
  Filled 2017-08-20: qty 3

## 2017-08-20 MED ORDER — SODIUM CHLORIDE 0.9 % IV SOLN
1000.0000 mg | INTRAVENOUS | Status: DC
  Administered 2017-08-20 – 2017-09-06 (×17): 1000 mg via INTRAVENOUS
  Filled 2017-08-20 (×21): qty 20

## 2017-08-20 MED ORDER — ENSURE ENLIVE PO LIQD
237.0000 mL | Freq: Two times a day (BID) | ORAL | Status: DC
  Administered 2017-08-20 – 2017-08-21 (×3): 237 mL via ORAL

## 2017-08-20 MED ORDER — METOLAZONE 5 MG PO TABS
10.0000 mg | ORAL_TABLET | Freq: Every day | ORAL | Status: DC
  Administered 2017-08-20 – 2017-08-21 (×2): 10 mg via ORAL
  Filled 2017-08-20 (×2): qty 2

## 2017-08-20 MED ORDER — BUDESONIDE 0.25 MG/2ML IN SUSP
0.2500 mg | Freq: Two times a day (BID) | RESPIRATORY_TRACT | Status: DC
  Administered 2017-08-20 – 2017-08-28 (×18): 0.25 mg via RESPIRATORY_TRACT
  Filled 2017-08-20 (×18): qty 2

## 2017-08-20 MED ORDER — SODIUM CHLORIDE 0.9 % IV SOLN
600.0000 mg | Freq: Two times a day (BID) | INTRAVENOUS | Status: DC
  Administered 2017-08-20 – 2017-08-23 (×7): 600 mg via INTRAVENOUS
  Filled 2017-08-20 (×7): qty 600

## 2017-08-20 MED ORDER — SODIUM CHLORIDE 0.9 % IV SOLN: 250.0000 mL | INTRAVENOUS | Status: DC | PRN

## 2017-08-20 MED ORDER — METOPROLOL TARTRATE 25 MG PO TABS
25.0000 mg | ORAL_TABLET | Freq: Two times a day (BID) | ORAL | Status: DC
  Administered 2017-08-20 – 2017-08-28 (×18): 25 mg via ORAL
  Filled 2017-08-20 (×18): qty 1

## 2017-08-20 MED ORDER — LISINOPRIL 2.5 MG PO TABS
2.5000 mg | ORAL_TABLET | Freq: Every day | ORAL | Status: DC
  Administered 2017-08-20 – 2017-08-22 (×3): 2.5 mg via ORAL
  Filled 2017-08-20 (×3): qty 1

## 2017-08-20 MED ORDER — INSULIN ASPART 100 UNIT/ML ~~LOC~~ SOLN
0.0000 [IU] | Freq: Three times a day (TID) | SUBCUTANEOUS | Status: DC
  Administered 2017-08-20: 3 [IU] via SUBCUTANEOUS
  Administered 2017-08-20: 5 [IU] via SUBCUTANEOUS
  Administered 2017-08-21: 3 [IU] via SUBCUTANEOUS
  Administered 2017-08-21: 5 [IU] via SUBCUTANEOUS
  Administered 2017-08-21: 3 [IU] via SUBCUTANEOUS
  Administered 2017-08-22: 5 [IU] via SUBCUTANEOUS
  Administered 2017-08-22: 3 [IU] via SUBCUTANEOUS
  Administered 2017-08-22 – 2017-08-23 (×2): 2 [IU] via SUBCUTANEOUS
  Administered 2017-08-23: 3 [IU] via SUBCUTANEOUS
  Administered 2017-08-23: 2 [IU] via SUBCUTANEOUS
  Administered 2017-08-24: 3 [IU] via SUBCUTANEOUS
  Administered 2017-08-24: 2 [IU] via SUBCUTANEOUS
  Administered 2017-08-24: 3 [IU] via SUBCUTANEOUS
  Administered 2017-08-25: 2 [IU] via SUBCUTANEOUS
  Administered 2017-08-25 – 2017-08-26 (×3): 3 [IU] via SUBCUTANEOUS
  Administered 2017-08-26: 2 [IU] via SUBCUTANEOUS
  Administered 2017-08-27: 3 [IU] via SUBCUTANEOUS
  Administered 2017-08-28 (×2): 2 [IU] via SUBCUTANEOUS
  Administered 2017-08-28: 3 [IU] via SUBCUTANEOUS

## 2017-08-20 MED ORDER — FUROSEMIDE 10 MG/ML IJ SOLN
40.0000 mg | Freq: Four times a day (QID) | INTRAMUSCULAR | Status: DC
  Administered 2017-08-20 – 2017-08-21 (×6): 40 mg via INTRAVENOUS
  Filled 2017-08-20 (×7): qty 4

## 2017-08-20 MED ORDER — INFLUENZA VAC SPLIT QUAD 0.5 ML IM SUSY
0.5000 mL | PREFILLED_SYRINGE | INTRAMUSCULAR | Status: AC
  Administered 2017-08-21: 0.5 mL via INTRAMUSCULAR
  Filled 2017-08-20: qty 0.5

## 2017-08-20 MED ORDER — PNEUMOCOCCAL VAC POLYVALENT 25 MCG/0.5ML IJ INJ
0.5000 mL | INJECTION | INTRAMUSCULAR | Status: AC
  Administered 2017-08-21: 0.5 mL via INTRAMUSCULAR
  Filled 2017-08-20: qty 0.5

## 2017-08-20 MED ORDER — FE FUMARATE-B12-VIT C-FA-IFC PO CAPS
1.0000 | ORAL_CAPSULE | Freq: Three times a day (TID) | ORAL | Status: DC
  Administered 2017-08-20 – 2017-08-28 (×27): 1 via ORAL
  Filled 2017-08-20 (×26): qty 1

## 2017-08-20 MED ORDER — INSULIN GLARGINE 100 UNIT/ML ~~LOC~~ SOLN
14.0000 [IU] | Freq: Every day | SUBCUTANEOUS | Status: DC
  Administered 2017-08-20 – 2017-08-28 (×9): 14 [IU] via SUBCUTANEOUS
  Filled 2017-08-20 (×10): qty 0.14

## 2017-08-20 MED ORDER — ENOXAPARIN SODIUM 40 MG/0.4ML ~~LOC~~ SOLN
40.0000 mg | SUBCUTANEOUS | Status: DC
  Administered 2017-08-20 – 2017-08-23 (×4): 40 mg via SUBCUTANEOUS
  Filled 2017-08-20 (×4): qty 0.4

## 2017-08-20 MED ORDER — LIDOCAINE 5 % EX PTCH
1.0000 | MEDICATED_PATCH | CUTANEOUS | Status: DC
  Administered 2017-08-20 – 2017-08-28 (×8): 1 via TRANSDERMAL
  Filled 2017-08-20 (×8): qty 1

## 2017-08-20 MED ORDER — POTASSIUM CHLORIDE 20 MEQ/15ML (10%) PO SOLN
20.0000 meq | Freq: Two times a day (BID) | ORAL | Status: DC
  Administered 2017-08-20 – 2017-08-28 (×18): 20 meq via ORAL
  Filled 2017-08-20 (×18): qty 15

## 2017-08-20 NOTE — Procedures (Signed)
Responded to Rapid, RN call at 10:53pm for declining patient. Upon arrival, patient noted to be dyphretic, tachypneic, and tachycardic with altered mental status.  BiPAP obtained and placed on pt.  RT and Rapid, RN stayed with patient and transported to [id number] without complications.  Report given to unit RT.

## 2017-08-20 NOTE — Progress Notes (Signed)
Called by Rehab RN about patient having shortness of breath, increased work of breathing, and per RN, patient's more swollen and is volume overloaded. I was with another patient and asked the RN to call the Rehab MD on call and ask for an CXR and orders  As I walked over, I called the RN back, she informed that MD ordered CXR, Lasix 80mg  IV, and BMP/BNP but she was really concerned about the patient. I called RT and asked that they come as well.  When I arrived, patient was sitting up in the chair and was in distress. RR in the 40s, +accessory muscles, short of breath, and patient was altered/confused at times as well.  Skin was cool, + diaphoretic, clammy, gray in appearance, sats were at 86-88% on 2L, + crackles throughout and very diminished lung sounds bilateral. SBP and HR were stable and normal. I spoke with th Rehab MD and updated him. MD will call CCM and in the mean while BIPAP will be started.  BIPAP was started, RR was still in low 40s on 20/6, but after about 20 minutes, work of breathing improved, skin color improved, and patient stated he was feeling better, saturations improved to 100% and maintained. CCM MD did come and evaluated patient, ordered Zaroxolyn 10mg  PO x 1 to help with diuresis, foley was places as well.  TRH was consulted, TRH MD did come and assess patient.  [facility] under [facility].   Patient was transferred to 4E24 on BIPAP.    Interventions: -- BIPAP -- ABG follow up after 1 hour -- Zaroxolyn 10mg  PO x 1 -- TX to 4E24.  Wife at bedside and was updated by medical staff.   Call Time 2214 Start Time 2225 End Time 130

## 2017-08-20 NOTE — Clinical Social Work Note (Signed)
CSW acknowledges consult regarding medication assistance. Please consult RNCM for this need.  Charlynn CourtSarah Lanika Colgate, CSW [phone]

## 2017-08-20 NOTE — Progress Notes (Signed)
Titrated FiO2 to 30%. RCP will continue to monitor.

## 2017-08-20 NOTE — Progress Notes (Signed)
Patient transferred from [facility] with acute respiratory failure and on Bipap. Patient continues to breath using accessory and abdominal muscle. RRT nurse said that is how patient is being transferred. BP slightly elevated and Pt only response to touch or voice. CCMD called. Spouse by the bedside. Provider notified for transfer orders.

## 2017-08-20 NOTE — Progress Notes (Addendum)
PROGRESS NOTE  Reginald [name] [id number] DOB: 09/21/1960 DOA: 08/20/2017 PCP: Patient, No Pcp Per   Brief summary:  He was hospitalized from  12/4 to 12/20 mrsa endocarditis , he was discharged to rehab on iv abx ( ceftaroline and daptomycin) in anticipating cardiac thoracic surgery once he regain some strength. He is sent back to the hospital due to hypoxia, chf exacerbation  HPI/Recap of past 24 hours:  Urine output since admission 2.3liters Feeling much better, denies chest pain, no cough, no fever, He reports has noticed weight gain and increased lower extremity edema for the last 2-3 days while he was at inpatient rehab center He wants bipap be removed Wife at bedside   Assessment/Plan: Principal Problem:   Acute respiratory failure (HCC) Active Problems:   MRSA bacteremia   Mitral valve vegetation   Acute diastolic CHF (congestive heart failure) (HCC)   Endocarditis of mitral valve   Cerebral septic emboli (HCC)   Hyponatremia   Essential hypertension   Type 2 diabetes mellitus with peripheral neuropathy (HCC)  Acute respiratory failure (HCC) -Likely secondary to  CHF exacerbation in the setting of severe mitral valve regurgiation -He is sent from inpatient rehab center to hospital and admitted to stepdown, he is started on iv lasix and put on bipap, continue metolazone for another 24hrs -much improved, continue diuresis, change bipap to prn    MRSA bacteremia  MV endocarditis, large vegetation Possible AV endocarditis CNS, septic emboli to brain Possible multifocal pneumonia on CT C1 vertebral osteo, C4 discitis, paraspinal abscess -He was hospitalized from  12/4 to 12/20 for this , he was discharged to rehab on iv abx ( ceftaroline and daptomycin) in anticipating cardiac thoracic surgery once he regain some strength -Per last infectious disease note on 12/18 by Dr Zenaida NieceVan dam:  "I would send valve tissue for culture from OR.  I would plan on giving him 6  weeks of postoperative IV antibiotics This could be with daptomycin alone potentially" Monitor ck weekly. Continue ceftaroline/daptomycin Case discussed with ct surgery PA. Ct surgery will see patient in consult.  Vertebral osteomyelitis This was noted on MRI C-spine ordered by ID.  There was osteomyelitis of the odontoid process.  Case was discussed with Neurosurgery who reviewed films during last hospitalization, recommended repeat MRI C-spine in 4-6 weeks (Jan 10-20th roughly). -However, in light of neck hyperextension for anesthesia, would recommend repeat MRI imaging before surgery  HTN: continue lopressor and lisinopril, lasix  Diabetes: He was started on insulin during last hospitalization, prior to that he was not on insulin a1c pending Continue insulin  H/o alcohol use, has alcohol delirum needing precedex drip and intubation during last hospitalization. Should not be an issue now , since he has been at rehab since last  Discharge on 12/20.   Code Status: full  Family Communication: patient and wife  Disposition Plan: remain in stepdown   Consultants:  CT surgery  Procedures:  bipap  Antibiotics:  ceftaroline  daptomycin   Objective: BP (!) 145/97 (BP Location: Left Arm)   Pulse 90   Temp 98 F (36.7 C) (Axillary)   Resp (!) 30   Ht 5\' 9"  (1.753 m)   Wt 113.4 kg (250 lb)   SpO2 100%   BMI 36.92 kg/m   Intake/Output Summary (Last 24 hours) at 08/20/2017 0923 Last data filed at 08/20/2017 0911 Gross per 24 hour  Intake -  Output 2300 ml  Net -2300 ml   Filed Weights   08/20/17 0055 08/20/17  0424  Weight: 114.3 kg (251 lb 15.8 oz) 113.4 kg (250 lb)    Exam: Patient is examined daily including today on 08/20/2017, exams remain the same as of yesterday except that has changed    General:  NAD  Cardiovascular: RRR, distant heart sounds  Respiratory: CTABL  Abdomen: Soft/ND/NT, positive BS, well healed old midline surgical scar ( from  prior hernia repair)  Musculoskeletal: bilateral lower extremity pitting Edema  Neuro: alert, oriented   Data Reviewed: Basic Metabolic Panel: Recent Labs  Lab 08/14/17 0525 08/16/17 0602 08/19/17 2300  NA 132* 129* 130*  K 4.4 4.1 4.9  CL 93* 90* 97*  CO2 32 28 22  GLUCOSE 175* 174* 182*  BUN 10 9 17   CREATININE 0.73 0.74 0.86  CALCIUM 8.8* 8.3* 8.4*   Liver Function Tests: Recent Labs  Lab 08/14/17 0525 08/16/17 0602  AST 32 26  ALT 31 30  ALKPHOS 203* 203*  BILITOT 1.0 1.1  PROT 7.7 7.9  ALBUMIN 2.0* 2.0*   No results for input(s): LIPASE, AMYLASE in the last 168 hours. No results for input(s): AMMONIA in the last 168 hours. CBC: Recent Labs  Lab 08/14/17 1245 08/16/17 0602 08/20/17 0326  WBC 8.0 8.0 10.5  NEUTROABS  --   --  8.2*  HGB 7.9* 8.2* 7.8*  HCT 24.5* 25.6* 25.0*  MCV 90.1 89.8 90.6  PLT 271 284 262   Cardiac Enzymes:   Recent Labs  Lab 08/14/17 0525  CKTOTAL 17*   BNP (last 3 results) Recent Labs    07/26/17 0340 08/19/17 2300  BNP 104.6* 204.8*    ProBNP (last 3 results) No results for input(s): PROBNP in the last 8760 hours.  CBG: Recent Labs  Lab 08/18/17 2159 08/19/17 0647 08/19/17 1153 08/19/17 1659 08/19/17 2032  GLUCAP 186* 178* 142* 142* 163*    Recent Results (from the past 240 hour(s))  Culture, blood (Routine X 2) w Reflex to ID Panel     Status: None (Preliminary result)   Collection Time: 08/16/17  6:00 AM  Result Value Ref Range Status   Specimen Description BLOOD LEFT ANTECUBITAL  Final   Special Requests   Final    BOTTLES DRAWN AEROBIC AND ANAEROBIC Blood Culture adequate volume   Culture NO GROWTH 3 DAYS  Final   Report Status PENDING  Incomplete  Culture, blood (Routine X 2) w Reflex to ID Panel     Status: None (Preliminary result)   Collection Time: 08/16/17  2:55 PM  Result Value Ref Range Status   Specimen Description BLOOD LEFT ANTECUBITAL  Final   Special Requests   Final    BOTTLES DRAWN  AEROBIC ONLY Blood Culture adequate volume   Culture NO GROWTH 3 DAYS  Final   Report Status PENDING  Incomplete     Studies: Dg Chest Port 1 View  Result Date: 08/19/2017 CLINICAL DATA:  56 y/o  M; shortness of breath. EXAM: PORTABLE CHEST 1 VIEW COMPARISON:  08/16/2017 chest radiograph. FINDINGS: Increase reticular and patchy opacities of the lungs. Increased small left pleural effusion. Stable cardiac silhouette. Aortic atherosclerosis. Moderate multilevel degenerative changes of thoracic spine. IMPRESSION: Increase interstitial and alveolar pulmonary edema. Increased small left pleural effusion. Electronically Signed   By: Mitzi Hansen M.D.   On: 08/19/2017 22:47    Scheduled Meds: . budesonide  0.25 mg Nebulization BID  . enoxaparin (LOVENOX) injection  40 mg Subcutaneous Q24H  . feeding supplement (ENSURE ENLIVE)  237 mL Oral BID BM  .  ferrous fumarate-b12-vitamic C-folic acid  1 capsule Oral TID PC  . furosemide  40 mg Intravenous Q6H  . [START ON 08/21/2017] Influenza vac split quadrivalent PF  0.5 mL Intramuscular Tomorrow-1000  . insulin aspart  0-15 Units Subcutaneous TID WC  . insulin glargine  14 Units Subcutaneous Daily  . lidocaine  1 patch Transdermal Q24H  . lisinopril  2.5 mg Oral Daily  . metolazone  10 mg Oral Daily  . metoprolol tartrate  25 mg Oral BID  . [START ON 08/21/2017] pneumococcal 23 valent vaccine  0.5 mL Intramuscular Tomorrow-1000  . potassium chloride  20 mEq Oral BID    Continuous Infusions: . sodium chloride    . ceFTAROline (TEFLARO) IV 600 mg (08/20/17 0911)  . DAPTOmycin (CUBICIN)  IV       Time spent: 35 mins from 930 am to 10:055am I have personally reviewed and interpreted on  08/20/2017 daily labs, tele strips, imagings as discussed above under date review session and assessment and plans.  I reviewed all nursing notes, pharmacy notes, consultant notes,  vitals, pertinent old records  I have discussed plan of care as  described above with RN , patient and family on 08/20/2017   Albertine GratesFang Alex Mcmanigal MD, PhD  [facility] Pager [phone]. If 7PM-7AM, please contact night-coverage at [email or web address], password [email or web address] 08/20/2017, 9:23 AM  LOS: 0 days

## 2017-08-20 NOTE — H&P (Signed)
History and Physical    Reginald [name] ZOX:[id number] DOB: 12/31/1960 DOA: 08/20/2017  PCP: Patient, No Pcp Per   Patient coming from: [address] (Rehab unit)  I have personally briefly reviewed patient's old medical records in [facility]  Chief Complaint: Shortness of breath and AMS.  HPI: Reginald [name] is a 56 y.o. male with medical history significant of osteomyelitis of the spine, alcohol abuse, type 2 diabetes, paraspinal abscess, tobacco abuse, nonobstructive CAD, mitral regurgitation who was recently admitted from 07/25/2017 to 08/10/2017 due to mitral valve large vegetation/endocarditis, possible aortic valve endocarditis, septic emboli to the brain, multifocal pneumonia, C1 osteomyelitis, C4 discitis and paraspinal abscess due to MRSA bacteremia.  He was discharged to the rehab unit where he has been continuing the Teflaro and daptomycin IVPB regimen for a total of 6 weeks.  Today, I went to the floor to see the patient due to acute respiratory failure, after the patient became acutely dyspneic, tachycardic and diaphoretic suddenly after being given zolpidem 10 mg p.o.  He has been taking zolpidem 5 mg p.o. at bedtime, but stated that he was not sleeping well.  Tonight was his first night using the 10 mg.  Also, the patient was on furosemide 40 mg every 8 hours, but has only been getting 40 mg daily due to hyponatremia.  He was seen earlier, put on BiPAP and given 80 mg of furosemide by CCM earlier, who recommended stepdown admission.  ED Course: Not applied.  Review of Systems: Unable to review due to acuity, somnolensce and having BiPAP mask on.   Past Medical History:  Diagnosis Date  . Acute osteomyelitis of cervical spine (HCC) 08/10/2017  . Alcohol abuse   . Diabetes mellitus type 2 in obese (HCC)   . Neck pain   . 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 Tobacco abuse     Past Surgical History:  Procedure Laterality Date  . HERNIA REPAIR  2007  .  MULTIPLE EXTRACTIONS WITH ALVEOLOPLASTY N/A 08/07/2017   Procedure: Extraction of tooth #'s 6,11,12,14,19-29 and 32 with alveoloplasty;  Surgeon: Charlynne PanderKulinski, Ronald F, DDS;  [facility];  Service: Oral Surgery;  Laterality: N/A;  . RIGHT/LEFT HEART CATH AND CORONARY ANGIOGRAPHY N/A 08/02/2017   Procedure: RIGHT/LEFT HEART CATH AND CORONARY ANGIOGRAPHY;  Surgeon: Kathleene HazelMcAlhany, Christopher D, MD;  Location: [facility] INVASIVE CV LAB;  Service: Cardiovascular;  Laterality: N/A;  . Tonsillectomy /adnoidectomy     as aa child     reports that he has quit smoking. His smoking use included cigarettes. he has never used smokeless tobacco. He reports that he does not drink alcohol or use drugs.  Allergies  Allergen Reactions  . Morphine And Related     Family History  Problem Relation Age of Onset  . Hypertension Mother   . Hypertension Father     Prior to Admission medications   Medication Sig Start Date End Date Taking? Authorizing Provider  acetaminophen (TYLENOL) 325 MG tablet Take 2 tablets (650 mg total) by mouth every 6 (six) hours as needed for mild pain, fever or headache. 08/10/17   Danford, Earl Liteshristopher P, MD  DAPTOmycin 1,000 mg in sodium chloride 0.9 % 100 mL Inject 1,000 mg into the vein daily. 08/10/17   Danford, Earl Liteshristopher P, MD  feeding supplement, ENSURE ENLIVE, (ENSURE ENLIVE) LIQD Take 237 mLs by mouth 2 (two) times daily between meals. 08/10/17   Danford, Earl Liteshristopher P, MD  folic acid (FOLVITE) 1 MG tablet Take  1 tablet (1 mg total) by mouth daily. 08/11/17   Danford, Earl Liteshristopher P, MD  furosemide (LASIX) 40 MG tablet Take 1 tablet (40 mg total) by mouth every 8 (eight) hours. 08/10/17 08/10/18  Danford, Earl Liteshristopher P, MD  insulin aspart (NOVOLOG) 100 UNIT/ML injection Inject 0-15 Units into the skin 3 (three) times daily with meals. 08/10/17   Danford, Earl Liteshristopher P, MD  insulin aspart (NOVOLOG) 100 UNIT/ML injection Inject 0-5 Units into the skin at bedtime. 08/10/17   Danford,  Earl Liteshristopher P, MD  insulin glargine (LANTUS) 100 UNIT/ML injection Inject 0.12 mLs (12 Units total) into the skin daily. 08/11/17   Danford, Earl Liteshristopher P, MD  metoprolol tartrate (LOPRESSOR) 25 MG tablet Take 1 tablet (25 mg total) by mouth 2 (two) times daily. 08/10/17   Danford, Earl Liteshristopher P, MD  ondansetron (ZOFRAN) 4 MG/2ML SOLN injection Inject 2 mLs (4 mg total) into the vein every 6 (six) hours as needed for nausea. 08/10/17   Danford, Earl Liteshristopher P, MD  oxyCODONE (ROXICODONE) 5 MG immediate release tablet Take 1 tablet (5 mg total) by mouth every 8 (eight) hours as needed. 08/10/17 08/10/18  DanfordEarl Lites, Christopher P, MD  potassium chloride 20 MEQ/15ML (10%) SOLN Take 15 mLs (20 mEq total) by mouth 2 (two) times daily. 08/10/17   Danford, Earl Liteshristopher P, MD  thiamine 100 MG tablet Take 1 tablet (100 mg total) by mouth daily. 08/11/17   Alberteen Samanford, Christopher P, MD    Physical Exam: Vitals:   08/20/17 0055 08/20/17 0300 08/20/17 0357 08/20/17 0424  BP: (!) 145/97     Pulse: 93 90    Resp: (!) 27 (!) 30    Temp: 98 F (36.7 C)     TempSrc: Axillary     SpO2: 100% 100% 100%   Weight: 114.3 kg (251 lb 15.8 oz)   113.4 kg (250 lb)  Height: 5\' 9"  (1.753 m)       Constitutional: Briefly responds to verbal stimuli Eyes: PERRL, lids and conjunctivae normal ENMT: Mucous membranes are moist. Posterior pharynx clear of any exudate or lesions.Normal dentition.  Neck: normal, supple, no masses, no thyromegaly Respiratory: On BiPAP ventilation, tachypneic at 31 bpm, bibasilar crackles, no wheezing. No accessory muscle use.  Cardiovascular: Regular rate and rhythm, loud systolic murmur, no rubs / gallops.  Lower extremities 2+ pitting edema. 2+ pedal pulses. No carotid bruits.  Abdomen: Obese, soft, no tenderness, no masses palpated. No hepatosplenomegaly. Bowel sounds positive.  Musculoskeletal: no clubbing / cyanosis.  Good ROM, no contractures. Normal muscle tone.  Skin: Multiple areas of  ecchymosis on extremities and abdomen. Neurologic: Obtunded.  Unable to fully evaluate. Psychiatric: Briefly responsive to verbal stimuli.   Labs on Admission: I have personally reviewed following labs and imaging studies  CBC: Recent Labs  Lab 08/14/17 1245 08/16/17 0602 08/20/17 0326  WBC 8.0 8.0 10.5  NEUTROABS  --   --  8.2*  HGB 7.9* 8.2* 7.8*  HCT 24.5* 25.6* 25.0*  MCV 90.1 89.8 90.6  PLT 271 284 262   Basic Metabolic Panel: Recent Labs  Lab 08/13/17 0742 08/14/17 0525 08/16/17 0602 08/19/17 2300  NA 127* 132* 129* 130*  K 3.4* 4.4 4.1 4.9  CL 89* 93* 90* 97*  CO2 29 32 28 22  GLUCOSE 233* 175* 174* 182*  BUN 11 10 9 17   CREATININE 0.71 0.73 0.74 0.86  CALCIUM 8.1* 8.8* 8.3* 8.4*   GFR: Estimated Creatinine Clearance: 117.7 mL/min (by C-G formula based on SCr of 0.86 mg/dL). Liver  Function Tests: Recent Labs  Lab 08/14/17 0525 08/16/17 0602  AST 32 26  ALT 31 30  ALKPHOS 203* 203*  BILITOT 1.0 1.1  PROT 7.7 7.9  ALBUMIN 2.0* 2.0*   No results for input(s): LIPASE, AMYLASE in the last 168 hours. No results for input(s): AMMONIA in the last 168 hours. Coagulation Profile: No results for input(s): INR, PROTIME in the last 168 hours. Cardiac Enzymes: Recent Labs  Lab 08/14/17 0525  CKTOTAL 17*   BNP (last 3 results) No results for input(s): PROBNP in the last 8760 hours. HbA1C: No results for input(s): HGBA1C in the last 72 hours. CBG: Recent Labs  Lab 08/18/17 2159 08/19/17 0647 08/19/17 1153 08/19/17 1659 08/19/17 2032  GLUCAP 186* 178* 142* 142* 163*   Lipid Profile: No results for input(s): CHOL, HDL, LDLCALC, TRIG, CHOLHDL, LDLDIRECT in the last 72 hours. Thyroid Function Tests: No results for input(s): TSH, T4TOTAL, FREET4, T3FREE, THYROIDAB in the last 72 hours. Anemia Panel: No results for input(s): VITAMINB12, FOLATE, FERRITIN, TIBC, IRON, RETICCTPCT in the last 72 hours. Urine analysis:    Component Value Date/Time    COLORURINE STRAW (A) 07/28/2017 1330   APPEARANCEUR CLEAR 07/28/2017 1330   LABSPEC 1.010 07/28/2017 1330   PHURINE 5.0 07/28/2017 1330   GLUCOSEU NEGATIVE 07/28/2017 1330   HGBUR NEGATIVE 07/28/2017 1330   BILIRUBINUR NEGATIVE 07/28/2017 1330   KETONESUR NEGATIVE 07/28/2017 1330   PROTEINUR NEGATIVE 07/28/2017 1330   NITRITE NEGATIVE 07/28/2017 1330   LEUKOCYTESUR NEGATIVE 07/28/2017 1330    Radiological Exams on Admission: Dg Chest Port 1 View  Result Date: 08/19/2017 CLINICAL DATA:  56 y/o  M; shortness of breath. EXAM: PORTABLE CHEST 1 VIEW COMPARISON:  08/16/2017 chest radiograph. FINDINGS: Increase reticular and patchy opacities of the lungs. Increased small left pleural effusion. Stable cardiac silhouette. Aortic atherosclerosis. Moderate multilevel degenerative changes of thoracic spine. IMPRESSION: Increase interstitial and alveolar pulmonary edema. Increased small left pleural effusion. Electronically Signed   By: Mitzi Hansen M.D.   On: 08/19/2017 22:47   07/27/2017 echo TEE ------------------------------------------------------------------- LV EF: 60% -   65%  ------------------------------------------------------------------- Indications:      Bacteremia 790.7.  ------------------------------------------------------------------- Study Conclusions  - Left ventricle: Normal LV size. Moderate LVH. Systolic function   was normal. The estimated ejection fraction was in the range of   60% to 65%. Wall motion was normal; there were no regional wall   motion abnormalities. - Aortic valve: Trileaflet. Small echogenic mass at the tip of the   non-coronary cusp. lambl&'s excrescences. Trivial AI. - Aorta: Grade 3 mobile atheroma of the proximal aortic arch. - Mitral valve: Large 2-3 cm mass of the A3 segment of the anterior   leaflet, prolapsing across the posterior leaflet. There is a   distinct posterior jet which is centrally directed of severe    regurgitation, suggestive of perforation. There is dropout seen   through the vegetation or in the area of the P3 scallop - not   entirely clear, but concerning for possible perforation. There is   vegetation to a lesser extent on the posterior leaflet tip.   Effective regurgitant orifice (PISA): 1.01 cm^2. Regurgitant   volume (PISA): 155 ml. - Left atrium: The atrium was dilated. No evidence of thrombus in   the atrial cavity or appendage. - Right atrium: No evidence of thrombus in the atrial cavity or   appendage. - Atrial septum: No defect or patent foramen ovale was identified. - Pulmonic valve: No evidence of vegetation.  Impressions:  - Large mass of the A3 segment of the anterior mitral leaflet,   prolapsing over the posterior leaflet. Posteriorly located   central regurgitant jet suggestive of possible perforation - in   the region of the P3 segment. Endocarditis of the posterior   leaflet tip is noted. There is a possible small mass on the   non-coronary cusp of the aortic valve with trivial AI - this   could be resolved endocarditis or sclerosis as well.  EKG: Independently reviewed.  Pending.  Assessment/Plan Principal Problem:   Acute respiratory failure (HCC) Likely secondary to diastolic CHF exacerbation. Transfer to stepdown/inpatient. Continue supplemental oxygen. Continue BiPAP ventilation. Schedule and as needed bronchodilators. CHF exacerbation treatment.    Acute diastolic CHF (congestive heart failure) (HCC) Continue supplemental oxygen. Continue furosemide 40 mg IVP every 6 hours. Supplementing electrolytes. Monitor intake and output.  Active Problems:   MRSA bacteremia   Mitral valve vegetation   Endocarditis of mitral valve   Cerebral septic emboli (HCC) Continue IV antibiotics as recommended by ID. Ceftaroline 600 mg IVPB every 12 hours. Daptomycin 1000 mg IVPB every 12 hours. Continue planning for mitral valve replacement.     Hyponatremia Likely secondary to furosemide use. Due to volume overload, furosemide has been increased. Monitor sodium closely. Consider hyponatremia workup if no improvement.    Essential hypertension Currently on furosemide. Resume metoprolol 25 mg p.o. twice daily once diastolic CHF decompensation improved. Monitor blood pressure, heart rate, renal function and electrolytes.    Type 2 diabetes mellitus with peripheral neuropathy (HCC) Carbohydrate modified diet once more alert. Resume Lantus 12 units SQ daily once alert and eating. CBG monitoring with regular insulin sliding scale.    DVT prophylaxis: Lovenox SQ. Code Status: Full code. Family Communication: His wife was by bedside. Disposition Plan: Admit to stepdown for closer monitoring and management. Consults called: PCCM evaluated the patient in earlier. Admission status: Inpatient/stepdown.   Bobette Mo MD [facility] Pager [phone].  If 7PM-7AM, please contact night-coverage [email or web address] Password [email or web address]  08/20/2017, 4:52 AM

## 2017-08-20 NOTE — Progress Notes (Signed)
Pharmacy Antibiotic Note  Reginald [name] is a 56 y.o. male admitted on 08/20/2017 from rehab unit to PCU with MRSA bacteremia and endocarditis.  Pharmacy has been consulted to continue Cubicin dosing.  Plan: Continue daptomycin 1gm (~10mg /kg) IV q24h Per ID, complete 6 weeks total abx (last day 09/10/17) F/u renal fxn, repeat BCx, CK (next 12/31)  Weight: 251 lb 15.8 oz (114.3 kg)  Temp (24hrs), Avg:98.2 F (36.8 C), Min:98 F (36.7 C), Max:98.6 F (37 C)  Recent Labs  Lab 08/13/17 0742 08/14/17 0525 08/14/17 1245 08/16/17 0602 08/19/17 2300  WBC  --   --  8.0 8.0  --   CREATININE 0.71 0.73  --  0.74 0.86    Estimated Creatinine Clearance: 118.1 mL/min (by C-G formula based on SCr of 0.86 mg/dL).    Allergies  Allergen Reactions  . Morphine And Related     Antimicrobials: Vanc 12/4 ([facility]) >> 12/10 ceftaroline 12/10 >> 12/21 rifampin 12/12 >> 12/18 daptomycin 12/10 >>  Dose adjustments: 12/7 1800 Vancomycin peak level: 42 12/8 0230 Vancomycin trough level: 38 on Vanc 1500mg  q12h, vanc held 12/9 0230 Vancomycin random level: 21 , dose decreased to 1750mg  q24h  Microbiology results: 11/29, 12/1 and 12/3 [facility] BCx: MRSA (S: bactrim, vanc, dapto, linezolid, TCN) -per Dr. Feliz BeamSnider's note  12/4 BCx: MRSA - Vanc MIC < 0.5 12/5 HIV Ab: negative 12/6 MRSA PCR: positive 12/7 BCx: 2/2 SA 12/10 BCx: MRSA 12/13 BCx: Neg 12/26 BCx: NG x2 days    Thank you for allowing pharmacy to be a part of this patient's care.  Vernard GamblesVeronda Jarrel Knoke, PharmD, BCPS  08/20/2017 1:06 AM

## 2017-08-21 ENCOUNTER — Inpatient Hospital Stay (HOSPITAL_COMMUNITY): Payer: Self-pay | Admitting: Occupational Therapy

## 2017-08-21 ENCOUNTER — Inpatient Hospital Stay (HOSPITAL_COMMUNITY): Payer: Medicaid Other

## 2017-08-21 ENCOUNTER — Inpatient Hospital Stay (HOSPITAL_COMMUNITY): Payer: Self-pay | Admitting: Physical Therapy

## 2017-08-21 ENCOUNTER — Inpatient Hospital Stay (HOSPITAL_COMMUNITY): Payer: Self-pay | Admitting: Speech Pathology

## 2017-08-21 DIAGNOSIS — I340 Nonrheumatic mitral (valve) insufficiency: Secondary | ICD-10-CM

## 2017-08-21 DIAGNOSIS — M79672 Pain in left foot: Secondary | ICD-10-CM

## 2017-08-21 DIAGNOSIS — D649 Anemia, unspecified: Secondary | ICD-10-CM

## 2017-08-21 DIAGNOSIS — I5033 Acute on chronic diastolic (congestive) heart failure: Secondary | ICD-10-CM

## 2017-08-21 DIAGNOSIS — I38 Endocarditis, valve unspecified: Secondary | ICD-10-CM

## 2017-08-21 LAB — GLUCOSE, CAPILLARY
GLUCOSE-CAPILLARY: 179 mg/dL — AB (ref 65–99)
GLUCOSE-CAPILLARY: 199 mg/dL — AB (ref 65–99)
GLUCOSE-CAPILLARY: 201 mg/dL — AB (ref 65–99)
GLUCOSE-CAPILLARY: 152 mg/dL — AB (ref 65–99)
GLUCOSE-CAPILLARY: 172 mg/dL — AB (ref 65–99)
Glucose-Capillary: 233 mg/dL — ABNORMAL HIGH (ref 65–99)

## 2017-08-21 LAB — BASIC METABOLIC PANEL
Anion gap: 9 (ref 5–15)
BUN: 18 mg/dL (ref 6–20)
CALCIUM: 8.5 mg/dL — AB (ref 8.9–10.3)
CO2: 32 mmol/L (ref 22–32)
CREATININE: 0.88 mg/dL (ref 0.61–1.24)
Chloride: 90 mmol/L — ABNORMAL LOW (ref 101–111)
GFR calc Af Amer: >60 mL/min (ref >60)
GLUCOSE: 169 mg/dL — AB (ref 65–99)
Potassium: 2.8 mmol/L — ABNORMAL LOW (ref 3.5–5.1)
SODIUM: 131 mmol/L — AB (ref 135–145)

## 2017-08-21 LAB — CBC
HCT: 24.1 % — ABNORMAL LOW (ref 39.0–52.0)
Hemoglobin: 7.6 g/dL — ABNORMAL LOW (ref 13.0–17.0)
MCH: 28 pg (ref 26.0–34.0)
MCHC: 31.5 g/dL (ref 30.0–36.0)
MCV: 88.9 fL (ref 78.0–100.0)
PLATELETS: 252 10*3/uL (ref 150–400)
RBC: 2.71 MIL/uL — ABNORMAL LOW (ref 4.22–5.81)
RDW: 16.6 % — AB (ref 11.5–15.5)
WBC: 7.2 10*3/uL (ref 4.0–10.5)

## 2017-08-21 LAB — ECHOCARDIOGRAM COMPLETE
Height: 69 in
Weight: 3901.26 oz

## 2017-08-21 LAB — CULTURE, BLOOD (ROUTINE X 2)
Culture: NO GROWTH
Culture: NO GROWTH
Special Requests: ADEQUATE
Special Requests: ADEQUATE

## 2017-08-21 LAB — PREPARE RBC (CROSSMATCH)

## 2017-08-21 LAB — HEMOGLOBIN A1C
HEMOGLOBIN A1C: 5.8 % — AB (ref 4.8–5.6)
Mean Plasma Glucose: 119.76 mg/dL

## 2017-08-21 LAB — MAGNESIUM: Magnesium: 1.6 mg/dL — ABNORMAL LOW (ref 1.7–2.4)

## 2017-08-21 LAB — TSH: TSH: 2.141 u[IU]/mL (ref 0.350–4.500)

## 2017-08-21 LAB — CK: CK TOTAL: 36 U/L — AB (ref 49–397)

## 2017-08-21 MED ORDER — MAGNESIUM SULFATE 2 GM/50ML IV SOLN
2.0000 g | Freq: Once | INTRAVENOUS | Status: AC
  Administered 2017-08-21: 2 g via INTRAVENOUS
  Filled 2017-08-21: qty 50

## 2017-08-21 MED ORDER — FUROSEMIDE 10 MG/ML IJ SOLN
60.0000 mg | Freq: Two times a day (BID) | INTRAMUSCULAR | Status: DC
  Administered 2017-08-22: 60 mg via INTRAVENOUS
  Filled 2017-08-21: qty 6

## 2017-08-21 MED ORDER — POTASSIUM CHLORIDE CRYS ER 20 MEQ PO TBCR
40.0000 meq | EXTENDED_RELEASE_TABLET | ORAL | Status: AC
  Administered 2017-08-21 (×2): 40 meq via ORAL
  Filled 2017-08-21 (×2): qty 2

## 2017-08-21 MED ORDER — FUROSEMIDE 10 MG/ML IJ SOLN
60.0000 mg | Freq: Four times a day (QID) | INTRAMUSCULAR | Status: AC
  Administered 2017-08-21: 60 mg via INTRAVENOUS
  Filled 2017-08-21: qty 6

## 2017-08-21 MED ORDER — GLUCERNA SHAKE PO LIQD
237.0000 mL | Freq: Three times a day (TID) | ORAL | Status: DC
  Administered 2017-08-21 – 2017-08-28 (×17): 237 mL via ORAL
  Filled 2017-08-21: qty 237

## 2017-08-21 MED ORDER — POTASSIUM CHLORIDE CRYS ER 20 MEQ PO TBCR: 20.0000 meq | EXTENDED_RELEASE_TABLET | Freq: Two times a day (BID) | ORAL | Status: DC

## 2017-08-21 NOTE — Progress Notes (Signed)
This RN offered to ambulate with pt. Pt refused at this time, stating that he "wanted to rest for now". Pt educated on importance on getting out of bed and ambulating in hallway. Pt verbalized understanding. Will continue current plan of care.   Berdine DanceLauren Moffitt BSN, RN

## 2017-08-21 NOTE — Progress Notes (Addendum)
PROGRESS NOTE  Reginald [name] WUJ:[id number] DOB: 1960/11/19 DOA: 08/20/2017 PCP: Patient, No Pcp Per   Brief summary:  He was hospitalized from  12/4 to 12/20 mrsa endocarditis , he was discharged to rehab on iv abx ( ceftaroline and daptomycin) in anticipating cardiac thoracic surgery once he regain some strength. He is sent back to the hospital due to hypoxia, chf exacerbation  He reports has noticed weight gain and increased lower extremity edema for the last 2-3 days while he was at inpatient rehab center  HPI/Recap of past 24 hours:  Urine output  6.3liter last 24hrs, 7.9 liter since admission He is off bipap, Feeling much better, denies chest pain, no sob at rest, no cough, no fever,  Wife at bedside   Assessment/Plan: Principal Problem:   Acute respiratory failure (HCC) Active Problems:   MRSA bacteremia   Mitral valve vegetation   Acute diastolic CHF (congestive heart failure) (HCC)   Endocarditis of mitral valve   Cerebral septic emboli (HCC)   Hyponatremia   Essential hypertension   Type 2 diabetes mellitus with peripheral neuropathy (HCC)  Acute respiratory failure (HCC) -Likely secondary to  CHF exacerbation in the setting of severe mitral valve regurgiation -He is sent from inpatient rehab center to hospital and admitted to stepdown, he is started on iv lasix/metolazone and put on bipap,  -much improved, continue lasix, d/c metolazone, change bipap to prn  Bilateral lower extremity edema; Left > right Venous doppler left leg Dg x ray left foot  MRSA bacteremia  MV endocarditis, large vegetation Possible AV endocarditis CNS, septic emboli to brain Possible multifocal pneumonia on CT C1 vertebral osteo, C4 discitis, paraspinal abscess -He was hospitalized from  12/4 to 12/20 for this , he was discharged to rehab on iv abx ( ceftaroline and daptomycin) in anticipating cardiac thoracic surgery once he regain some strength -Per last infectious disease  note on 12/18 by Dr Zenaida Niece dam:  "I would send valve tissue for culture from OR.  I would plan on giving him 6 weeks of postoperative IV antibiotics This could be with daptomycin alone potentially" Monitor ck weekly. Continue ceftaroline/daptomycin -thoracic surgery consulted, will follow recommendation.  Vertebral osteomyelitis This was noted on MRI C-spine ordered by ID.  There was osteomyelitis of the odontoid process.  Case was discussed with Neurosurgery who reviewed films during last hospitalization, recommended repeat MRI C-spine in 4-6 weeks (Jan 10-20th roughly). -However, in light of neck hyperextension for anesthesia, would recommend repeat MRI imaging before surgery  HTN: continue lopressor and lisinopril, lasix  Diabetes: He was started on insulin during last hospitalization, prior to that he was not on insulin a1c 5.8 ( may not be reliable due to anemia, hgb 7.6) Continue insulin  Normocytic Anemia: No sign of bleeding Transfuse prbc x1 on 12/31 Monitor hgb.  H/o alcohol use, has alcohol delirum needing precedex drip and intubation during last hospitalization. Should not be an issue now , since he has been at rehab since last  Discharge on 12/20.   Code Status: full  Family Communication: patient and wife  Disposition Plan: remain in stepdown   Consultants:  CT surgery  Heart failure team  Procedures:  bipap  Antibiotics:  ceftaroline  daptomycin   Objective: BP 114/83 (BP Location: Left Arm)   Pulse 92   Temp 98.5 F (36.9 C) (Oral)   Resp 19   Ht 5\' 9"  (1.753 m)   Wt 110.6 kg (243 lb 13.3 oz)   SpO2 96%  BMI 36.01 kg/m   Intake/Output Summary (Last 24 hours) at 08/21/2017 1123 Last data filed at 08/21/2017 0919 Gross per 24 hour  Intake 1220 ml  Output 5825 ml  Net -4605 ml   Filed Weights   08/20/17 0055 08/20/17 0424 08/21/17 0321  Weight: 114.3 kg (251 lb 15.8 oz) 113.4 kg (250 lb) 110.6 kg (243 lb 13.3 oz)     Exam: Patient is examined daily including today on 08/21/2017, exams remain the same as of yesterday except that has changed    General:  NAD  Cardiovascular: RRR,   Respiratory: mild crackles at basis, no wheezing, no rhonchi  Abdomen: Soft/ND/NT, positive BS, well healed old midline surgical scar ( from prior hernia repair)  Musculoskeletal: bilateral lower extremity pitting Edema has much improved, right lower extremity edema almost resolved, left lower extremity remain edematous  Neuro: alert, oriented   Data Reviewed: Basic Metabolic Panel: Recent Labs  Lab 08/16/17 0602 08/19/17 2300 08/21/17 0510  NA 129* 130* 131*  K 4.1 4.9 2.8*  CL 90* 97* 90*  CO2 28 22 32  GLUCOSE 174* 182* 169*  BUN 9 17 18   CREATININE 0.74 0.86 0.88  CALCIUM 8.3* 8.4* 8.5*  MG  --   --  1.6*   Liver Function Tests: Recent Labs  Lab 08/16/17 0602  AST 26  ALT 30  ALKPHOS 203*  BILITOT 1.1  PROT 7.9  ALBUMIN 2.0*   No results for input(s): LIPASE, AMYLASE in the last 168 hours. No results for input(s): AMMONIA in the last 168 hours. CBC: Recent Labs  Lab 08/14/17 1245 08/16/17 0602 08/20/17 0326 08/21/17 0510  WBC 8.0 8.0 10.5 7.2  NEUTROABS  --   --  8.2*  --   HGB 7.9* 8.2* 7.8* 7.6*  HCT 24.5* 25.6* 25.0* 24.1*  MCV 90.1 89.8 90.6 88.9  PLT 271 284 262 252   Cardiac Enzymes:   Recent Labs  Lab 08/21/17 0510  CKTOTAL 36*   BNP (last 3 results) Recent Labs    07/26/17 0340 08/19/17 2300  BNP 104.6* 204.8*    ProBNP (last 3 results) No results for input(s): PROBNP in the last 8760 hours.  CBG: Recent Labs  Lab 08/20/17 0559 08/20/17 1144 08/20/17 1651 08/20/17 2104 08/21/17 0551  GLUCAP 179* 199* 201* 152* 172*    Recent Results (from the past 240 hour(s))  Culture, blood (Routine X 2) w Reflex to ID Panel     Status: None (Preliminary result)   Collection Time: 08/16/17  6:00 AM  Result Value Ref Range Status   Specimen Description BLOOD  LEFT ANTECUBITAL  Final   Special Requests   Final    BOTTLES DRAWN AEROBIC AND ANAEROBIC Blood Culture adequate volume   Culture NO GROWTH 4 DAYS  Final   Report Status PENDING  Incomplete  Culture, blood (Routine X 2) w Reflex to ID Panel     Status: None (Preliminary result)   Collection Time: 08/16/17  2:55 PM  Result Value Ref Range Status   Specimen Description BLOOD LEFT ANTECUBITAL  Final   Special Requests   Final    BOTTLES DRAWN AEROBIC ONLY Blood Culture adequate volume   Culture NO GROWTH 4 DAYS  Final   Report Status PENDING  Incomplete     Studies: No results found.  Scheduled Meds: . budesonide  0.25 mg Nebulization BID  . enoxaparin (LOVENOX) injection  40 mg Subcutaneous Q24H  . feeding supplement (ENSURE ENLIVE)  237 mL Oral BID  BM  . feeding supplement (GLUCERNA SHAKE)  237 mL Oral TID WC  . ferrous fumarate-b12-vitamic C-folic acid  1 capsule Oral TID PC  . furosemide  40 mg Intravenous Q6H  . insulin aspart  0-15 Units Subcutaneous TID WC  . insulin glargine  14 Units Subcutaneous Daily  . lidocaine  1 patch Transdermal Q24H  . lisinopril  2.5 mg Oral Daily  . metoprolol tartrate  25 mg Oral BID  . potassium chloride  20 mEq Oral BID  . [START ON 08/22/2017] potassium chloride  20 mEq Oral BID  . potassium chloride  40 mEq Oral Q4H    Continuous Infusions: . sodium chloride    . ceFTAROline (TEFLARO) IV 600 mg (08/21/17 1027)  . DAPTOmycin (CUBICIN)  IV Stopped (08/20/17 1448)     Time spent: 35 mins  I have personally reviewed and interpreted on  08/21/2017 daily labs, tele strips, imagings as discussed above under date review session and assessment and plans.  I reviewed all nursing notes, pharmacy notes, consultant notes,  vitals, pertinent old records  I have discussed plan of care as described above with RN , patient and family on 08/21/2017   Albertine GratesFang Shantella Blubaugh MD, PhD  [facility] Pager [phone]. If 7PM-7AM, please contact night-coverage at  [email or web address], password [email or web address] 08/21/2017, 11:23 AM  LOS: 1 day

## 2017-08-21 NOTE — Progress Notes (Signed)
[facility]  [facility] will follow pt with ID team  while inpatient for pending home IV ABX as ordered at DC.   If patient discharges after hours, please call [phone].   Sedalia Mutaamela S Chandler 08/21/2017, 11:17 AM

## 2017-08-21 NOTE — Progress Notes (Signed)
*  PRELIMINARY RESULTS* Echocardiogram 2D Echocardiogram has been performed.  Jeryl Columbialliott, Maritsa Hunsucker 08/21/2017, 3:23 PM

## 2017-08-21 NOTE — Consult Note (Signed)
Advanced Heart Failure Team Consult Note  Primary Cardiologist:  Dr. Rennis Golden  Reason for Consultation: A/C diastolic CHF/Pulmonary Edema  HPI:    Reginald [name] is seen today for evaluation of A/C diastolic CHF/Pulmonary Edema at the request of Dr. Roda Shutters.   Reginald [name] is a 56 y.o. 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   He was discharged to [facility] 08/10/17 on Taflaro and Daptomycin (Boradened due to persistent MRSA bacteremia despite supra therapeutic vancomycin levels) with plans for rehabilitation prior to planned MVR.  IM was re-consulted due to worsening respiratotory status requiring BiPAP and IV lasix.   HF team consulted for optimization prior to potential MVR.   Pertinent labs today include K 2.8, Creatinine 0.88, WBC 7.2, and Hgb 7.6. BCx from 08/16/17  Negative.   Breathing has much improved since move back to floor. Now on O2 via , but sats remains in upper 80s. Having trouble sleeping, but denies orthopnea. SOB walking halls and was acutely SOB yesterday with hyperventilation. Denies lightheadedness or dizziness.   Echo TEE 07/27/17 - LVEF 60-65% - Aortic valve: Trileaflet. Small echogenic mass at the tip of the   non-coronary cusp. lambl&'s excrescences. Trivial AI. - Aorta: Grade 3 mobile atheroma of the proximal aortic arch. - Mitral valve: Large 2-3 cm mass of the A3 segment of the anterior   leaflet, prolapsing across the posterior leaflet. There is a   distinct posterior jet which is centrally directed of severe   regurgitation, suggestive of perforation.  There is dropout seen   through the vegetation or in the area of the P3 scallop - not   entirely clear, but concerning for possible perforation. There is   vegetation to a lesser extent on the posterior leaflet tip.   Effective regurgitant orifice (PISA): 1.01 cm^2. Regurgitant   volume (PISA): 155 ml. - Left atrium: The atrium was dilated. No evidence of thrombus in   the atrial cavity or appendage. - Right atrium: No evidence of thrombus in the atrial cavity or   appendage. - Atrial septum: No defect or patent foramen ovale was identified. - Pulmonic valve: No evidence of vegetation.   R/LHC 08/02/2017 Mild non-obstructive CAD RHC Procedural Findings: Hemodynamics (mmHg) RA mean 8 RV 47/8 PA 57/19 PCWP 20 AO 119/66 Cardiac Output (Fick) 7.86 Cardiac Index (Fick) 3.52  Review of systems complete and found to be negative unless listed in HPI.    Home Medications Prior to Admission medications   Medication Sig Start Date End Date Taking? Authorizing Provider  acetaminophen (TYLENOL) 325 MG tablet Take 2 tablets (650 mg total) by mouth every 6 (six) hours as needed for mild pain, fever or headache. 08/10/17  Yes Danford, Earl Lites, MD  feeding supplement, ENSURE ENLIVE, (ENSURE ENLIVE) LIQD Take 237 mLs by mouth 2 (two) times daily between meals. 08/10/17  Yes Danford, Earl Lites, MD  folic acid (FOLVITE) 1 MG tablet Take 1 tablet (1 mg total) by mouth daily. 08/11/17  Yes Danford, Earl Lites, MD  furosemide (LASIX) 40 MG tablet Take 1 tablet (40 mg total) by mouth every 8 (eight) hours. 08/10/17 08/10/18 Yes Danford, Earl Lites, MD  insulin aspart (NOVOLOG) 100 UNIT/ML  injection Inject 0-15 Units into the skin 3 (three) times daily with meals. 08/10/17  Yes Danford, Earl Lites, MD  insulin aspart (NOVOLOG) 100 UNIT/ML injection Inject 0-5 Units into the skin at bedtime. 08/10/17  Yes Danford, Earl Lites, MD  insulin glargine (LANTUS) 100 UNIT/ML injection Inject  0.12 mLs (12 Units total) into the skin daily. 08/11/17  Yes Danford, Earl Lites, MD  metoprolol tartrate (LOPRESSOR) 25 MG tablet Take 1 tablet (25 mg total) by mouth 2 (two) times daily. 08/10/17  Yes Danford, Earl Lites, MD  ondansetron (ZOFRAN) 4 MG/2ML SOLN injection Inject 2 mLs (4 mg total) into the vein every 6 (six) hours as needed for nausea. 08/10/17  Yes Danford, Earl Lites, MD  oxyCODONE (ROXICODONE) 5 MG immediate release tablet Take 1 tablet (5 mg total) by mouth every 8 (eight) hours as needed. 08/10/17 08/10/18 Yes Danford, Earl Lites, MD  potassium chloride 20 MEQ/15ML (10%) SOLN Take 15 mLs (20 mEq total) by mouth 2 (two) times daily. 08/10/17  Yes Danford, Earl Lites, MD  thiamine 100 MG tablet Take 1 tablet (100 mg total) by mouth daily. 08/11/17  Yes Danford, Earl Lites, MD  DAPTOmycin 1,000 mg in sodium chloride 0.9 % 100 mL Inject 1,000 mg into the vein daily. 08/10/17   Danford, Earl Lites, MD  gabapentin (NEURONTIN) 300 MG capsule Take 300 mg by mouth 2 (two) times daily. 08/16/17   [provider]    Past Medical History: Past Medical History:  Diagnosis Date  . Acute osteomyelitis of cervical spine (HCC) 08/10/2017  . Alcohol abuse   . CAD (coronary artery disease)   . Diabetes mellitus type 2 in obese (HCC)   . Mitral regurgitation   . Neck pain   . 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 Tobacco abuse     Past Surgical History: Past Surgical History:  Procedure Laterality Date  . HERNIA REPAIR  2007  . MULTIPLE EXTRACTIONS WITH ALVEOLOPLASTY N/A 08/07/2017   Procedure: Extraction of tooth #'s 6,11,12,14,19-29 and 32 with alveoloplasty;  Surgeon: Charlynne Pander, DDS;  Location: [facility] OR;  Service: Oral Surgery;  Laterality: N/A;  . RIGHT/LEFT HEART CATH AND CORONARY ANGIOGRAPHY N/A 08/02/2017   Procedure: RIGHT/LEFT HEART CATH AND CORONARY ANGIOGRAPHY;  Surgeon: Kathleene Hazel, MD;  Location: [facility] INVASIVE CV LAB;   Service: Cardiovascular;  Laterality: N/A;  . Tonsillectomy /adnoidectomy     as aa child    Family History: Family History  Problem Relation Age of Onset  . Hypertension Mother   . Hypertension Father     Social History: Social History   Socioeconomic History  . Marital status: Married    Spouse name: None  . Number of children: None  . Years of education: None  . Highest education level: None  Social Needs  . Financial resource strain: None  . Food insecurity - worry: None  . Food insecurity - inability: None  . Transportation needs - medical: None  . Transportation needs - non-medical: None  Occupational History  . None  Tobacco Use  . Smoking status: Former Smoker    Types: Cigarettes  . Smokeless tobacco: Never Used  Substance and Sexual Activity  . Alcohol use: No    Frequency: Never  . Drug use: No  . Sexual activity: Not Currently    Partners: Female  Other Topics Concern  . None  Social History Narrative  . None    Allergies:  Allergies  Allergen Reactions  . Morphine And Related  Other (See Comments)    "freaks out"    Objective:    Vital Signs:   Temp:  [98 F (36.7 C)-98.5 F (36.9 C)] 98.5 F (36.9 C) (12/31 1236) Pulse Rate:  [79-95] 87 (12/31 1236) Resp:  [19-26] 26 (12/31 1236) BP: (104-114)/(65-83) 106/74 (12/31 1236) SpO2:  [93 %-100 %] 100 % (12/31 1236) Weight:  [243 lb 13.3 oz (110.6 kg)] 243 lb 13.3 oz (110.6 kg) (12/31 0321) Last BM Date: 08/19/17  Weight change: Filed Weights   08/20/17 0055 08/20/17 0424 08/21/17 0321  Weight: 251 lb 15.8 oz (114.3 kg) 250 lb (113.4 kg) 243 lb 13.3 oz (110.6 kg)    Intake/Output:   Intake/Output Summary (Last 24 hours) at 08/21/2017 1457 Last data filed at 08/21/2017 1332 Gross per 24 hour  Intake 1100 ml  Output 5925 ml  Net -4825 ml     Physical Exam    General:  Chronically ill appearing. No resp difficulty HEENT: normal Neck: supple. JVP at least 8-9 cm. Carotids 2+  bilat; no bruits. No lymphadenopathy or thyromegaly appreciated. Cor: PMI nondisplaced. Regular rate & rhythm. 2/6 MR  Lungs: Diminished basilar sounds Abdomen: soft, nontender, nondistended. No hepatosplenomegaly. No bruits or masses. Good bowel sounds. Extremities: no cyanosis, clubbing, or rash. 1+ ankle edema.  Neuro: alert & orientedx3, cranial nerves grossly intact. moves all 4 extremities w/o difficulty. Affect pleasant   Telemetry   NSR 80-90s  EKG    NSR 90s, personally reviewed  Labs   Basic Metabolic Panel: Recent Labs  Lab 08/16/17 0602 08/19/17 2300 08/21/17 0510  NA 129* 130* 131*  K 4.1 4.9 2.8*  CL 90* 97* 90*  CO2 28 22 32  GLUCOSE 174* 182* 169*  BUN 9 17 18   CREATININE 0.74 0.86 0.88  CALCIUM 8.3* 8.4* 8.5*  MG  --   --  1.6*    Liver Function Tests: Recent Labs  Lab 08/16/17 0602  AST 26  ALT 30  ALKPHOS 203*  BILITOT 1.1  PROT 7.9  ALBUMIN 2.0*   No results for input(s): LIPASE, AMYLASE in the last 168 hours. No results for input(s): AMMONIA in the last 168 hours.  CBC: Recent Labs  Lab 08/16/17 0602 08/20/17 0326 08/21/17 0510  WBC 8.0 10.5 7.2  NEUTROABS  --  8.2*  --   HGB 8.2* 7.8* 7.6*  HCT 25.6* 25.0* 24.1*  MCV 89.8 90.6 88.9  PLT 284 262 252    Cardiac Enzymes: Recent Labs  Lab 08/21/17 0510  CKTOTAL 36*    BNP: BNP (last 3 results) Recent Labs    07/26/17 0340 08/19/17 2300  BNP 104.6* 204.8*    ProBNP (last 3 results) No results for input(s): PROBNP in the last 8760 hours.   CBG: Recent Labs  Lab 08/20/17 1144 08/20/17 1651 08/20/17 2104 08/21/17 0551 08/21/17 1139  GLUCAP 199* 201* 152* 172* 233*    Coagulation Studies: No results for input(s): LABPROT, INR in the last 72 hours.   Imaging   Dg Foot Complete Left  Result Date: 08/21/2017 CLINICAL DATA:  Pain and swelling EXAM: LEFT FOOT - COMPLETE 3+ VIEW COMPARISON:  August 13, 2017 FINDINGS: Frontal, oblique, and lateral views  were obtained. There is again noted a fracture of the proximal metaphysis of the fifth proximal phalanx with alignment near anatomic. There is minimal healing response in this area. No other fracture evident. No dislocation. There is narrowing of the first MTP joint. Other joint spaces appear unremarkable. There is pes  cavus. There are spurs arising from the posterior and inferior calcaneus. IMPRESSION: Fracture proximal aspect of fifth proximal phalanx, stable from 1 week prior. Minimal healing response in this area. No other fracture. No dislocation. There are calcaneal spurs. There is pes cavus. There is narrowing of the first MTP joint. Electronically Signed   By: Bretta Bang III M.D.   On: 08/21/2017 13:20      Medications:     Current Medications: . budesonide  0.25 mg Nebulization BID  . enoxaparin (LOVENOX) injection  40 mg Subcutaneous Q24H  . feeding supplement (GLUCERNA SHAKE)  237 mL Oral TID WC  . ferrous fumarate-b12-vitamic C-folic acid  1 capsule Oral TID PC  . furosemide  40 mg Intravenous Q6H  . insulin aspart  0-15 Units Subcutaneous TID WC  . insulin glargine  14 Units Subcutaneous Daily  . lidocaine  1 patch Transdermal Q24H  . lisinopril  2.5 mg Oral Daily  . metoprolol tartrate  25 mg Oral BID  . potassium chloride  20 mEq Oral BID  . [START ON 08/22/2017] potassium chloride  20 mEq Oral BID     Infusions: . sodium chloride    . ceFTAROline (TEFLARO) IV Stopped (08/21/17 1127)  . DAPTOmycin (CUBICIN)  IV Stopped (08/20/17 1448)       Patient Profile   Reginald [name] is a 56 y.o. 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   Admitted back to hospital from [facility] 08/20/17 with resp distress and edema  Plan for potential MVR week of 1/7  Assessment/Plan   1. Acute respiratory failure - Improved with BiPAP and now off.   - Continue O2 via [place]. Not on at home. Continues to require with sats in upper 80s. - Encouraged IS.  2. Acute on chronic diastolic CHF - Echo TEE 07/27/17 LVEF 60-65%  (TTE pending) - Volume status remains at least mildly overloaded - Give IV lasix 60 mg this afternoon and follows. (Requests we limit evening doses with trouble sleeping) - Good response symptomatically with lasix IV 40 q 6 hrs. Negative 5.3 L and down 7 lbs.  3. MRSA bacteremia - BCx 08/16/17 NGTD.  Remains on ABX per ID - Getting Teflaro and Daptomycin - Unclear source. Denies Hx of IVDU. Pt had very poor dentition prior to removal of teeth by Dr. Kristin Bruins, but no overt infection.  4. Severe MR with large vegetation - Plan for MVR potentially next week. Will need to be able to lie flat for procedure/will need to clear pulmonary edema per Dr. Donata Clay  5. DM2 - Per primary  6. Hypokalemia - Will supp with diuresis. Extra given this am.  7. Hyponatremia - Free water restrict. - Continue to follow closely with diuresis  8. Normocytic Anemia - 1 UPRBCs ordered for today. No overt bleeding.  Medication concerns reviewed with patient and pharmacy team. Barriers identified: No insurance, ? Compliance.   Length of Stay: 1  Reginald [name]  08/21/2017, 2:57 PM  Advanced Heart Failure Team Pager [phone] (M-F; 7a - 4p)  Please contact [facility] for night-coverage after hours (4p -7a ) and weekends on [email or web address]  Patient seen with PA, agree with  the above note.   Patient has been in CIR for rehab prior to MVR.  However, gained weight and became significantly short of breath.  Admitted for diuresis.  On exam, he has a 3/6  systolic murmur at apex and JVP 10-12 cm.   1. Acute on chronic diastolic CHF: In setting of severe MR.  Ultimately, he needs mitral valve replacement. He is diuresing well with IV Lasix.  - Continue Lasix 60 mg IV bid.  - Replace K and Mg.  2. Mitral valve endocarditis: MRSA.  Last blood cultures 12/25 NGTD.  Afebrile.  He continues on Taflaro and daptomycin.  Severe MR, suspect perforated leaflet.  Will need MVR when hemodynamically optimized.  3. Anemia: Suspect anemia of chronic disease, got 1 unit PRBCs today.   Work with cardiac rehab.   Reginald [name] 08/21/2017 4:57 PM

## 2017-08-21 NOTE — Progress Notes (Signed)
  Subjective: Events noted- MRSA mitral endocarditis Back in acute care unit for pulmonary edema and respiratory distress- better with iv lasix Pt sched for MVR next week- will need to be clear of pulmonary edema and able to ambulate 100 feet  He has symptomatic anemia of chronic disease contributing to symptomatic CHF so will give 1 unit PRBC preop  Objective: Vital signs in last 24 hours: Temp:  [98 F (36.7 C)-98.6 F (37 C)] 98.5 F (36.9 C) (12/31 0800) Pulse Rate:  [79-95] 92 (12/31 0834) Cardiac Rhythm: Normal sinus rhythm (12/31 0700) Resp:  [19-25] 19 (12/31 0834) BP: (104-114)/(65-83) 114/83 (12/31 0800) SpO2:  [93 %-100 %] 96 % (12/31 0834) Weight:  [243 lb 13.3 oz (110.6 kg)] 243 lb 13.3 oz (110.6 kg) (12/31 0321)  Hemodynamic parameters for last 24 hours:    Intake/Output from previous day: 12/30 0701 - 12/31 0700 In: 980 [P.O.:360; IV Piggyback:620] Out: 6300 [Urine:6300] Intake/Output this shift: Total I/O In: 240 [P.O.:240] Out: 2225 [Urine:2225]  Appears chronically ill  3/6 MR murmur Lab Results: Recent Labs    08/20/17 0326 08/21/17 0510  WBC 10.5 7.2  HGB 7.8* 7.6*  HCT 25.0* 24.1*  PLT 262 252   BMET:  Recent Labs    08/19/17 2300 08/21/17 0510  NA 130* 131*  K 4.9 2.8*  CL 97* 90*  CO2 22 32  GLUCOSE 182* 169*  BUN 17 18  CREATININE 0.86 0.88  CALCIUM 8.4* 8.5*    PT/INR: No results for input(s): LABPROT, INR in the last 72 hours. ABG    Component Value Date/Time   PHART 7.417 08/20/2017 0130   HCO3 26.6 08/20/2017 0130   TCO2 31 08/02/2017 1553   O2SAT 100.0 08/20/2017 0130   CBG (last 3)  Recent Labs    08/20/17 1651 08/20/17 2104 08/21/17 0551  GLUCAP 201* 152* 172*    Assessment/Plan: S/P  Prepare for MVR early next week Needs repeat echo -last echo 4 wks ago PT , cardiac rehab ordered Will follow  LOS: 1 day    Kathlee Nationseter Van Trigt III 08/21/2017

## 2017-08-21 NOTE — Discharge Summary (Signed)
Discharge summary job [id number]

## 2017-08-22 ENCOUNTER — Inpatient Hospital Stay (HOSPITAL_COMMUNITY): Payer: Medicaid Other

## 2017-08-22 DIAGNOSIS — I5031 Acute diastolic (congestive) heart failure: Secondary | ICD-10-CM

## 2017-08-22 DIAGNOSIS — I330 Acute and subacute infective endocarditis: Secondary | ICD-10-CM

## 2017-08-22 LAB — BPAM RBC
Blood Product Expiration Date: 201901202359
ISSUE DATE / TIME: 201812311528
Unit Type and Rh: 5100

## 2017-08-22 LAB — BASIC METABOLIC PANEL
Anion gap: 10 (ref 5–15)
BUN: 18 mg/dL (ref 6–20)
CHLORIDE: 86 mmol/L — AB (ref 101–111)
CO2: 34 mmol/L — ABNORMAL HIGH (ref 22–32)
CREATININE: 0.91 mg/dL (ref 0.61–1.24)
Calcium: 8.6 mg/dL — ABNORMAL LOW (ref 8.9–10.3)
GFR calc Af Amer: >60 mL/min (ref >60)
GFR calc non Af Amer: >60 mL/min (ref >60)
GLUCOSE: 207 mg/dL — AB (ref 65–99)
POTASSIUM: 3.7 mmol/L (ref 3.5–5.1)
Sodium: 130 mmol/L — ABNORMAL LOW (ref 135–145)

## 2017-08-22 LAB — MAGNESIUM: Magnesium: 2.1 mg/dL (ref 1.7–2.4)

## 2017-08-22 LAB — GLUCOSE, CAPILLARY
GLUCOSE-CAPILLARY: 163 mg/dL — AB (ref 65–99)
GLUCOSE-CAPILLARY: 229 mg/dL — AB (ref 65–99)
Glucose-Capillary: 166 mg/dL — ABNORMAL HIGH (ref 65–99)
Glucose-Capillary: 155 mg/dL — ABNORMAL HIGH (ref 65–99)
Glucose-Capillary: 149 mg/dL — ABNORMAL HIGH (ref 65–99)
Glucose-Capillary: 166 mg/dL — ABNORMAL HIGH (ref 65–99)

## 2017-08-22 LAB — TYPE AND SCREEN
ABO/RH(D): O POS
Antibody Screen: NEGATIVE
Unit division: 0

## 2017-08-22 MED ORDER — LISINOPRIL 5 MG PO TABS
5.0000 mg | ORAL_TABLET | Freq: Every day | ORAL | Status: DC
  Administered 2017-08-23 – 2017-08-28 (×6): 5 mg via ORAL
  Filled 2017-08-22 (×6): qty 1

## 2017-08-22 MED ORDER — POTASSIUM CHLORIDE CRYS ER 20 MEQ PO TBCR
40.0000 meq | EXTENDED_RELEASE_TABLET | Freq: Once | ORAL | Status: AC
  Administered 2017-08-22: 40 meq via ORAL
  Filled 2017-08-22: qty 2

## 2017-08-22 MED ORDER — FUROSEMIDE 10 MG/ML IJ SOLN
60.0000 mg | Freq: Once | INTRAMUSCULAR | Status: AC
  Administered 2017-08-22: 60 mg via INTRAVENOUS
  Filled 2017-08-22: qty 6

## 2017-08-22 NOTE — Progress Notes (Addendum)
Patient ID: Reginald [name], male   DOB: [birth year], 57 y.o.   MRN: [id number]     Advanced Heart Failure Rounding Note  HF Cardiology: Shirlee Latch  Subjective:    Very good diuresis yesterday, weight down 12 lbs (?accurate).  Breathing improved.  Remains afebrile.    Objective:   Weight Range: 231 lb 8 oz (105 kg) Body mass index is 34.19 kg/m.   Vital Signs:   Temp:  [98 F (36.7 C)-99.2 F (37.3 C)] 98 F (36.7 C) (01/01 0800) Pulse Rate:  [80-88] 88 (01/01 0849) Resp:  [18-27] 22 (01/01 0800) BP: (96-124)/(66-76) 106/76 (01/01 0849) SpO2:  [94 %-100 %] 97 % (01/01 0800) Weight:  [231 lb 8 oz (105 kg)] 231 lb 8 oz (105 kg) (01/01 0402) Last BM Date: 08/19/17  Weight change: Filed Weights   08/20/17 0424 08/21/17 0321 08/22/17 0402  Weight: 250 lb (113.4 kg) 243 lb 13.3 oz (110.6 kg) 231 lb 8 oz (105 kg)    Intake/Output:   Intake/Output Summary (Last 24 hours) at 08/22/2017 1032 Last data filed at 08/21/2017 2156 Gross per 24 hour  Intake 1352.5 ml  Output 2850 ml  Net -1497.5 ml      Physical Exam    General:  Well appearing. No resp difficulty HEENT: Normal Neck: Supple. JVP 8-9 cm. Carotids 2+ bilat; no bruits. No lymphadenopathy or thyromegaly appreciated. Cor: PMI nondisplaced. Regular rate & rhythm. No rubs, gallops.  3/6 HSM apex. Lungs: Clear Abdomen: Soft, nontender, nondistended. No hepatosplenomegaly. No bruits or masses. Good bowel sounds. Extremities: No cyanosis, clubbing, rash, edema Neuro: Alert & orientedx3, cranial nerves grossly intact. moves all 4 extremities w/o difficulty. Affect pleasant   Telemetry   NSR 90s (personally reviewed)  Labs    CBC Recent Labs    08/20/17 0326 08/21/17 0510  WBC 10.5 7.2  NEUTROABS 8.2*  --   HGB 7.8* 7.6*  HCT 25.0* 24.1*  MCV 90.6 88.9  PLT 262 252   Basic Metabolic Panel Recent Labs    40/98/11 0510 08/22/17 0211  NA 131* 130*  K 2.8* 3.7  CL 90* 86*  CO2 32 34*  GLUCOSE 169* 207*    BUN 18 18  CREATININE 0.88 0.91  CALCIUM 8.5* 8.6*  MG 1.6* 2.1   Liver Function Tests No results for input(s): AST, ALT, ALKPHOS, BILITOT, PROT, ALBUMIN in the last 72 hours. No results for input(s): LIPASE, AMYLASE in the last 72 hours. Cardiac Enzymes Recent Labs    08/21/17 0510  CKTOTAL 36*    BNP: BNP (last 3 results) Recent Labs    07/26/17 0340 08/19/17 2300  BNP 104.6* 204.8*    ProBNP (last 3 results) No results for input(s): PROBNP in the last 8760 hours.   D-Dimer No results for input(s): DDIMER in the last 72 hours. Hemoglobin A1C Recent Labs    08/21/17 0510  HGBA1C 5.8*   Fasting Lipid Panel No results for input(s): CHOL, HDL, LDLCALC, TRIG, CHOLHDL, LDLDIRECT in the last 72 hours. Thyroid Function Tests Recent Labs    08/21/17 0510  TSH 2.141    Other results:   Imaging    Dg Chest 2 View  Result Date: 08/22/2017 CLINICAL DATA:  57 year old male with a history of congestive heart failure EXAM: CHEST  2 VIEW COMPARISON:  08/19/2017, 08/16/2017 FINDINGS: Cardiomediastinal silhouette unchanged in size and contour. Interlobular septal thickening bilateral lungs with mixed linear and patchy airspace opacities. Compare to the prior there is mild improved aeration on the  left. No pneumothorax. Blunting at the left costophrenic angle with meniscus on the lateral view. IMPRESSION: Mildly improved aeration of the left lung, with evidence of persisting pulmonary edema and small left pleural effusion. Electronically Signed   By: Gilmer Mor D.O.   On: 08/22/2017 09:44   Dg Foot Complete Left  Result Date: 08/21/2017 CLINICAL DATA:  Pain and swelling EXAM: LEFT FOOT - COMPLETE 3+ VIEW COMPARISON:  August 13, 2017 FINDINGS: Frontal, oblique, and lateral views were obtained. There is again noted a fracture of the proximal metaphysis of the fifth proximal phalanx with alignment near anatomic. There is minimal healing response in this area. No other  fracture evident. No dislocation. There is narrowing of the first MTP joint. Other joint spaces appear unremarkable. There is pes cavus. There are spurs arising from the posterior and inferior calcaneus. IMPRESSION: Fracture proximal aspect of fifth proximal phalanx, stable from 1 week prior. Minimal healing response in this area. No other fracture. No dislocation. There are calcaneal spurs. There is pes cavus. There is narrowing of the first MTP joint. Electronically Signed   By: Bretta Bang III M.D.   On: 08/21/2017 13:20      Medications:     Scheduled Medications: . budesonide  0.25 mg Nebulization BID  . enoxaparin (LOVENOX) injection  40 mg Subcutaneous Q24H  . feeding supplement (GLUCERNA SHAKE)  237 mL Oral TID WC  . ferrous fumarate-b12-vitamic C-folic acid  1 capsule Oral TID PC  . furosemide  60 mg Intravenous Once  . insulin aspart  0-15 Units Subcutaneous TID WC  . insulin glargine  14 Units Subcutaneous Daily  . lidocaine  1 patch Transdermal Q24H  . [START ON 08/23/2017] lisinopril  5 mg Oral Daily  . metoprolol tartrate  25 mg Oral BID  . potassium chloride  20 mEq Oral BID  . potassium chloride  40 mEq Oral Once     Infusions: . sodium chloride    . ceFTAROline (TEFLARO) IV 600 mg (08/22/17 0902)  . DAPTOmycin (CUBICIN)  IV 1,000 mg (08/21/17 1820)     PRN Medications:  sodium chloride, acetaminophen, ipratropium-albuterol    Patient Profile   Reginald [name] is a 57 y.o. 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   Admitted back to hospital from  [facility] 08/20/17 with resp distress and edema  Plan for potential MVR week of 1/7  Assessment/Plan   1. Acute on chronic diastolic CHF: In setting of severe MR.  Ultimately, he needs mitral valve replacement. He is diuresing well with IV Lasix. Volume status looks better today, probably mildly overloaded.  - Continue Lasix 60 mg IV bid x 1 more day and reassess in am.  - Replace K.  2. 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  Last blood cultures 12/25 NGTD.  Afebrile.  He continues on Taflaro and daptomycin.  Severe MR, suspect perforated leaflet.  Will need MVR when hemodynamically optimized.  - Will need to discuss timing of valve surgery with Dr Donata Clay tomorrow.  - Has BP room to increase lisinopril to 5 mg daily for afterload reduction.  3. Anemia: Suspect anemia of chronic disease, got 1 unit PRBCs 12/31, hgb remains 7.6.  FOBT was recently negative.  Transferrin saturation was low, should get IV Fe if he has not had already. Will need to look into this.  4. Walk in halls, work with PT/cardiac rehab.   Length of Stay: 2  Marca Anconaalton Becker Christopher, MD  08/22/2017, 10:32 AM  Advanced Heart Failure Team Pager [phone] (M-F; 7a - 4p)  Please contact [facility] for night-coverage after hours (4p -7a ) and weekends on [email or web address]

## 2017-08-22 NOTE — Evaluation (Signed)
Physical Therapy Evaluation Patient Details Name: Reginald [name] MRN: [id number] DOB: 03/26/1961 Today's Date: 08/22/2017   History of Present Illness  Jaimere Hazelwoodis a 57 y.o.malewith medical history significant ofosteomyelitis of the spine, alcohol abuse, type 2 diabetes, paraspinal abscess, tobacco abuse, nonobstructive CAD, mitral regurgitation whohad very complicated admissionfrom 07/25/2017 to 08/10/2017 due to mitral valve large vegetation/endocarditis, possible aortic valve endocarditis, septic emboli to the brain, multifocal pneumonia, C1 osteomyelitis, C4 discitis and paraspinal abscess due to MRSA bacteremia.Admitted back to hospital from CIR 08/20/17 with resp distress and edema.  Plan for MVR 08/29/17.   Clinical Impression  Pt admitted with above diagnosis. Pt currently with functional limitations due to the deficits listed below (see PT Problem List). Pt was able to ambulate with RW 75 feet with min assist and with 2LO2 with sats >90%.  Pt should progress well and be ready for surgery end of week.  Will follow acutely. Pt will benefit from skilled PT to increase their independence and safety with mobility to allow discharge to the venue listed below.      Follow Up Recommendations Home health PT;Supervision/Assistance - 24 hour    Equipment Recommendations  Rolling walker with 5" wheels    Recommendations for Other Services OT consult     Precautions / Restrictions Precautions Precautions: Fall Precaution Comments: debility, watch O2 sats Restrictions Weight Bearing Restrictions: No      Mobility  Bed Mobility Overal bed mobility: Needs Assistance Bed Mobility: Supine to Sit     Supine to sit: Min assist     General bed mobility comments: Min assist to elevate trunk into sitting edge of bed.    Transfers Overall transfer level: Needs assistance Equipment used: Rolling walker (2 wheeled) Transfers: Sit to/from Stand Sit to Stand: Min guard          General transfer comment: verbal cues for hand placement.  Min A to steady.     Ambulation/Gait Ambulation/Gait assistance: Min assist;+2 safety/equipment Ambulation Distance (Feet): 75 Feet Assistive device: Rolling walker (2 wheeled) Gait Pattern/deviations: Step-through pattern;Decreased step length - right;Decreased step length - left;Shuffle;Trunk flexed Gait velocity: decr Gait velocity interpretation: Below normal speed for age/gender General Gait Details: Cues for forward gaze, upper trunk control and sequencing and increasing stride length.  Pt on 2L O2 with O2 sats 91%-94%.  Cues for  pursed lip breathing.    Stairs            Wheelchair Mobility    Modified Rankin (Stroke Patients Only)       Balance Overall balance assessment: Needs assistance Sitting-balance support: Feet supported;No upper extremity supported Sitting balance-Leahy Scale: Good     Standing balance support: Bilateral upper extremity supported;During functional activity Standing balance-Leahy Scale: Poor Standing balance comment: Pt needs RW and external support for balance                             Pertinent Vitals/Pain Pain Assessment: No/denies pain    Home Living Family/patient expects to be discharged to:: Private residence Living Arrangements: Alone Available Help at Discharge: Family;Friend(s) Type of Home: Mobile home Home Access: Stairs to enter Entrance Stairs-Rails: None(can put in rails if needed) Entrance Stairs-Number of Steps: 2 Home Layout: One level Home Equipment: None      Prior Function Level of Independence: Independent               Hand Dominance   Dominant Hand: Right  Extremity/Trunk Assessment   Upper Extremity Assessment Upper Extremity Assessment: Defer to OT evaluation    Lower Extremity Assessment Lower Extremity Assessment: Overall WFL for tasks assessed    Cervical / Trunk Assessment Cervical / Trunk Assessment:  Normal  Communication   Communication: No difficulties  Cognition Arousal/Alertness: Awake/alert Behavior During Therapy: WFL for tasks assessed/performed Overall Cognitive Status: Impaired/Different from baseline Area of Impairment: Attention;Safety/judgement;Following commands;Problem solving                   Current Attention Level: Selective Memory: Decreased short-term memory Following Commands: Follows one step commands consistently;Follows one step commands with increased time Safety/Judgement: Decreased awareness of safety;Decreased awareness of deficits   Problem Solving: Slow processing;Difficulty sequencing;Requires verbal cues;Requires tactile cues        General Comments      Exercises General Exercises - Lower Extremity Ankle Circles/Pumps: AROM;Both;10 reps;Supine Long Arc Quad: AROM;Both;10 reps;Seated   Assessment/Plan    PT Assessment Patient needs continued PT services  PT Problem List Decreased strength;Decreased activity tolerance;Decreased balance;Decreased mobility;Decreased cognition;Decreased knowledge of use of DME;Decreased safety awareness;Obesity       PT Treatment Interventions DME instruction;Gait training;Functional mobility training;Therapeutic activities;Therapeutic exercise;Balance training;Neuromuscular re-education;Cognitive remediation;Patient/family education;Stair training    PT Goals (Current goals can be found in the Care Plan section)  Acute Rehab PT Goals Patient Stated Goal: go home PT Goal Formulation: With patient Time For Goal Achievement: 09/05/17 Potential to Achieve Goals: Good    Frequency Min 3X/week   Barriers to discharge        Co-evaluation               AM-PAC PT "6 Clicks" Daily Activity  Outcome Measure Difficulty turning over in bed (including adjusting bedclothes, sheets and blankets)?: None Difficulty moving from lying on back to sitting on the side of the bed? : A Lot Difficulty sitting  down on and standing up from a chair with arms (e.g., wheelchair, bedside commode, etc,.)?: A Lot Help needed moving to and from a bed to chair (including a wheelchair)?: A Lot Help needed walking in hospital room?: A Little Help needed climbing 3-5 steps with a railing? : Total 6 Click Score: 14    End of Session Equipment Utilized During Treatment: Gait belt;Oxygen Activity Tolerance: Patient limited by fatigue Patient left: in chair;with call bell/phone within reach;with chair alarm set Nurse Communication: Mobility status PT Visit Diagnosis: Unsteadiness on feet (R26.81);Other abnormalities of gait and mobility (R26.89);Muscle weakness (generalized) (M62.81)    Time: 1610-9604 PT Time Calculation (min) (ACUTE ONLY): 23 min   Charges:   PT Evaluation $PT Eval Moderate Complexity: 1 Mod PT Treatments $Gait Training: 8-22 mins   PT G Codes:        Kannan Proia,PT Acute Rehabilitation [phone] (pager)   Berline Lopes 08/22/2017, 4:04 PM

## 2017-08-22 NOTE — Progress Notes (Signed)
PROGRESS NOTE  Reginald [name] UEA:[id number] DOB: 20-Dec-1960 DOA: 08/20/2017 PCP: Patient, No Pcp Per   Brief summary:  He was hospitalized from  12/4 to 12/20 mrsa endocarditis , he was discharged to rehab on iv abx ( ceftaroline and daptomycin) in anticipating cardiac thoracic surgery once he regain some strength. He is sent back to the hospital due to hypoxia, chf exacerbation  He reports has noticed weight gain and increased lower extremity edema for the last 2-3 days while he was at inpatient rehab center  HPI/Recap of past 24 hours:  Urine output  6liter last 24hrs, 10 liter since admission Feeling much better, denies chest pain, no sob at rest, no cough, no fever, edema has almost resolved  Wife at bedside   Assessment/Plan: Principal Problem:   Acute respiratory failure (HCC) Active Problems:   MRSA bacteremia   Mitral valve vegetation   Acute diastolic CHF (congestive heart failure) (HCC)   Endocarditis of mitral valve   Cerebral septic emboli (HCC)   Hyponatremia   Essential hypertension   Type 2 diabetes mellitus with peripheral neuropathy (HCC)  Acute respiratory failure (HCC) -Likely secondary to CHF exacerbation in the setting of severe mitral valve regurgiation -He is sent from inpatient rehab center to hospital and admitted to stepdown, he is started on iv lasix/metolazone and put on bipap,  -much improved, continue lasix, d/c metolazone d/ed, change bipap to prn -heart failure team consulted, meds per heart failure team.  Bilateral lower extremity edema; Left > right Venous doppler left leg pending Dg x ray left foot "Fracture proximal aspect of fifth proximal phalanx, stable from 1 week prior. Minimal healing response in this area. No other fracture. No dislocation"  MRSA bacteremia  MV endocarditis, large vegetation Possible AV endocarditis CNS, septic emboli to brain Possible multifocal pneumonia on CT C1 vertebral osteo, C4 discitis,  paraspinal abscess -He was hospitalized from  12/4 to 12/20 for this , he was discharged to rehab on iv abx ( ceftaroline and daptomycin) in anticipating cardiac thoracic surgery once he regain some strength -blood culture has been negative since 12/13 -Per last infectious disease note on 12/18 by Dr Zenaida Niece dam:  "I would send valve tissue for culture from OR.  I would plan on giving him 6 weeks of postoperative IV antibiotics This could be with daptomycin alone potentially" Monitor ck weekly. Continue ceftaroline/daptomycin -thoracic surgery consulted, will follow recommendation.  Vertebral osteomyelitis This was noted on MRI C-spine ordered by ID.  There was osteomyelitis of the odontoid process.  Case was discussed with Neurosurgery who reviewed films during last hospitalization, recommended repeat MRI C-spine in 4-6 weeks (Jan 10-20th roughly). -However, in light of neck hyperextension for anesthesia, would recommend repeat MRI imaging before surgery  HTN: continue lopressor and lisinopril, lasix  Diabetes: He was started on insulin during last hospitalization, prior to that he was not on insulin a1c 5.8 ( may not be reliable due to anemia, hgb 7.6) Continue insulin  Normocytic Anemia: No sign of bleeding Transfuse prbc x1 on 12/31 Monitor hgb.  H/o alcohol use, has alcohol delirum needing precedex drip and intubation during last hospitalization. Should not be an issue now , since he has been at rehab since last  Discharge on 12/20.   Code Status: full  Family Communication: patient and wife  Disposition Plan: remain in stepdown   Consultants:  CT surgery  Heart failure team  Procedures:  bipap  Antibiotics:  ceftaroline  daptomycin   Objective: BP 124/76 (BP  Location: Left Arm)   Pulse 85   Temp 99.2 F (37.3 C) (Oral)   Resp 20   Ht 5\' 9"  (1.753 m)   Wt 105 kg (231 lb 8 oz)   SpO2 98%   BMI 34.19 kg/m   Intake/Output Summary (Last 24 hours) at  08/22/2017 0806 Last data filed at 08/21/2017 2156 Gross per 24 hour  Intake 1592.5 ml  Output 5975 ml  Net -4382.5 ml   Filed Weights   08/20/17 0424 08/21/17 0321 08/22/17 0402  Weight: 113.4 kg (250 lb) 110.6 kg (243 lb 13.3 oz) 105 kg (231 lb 8 oz)    Exam: Patient is examined daily including today on 08/22/2017, exams remain the same as of yesterday except that has changed    General:  NAD  Cardiovascular: RRR,   Respiratory: mild crackles at basis, no wheezing, no rhonchi  Abdomen: Soft/ND/NT, positive BS, well healed old midline surgical scar ( from prior hernia repair)  Musculoskeletal: bilateral lower extremity pitting Edema has much improved, right lower extremity edema almost resolved, left lower extremity less edematous  Neuro: alert, oriented   Data Reviewed: Basic Metabolic Panel: Recent Labs  Lab 08/16/17 0602 08/19/17 2300 08/21/17 0510 08/22/17 0211  NA 129* 130* 131* 130*  K 4.1 4.9 2.8* 3.7  CL 90* 97* 90* 86*  CO2 28 22 32 34*  GLUCOSE 174* 182* 169* 207*  BUN 9 17 18 18   CREATININE 0.74 0.86 0.88 0.91  CALCIUM 8.3* 8.4* 8.5* 8.6*  MG  --   --  1.6* 2.1   Liver Function Tests: Recent Labs  Lab 08/16/17 0602  AST 26  ALT 30  ALKPHOS 203*  BILITOT 1.1  PROT 7.9  ALBUMIN 2.0*   No results for input(s): LIPASE, AMYLASE in the last 168 hours. No results for input(s): AMMONIA in the last 168 hours. CBC: Recent Labs  Lab 08/16/17 0602 08/20/17 0326 08/21/17 0510  WBC 8.0 10.5 7.2  NEUTROABS  --  8.2*  --   HGB 8.2* 7.8* 7.6*  HCT 25.6* 25.0* 24.1*  MCV 89.8 90.6 88.9  PLT 284 262 252   Cardiac Enzymes:   Recent Labs  Lab 08/21/17 0510  CKTOTAL 36*   BNP (last 3 results) Recent Labs    07/26/17 0340 08/19/17 2300  BNP 104.6* 204.8*    ProBNP (last 3 results) No results for input(s): PROBNP in the last 8760 hours.  CBG: Recent Labs  Lab 08/20/17 1144 08/20/17 1651 08/20/17 2104 08/21/17 0551 08/21/17 1139  GLUCAP  199* 201* 152* 172* 233*    Recent Results (from the past 240 hour(s))  Culture, blood (Routine X 2) w Reflex to ID Panel     Status: None   Collection Time: 08/16/17  6:00 AM  Result Value Ref Range Status   Specimen Description BLOOD LEFT ANTECUBITAL  Final   Special Requests   Final    BOTTLES DRAWN AEROBIC AND ANAEROBIC Blood Culture adequate volume   Culture NO GROWTH 5 DAYS  Final   Report Status 08/21/2017 FINAL  Final  Culture, blood (Routine X 2) w Reflex to ID Panel     Status: None   Collection Time: 08/16/17  2:55 PM  Result Value Ref Range Status   Specimen Description BLOOD LEFT ANTECUBITAL  Final   Special Requests   Final    BOTTLES DRAWN AEROBIC ONLY Blood Culture adequate volume   Culture NO GROWTH 5 DAYS  Final   Report Status 08/21/2017 FINAL  Final     Studies: Dg Foot Complete Left  Result Date: 08/21/2017 CLINICAL DATA:  Pain and swelling EXAM: LEFT FOOT - COMPLETE 3+ VIEW COMPARISON:  August 13, 2017 FINDINGS: Frontal, oblique, and lateral views were obtained. There is again noted a fracture of the proximal metaphysis of the fifth proximal phalanx with alignment near anatomic. There is minimal healing response in this area. No other fracture evident. No dislocation. There is narrowing of the first MTP joint. Other joint spaces appear unremarkable. There is pes cavus. There are spurs arising from the posterior and inferior calcaneus. IMPRESSION: Fracture proximal aspect of fifth proximal phalanx, stable from 1 week prior. Minimal healing response in this area. No other fracture. No dislocation. There are calcaneal spurs. There is pes cavus. There is narrowing of the first MTP joint. Electronically Signed   By: Bretta BangWilliam  Woodruff III M.D.   On: 08/21/2017 13:20    Scheduled Meds: . budesonide  0.25 mg Nebulization BID  . enoxaparin (LOVENOX) injection  40 mg Subcutaneous Q24H  . feeding supplement (GLUCERNA SHAKE)  237 mL Oral TID WC  . ferrous  fumarate-b12-vitamic C-folic acid  1 capsule Oral TID PC  . furosemide  60 mg Intravenous BID  . insulin aspart  0-15 Units Subcutaneous TID WC  . insulin glargine  14 Units Subcutaneous Daily  . lidocaine  1 patch Transdermal Q24H  . lisinopril  2.5 mg Oral Daily  . metoprolol tartrate  25 mg Oral BID  . potassium chloride  20 mEq Oral BID    Continuous Infusions: . sodium chloride    . ceFTAROline (TEFLARO) IV Stopped (08/21/17 2156)  . DAPTOmycin (CUBICIN)  IV 1,000 mg (08/21/17 1820)     Time spent: 35 mins  I have personally reviewed and interpreted on  08/22/2017 daily labs, tele strips, imagings as discussed above under date review session and assessment and plans.  I reviewed all nursing notes, pharmacy notes, consultant notes,  vitals, pertinent old records  I have discussed plan of care as described above with RN , patient and family on 08/22/2017   Albertine GratesFang Brooke Steinhilber MD, PhD  [facility] Pager [phone]. If 7PM-7AM, please contact night-coverage at [email or web address], password [email or web address] 08/22/2017, 8:06 AM  LOS: 2 days

## 2017-08-23 ENCOUNTER — Inpatient Hospital Stay (HOSPITAL_COMMUNITY): Payer: Medicaid Other

## 2017-08-23 ENCOUNTER — Inpatient Hospital Stay (HOSPITAL_COMMUNITY): Payer: Self-pay

## 2017-08-23 DIAGNOSIS — R609 Edema, unspecified: Secondary | ICD-10-CM

## 2017-08-23 LAB — PULMONARY FUNCTION TEST
FEF 25-75 Pre: 1.47 L/sec
FEF2575-%Pred-Pre: 48 %
FEV1-%Pred-Pre: 41 %
FEV1-Pre: 1.49 L
FEV1FVC-%Pred-Pre: 106 %
FEV6-%Pred-Pre: 40 %
FEV6-Pre: 1.84 L
FEV6FVC-%Pred-Pre: 104 %
FVC-%Pred-Pre: 39 %
FVC-Pre: 1.84 L
Pre FEV1/FVC ratio: 81 %
Pre FEV6/FVC Ratio: 100 %

## 2017-08-23 LAB — BASIC METABOLIC PANEL
ANION GAP: 9 (ref 5–15)
BUN: 18 mg/dL (ref 6–20)
CALCIUM: 8.4 mg/dL — AB (ref 8.9–10.3)
CO2: 32 mmol/L (ref 22–32)
Chloride: 88 mmol/L — ABNORMAL LOW (ref 101–111)
Creatinine, Ser: 0.85 mg/dL (ref 0.61–1.24)
GFR calc Af Amer: >60 mL/min (ref >60)
GFR calc non Af Amer: >60 mL/min (ref >60)
GLUCOSE: 153 mg/dL — AB (ref 65–99)
Potassium: 3.8 mmol/L (ref 3.5–5.1)
Sodium: 129 mmol/L — ABNORMAL LOW (ref 135–145)

## 2017-08-23 LAB — GLUCOSE, CAPILLARY
GLUCOSE-CAPILLARY: 147 mg/dL — AB (ref 65–99)
GLUCOSE-CAPILLARY: 155 mg/dL — AB (ref 65–99)
Glucose-Capillary: 138 mg/dL — ABNORMAL HIGH (ref 65–99)
Glucose-Capillary: 153 mg/dL — ABNORMAL HIGH (ref 65–99)

## 2017-08-23 MED ORDER — FUROSEMIDE 80 MG PO TABS
80.0000 mg | ORAL_TABLET | Freq: Every day | ORAL | Status: DC
Start: 2017-08-23 — End: 2017-08-29
  Administered 2017-08-23 – 2017-08-28 (×6): 80 mg via ORAL
  Filled 2017-08-23 (×6): qty 1

## 2017-08-23 MED ORDER — ENOXAPARIN SODIUM 120 MG/0.8ML ~~LOC~~ SOLN
105.0000 mg | Freq: Two times a day (BID) | SUBCUTANEOUS | Status: DC
  Administered 2017-08-23 – 2017-08-24 (×2): 105 mg via SUBCUTANEOUS
  Filled 2017-08-23 (×3): qty 0.7

## 2017-08-23 MED ORDER — IOPAMIDOL (ISOVUE-370) INJECTION 76%
INTRAVENOUS | Status: AC
  Administered 2017-08-23: 100 mL
  Filled 2017-08-23: qty 100

## 2017-08-23 MED ORDER — SPIRONOLACTONE 12.5 MG HALF TABLET
12.5000 mg | ORAL_TABLET | Freq: Every day | ORAL | Status: DC
  Administered 2017-08-23 – 2017-08-28 (×6): 12.5 mg via ORAL
  Filled 2017-08-23 (×8): qty 1

## 2017-08-23 MED ORDER — CHLORHEXIDINE GLUCONATE 0.12 % MT SOLN
15.0000 mL | Freq: Two times a day (BID) | OROMUCOSAL | Status: DC
  Administered 2017-08-23 – 2017-08-28 (×10): 15 mL via OROMUCOSAL
  Filled 2017-08-23 (×12): qty 15

## 2017-08-23 NOTE — Progress Notes (Signed)
ANTICOAGULATION CONSULT NOTE - Initial Consult  Pharmacy Consult:  Lovenox Indication:  Acute DVT  Allergies  Allergen Reactions  . Morphine And Related Other (See Comments)    "freaks out"    Patient Measurements: Height: 5\' 9"  (175.3 cm) Weight: 231 lb 11.2 oz (105.1 kg) IBW/kg (Calculated) : 70.7  Vital Signs: Temp: 98.9 F (37.2 C) (01/02 1200) Temp Source: Oral (01/02 1200) BP: 104/65 (01/02 1200) Pulse Rate: 91 (01/02 1200)  Labs: Recent Labs    08/21/17 0510 08/22/17 0211 08/23/17 0314  HGB 7.6*  --   --   HCT 24.1*  --   --   PLT 252  --   --   CREATININE 0.88 0.91 0.85  CKTOTAL 36*  --   --     Estimated Creatinine Clearance: 114.6 mL/min (by C-G formula based on SCr of 0.85 mg/dL).   Medical History: Past Medical History:  Diagnosis Date  . Acute osteomyelitis of cervical spine (HCC) 08/10/2017  . Alcohol abuse   . CAD (coronary artery disease)   . Diabetes mellitus type 2 in obese (HCC)   . Mitral regurgitation   . Neck pain   . 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 Tobacco abuse        Assessment: [age] YOM with acute DVT to start full-dose Lovenox.  Patient has been getting prophylactic Lovenox and his last dose was today at 1124.  Patient's renal function is stable.  His hemoglobin/hematocrit are low, but no overt bleeding documented.   Goal of Therapy:  Anti-Xa level 0.6-1 units/ml 4hrs after LMWH dose given Monitor platelets by anticoagulation protocol: Yes    Plan:  Lovenox 105mg  SQ Q12H, start now CBC Q72H while on Lovenox   Jeron Grahn D. Laney Potashang, PharmD, BCPS Pager:  [phone] 08/23/2017, 5:37 PM

## 2017-08-23 NOTE — Progress Notes (Signed)
Pharmacy Antibiotic Note  Reginald [name] is a 57 y.o. male admitted on 08/20/2017 from rehab unit to PCU with MRSA bacteremia and endocarditis.  Pharmacy has been consulted to continue Cubicin dosing.  Patient was started on ceftaroline on 12/30 upon readmission. Upon discussion with ID, ceftaroline was stopped on 12/21 after completion of treatment for pneumonia and growth on cultures for 5 days. WBC continues to remain within normal limits. Afebrile. Total CK was 36 on 12/31.   Plan: Continue daptomycin 1gm (~10mg /kg) IV q24h Per ID, complete 6 weeks total abx (last day 09/10/17) Discontinue ceftaroline 600 mg IV q 12h F/u renal fxn, repeat BCx, CK (next 1/7)  Height: 5\' 9"  (175.3 cm) Weight: 231 lb 11.2 oz (105.1 kg) IBW/kg (Calculated) : 70.7  Temp (24hrs), Avg:98.6 F (37 C), Min:98.1 F (36.7 C), Max:99.2 F (37.3 C)  Recent Labs  Lab 08/19/17 2300 08/20/17 0326 08/21/17 0510 08/22/17 0211 08/23/17 0314  WBC  --  10.5 7.2  --   --   CREATININE 0.86  --  0.88 0.91 0.85    Estimated Creatinine Clearance: 114.6 mL/min (by C-G formula based on SCr of 0.85 mg/dL).    Allergies  Allergen Reactions  . Morphine And Related Other (See Comments)    "freaks out"    [facility]: [facility] >> 12/10 ceftaroline 12/10 >> 12/21 rifampin 12/12 >> 12/18 daptomycin 12/10 >>  Dose adjustments: 12/7 1800 Vancomycin peak level: 42 12/8 0230 Vancomycin trough level: 38 on Vanc 1500mg  q12h, vanc held 12/9 0230 Vancomycin random level: 21 , dose decreased to 1750mg  q24h  Microbiology results: 11/29, 12/1 and 12/3 [facility] BCx: MRSA (S: bactrim, vanc, dapto, linezolid, TCN) -per Dr. Feliz BeamSnider's note  12/4 BCx: MRSA - Vanc MIC < 0.5 12/5 HIV Ab: negative 12/6 MRSA PCR: positive 12/7 BCx: 2/2 SA 12/10 BCx: MRSA 12/13 BCx: Neg 12/26 BCx: NG x2 days  Thank you for allowing pharmacy to be a part of this patient's care.  Girard CooterKimberly Perkins,  PharmD Clinical Pharmacist  Pager: [phone] Clinical Phone for 08/23/2016 until 3:30pm: [phone] If after 3:30pm, please call main pharmacy at [phone]  08/23/2017 10:30 AM

## 2017-08-23 NOTE — Progress Notes (Signed)
Left lower extremity venous duplex has been completed. There is evidence of acute deep vein thrombosis involving the posterior tibial veins of the left lower extremity. Results were given to the patient's nurse, Marchelle FolksAmanda.  08/23/17 3:24 PM Olen CordialGreg Kemo Spruce RVT

## 2017-08-23 NOTE — Progress Notes (Signed)
PROGRESS NOTE:  08/23/2017 Reginald [name] [id number]  VITALS: BP 121/81 (BP Location: Left Arm)   Pulse 86   Temp 99 F (37.2 C) (Oral)   Resp (!) 25   Ht 5\' 9"  (1.753 m)   Wt 231 lb 11.2 oz (105.1 kg)   SpO2 97%   BMI 34.22 kg/m   LABS:  Lab Results  Component Value Date   WBC 7.2 08/21/2017   HGB 7.6 (L) 08/21/2017   HCT 24.1 (L) 08/21/2017   MCV 88.9 08/21/2017   PLT 252 08/21/2017   BMET    Component Value Date/Time   NA 129 (L) 08/23/2017 0314   K 3.8 08/23/2017 0314   CL 88 (L) 08/23/2017 0314   CO2 32 08/23/2017 0314   GLUCOSE 153 (H) 08/23/2017 0314   BUN 18 08/23/2017 0314   CREATININE 0.85 08/23/2017 0314   CALCIUM 8.4 (L) 08/23/2017 0314   GFRNONAA >60 08/23/2017 0314   GFRAA >60 08/23/2017 0314    Lab Results  Component Value Date   INR 1.25 08/02/2017   INR 1.14 07/30/2017   No results found for: PTT   Reginald [name] is status post extraction of remaining teeth with alveoloplasty in the operating room on 08/07/2017.  SUBJECTIVE: Patient with minimal complaints from dental extraction sites.  Patient denies having any stitches that remain.  EXAM: There is no sign of infection, heme, or ooze.  The patient is now edentulous.  No obvious retained sutures are noted.  ASSESSMENT: Post operative course is consistent with dental procedures performed in the operating room on 08/07/2017 Loss of teeth due to extraction Patient is now edentulous There is atrophy of the edentulous alveolar ridges  PLAN: 1.  Use chlorhexidine rinses twice daily after breakfast and at bedtime. 2.  Patient is cleared for heart valve surgery Dr. Donata ClayVan Trigt as per his discretion. 3.  Follow-up with a dentist of his choice for fabrication of upper and lower complete dentures after adequate healing from the dental extractions and once medically stable from the anticipated heart valve surgery.   Charlynne Panderonald F. Herve Haug, DDS

## 2017-08-23 NOTE — Progress Notes (Signed)
CARDIAC REHAB PHASE I   PRE:  Rate/Rhythm: 84 SR   BP:  Supine: 113/78  Sitting:   Standing:    SaO2: 97% 3L  MODE:  Ambulation: 150 ft   POST:  Rate/Rhythm: 97 SR  BP:  Supine:   Sitting: 107/74  Standing:    SaO2: 96% 2L 1000-1042 Pt walked 150 ft on 2L with gait belt use, rolling walker and asst x 2 with fairly steady gait. Tolerated well as he did not need to sit to rest. To recliner after walk. Left on 2L. Call bell in reach. Encouraged IS.   Luetta Nuttingharlene Donnell Beauchamp, RN BSN  08/23/2017 10:36 AM

## 2017-08-23 NOTE — Discharge Summary (Signed)
NAMEKANNON, GRANDERSON           ACCOUNT NO.:  [id number]  MEDICAL RECORD NO.:  [id number]  LOCATION:                                 FACILITY:  PHYSICIAN:  Erick Colace, M.D.DATE OF BIRTH:  10/28/1960  DATE OF ADMISSION:  08/10/2017 DATE OF DISCHARGE:  08/20/2017                              DISCHARGE SUMMARY   DISCHARGE DIAGNOSES: 1. Debilitation secondary to septic emboli to the brain, spine, and     muscles. 2. Sequential compression devices for deep vein thrombosis     prophylaxis. 3. Respiratory distress. 4. Chronic pain syndrome. 5. 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 6. Mitral regurgitation. 7. Multiple dental extractions. 8. Diabetes mellitus, peripheral neuropathy. 9. Diastolic congestive heart failure. 10.Chronic obstructive pulmonary disease with tobacco abuse. 11.Alcohol abuse. 12.Hypertension. 13.Methicillin-resistant Staphylococcus aureus PCR screening positive. 14.Hyponatremia. 15.Acute blood loss anemia. 16.Insomnia.  This is a 57 year old right-handed male, admitted to [facility] with hypoxia, found to have a MRSA bacteremia, endocarditis.  Hospital course significant for acute on chronic congestive heart failure, alcohol withdrawal, confusion, agitation.  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  He was transferred to [facility].  MRI completed revealing multiple small areas of restricted diffusion bilaterally, most compatible with septic emboli.  Infectious Disease consulted.  TEE showed large mass on anterior mitral leaflet with central regurgitation, suggestive for possible perforation as well as possible small mass on AV.  Could be due to resolved endocarditis.  Issues of agitation, requiring Precedex. Developed obtundation requiring intubation, airway protection; later extubated.  Repeat blood  cultures positive.  CT of abdomen and chest showed splenic emboli, possible pneumonia.  Broad-spectrum antibiotics initiated per Infectious Disease.  Neurology consulted for input.  No aspirin needed at this time.  CTA of the head was negative for large or medium vessel stenosis.  Cardiology consulted for CHF management. Underwent diagnostic cardiac catheterization on August 01, 2017.  MRSA PCR screening positive with contact precautions.  Underwent multiple dental extractions of necrotic teeth on August 07, 2017, in preparation for mitral valve replacement with bioprosthetic valve which had initially been scheduled for August 14, 2017, followed by Cardiothoracic Surgery.  Followup Infectious Disease, ongoing use of daptomycin for 6 weeks.  Latest blood cultures, no growth.  The patient was admitted for a comprehensive rehab program.  PAST MEDICAL HISTORY:  See discharge diagnoses.  SOCIAL HISTORY:  Lives with family, independent prior to admission.  FUNCTIONAL STATUS UPON ADMISSION TO REHAB SERVICES:  Minimal guard 10 feet rolling walker, minimal assist sit to stand, min to mod assist activities of daily living.  PHYSICAL EXAMINATION:  VITAL SIGNS:  Blood pressure 115/69, pulse 93, temperature 99, respirations 18. GENERAL:  Alert male.  Speech was slow.  He was able to process, answered orientation questions with minimal cues, able to follow commands. NECK:  Supple.  Nontender.  No JVD. CARDIAC:  Rate controlled. ABDOMEN:  Soft, nontender.  Good bowel sounds. LUNGS:  Clear to auscultation without wheeze.  REHABILITATION HOSPITAL COURSE:  The patient was admitted to Inpatient Rehab Services with therapies initiated on a 3-hour daily  basis, consisting of physical therapy, occupational therapy, speech therapy, and rehabilitation nursing.  The following issues were addressed during the patient's rehabilitation stay.  Pertaining to Mr. Yvonna AlanisHazelwood's debilitation related to septic  emboli, he continue with ongoing antibiotic therapy 6 weeks, followed by Infectious Disease.  Followed by Cardiothoracic Surgery for mitral regurgitation with plan for mitral valve replacement, scheduled for the week of August 29, 2017.  He had undergone multiple dental extractions in preparation for his mitral valve replacement.  Diabetes mellitus, monitored, on Lantus insulin 14 units daily.  He exhibited no other signs of fluid overload.  He remained on Lasix therapy and followed closely for hyponatremia.  He did have a history of COPD, tobacco abuse as well as alcohol use; monitoring for any signs of withdrawal.  Blood pressures controlled and monitored, on Lopressor.  Acute on chronic anemia, hemoglobin 8.2 and stable, negative stool guaiac.  Insomnia, felt to be multifactorial in nature. The patient received weekly collaborative interdisciplinary team conferences to discuss estimated length of stay, family teaching, any barriers to discharge.  He was ambulating 60 feet rolling walker, supervision, minimal cues.  Working with strength and endurance in preparation for mitral valve replacement.  He could gather his belongings for activities of daily living and homemaking.  On the day of August 20, 2017, the patient with increasing shortness of breath, altered mental status, tachycardia.  Chest x-ray completed showing increased interstitial and alveolar pulmonary edema, increased small pleural effusion.  Sodium 130, BUN 17, creatinine 0.86, hemoglobin 7.8, WBC 10,500.  He was discharged to Acute Care Services for acute respiratory failure, likely secondary to diastolic and CHF exacerbation, placed on BiPAP, scheduled bronchodilators.  He continued on intravenous Lasix as advised.  All medication changes made as per medical team.     Mariam Dollaraniel Angiulli, P.A.   ______________________________ Erick ColaceAndrew E. Selvin Yun, M.D.    DA/MEDQ  D:  08/21/2017  T:  08/22/2017  Job:  [id number]  cc:    Erick ColaceAndrew E. Gracie Gupta, M.D. Kerin PernaPeter Van Trigt, M.D.

## 2017-08-23 NOTE — Care Management Note (Addendum)
Case Management Note  Patient Details  Name: Reginald [name] MRN: [id number] Date of Birth: 07/29/1961  Subjective/Objective:  From home with his girlfriend, pta indep, presents with Acute on chronic diastolic CHF with severe MR, he has MV endocarditis. He has no insurance,  He is for surgery next Tuesday.  Per MD with HF, patient can go home and come back for surgery.  Patient states his sister will be with him 24/7 til he comes back for surgery.  He will most likely need HHRN  (charity) and rolling walker.  NCM [facility] at [facility] pm , he can also utilize the CHW clinic for medication assist. Per attending they are adjusting his medications today for renal function and he is to get MRI, so he may possibly dc tomorrow. [name], she state they do not service his area. NCM spoke with Reginald [name] she states we would probably need [facility]LOG[facility] if he Is going to need a [facility]HR[facility].  NCM informed Pam not sure yet of the plan, will wait to see what MD says.                       Action/Plan: NCM will follow for dc needs.   Expected Discharge Date:                  Expected Discharge Plan:     In-House Referral:     Discharge planning Services  CM Consult  Post Acute Care Choice:    Choice offered to:     DME Arranged:    DME Agency:     HH Arranged:    HH Agency:     Status of Service:  In process, will continue to follow  If discussed at Long Length of Stay Meetings, dates discussed:    Additional Comments:  Reginald [name], Reginald Polinsky [place], RN 08/23/2017, 10:45 AM

## 2017-08-23 NOTE — Progress Notes (Addendum)
PROGRESS NOTE  Reginald [name] WUJ:[id number] DOB: 05-20-1961 DOA: 08/20/2017 PCP: Patient, No Pcp Per   Brief summary:  He was hospitalized from  12/4 to 12/20 mrsa endocarditis , he was discharged to rehab on iv abx ( ceftaroline and daptomycin) in anticipating cardiac thoracic surgery once he regain some strength. He is sent back to the hospital due to hypoxia, chf exacerbation  He reports has noticed weight gain and increased lower extremity edema for the last 2-3 days while he was at inpatient rehab center  HPI/Recap of past 24 hours:  Urine output  3.8liter last 24hrs, 13.2 liter since admission Feeling much better, denies chest pain, no sob at rest, no cough, no fever, edema has almost resolved. He does report neck pain, but able to turn his head.    Assessment/Plan: Principal Problem:   Acute respiratory failure (HCC) Active Problems:   MRSA bacteremia   Mitral valve vegetation   Acute diastolic CHF (congestive heart failure) (HCC)   Endocarditis of mitral valve   Cerebral septic emboli (HCC)   Hyponatremia   Essential hypertension   Type 2 diabetes mellitus with peripheral neuropathy (HCC)  Acute respiratory failure (HCC) -Likely secondary to CHF exacerbation in the setting of severe mitral valve regurgiation -He is sent from inpatient rehab center to hospital and admitted to stepdown, he is started on iv lasix/metolazone and put on bipap,  -much improved, continue lasix, d/c metolazone d/ed, change bipap to prn -heart failure team consulted, meds per heart failure team.  Bilateral lower extremity edema; Left > right Venous doppler left leg pending Dg x ray left foot "Fracture proximal aspect of fifth proximal phalanx, stable from 1 week prior. Minimal healing response in this area. No other fracture. No dislocation"   5pm Addendum:  -Venous doppler + acute DVT involving the posterior tibial veins of the left lower extremity. -Change lovenox from  prophylaxis dose to treatment dose, pharmacy to dose lovenox. -CTA chest ordered to rule out PE.   MRSA bacteremia  MV endocarditis, large vegetation Possible AV endocarditis CNS, septic emboli to brain Possible multifocal pneumonia on CT C1 vertebral osteo, C4 discitis, paraspinal abscess -He was hospitalized from  12/4 to 12/20 for this , he was discharged to rehab on iv abx (  daptomycin) in anticipating cardiac thoracic surgery once he regain some strength. -blood culture has been negative since 12/13 -Per last infectious disease note on 12/20 by Dr Zenaida Niece dam:  "I am going to simplify thim to DAPTOMYCIN Continue this through surgery and then 6 weeks postoperatively I would send valve tissue for culture from OR  Cervical spine osteo and paraspinal abscess I WOULD MAKE SURE REPEAT MRI DONE PRIOR TO CT SURGERY, he will also need to have another one prior to stopping his IV antibiotics"  -Monitor ck weekly. -Continue daptomycin for now -thoracic surgery consulted, will follow recommendation.  Vertebral osteomyelitis This was noted on MRI C-spine ordered by ID.  There was osteomyelitis of the odontoid process.  Case was discussed with Neurosurgery who reviewed films during last hospitalization, recommended repeat MRI C-spine in 4-6 weeks (Jan 10-20th roughly). -However, in light of neck hyperextension for anesthesia, would recommend repeat MRI imaging before surgery -mri cervical spine ordered on 1/2   HTN: continue lopressor and lisinopril, lasix  Diabetes: He was started on insulin during last hospitalization, prior to that he was not on insulin a1c 5.8 ( may not be reliable due to anemia, hgb 7.6) Continue insulin  Normocytic Anemia: No sign  of bleeding Transfuse prbc x1 on 12/31 Monitor hgb.  H/o alcohol use, has alcohol delirum needing precedex drip and intubation during last hospitalization. Should not be an issue now , since he has been at rehab since last   Discharge on 12/20.   Code Status: full  Family Communication: patient   Disposition Plan: hopefully d/c on 1/3 with cardiology and Ct surgery clearance   Consultants:  CT surgery  Heart failure team  Procedures:  bipap  Antibiotics:  ceftaroline  From admission to 1/2  daptomycin   Objective: BP 121/81 (BP Location: Left Arm)   Pulse 86   Temp 99 F (37.2 C) (Oral)   Resp (!) 25   Ht 5\' 9"  (1.753 m)   Wt 105.1 kg (231 lb 11.2 oz)   SpO2 97%   BMI 34.22 kg/m   Intake/Output Summary (Last 24 hours) at 08/23/2017 0757 Last data filed at 08/23/2017 0308 Gross per 24 hour  Intake 890 ml  Output 3875 ml  Net -2985 ml   Filed Weights   08/21/17 0321 08/22/17 0402 08/23/17 0308  Weight: 110.6 kg (243 lb 13.3 oz) 105 kg (231 lb 8 oz) 105.1 kg (231 lb 11.2 oz)    Exam: Patient is examined daily including today on 08/23/2017, exams remain the same as of yesterday except that has changed    General:  NAD  Cardiovascular: RRR,   Respiratory: mild crackles at basis, no wheezing, no rhonchi  Abdomen: Soft/ND/NT, positive BS, well healed old midline surgical scar ( from prior hernia repair)  Musculoskeletal: bilateral lower extremity pitting Edema has much improved, right lower extremity edema almost resolved, left lower extremity less edematous  Neuro: alert, oriented   Data Reviewed: Basic Metabolic Panel: Recent Labs  Lab 08/19/17 2300 08/21/17 0510 08/22/17 0211 08/23/17 0314  NA 130* 131* 130* 129*  K 4.9 2.8* 3.7 3.8  CL 97* 90* 86* 88*  CO2 22 32 34* 32  GLUCOSE 182* 169* 207* 153*  BUN 17 18 18 18   CREATININE 0.86 0.88 0.91 0.85  CALCIUM 8.4* 8.5* 8.6* 8.4*  MG  --  1.6* 2.1  --    Liver Function Tests: No results for input(s): AST, ALT, ALKPHOS, BILITOT, PROT, ALBUMIN in the last 168 hours. No results for input(s): LIPASE, AMYLASE in the last 168 hours. No results for input(s): AMMONIA in the last 168 hours. CBC: Recent Labs  Lab  08/20/17 0326 08/21/17 0510  WBC 10.5 7.2  NEUTROABS 8.2*  --   HGB 7.8* 7.6*  HCT 25.0* 24.1*  MCV 90.6 88.9  PLT 262 252   Cardiac Enzymes:   Recent Labs  Lab 08/21/17 0510  CKTOTAL 36*   BNP (last 3 results) Recent Labs    07/26/17 0340 08/19/17 2300  BNP 104.6* 204.8*    ProBNP (last 3 results) No results for input(s): PROBNP in the last 8760 hours.  CBG: Recent Labs  Lab 08/22/17 0629 08/22/17 1124 08/22/17 1627 08/22/17 2036 08/23/17 0654  GLUCAP 155* 149* 229* 166* 138*    Recent Results (from the past 240 hour(s))  Culture, blood (Routine X 2) w Reflex to ID Panel     Status: None   Collection Time: 08/16/17  6:00 AM  Result Value Ref Range Status   Specimen Description BLOOD LEFT ANTECUBITAL  Final   Special Requests   Final    BOTTLES DRAWN AEROBIC AND ANAEROBIC Blood Culture adequate volume   Culture NO GROWTH 5 DAYS  Final   Report  Status 08/21/2017 FINAL  Final  Culture, blood (Routine X 2) w Reflex to ID Panel     Status: None   Collection Time: 08/16/17  2:55 PM  Result Value Ref Range Status   Specimen Description BLOOD LEFT ANTECUBITAL  Final   Special Requests   Final    BOTTLES DRAWN AEROBIC ONLY Blood Culture adequate volume   Culture NO GROWTH 5 DAYS  Final   Report Status 08/21/2017 FINAL  Final     Studies: Dg Chest 2 View  Result Date: 08/22/2017 CLINICAL DATA:  57 year old male with a history of congestive heart failure EXAM: CHEST  2 VIEW COMPARISON:  08/19/2017, 08/16/2017 FINDINGS: Cardiomediastinal silhouette unchanged in size and contour. Interlobular septal thickening bilateral lungs with mixed linear and patchy airspace opacities. Compare to the prior there is mild improved aeration on the left. No pneumothorax. Blunting at the left costophrenic angle with meniscus on the lateral view. IMPRESSION: Mildly improved aeration of the left lung, with evidence of persisting pulmonary edema and small left pleural effusion.  Electronically Signed   By: Gilmer Mor D.O.   On: 08/22/2017 09:44    Scheduled Meds: . budesonide  0.25 mg Nebulization BID  . enoxaparin (LOVENOX) injection  40 mg Subcutaneous Q24H  . feeding supplement (GLUCERNA SHAKE)  237 mL Oral TID WC  . ferrous fumarate-b12-vitamic C-folic acid  1 capsule Oral TID PC  . insulin aspart  0-15 Units Subcutaneous TID WC  . insulin glargine  14 Units Subcutaneous Daily  . lidocaine  1 patch Transdermal Q24H  . lisinopril  5 mg Oral Daily  . metoprolol tartrate  25 mg Oral BID  . potassium chloride  20 mEq Oral BID    Continuous Infusions: . sodium chloride    . ceFTAROline (TEFLARO) IV Stopped (08/22/17 2210)  . DAPTOmycin (CUBICIN)  IV Stopped (08/22/17 1744)     Time spent: 35 mins  I have personally reviewed and interpreted on  08/23/2017 daily labs, tele strips, imagings as discussed above under date review session and assessment and plans.  I reviewed all nursing notes, pharmacy notes, consultant notes,  vitals, pertinent old records  I have discussed plan of care as described above with RN , patient and family on 08/23/2017   Albertine Grates MD, PhD  [facility] Pager [phone]. If 7PM-7AM, please contact night-coverage at [email or web address], password [facility] 08/23/2017, 7:57 AM  LOS: 3 days

## 2017-08-23 NOTE — Progress Notes (Signed)
Patient ID: Reginald [name], male   DOB: 12/30/1960, 57 y.o.   MRN: [id number]     Advanced Heart Failure Rounding Note  HF Cardiology: Reginald [name]  Subjective:    Yesterday diuresed with IV lasix. Negative 2.9 liters. Weight unchanged.    Denies SOB.    Objective:   Weight Range: 231 lb 11.2 oz (105.1 kg) Body mass index is 34.22 kg/m.   Vital Signs:   Temp:  [98.1 F (36.7 C)-99.2 F (37.3 C)] 99 F (37.2 C) (01/02 0335) Pulse Rate:  [82-88] 86 (01/02 0335) Resp:  [18-29] 25 (01/02 0335) BP: (106-121)/(72-82) 121/81 (01/02 0335) SpO2:  [93 %-99 %] 97 % (01/02 0718) Weight:  [231 lb 11.2 oz (105.1 kg)] 231 lb 11.2 oz (105.1 kg) (01/02 0308) Last BM Date: 08/22/17  Weight change: Filed Weights   08/21/17 0321 08/22/17 0402 08/23/17 0308  Weight: 243 lb 13.3 oz (110.6 kg) 231 lb 8 oz (105 kg) 231 lb 11.2 oz (105.1 kg)    Intake/Output:   Intake/Output Summary (Last 24 hours) at 08/23/2017 0810 Last data filed at 08/23/2017 0308 Gross per 24 hour  Intake 890 ml  Output 3875 ml  Net -2985 ml      Physical Exam    General:  Well appearing. No resp difficulty. In bed.  HEENT: normal Neck: supple. JVP 6-7. Carotids 2+ bilat; no bruits. No lymphadenopathy or thryomegaly appreciated. Cor: PMI nondisplaced. Regular rate & rhythm. No rubs, gallops or murmurs. Lungs: clear on 2 liters.  Abdomen: soft, nontender, nondistended. No hepatosplenomegaly. No bruits or masses. Good bowel sounds. Extremities: no cyanosis, clubbing, rash, edema Neuro: alert & orientedx3, cranial nerves grossly intact. moves all 4 extremities w/o difficulty. Affect pleasant   Telemetry   NSR 80s   Labs    CBC Recent Labs    08/21/17 0510  WBC 7.2  HGB 7.6*  HCT 24.1*  MCV 88.9  PLT 252   Basic Metabolic Panel Recent Labs    84/13/2412/31/18 0510 08/22/17 0211 08/23/17 0314  NA 131* 130* 129*  K 2.8* 3.7 3.8  CL 90* 86* 88*  CO2 32 34* 32  GLUCOSE 169* 207* 153*  BUN 18 18 18     CREATININE 0.88 0.91 0.85  CALCIUM 8.5* 8.6* 8.4*  MG 1.6* 2.1  --    Liver Function Tests No results for input(s): AST, ALT, ALKPHOS, BILITOT, PROT, ALBUMIN in the last 72 hours. No results for input(s): LIPASE, AMYLASE in the last 72 hours. Cardiac Enzymes Recent Labs    08/21/17 0510  CKTOTAL 36*    BNP: BNP (last 3 results) Recent Labs    07/26/17 0340 08/19/17 2300  BNP 104.6* 204.8*    ProBNP (last 3 results) No results for input(s): PROBNP in the last 8760 hours.   D-Dimer No results for input(s): DDIMER in the last 72 hours. Hemoglobin A1C Recent Labs    08/21/17 0510  HGBA1C 5.8*   Fasting Lipid Panel No results for input(s): CHOL, HDL, LDLCALC, TRIG, CHOLHDL, LDLDIRECT in the last 72 hours. Thyroid Function Tests Recent Labs    08/21/17 0510  TSH 2.141    Other results:   Imaging    Dg Chest 2 View  Result Date: 08/22/2017 CLINICAL DATA:  57 year old male with a history of congestive heart failure EXAM: CHEST  2 VIEW COMPARISON:  08/19/2017, 08/16/2017 FINDINGS: Cardiomediastinal silhouette unchanged in size and contour. Interlobular septal thickening bilateral lungs with mixed linear and patchy airspace opacities. Compare to the prior there is  mild improved aeration on the left. No pneumothorax. Blunting at the left costophrenic angle with meniscus on the lateral view. IMPRESSION: Mildly improved aeration of the left lung, with evidence of persisting pulmonary edema and small left pleural effusion. Electronically Signed   By: Gilmer Mor D.O.   On: 08/22/2017 09:44     Medications:     Scheduled Medications: . budesonide  0.25 mg Nebulization BID  . enoxaparin (LOVENOX) injection  40 mg Subcutaneous Q24H  . feeding supplement (GLUCERNA SHAKE)  237 mL Oral TID WC  . ferrous fumarate-b12-vitamic C-folic acid  1 capsule Oral TID PC  . insulin aspart  0-15 Units Subcutaneous TID WC  . insulin glargine  14 Units Subcutaneous Daily  .  lidocaine  1 patch Transdermal Q24H  . lisinopril  5 mg Oral Daily  . metoprolol tartrate  25 mg Oral BID  . potassium chloride  20 mEq Oral BID    Infusions: . sodium chloride    . ceFTAROline (TEFLARO) IV Stopped (08/22/17 2210)  . DAPTOmycin (CUBICIN)  IV Stopped (08/22/17 1744)    PRN Medications: sodium chloride, acetaminophen, ipratropium-albuterol    Patient Profile   Reginald [name] is a 57 y.o. 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   Admitted [facility] to hospital from [facility] 08/20/17 with resp distress and edema  Plan for potential MVR week of 1/7  Assessment/Plan   1. Acute on chronic diastolic CHF: In setting of severe MR.  Ultimately, he needs mitral valve replacement. Volume status improved. Start po lasix 80 mg daily. Prior to admit he was taking lasix 40 mg every 8 hours.  Add 12.5 mg spiro daily. BMET in am.  Renal function stable.  2. 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  Last blood cultures 12/25 NGTD.  Afebrile.  He continues on Taflaro and daptomycin.  Severe MR, suspect perforated leaflet.  Will need MVR.  - Will need to discuss timing of valve surgery with Dr Donata Clay.  - Continue lisinopril to 5 mg daily for afterload reduction.  3. Anemia: Suspect anemia of chronic disease, got 1 unit PRBCs 12/31, hgb remains 7.6. FOBT was recently negative. On Iron TID.  Transferrin saturation was low.  4. Deconditioning- PT recommending HH.  5. Hyponatremia - sodium 129. Restrict free water.    Length of Stay: 3  Amy Clegg, NP  08/23/2017, 8:10  AM  Advanced Heart Failure Team Pager [phone] (M-F; 7a - 4p)  Please contact [facility] for night-coverage after hours (4p -7a ) and weekends on [email or web address]  Patient seen with NP, agree with the above note.  Good diuresis again.  Will transition Lasix to po today.  Plan for MV surgery next Tuesday.  Can likely go home or to CIR prior.   Marca Ancona 08/23/2017 8:33 AM

## 2017-08-24 ENCOUNTER — Inpatient Hospital Stay (HOSPITAL_COMMUNITY): Payer: Medicaid Other

## 2017-08-24 DIAGNOSIS — I058 Other rheumatic mitral valve diseases: Secondary | ICD-10-CM

## 2017-08-24 LAB — BASIC METABOLIC PANEL
Anion gap: 8 (ref 5–15)
BUN: 18 mg/dL (ref 6–20)
CALCIUM: 8.3 mg/dL — AB (ref 8.9–10.3)
CO2: 31 mmol/L (ref 22–32)
CREATININE: 0.83 mg/dL (ref 0.61–1.24)
Chloride: 90 mmol/L — ABNORMAL LOW (ref 101–111)
GFR calc non Af Amer: >60 mL/min (ref >60)
GLUCOSE: 138 mg/dL — AB (ref 65–99)
Potassium: 4 mmol/L (ref 3.5–5.1)
Sodium: 129 mmol/L — ABNORMAL LOW (ref 135–145)

## 2017-08-24 LAB — GLUCOSE, CAPILLARY
GLUCOSE-CAPILLARY: 155 mg/dL — AB (ref 65–99)
GLUCOSE-CAPILLARY: 169 mg/dL — AB (ref 65–99)
Glucose-Capillary: 147 mg/dL — ABNORMAL HIGH (ref 65–99)
Glucose-Capillary: 153 mg/dL — ABNORMAL HIGH (ref 65–99)

## 2017-08-24 LAB — CBC
HCT: 28.9 % — ABNORMAL LOW (ref 39.0–52.0)
Hemoglobin: 9.1 g/dL — ABNORMAL LOW (ref 13.0–17.0)
MCH: 27.5 pg (ref 26.0–34.0)
MCHC: 31.5 g/dL (ref 30.0–36.0)
MCV: 87.3 fL (ref 78.0–100.0)
PLATELETS: 266 10*3/uL (ref 150–400)
RBC: 3.31 MIL/uL — ABNORMAL LOW (ref 4.22–5.81)
RDW: 16.2 % — ABNORMAL HIGH (ref 11.5–15.5)
WBC: 7.5 10*3/uL (ref 4.0–10.5)

## 2017-08-24 MED ORDER — KETOROLAC TROMETHAMINE 30 MG/ML IJ SOLN
30.0000 mg | Freq: Once | INTRAMUSCULAR | Status: AC
  Administered 2017-08-24: 30 mg via INTRAVENOUS
  Filled 2017-08-24: qty 1

## 2017-08-24 MED ORDER — DIPHENHYDRAMINE HCL 50 MG/ML IJ SOLN
25.0000 mg | Freq: Once | INTRAMUSCULAR | Status: AC
  Administered 2017-08-24: 25 mg via INTRAVENOUS
  Filled 2017-08-24: qty 1

## 2017-08-24 MED ORDER — FENTANYL CITRATE (PF) 100 MCG/2ML IJ SOLN
25.0000 ug | Freq: Once | INTRAMUSCULAR | Status: AC
  Administered 2017-08-24: 25 ug via INTRAVENOUS
  Filled 2017-08-24: qty 2

## 2017-08-24 MED ORDER — HEPARIN (PORCINE) IN NACL 100-0.45 UNIT/ML-% IJ SOLN
2700.0000 [IU]/h | INTRAMUSCULAR | Status: AC
  Administered 2017-08-24: 1550 [IU]/h via INTRAVENOUS
  Administered 2017-08-25: 1650 [IU]/h via INTRAVENOUS
  Administered 2017-08-25: 1850 [IU]/h via INTRAVENOUS
  Administered 2017-08-26: 2250 [IU]/h via INTRAVENOUS
  Administered 2017-08-27: 2650 [IU]/h via INTRAVENOUS
  Administered 2017-08-28 (×3): 2700 [IU]/h via INTRAVENOUS
  Filled 2017-08-24 (×10): qty 250

## 2017-08-24 MED ORDER — METOCLOPRAMIDE HCL 5 MG/ML IJ SOLN
10.0000 mg | Freq: Once | INTRAMUSCULAR | Status: AC
Start: 2017-08-24 — End: 2017-08-24
  Administered 2017-08-24: 10 mg via INTRAVENOUS
  Filled 2017-08-24: qty 2

## 2017-08-24 NOTE — Progress Notes (Signed)
CARDIAC REHAB PHASE I   PRE:  Rate/Rhythm: 85 SR    BP: lying 74/55, recheck 91/58    SaO2: 97 2L, 90-91 RA  MODE:  Ambulation: stood x2     BP: 118/59    SaO2 88-91 RA  Pt initially willing to walk but upon standing stated he didn't feel well. Checked BP but it was actually higher. SAO2 on RA borderline so I reapplied 2L. However pt still continued to feel poorly and stated he was worried about walking now. Main c/o is his neck pain. Pt chose to stay on EOB and try again to walk with PT later today. Left on 2L. Will f/u tomorrow. [phone]  Harriet MassonRandi Kristan Mckenze Slone CES, ACSM 08/24/2017 11:32 AM

## 2017-08-24 NOTE — Progress Notes (Signed)
ANTICOAGULATION CONSULT NOTE - Initial Consult  Pharmacy Consult for heparin Indication: DVT  Allergies  Allergen Reactions  . Morphine And Related Other (See Comments)    "freaks out"    Patient Measurements: Height: 5\' 9"  (175.3 cm) Weight: 229 lb 6.4 oz (104.1 kg) IBW/kg (Calculated) : 70.7 Heparin Dosing Weight: 96.2 kg  Vital Signs: Temp: 97.3 F (36.3 C) (01/03 1154) Temp Source: Axillary (01/03 1154) BP: 124/91 (01/03 1200) Pulse Rate: 84 (01/03 1200)  Labs: Recent Labs    08/22/17 0211 08/23/17 0314 08/24/17 0234  HGB  --   --  9.1*  HCT  --   --  28.9*  PLT  --   --  266  CREATININE 0.91 0.85 0.83    Estimated Creatinine Clearance: 116.8 mL/min (by C-G formula based on SCr of 0.83 mg/dL).   Medical History: Past Medical History:  Diagnosis Date  . Acute osteomyelitis of cervical spine (HCC) 08/10/2017  . Alcohol abuse   . CAD (coronary artery disease)   . Diabetes mellitus type 2 in obese (HCC)   . Mitral regurgitation   . Neck pain   . 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 Tobacco abuse     Medications:  Scheduled:  . budesonide  0.25 mg Nebulization BID  . chlorhexidine  15 mL Mouth/Throat BID  . feeding supplement (GLUCERNA SHAKE)  237 mL Oral TID WC  . ferrous fumarate-b12-vitamic C-folic acid  1 capsule Oral TID PC  . furosemide  80 mg Oral Daily  . insulin aspart  0-15 Units Subcutaneous TID WC  . insulin glargine  14 Units Subcutaneous Daily  . lidocaine  1 patch Transdermal Q24H  . lisinopril  5 mg Oral Daily  . metoprolol tartrate  25 mg Oral BID  . potassium chloride  20 mEq Oral BID  . spironolactone  12.5 mg Oral Daily    Assessment: 56 yom admitted for evaluation of CHF and pulmonary edema. Left lower extremity duplex yesterday found evidence of acute DVT involving the posterior tibial veins of lower extremity. He was on DVT prophylaxis prior to duplex and transitioned to therapeutic dosing 1 mg/kg every 12 hours- last dose  was on 1/3 at 0552.   Given valve surgery next week, will transition to IV heparin therapy. Will schedule infusion tonight at 1800 (~12 hrs after last dose). Hgb is 9.1 and platelets were within normal limits. Renal function is normal (sCr 0.83). No signs/symptoms of bleeding.   Goal of Therapy:  Heparin level 0.3-0.7 units/ml Monitor platelets by anticoagulation protocol: Yes   Plan:  Start heparin infusion at 1550 units/hr Check anti-Xa level in 6 hours and daily while on heparin Continue to monitor H&H and platelets  Girard CooterKimberly Perkins, PharmD Clinical Pharmacist  Pager: [phone] Clinical Phone for 08/24/2016 until 3:30pm: [phone] If after 3:30pm, please call main pharmacy at [phone] 08/24/2017,12:50 PM

## 2017-08-24 NOTE — Progress Notes (Signed)
Patient ID: Reginald [name], male   DOB: 12-17-1960, 57 y.o.   MRN: [id number]     Advanced Heart Failure Rounding Note  HF Cardiology: Shirlee Latch  Subjective:    Denies SOB. Feeling OK. Anxious for surgery. Would like to go home if possible in between, but now on IV heparin with DVT.   Down 2 more labs on po lasix.   MRI spine 08/24/17 with continued evidence of Osteomyelitis and joint infection in C1-C2, but positive response to treatment. Less regional edema and diminishing paraspinal fluid collections.  Less edema at C4-C5 level.  Objective:   Weight Range: 229 lb 6.4 oz (104.1 kg) Body mass index is 33.88 kg/m.   Vital Signs:   Temp:  [98.4 F (36.9 C)-98.9 F (37.2 C)] 98.4 F (36.9 C) (01/03 0331) Pulse Rate:  [80-91] 82 (01/03 0948) Resp:  [18-28] 18 (01/03 0948) BP: (104-116)/(65-68) 116/68 (01/03 0948) SpO2:  [95 %-97 %] 97 % (01/03 0948) Weight:  [229 lb 6.4 oz (104.1 kg)] 229 lb 6.4 oz (104.1 kg) (01/03 0218) Last BM Date: 08/22/17  Weight change: Filed Weights   08/22/17 0402 08/23/17 0308 08/24/17 0218  Weight: 231 lb 8 oz (105 kg) 231 lb 11.2 oz (105.1 kg) 229 lb 6.4 oz (104.1 kg)    Intake/Output:   Intake/Output Summary (Last 24 hours) at 08/24/2017 1015 Last data filed at 08/24/2017 0223 Gross per 24 hour  Intake 840 ml  Output 2340 ml  Net -1500 ml    Physical Exam    General:  NAD.  HEENT: normal Neck: supple. JVP 6-7. Carotids 2+ bilat; no bruits. No lymphadenopathy or thryomegaly appreciated. Cor: PMI nondisplaced. Regular rate & rhythm. No rubs, gallops or murmurs. Lungs: clear on 2 liters.  Abdomen: soft, nontender, nondistended. No hepatosplenomegaly. No bruits or masses. Good bowel sounds. Extremities: no cyanosis, clubbing, rash, edema Neuro: alert & orientedx3, cranial nerves grossly intact. moves all 4 extremities w/o difficulty. Affect pleasant  Telemetry   NSR 80s, personally reviewed.   Labs    CBC Recent Labs    08/24/17 0234    WBC 7.5  HGB 9.1*  HCT 28.9*  MCV 87.3  PLT 266   Basic Metabolic Panel Recent Labs    40/98/11 0211 08/23/17 0314 08/24/17 0234  NA 130* 129* 129*  K 3.7 3.8 4.0  CL 86* 88* 90*  CO2 34* 32 31  GLUCOSE 207* 153* 138*  BUN 18 18 18   CREATININE 0.91 0.85 0.83  CALCIUM 8.6* 8.4* 8.3*  MG 2.1  --   --    Liver Function Tests No results for input(s): AST, ALT, ALKPHOS, BILITOT, PROT, ALBUMIN in the last 72 hours. No results for input(s): LIPASE, AMYLASE in the last 72 hours. Cardiac Enzymes No results for input(s): CKTOTAL, CKMB, CKMBINDEX, TROPONINI in the last 72 hours.  BNP: BNP (last 3 results) Recent Labs    07/26/17 0340 08/19/17 2300  BNP 104.6* 204.8*    ProBNP (last 3 results) No results for input(s): PROBNP in the last 8760 hours.   D-Dimer No results for input(s): DDIMER in the last 72 hours. Hemoglobin A1C No results for input(s): HGBA1C in the last 72 hours. Fasting Lipid Panel No results for input(s): CHOL, HDL, LDLCALC, TRIG, CHOLHDL, LDLDIRECT in the last 72 hours. Thyroid Function Tests No results for input(s): TSH, T4TOTAL, T3FREE, THYROIDAB in the last 72 hours.  Invalid input(s): FREET3  Other results:   Imaging    Ct Angio Chest Pe W Or Wo  Contrast  Result Date: 08/23/2017 CLINICAL DATA:  Acute respiratory failure and hypoxia. Lower extremity swelling for 3 days. EXAM: CT ANGIOGRAPHY CHEST WITH CONTRAST TECHNIQUE: Multidetector CT imaging of the chest was performed using the standard protocol during bolus administration of intravenous contrast. Multiplanar CT image reconstructions and MIPs were obtained to evaluate the vascular anatomy. CONTRAST:  ISOVUE-370 IOPAMIDOL (ISOVUE-370) INJECTION 76% COMPARISON:  08/01/2017 FINDINGS: Cardiovascular: Good opacification of the central and segmental pulmonary arteries. No focal filling defects. No evidence of significant pulmonary embolus. Normal heart size. No pericardial effusion. Coronary  artery calcifications. Calcification of the aorta. Dilated ascending thoracic aorta at 4.4 cm. Mediastinum/Nodes: Prominent mediastinal lymph nodes without pathologic enlargement, likely reactive. Esophagus is decompressed. Thyroid gland is atrophic. Lungs/Pleura: Diffuse patchy airspace disease throughout both lungs. This may represent edema or multifocal pneumonia. Small bilateral pleural effusions with basilar atelectasis. Infiltrates have progressed since previous study. Effusions are unchanged. No pneumothorax. Secretions demonstrated within some of the basal bronchioles, possibly representing inflammatory secretions or mucous plugging. Upper Abdomen: No acute abnormality. Musculoskeletal: Degenerative changes in the spine. No destructive bone lesions. Review of the MIP images confirms the above findings. IMPRESSION: 1. No evidence of significant pulmonary embolus. 2. Dilated ascending thoracic aorta at 4.4 cm. Recommend annual imaging followup by CTA or MRA. This recommendation follows 2010 ACCF/AHA/AATS/ACR/ASA/SCA/SCAI/SIR/STS/SVM Guidelines for the Diagnosis and Management of Patients with Thoracic Aortic Disease. Circulation. 2010; 121: Z610-R604 3. Diffuse patchy airspace disease throughout both lungs, progressing since previous study. This could represent edema or multifocal pneumonia. Small bilateral pleural effusions with basilar atelectasis. Secretions versus mucous plugging in the lower lobe bronchioles. 4. Aortic atherosclerosis.  Coronary artery calcification. Aortic Atherosclerosis (ICD10-I70.0). Aortic aneurysm NOS (ICD10-I71.9). Electronically Signed   By: Burman Nieves M.D.   On: 08/23/2017 23:49   Mr Cervical Spine Wo Contrast  Result Date: 08/24/2017 CLINICAL DATA:  Followup spinal infection EXAM: MRI CERVICAL SPINE WITHOUT CONTRAST TECHNIQUE: Multiplanar, multisequence MR imaging of the cervical spine was performed. No intravenous contrast was administered. COMPARISON:  MRI 08/08/2017.   MRI 06/2028 2018.  CT 07/20/2017. FINDINGS: Alignment: No change in alignment. Vertebrae: The patient has osteomyelitis at the C1-2 level with septic arthritis of the skullbase C1 articulation and C1-C2 articulation. Today's study was ordered and performed without contrast, hindering evaluation to some degree. I think there is slightly less edema in the region and I think that paraspinous fluid collections are smaller. I do not see any progressive disease or new finding. No cord compression. Mild edema at the C4-5 level also appears slightly improved without progression or new finding. Chronic discogenic endplate changes at C5-6 appear the same. Cord: No cord compression or primary cord lesion. Posterior Fossa, vertebral arteries, paraspinal tissues: As discussed above. Disc levels: As discussed above. IMPRESSION: Today's study was done without contrast. Continued evidence of osteomyelitis and joint infection in the C1-2 region, with evidence of positive response to treatment since the study of 08/08/2017. There is less regional edema and the small paraspinous fluid collections are diminishing. No progressive or worsening findings. There is also less edema at the C4-5 level. No worsening or new finding. Electronically Signed   By: Paulina Fusi M.D.   On: 08/24/2017 10:06     Medications:     Scheduled Medications: . budesonide  0.25 mg Nebulization BID  . chlorhexidine  15 mL Mouth/Throat BID  . enoxaparin (LOVENOX) injection  105 mg Subcutaneous Q12H  . feeding supplement (GLUCERNA SHAKE)  237 mL Oral TID WC  .  ferrous fumarate-b12-vitamic C-folic acid  1 capsule Oral TID PC  . furosemide  80 mg Oral Daily  . insulin aspart  0-15 Units Subcutaneous TID WC  . insulin glargine  14 Units Subcutaneous Daily  . lidocaine  1 patch Transdermal Q24H  . lisinopril  5 mg Oral Daily  . metoprolol tartrate  25 mg Oral BID  . potassium chloride  20 mEq Oral BID  . spironolactone  12.5 mg Oral Daily     Infusions: . sodium chloride    . DAPTOmycin (CUBICIN)  IV Stopped (08/23/17 1918)    PRN Medications: sodium chloride, acetaminophen, ipratropium-albuterol    Patient Profile   Reginald [name] is a 57 y.o. 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   [facility] with resp distress and edema  Plan for potential MVR week of 1/7  Assessment/Plan   1. Acute on chronic diastolic CHF: In setting of severe MR.  Ultimately, he needs mitral valve replacement. - Volume status stable.  - Continue lasix po 80 mg daily.  - Continue 12.5 mg spiro daily. Pressures in 100s, but may consider increasing.  - Creatinine stable.  2. 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  Last blood cultures 12/25 NGTD.  Afebrile.  He continues on Taflaro and daptomycin.  Severe MR, suspect perforated leaflet.  Will need MVR.  - Plan surgery Tuesday, 08/29/17.  - Continue lisinopril to 5 mg daily for afterload reduction.  3. Anemia: Suspect anemia of chronic disease, got 1 unit PRBCs 12/31 - Hgb 9.1 this am.  FOBT was recently negative. On Iron TID.  Transferrin saturation was low.  4. Deconditioning - PT recommends HH.   5. Hyponatremia  - Na 129. Continue free water restriction.  6. DVT: Left PT vein (below knee).  He is on IV heparin gtt.   Length of Stay: 4  Luane SchoolMichael Andrew Tillery, PA-C  08/24/2017, 10:15 AM  Advanced Heart Failure Team Pager [phone] (M-F; 7a - 4p)  Please contact [facility] for night-coverage after hours (4p -7a ) and weekends on  [email or web address]  Patient seen with PA, agree with the above note.    He is stable in terms of CHF, weight down about 22 lbs.  He is breathing better.  Continue Lasix 80 mg daily.   He has been found to have a left below the knee DVT (PT vein).  Treatment here is not straightforward, however in a patient at high risk for progression (will be sedentary, etc), would anticoagulate.  No PE on CTA chest.  He has been started on IV heparin gtt.   Marca AnconaDalton McLean 08/24/2017

## 2017-08-24 NOTE — Progress Notes (Signed)
  Subjective: Venous duplex and CTA of chest reviewed I DVT L leg needs heparin Walked 150 ft with CR-I Will ask pharmacy to switch to iv heparin from Lovenox for DVT sinc e surgery is planned MVR on for jan 8 am  Objective: Vital signs in last 24 hours: Temp:  [98.4 F (36.9 C)-98.9 F (37.2 C)] 98.4 F (36.9 C) (01/03 0331) Pulse Rate:  [80-91] 82 (01/03 0331) Cardiac Rhythm: Heart block (01/03 0738) Resp:  [24-28] 28 (01/03 0331) BP: (104-113)/(65-67) 113/65 (01/03 0331) SpO2:  [95 %-96 %] 96 % (01/03 0331) Weight:  [229 lb 6.4 oz (104.1 kg)] 229 lb 6.4 oz (104.1 kg) (01/03 0218)  Hemodynamic parameters for last 24 hours:  nsr  Intake/Output from previous day: 01/02 0701 - 01/03 0700 In: 1080 [P.O.:1080] Out: 2340 [Urine:2340] Intake/Output this shift: No intake/output data recorded.    Lab Results: Recent Labs    08/24/17 0234  WBC 7.5  HGB 9.1*  HCT 28.9*  PLT 266   BMET:  Recent Labs    08/23/17 0314 08/24/17 0234  NA 129* 129*  K 3.8 4.0  CL 88* 90*  CO2 32 31  GLUCOSE 153* 138*  BUN 18 18  CREATININE 0.85 0.83  CALCIUM 8.4* 8.3*    PT/INR: No results for input(s): LABPROT, INR in the last 72 hours. ABG    Component Value Date/Time   PHART 7.417 08/20/2017 0130   HCO3 26.6 08/20/2017 0130   TCO2 31 08/02/2017 1553   O2SAT 100.0 08/20/2017 0130   CBG (last 3)  Recent Labs    08/23/17 1655 08/23/17 2121 08/24/17 0612  GLUCAP 155* 153* 147*    Assessment/Plan: S/P  MVR Jan 8 for severe MR, endocarditis, blood cultures negative DVT w/o PE  LOS: 4 days    Kathlee Nationseter Van Trigt III 08/24/2017

## 2017-08-24 NOTE — Progress Notes (Signed)
Pt complained of extreme headache, stating it felt like his head was "being beaten by hammers." Tylenol did not touch the pain.  The hospitalist, Arvilla MeresKatharine Schorr, [place], was informed and ordered one time doses of Reglan 10 mg IV, Benadryl 25 mg IV, and Toradol 30 mg IV.  Pt was given meds and is now resting comfortably in bed.  Will continue to monitor pt.  Harriet Massonavidson, Infantof Villagomez E, RN

## 2017-08-24 NOTE — Progress Notes (Signed)
PROGRESS NOTE    Reginald [name]  RUE:[id number] DOB: 1961/03/10 DOA: 08/20/2017 PCP: Patient, No Pcp Per   Brief Narrative:  He was hospitalized from  12/4 to 12/20 mrsa endocarditis , he was discharged to rehab on iv abx ( ceftaroline and daptomycin) in anticipating cardiac thoracic surgery once he regain some strength. He is sent back to the hospital due to hypoxia, chf exacerbation  He reports has noticed weight gain and increased lower extremity edema for the last 2-3 days while he was at inpatient rehab center  Assessment & Plan:   Principal Problem:   Acute respiratory failure (HCC) Active Problems:   MRSA bacteremia   Mitral valve vegetation   Acute diastolic CHF (congestive heart failure) (HCC)   Endocarditis of mitral valve   Cerebral septic emboli (HCC)   Hyponatremia   Essential hypertension   Type 2 diabetes mellitus with peripheral neuropathy (HCC)  Acute respiratory failure (HCC) likely from CHF Exacerbation -Likely secondary to CHF exacerbation in the setting of severe mitral valve regurgiation -He was sent from inpatient rehab center to hospital and admitted to stepdown, he is started on iv lasix/metolazone and put on bipap,  -much improved, Continue lasix 80 mg po Daily per Cards, d/c metolazone d/ed, change bipap to prn -heart failure team consulted, meds per heart failure team.  Bilateral lower extremity edema with Left worse than Right 2/2 Acute DVT; -Left > right -Venous doppler showed acute DVT involving the posterior tibial veins of the LLE -Given full dose Lovenox and changed to IV Heparin gtt -Dg x ray left foot "Fracture proximal aspect of fifth proximal -phalanx, stable from 1 week prior. Minimal healing response in this area. No other fracture. No dislocation" -Chest CTA showed No evidence of significant pulmonary embolus. Dilated ascending thoracic aorta at 4.4 cm.  Diffuse patchy airspace disease throughout both lungs, progressing since previous  study. This could represent edema or multifocal pneumonia. Small bilateral pleural effusions with basilar atelectasis. Secretions versus mucous plugging in the lower lobe bronchioles. Aortic atherosclerosis.  Coronary artery calcification.  MRSA bacteremia  MV endocarditis, Large vegetation Possible AV endocarditis CNS, septic emboli to brain Possible multifocal pneumonia on CT C1 vertebral osteo, C4 discitis, paraspinal abscess -He was hospitalized from  12/4 to 12/20 for this , he was discharged to rehab on iv abx ( Daptomycin) in anticipating cardiac thoracic surgery once he regain some strength. -Blood culture has been negative since 12/13 -Per last infectious disease note on 12/20 by Dr Zenaida Niece dam:  C/w Monotherapy Daptomycin through Surgery and 6 weeks after and send valve tissue for culture from [facility] Cervical spine osteo and paraspinal abscess -Repeat MRI Done w/o Contrast showed  Continued evidence of osteomyelitis and joint infection in the C1-2 region, with evidence of positive response to treatment since the study of 08/08/2017.There is less regional edema and the small paraspinous fluid collections are diminishing. No progressive or worsening findings. There is also less edema at the C4-5 level.No worsening or new finding. -Dr. Daiva Eves recommended getting a repeat one prior to Stopping IV Abx -Monitor ck weekly. -Continue daptomycin for now -Thoracic surgery consulted, will follow recommendation.  Vertebral osteomyelitis -This was noted on MRI C-spine ordered by ID. There was osteomyelitis of the odontoid process. Case was discussed with Neurosurgery who reviewed films during last hospitalization, recommended repeat MRI C-spine in 4-6 weeks (Jan 10-20th roughly). -However, in light of neck hyperextension for anesthesia, would recommend repeat MRI imaging before surgery -MRI  cervical spine ordered on  1/2 and done 1/3 showed The patient has osteomyelitis at the C1-2 level with  septic arthritis of the skullbase C1 articulation and C1-C2 articulation. Today's study was ordered and performed without contrast, hindering evaluation to some degree. I think there is slightly less edema in the region and I think that paraspinous fluid collections are smaller. I do not see any progressive disease or new finding. No cord compression. Mild edema at the C4-5 level also appears slightly improved without progression or new finding. Chronic discogenic endplate changes at C5-6 appear the same. -Will Consider Repeating MRI with Contrast prior to Surgery on 08/29/16  HTN:  -Continue lopressor and lisinopril, lasix  Diabetes: He was started on insulin during last hospitalization, prior to that he was not on insulin a1c 5.8 ( may not be reliable due to anemia, hgb 7.6) Continue insulin  Normocytic Anemia: -No sign of bleeding -Transfused prbc x1 on 12/31 -Hb/Hct was 9.1/28.9 -Continue to Monitor for S/Sx of Bleeding as patient is on Heparin gtt  H/o alcohol use,  -Has alcohol delirum needing precedex drip and intubation during last hospitalization. -Not an acute issue since he has been at rehab since last  Discharge on 12/20.   Hyponatremia -Patient's Na+ was 129 -Likley from Diuresis/Volume Overload -Continue to Monitor and Repeat in AM   DVT prophylaxis: Anticoagulated with Heparin gtt Code Status: FULL CODE Family Communication: No Family present at bedside Disposition Plan: Remain Inpatient for anticipated Surgery   Consultants:   Dr. Cindra Eves  Cardiology Dr. Marca Ancona  Cardiothoracic Surgery Dr. Donata Clay    Procedures:  None   Antimicrobials:  Anti-infectives (From admission, onward)   Start     Dose/Rate Route Frequency Ordered Stop   08/20/17 1500  DAPTOmycin (CUBICIN) 1,000 mg in sodium chloride 0.9 % IVPB     1,000 mg 240 mL/hr over 30 Minutes Intravenous Every 24 hours 08/20/17 0117 09/11/17 1829   08/20/17 1000  ceftaroline  (TEFLARO) 600 mg in sodium chloride 0.9 % 250 mL IVPB  Status:  Discontinued     600 mg 250 mL/hr over 60 Minutes Intravenous Every 12 hours 08/20/17 0451 08/23/17 1253     Subjective: Seen and examined and was feeling disappointed about all his medical conditions. No CP or SOB. No nausea or vomiting.   Objective: Vitals:   08/23/17 1959 08/23/17 2319 08/24/17 0218 08/24/17 0331  BP: 106/67 106/65  113/65  Pulse: 85 80  82  Resp: (!) 24 (!) 26  (!) 28  Temp: 98.5 F (36.9 C) 98.6 F (37 C)  98.4 F (36.9 C)  TempSrc: Oral Oral  Oral  SpO2: 95% 95%  96%  Weight:   104.1 kg (229 lb 6.4 oz)   Height:        Intake/Output Summary (Last 24 hours) at 08/24/2017 0805 Last data filed at 08/24/2017 1610 Gross per 24 hour  Intake 1080 ml  Output 2340 ml  Net -1260 ml   Filed Weights   08/22/17 0402 08/23/17 0308 08/24/17 0218  Weight: 105 kg (231 lb 8 oz) 105.1 kg (231 lb 11.2 oz) 104.1 kg (229 lb 6.4 oz)   Examination: Physical Exam:  Constitutional: WN/WD Caucasian male in NAD and appears calm  Eyes: Lids and conjunctivae normal, sclerae anicteric  ENMT: External Ears, Nose appear normal. Grossly normal hearing. Mucous membranes are moist. No Dentition  Neck: Appears normal, supple, no cervical masses, normal ROM, no appreciable thyromegaly, no appreciable JVD Respiratory: Diminished to auscultation bilaterally, no wheezing,  rales, rhonchi or crackles. Normal respiratory effort and patient is not tachypenic. No accessory muscle use.  Cardiovascular: RRR, no murmurs / rubs / gallops. S1 and S2 auscultated.  Abdomen: Soft, non-tender, non-distended. No masses palpated. No appreciable hepatosplenomegaly. Bowel sounds positive x4 GU: Deferred. Musculoskeletal: No clubbing / cyanosis of digits/nails. No joint deformity upper and lower extremities.  Skin: No rashes, lesions, ulcers on a limited skin eval. No induration; Warm and dry.  Neurologic: CN 2-12 grossly intact with no  appreciable  focal deficits.  Romberg sign and cerebellar reflexes not assessed.  Psychiatric: Normal judgment and insight. Alert and oriented x 3. Normal mood and appropriate affect.   Data Reviewed: I have personally reviewed following labs and imaging studies  CBC: Recent Labs  Lab 08/20/17 0326 08/21/17 0510 08/24/17 0234  WBC 10.5 7.2 7.5  NEUTROABS 8.2*  --   --   HGB 7.8* 7.6* 9.1*  HCT 25.0* 24.1* 28.9*  MCV 90.6 88.9 87.3  PLT 262 252 266   Basic Metabolic Panel: Recent Labs  Lab 08/19/17 2300 08/21/17 0510 08/22/17 0211 08/23/17 0314 08/24/17 0234  NA 130* 131* 130* 129* 129*  K 4.9 2.8* 3.7 3.8 4.0  CL 97* 90* 86* 88* 90*  CO2 22 32 34* 32 31  GLUCOSE 182* 169* 207* 153* 138*  BUN 17 18 18 18 18   CREATININE 0.86 0.88 0.91 0.85 0.83  CALCIUM 8.4* 8.5* 8.6* 8.4* 8.3*  MG  --  1.6* 2.1  --   --    GFR: Estimated Creatinine Clearance: 116.8 mL/min (by C-G formula based on SCr of 0.83 mg/dL). Liver Function Tests: No results for input(s): AST, ALT, ALKPHOS, BILITOT, PROT, ALBUMIN in the last 168 hours. No results for input(s): LIPASE, AMYLASE in the last 168 hours. No results for input(s): AMMONIA in the last 168 hours. Coagulation Profile: No results for input(s): INR, PROTIME in the last 168 hours. Cardiac Enzymes: Recent Labs  Lab 08/21/17 0510  CKTOTAL 36*   BNP (last 3 results) No results for input(s): PROBNP in the last 8760 hours. HbA1C: No results for input(s): HGBA1C in the last 72 hours. CBG: Recent Labs  Lab 08/23/17 0654 08/23/17 1148 08/23/17 1655 08/23/17 2121 08/24/17 0612  GLUCAP 138* 147* 155* 153* 147*   Lipid Profile: No results for input(s): CHOL, HDL, LDLCALC, TRIG, CHOLHDL, LDLDIRECT in the last 72 hours. Thyroid Function Tests: No results for input(s): TSH, T4TOTAL, FREET4, T3FREE, THYROIDAB in the last 72 hours. Anemia Panel: No results for input(s): VITAMINB12, FOLATE, FERRITIN, TIBC, IRON, RETICCTPCT in the last 72  hours. Sepsis Labs: No results for input(s): PROCALCITON, LATICACIDVEN in the last 168 hours.  Recent Results (from the past 240 hour(s))  Culture, blood (Routine X 2) w Reflex to ID Panel     Status: None   Collection Time: 08/16/17  6:00 AM  Result Value Ref Range Status   Specimen Description BLOOD LEFT ANTECUBITAL  Final   Special Requests   Final    BOTTLES DRAWN AEROBIC AND ANAEROBIC Blood Culture adequate volume   Culture NO GROWTH 5 DAYS  Final   Report Status 08/21/2017 FINAL  Final  Culture, blood (Routine X 2) w Reflex to ID Panel     Status: None   Collection Time: 08/16/17  2:55 PM  Result Value Ref Range Status   Specimen Description BLOOD LEFT ANTECUBITAL  Final   Special Requests   Final    BOTTLES DRAWN AEROBIC ONLY Blood Culture adequate volume  Culture NO GROWTH 5 DAYS  Final   Report Status 08/21/2017 FINAL  Final    Radiology Studies: Dg Chest 2 View  Result Date: 08/22/2017 CLINICAL DATA:  57 year old male with a history of congestive heart failure EXAM: CHEST  2 VIEW COMPARISON:  08/19/2017, 08/16/2017 FINDINGS: Cardiomediastinal silhouette unchanged in size and contour. Interlobular septal thickening bilateral lungs with mixed linear and patchy airspace opacities. Compare to the prior there is mild improved aeration on the left. No pneumothorax. Blunting at the left costophrenic angle with meniscus on the lateral view. IMPRESSION: Mildly improved aeration of the left lung, with evidence of persisting pulmonary edema and small left pleural effusion. Electronically Signed   By: Gilmer Mor D.O.   On: 08/22/2017 09:44   Ct Angio Chest Pe W Or Wo Contrast  Result Date: 08/23/2017 CLINICAL DATA:  Acute respiratory failure and hypoxia. Lower extremity swelling for 3 days. EXAM: CT ANGIOGRAPHY CHEST WITH CONTRAST TECHNIQUE: Multidetector CT imaging of the chest was performed using the standard protocol during bolus administration of intravenous contrast. Multiplanar CT  image reconstructions and MIPs were obtained to evaluate the vascular anatomy. CONTRAST:  ISOVUE-370 IOPAMIDOL (ISOVUE-370) INJECTION 76% COMPARISON:  08/01/2017 FINDINGS: Cardiovascular: Good opacification of the central and segmental pulmonary arteries. No focal filling defects. No evidence of significant pulmonary embolus. Normal heart size. No pericardial effusion. Coronary artery calcifications. Calcification of the aorta. Dilated ascending thoracic aorta at 4.4 cm. Mediastinum/Nodes: Prominent mediastinal lymph nodes without pathologic enlargement, likely reactive. Esophagus is decompressed. Thyroid gland is atrophic. Lungs/Pleura: Diffuse patchy airspace disease throughout both lungs. This may represent edema or multifocal pneumonia. Small bilateral pleural effusions with basilar atelectasis. Infiltrates have progressed since previous study. Effusions are unchanged. No pneumothorax. Secretions demonstrated within some of the basal bronchioles, possibly representing inflammatory secretions or mucous plugging. Upper Abdomen: No acute abnormality. Musculoskeletal: Degenerative changes in the spine. No destructive bone lesions. Review of the MIP images confirms the above findings. IMPRESSION: 1. No evidence of significant pulmonary embolus. 2. Dilated ascending thoracic aorta at 4.4 cm. Recommend annual imaging followup by CTA or MRA. This recommendation follows 2010 ACCF/AHA/AATS/ACR/ASA/SCA/SCAI/SIR/STS/SVM Guidelines for the Diagnosis and Management of Patients with Thoracic Aortic Disease. Circulation. 2010; 121: Z610-R604 3. Diffuse patchy airspace disease throughout both lungs, progressing since previous study. This could represent edema or multifocal pneumonia. Small bilateral pleural effusions with basilar atelectasis. Secretions versus mucous plugging in the lower lobe bronchioles. 4. Aortic atherosclerosis.  Coronary artery calcification. Aortic Atherosclerosis (ICD10-I70.0). Aortic aneurysm NOS  (ICD10-I71.9). Electronically Signed   By: Burman Nieves M.D.   On: 08/23/2017 23:49   Scheduled Meds: . budesonide  0.25 mg Nebulization BID  . chlorhexidine  15 mL Mouth/Throat BID  . enoxaparin (LOVENOX) injection  105 mg Subcutaneous Q12H  . feeding supplement (GLUCERNA SHAKE)  237 mL Oral TID WC  . ferrous fumarate-b12-vitamic C-folic acid  1 capsule Oral TID PC  . furosemide  80 mg Oral Daily  . insulin aspart  0-15 Units Subcutaneous TID WC  . insulin glargine  14 Units Subcutaneous Daily  . lidocaine  1 patch Transdermal Q24H  . lisinopril  5 mg Oral Daily  . metoprolol tartrate  25 mg Oral BID  . potassium chloride  20 mEq Oral BID  . spironolactone  12.5 mg Oral Daily   Continuous Infusions: . sodium chloride    . DAPTOmycin (CUBICIN)  IV Stopped (08/23/17 1918)    LOS: 4 days   Merlene Laughter, DO [facility] Pager  [phone]  If 7PM-7AM, please contact night-coverage [email or web address] Password [email or web address] 08/24/2017, 8:05 AM

## 2017-08-24 NOTE — Progress Notes (Signed)
[facility] working with hospital team to prepare for  DC home with [facility]X and [facility] when deemed ready for DC.  [facility] will provide IV ABX and partner with a HH agency that can support [facility] Mr[facility] Reginald [name] as [name] [facility].   If patient discharges after hours, please call [phone].   Sedalia Mutaamela S Chandler 08/24/2017, 11:44 AM

## 2017-08-24 NOTE — Progress Notes (Signed)
Physical Therapy Treatment Patient Details Name: Reginald [name] MRN: [id number] DOB: [birth year] Today's Date: 08/24/2017    History of Present Illness Reginald Hazelwoodis a 57 y.o.malewith medical history significant ofosteomyelitis of the spine, alcohol abuse, type 2 diabetes, paraspinal abscess, tobacco abuse, nonobstructive CAD, mitral regurgitation whohad very complicated admissionfrom 07/25/2017 to 08/10/2017 due to mitral valve large vegetation/endocarditis, possible aortic valve endocarditis, septic emboli to the brain, multifocal pneumonia, C1 osteomyelitis, C4 discitis and paraspinal abscess due to MRSA bacteremia.Admitted back to hospital from CIR 08/20/17 with resp distress and edema.  Plan for MVR 08/29/17.     PT Comments    Pt performed increased gait and functional mobility but remains limited due to deconditioning.  Pt with increased WOB during session and briefly desaturated during last 10 ft of second trial to 86% on 3L [place].  Pt reports feeling dizzy sitting edge of bed BP obtained 109/63 and post session 112/75.  Pt continues to benefit from HHPT at d/c.  Plan next session for stair training.     Follow Up Recommendations  Home health PT;Supervision/Assistance - 24 hour     Equipment Recommendations  Rolling walker with 5" wheels    Recommendations for Other Services OT consult     Precautions / Restrictions Precautions Precautions: Fall Precaution Comments: debility, watch O2 sats Restrictions Weight Bearing Restrictions: No    Mobility  Bed Mobility Overal bed mobility: Needs Assistance Bed Mobility: Supine to Sit     Supine to sit: Supervision     General bed mobility comments: Pt required rocking momentum but able to achieve sitting edge of bed unassisted.    Transfers Overall transfer level: Needs assistance   Transfers: Sit to/from Stand Sit to Stand: Min guard         General transfer comment: verbal cues for hand placement.  Min guard to  steady.     Ambulation/Gait Ambulation/Gait assistance: Min assist Ambulation Distance (Feet): 100 Feet(x2 trials with seated rest break between.  ) Assistive device: Rolling walker (2 wheeled) Gait Pattern/deviations: Step-through pattern;Decreased step length - right;Decreased step length - left;Shuffle;Trunk flexed Gait velocity: decr   General Gait Details: Cues for forward gaze, upper trunk control and sequencing and increasing stride length.  Pt on 3L O2 with O2 sats 86%-94%.  Cues for  pursed lip breathing.     Stairs            Wheelchair Mobility    Modified Rankin (Stroke Patients Only)       Balance Overall balance assessment: Needs assistance   Sitting balance-Leahy Scale: Good       Standing balance-Leahy Scale: Poor Standing balance comment: Pt needs RW and external support for balance                            Cognition Arousal/Alertness: Awake/alert Behavior During Therapy: WFL for tasks assessed/performed Overall Cognitive Status: Within Functional Limits for tasks assessed                                        Exercises      General Comments        Pertinent Vitals/Pain Pain Assessment: No/denies pain    Home Living                      Prior Function  PT Goals (current goals can now be found in the care plan section) Acute Rehab PT Goals Patient Stated Goal: go home Potential to Achieve Goals: Good Progress towards PT goals: Progressing toward goals    Frequency    Min 3X/week      PT Plan Current plan remains appropriate    Co-evaluation              AM-PAC PT "6 Clicks" Daily Activity  Outcome Measure  Difficulty turning over in bed (including adjusting bedclothes, sheets and blankets)?: None Difficulty moving from lying on back to sitting on the side of the bed? : A Lot Difficulty sitting down on and standing up from a chair with arms (e.g., wheelchair,  bedside commode, etc,.)?: Unable Help needed moving to and from a bed to chair (including a wheelchair)?: A Little Help needed walking in hospital room?: A Little Help needed climbing 3-5 steps with a railing? : A Little 6 Click Score: 16    End of Session Equipment Utilized During Treatment: Gait belt;Oxygen Activity Tolerance: Patient limited by fatigue Patient left: in chair;with call bell/phone within reach;with chair alarm set Nurse Communication: Mobility status PT Visit Diagnosis: Unsteadiness on feet (R26.81);Other abnormalities of gait and mobility (R26.89);Muscle weakness (generalized) (M62.81)     Time: [id number] PT Time Calculation (min) (ACUTE ONLY): 35 min  Charges:  $Gait Training: 8-22 mins $Therapeutic Activity: 8-22 mins                    G Codes:       Reginald [name], PTA pager [phone]    Reginald [name] 08/24/2017, 4:26 PM

## 2017-08-25 LAB — COMPREHENSIVE METABOLIC PANEL
ALBUMIN: 2.3 g/dL — AB (ref 3.5–5.0)
ALT: 24 U/L (ref 17–63)
AST: 29 U/L (ref 15–41)
Alkaline Phosphatase: 191 U/L — ABNORMAL HIGH (ref 38–126)
Anion gap: 11 (ref 5–15)
BUN: 15 mg/dL (ref 6–20)
CHLORIDE: 90 mmol/L — AB (ref 101–111)
CO2: 28 mmol/L (ref 22–32)
Calcium: 8.5 mg/dL — ABNORMAL LOW (ref 8.9–10.3)
Creatinine, Ser: 0.77 mg/dL (ref 0.61–1.24)
GFR calc Af Amer: >60 mL/min (ref >60)
GFR calc non Af Amer: >60 mL/min (ref >60)
GLUCOSE: 185 mg/dL — AB (ref 65–99)
POTASSIUM: 4.3 mmol/L (ref 3.5–5.1)
SODIUM: 129 mmol/L — AB (ref 135–145)
Total Bilirubin: 1 mg/dL (ref 0.3–1.2)
Total Protein: 7.8 g/dL (ref 6.5–8.1)

## 2017-08-25 LAB — GLUCOSE, CAPILLARY
GLUCOSE-CAPILLARY: 150 mg/dL — AB (ref 65–99)
GLUCOSE-CAPILLARY: 179 mg/dL — AB (ref 65–99)
Glucose-Capillary: 187 mg/dL — ABNORMAL HIGH (ref 65–99)
Glucose-Capillary: 180 mg/dL — ABNORMAL HIGH (ref 65–99)

## 2017-08-25 LAB — CBC WITH DIFFERENTIAL/PLATELET
BASOS ABS: 0 10*3/uL (ref 0.0–0.1)
BASOS PCT: 1 %
EOS ABS: 0.4 10*3/uL (ref 0.0–0.7)
EOS PCT: 4 %
HCT: 29.5 % — ABNORMAL LOW (ref 39.0–52.0)
Hemoglobin: 9.2 g/dL — ABNORMAL LOW (ref 13.0–17.0)
Lymphocytes Relative: 24 %
Lymphs Abs: 2 10*3/uL (ref 0.7–4.0)
MCH: 27.5 pg (ref 26.0–34.0)
MCHC: 31.2 g/dL (ref 30.0–36.0)
MCV: 88.3 fL (ref 78.0–100.0)
MONO ABS: 0.4 10*3/uL (ref 0.1–1.0)
Monocytes Relative: 5 %
NEUTROS ABS: 5.5 10*3/uL (ref 1.7–7.7)
Neutrophils Relative %: 66 %
PLATELETS: 283 10*3/uL (ref 150–400)
RBC: 3.34 MIL/uL — ABNORMAL LOW (ref 4.22–5.81)
RDW: 16.1 % — AB (ref 11.5–15.5)
WBC: 8.3 10*3/uL (ref 4.0–10.5)

## 2017-08-25 LAB — HEPARIN LEVEL (UNFRACTIONATED)
HEPARIN UNFRACTIONATED: 0.18 [IU]/mL — AB (ref 0.30–0.70)
HEPARIN UNFRACTIONATED: 0.17 [IU]/mL — AB (ref 0.30–0.70)
HEPARIN UNFRACTIONATED: 0.31 [IU]/mL (ref 0.30–0.70)

## 2017-08-25 LAB — PHOSPHORUS: Phosphorus: 3 mg/dL (ref 2.5–4.6)

## 2017-08-25 LAB — MAGNESIUM: MAGNESIUM: 1.7 mg/dL (ref 1.7–2.4)

## 2017-08-25 MED ORDER — HEPARIN BOLUS VIA INFUSION
2000.0000 [IU] | Freq: Once | INTRAVENOUS | Status: AC
  Administered 2017-08-25: 2000 [IU] via INTRAVENOUS
  Filled 2017-08-25: qty 2000

## 2017-08-25 MED ORDER — FUROSEMIDE 40 MG PO TABS
40.0000 mg | ORAL_TABLET | Freq: Every evening | ORAL | Status: DC
  Administered 2017-08-25 – 2017-08-26 (×2): 40 mg via ORAL
  Filled 2017-08-25 (×2): qty 1

## 2017-08-25 NOTE — Progress Notes (Signed)
CARDIAC REHAB PHASE I   PRE:  Rate/Rhythm: 87 SR    BP: sitting 119/74    SaO2: 94 2L  MODE:  Ambulation: 150 ft   POST:  Rate/Rhythm: 92 SR    BP: sitting 116/73     SaO2: 93 2L  Pt felt ok in bed but once sitting up stated he didn't feel well and wasn't sure about walking. Encouraged him to try. Slow pace with RW and 2L. Fatigued at 75 ft and turned around. To recliner, tired. VSS. Surprised at how tired he gets. Discussed sternal precautions, IS, mobility, and d/c planning. Gave [facility] booklet and careplan. Video not working. [id number]  Harriet MassonRandi Kristan Lonnel Gjerde CES, ACSM 08/25/2017 12:04 PM

## 2017-08-25 NOTE — Progress Notes (Addendum)
ANTICOAGULATION CONSULT NOTE   Pharmacy Consult for Heparin Indication: DVT  Allergies  Allergen Reactions  . Morphine And Related Other (See Comments)    "freaks out"    Patient Measurements: Height: 5\' 9"  (175.3 cm) Weight: 230 lb 11.2 oz (104.6 kg) IBW/kg (Calculated) : 70.7 Heparin Dosing Weight: 96.2 kg  Vital Signs: Temp: 97.5 F (36.4 C) (01/04 1215) Temp Source: Axillary (01/04 1215) BP: 119/74 (01/04 1135) Pulse Rate: 82 (01/04 1215)  Labs: Recent Labs    08/23/17 0314 08/24/17 0234 08/25/17 0042 08/25/17 0938 08/25/17 0941 08/25/17 1933  HGB  --  9.1*  --  9.2*  --   --   HCT  --  28.9*  --  29.5*  --   --   PLT  --  266  --  283  --   --   HEPARINUNFRC  --   --  0.31  --  0.18* 0.17*  CREATININE 0.85 0.83  --  0.77  --   --     Estimated Creatinine Clearance: 121.5 mL/min (by C-G formula based on SCr of 0.77 mg/dL).    Assessment: 89 yom admitted for evaluation of CHF and pulmonary edema. Left lower extremity duplex found evidence of acute DVT involving the posterior tibial veins of lower extremity. He was on DVT prophylaxis prior to duplex and transitioned to therapeutic dosing 1 mg/kg every 12 hours- last dose was on 1/3 at 0552.   Given valve surgery next week, will transition to IV heparin therapy.   Heparin level 0.17 still subtherapeutic after rate increase this morning. No intteruption with infusion, no issue with line. The initial low-end therapeutic level was likely d/t lovenox which is now cleared. Will increase heparin rate further and give a small bolus  Goal of Therapy:  Heparin level 0.3-0.7 units/ml Monitor platelets by anticoagulation protocol: Yes   Plan:  Increase heparin to 2100 units / hr 6 hour heparin level with AM labs  Thank you  Bayard HuggerMei Saintclair Schroader, PharmD, BCPS  Clinical Pharmacist  Pager: [phone]

## 2017-08-25 NOTE — Progress Notes (Signed)
Patient ID: Reginald [name], male   DOB: February 13, 1961, 57 y.o.   MRN: [id number]     Advanced Heart Failure Rounding Note  HF Cardiology: Shirlee Latch  Subjective:    Remains on IV heparin with DVT.   Feeling OK. Still having neck pain. Denies SOB, lightheadedness or dizziness.   Weight stable on po lasix.   MRI spine 08/24/17 with continued evidence of Osteomyelitis and joint infection in C1-C2, but positive response to treatment. Less regional edema and diminishing paraspinal fluid collections.  Less edema at C4-C5 level.  Objective:   Weight Range: 230 lb 11.2 oz (104.6 kg) Body mass index is 34.07 kg/m.   Vital Signs:   Temp:  [97.3 F (36.3 C)-98.6 F (37 C)] 98.2 F (36.8 C) (01/04 0733) Pulse Rate:  [80-88] 86 (01/04 1135) Resp:  [24-31] 25 (01/04 1135) BP: (107-124)/(71-92) 119/74 (01/04 1135) SpO2:  [91 %-99 %] 94 % (01/04 1135) Weight:  [230 lb 11.2 oz (104.6 kg)] 230 lb 11.2 oz (104.6 kg) (01/04 0429) Last BM Date: 08/24/16  Weight change: Filed Weights   08/23/17 0308 08/24/17 0218 08/25/17 0429  Weight: 231 lb 11.2 oz (105.1 kg) 229 lb 6.4 oz (104.1 kg) 230 lb 11.2 oz (104.6 kg)    Intake/Output:   Intake/Output Summary (Last 24 hours) at 08/25/2017 1149 Last data filed at 08/25/2017 0753 Gross per 24 hour  Intake 1227.81 ml  Output 900 ml  Net 327.81 ml    Physical Exam    General: Fatigued. NAD.  HEENT: Normal Neck: Supple. JVP 6-7. Carotids 2+ bilat; no bruits. No thyromegaly or nodule noted. Cor: PMI nondisplaced. RRR, No M/G/R noted Lungs: CTAB, normal effort. Abdomen: Soft, non-tender, non-distended, no HSM. No bruits or masses. +BS  Extremities: No cyanosis, clubbing, or rash. R and LLE no edema.  Neuro: Alert & orientedx3, cranial nerves grossly intact. moves all 4 extremities w/o difficulty. Affect pleasant   Telemetry   NSR 80s, personally reviewed.   Labs    CBC Recent Labs    08/24/17 0234 08/25/17 0938  WBC 7.5 8.3  NEUTROABS  --   5.5  HGB 9.1* 9.2*  HCT 28.9* 29.5*  MCV 87.3 88.3  PLT 266 283   Basic Metabolic Panel Recent Labs    40/98/11 0234 08/25/17 0938  NA 129* 129*  K 4.0 4.3  CL 90* 90*  CO2 31 28  GLUCOSE 138* 185*  BUN 18 15  CREATININE 0.83 0.77  CALCIUM 8.3* 8.5*  MG  --  1.7  PHOS  --  3.0   Liver Function Tests Recent Labs    08/25/17 0938  AST 29  ALT 24  ALKPHOS 191*  BILITOT 1.0  PROT 7.8  ALBUMIN 2.3*   No results for input(s): LIPASE, AMYLASE in the last 72 hours. Cardiac Enzymes No results for input(s): CKTOTAL, CKMB, CKMBINDEX, TROPONINI in the last 72 hours.  BNP: BNP (last 3 results) Recent Labs    07/26/17 0340 08/19/17 2300  BNP 104.6* 204.8*    ProBNP (last 3 results) No results for input(s): PROBNP in the last 8760 hours.   D-Dimer No results for input(s): DDIMER in the last 72 hours. Hemoglobin A1C No results for input(s): HGBA1C in the last 72 hours. Fasting Lipid Panel No results for input(s): CHOL, HDL, LDLCALC, TRIG, CHOLHDL, LDLDIRECT in the last 72 hours. Thyroid Function Tests No results for input(s): TSH, T4TOTAL, T3FREE, THYROIDAB in the last 72 hours.  Invalid input(s): FREET3  Other results:   Imaging  No results found.   Medications:     Scheduled Medications: . budesonide  0.25 mg Nebulization BID  . chlorhexidine  15 mL Mouth/Throat BID  . feeding supplement (GLUCERNA SHAKE)  237 mL Oral TID WC  . ferrous fumarate-b12-vitamic C-folic acid  1 capsule Oral TID PC  . furosemide  80 mg Oral Daily  . insulin aspart  0-15 Units Subcutaneous TID WC  . insulin glargine  14 Units Subcutaneous Daily  . lidocaine  1 patch Transdermal Q24H  . lisinopril  5 mg Oral Daily  . metoprolol tartrate  25 mg Oral BID  . potassium chloride  20 mEq Oral BID  . spironolactone  12.5 mg Oral Daily    Infusions: . sodium chloride    . DAPTOmycin (CUBICIN)  IV Stopped (08/24/17 1810)  . heparin 1,850 Units/hr (08/25/17 1135)    PRN  Medications: sodium chloride, acetaminophen, ipratropium-albuterol    Patient Profile   Reginald [name] is a 57 y.o. 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   [facility] distress and edema  Plan for potential MVR week of 1/7  Assessment/Plan   1. Acute on chronic diastolic CHF: In setting of severe MR.  Ultimately, he needs mitral valve replacement. - Volume status stable on 80 mg lasix daily.   - Continue 12.5 mg spiro daily. Pressures in 100s, but may consider increasing.  - Creatinine stable.  2. 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  Last blood cultures 12/25 NGTD.  Afebrile.  He continues on Taflaro and daptomycin.  Severe MR, suspect perforated leaflet.  Will need MVR.  - Tentatively plan surgery Tuesday, 08/29/17.  - Continue lisinopril to 5 mg daily for afterload reduction.  3. Anemia: Suspect anemia of chronic disease, got 1 unit PRBCs 12/31 - Hgb 9.2 this am.   - FOBT was recently negative. On Iron TID.  Transferrin saturation was low.  4. Deconditioning - PT recommends HH.  5. Hyponatremia  - Na 129. Continue free water restriction.  6. DVT: Left PT vein (below knee). - Continue IV heparin with upcoming surgery.   Length of Stay: 5  Reginald [name], [place]  08/25/2017, 11:49 AM  Advanced Heart Failure Team Pager [phone] (M-F; 7a - 4p)  Please contact [facility] for night-coverage after hours (4p -7a ) and weekends on [email or web address]  Patient seen with PA, agree with the above note.  Fatigued when  ambulates in hall.  Volume ok.    Will add an afternoon Lasix dose (40 mg qpm) to keep him net negative.   Continue heparin gtt, has below knee DVT on left.   Reginald [name] 08/25/2017

## 2017-08-25 NOTE — Progress Notes (Signed)
ANTICOAGULATION CONSULT NOTE   Pharmacy Consult for Heparin Indication: DVT  Allergies  Allergen Reactions  . Morphine And Related Other (See Comments)    "freaks out"    Patient Measurements: Height: 5\' 9"  (175.3 cm) Weight: 230 lb 11.2 oz (104.6 kg) IBW/kg (Calculated) : 70.7 Heparin Dosing Weight: 96.2 kg  Vital Signs: Temp: 98.2 F (36.8 C) (01/04 0733) Temp Source: Oral (01/04 0733) BP: 116/75 (01/04 0733) Pulse Rate: 82 (01/04 0733)  Labs: Recent Labs    08/23/17 0314 08/24/17 0234 08/25/17 0042 08/25/17 0938 08/25/17 0941  HGB  --  9.1*  --  9.2*  --   HCT  --  28.9*  --  29.5*  --   PLT  --  266  --  283  --   HEPARINUNFRC  --   --  0.31  --  0.18*  CREATININE 0.85 0.83  --  0.77  --     Estimated Creatinine Clearance: 121.5 mL/min (by C-G formula based on SCr of 0.77 mg/dL).    Assessment: [age] yom admitted for evaluation of CHF and pulmonary edema. Left lower extremity duplex found evidence of acute DVT involving the posterior tibial veins of lower extremity. He was on DVT prophylaxis prior to duplex and transitioned to therapeutic dosing 1 mg/kg every 12 hours- last dose was on 1/3 at 0552.   Given valve surgery next week, will transition to IV heparin therapy.   Heparin level now low  Goal of Therapy:  Heparin level 0.3-0.7 units/ml Monitor platelets by anticoagulation protocol: Yes   Plan:  Increase heparin to 1850 units / hr 8 hour heparin level  Thank you Okey RegalLisa Raylyn Speckman, PharmD [phone]

## 2017-08-25 NOTE — Progress Notes (Signed)
  Subjective: Doing well on IV heparin for lower extremity DVT without PE Maintaining sinus rhythm Able to ambulate in hallway with improvement Pulmonary edema improved Labs satisfactory Plan mitral valve replacement with bioprosthetic valve for severe MR post endocarditis--surgery a.m. January 8 I've discussed the procedure in detail with both the patient and his wife and they understand and agree to proceed.  Objective: Vital signs in last 24 hours: Temp:  [97.3 F (36.3 C)-98.7 F (37.1 C)] 98.2 F (36.8 C) (01/04 0733) Pulse Rate:  [80-88] 82 (01/04 0733) Cardiac Rhythm: Heart block (01/04 0713) Resp:  [18-31] 24 (01/04 0733) BP: (107-124)/(68-92) 116/75 (01/04 0733) SpO2:  [91 %-99 %] 99 % (01/04 0753) Weight:  [230 lb 11.2 oz (104.6 kg)] 230 lb 11.2 oz (104.6 kg) (01/04 0429)  Hemodynamic parameters for last 24 hours:    Intake/Output from previous day: 01/03 0701 - 01/04 0700 In: 987.8 [P.O.:360; I.V.:147.8; IV Piggyback:480] Out: 700 [Urine:700] Intake/Output this shift: No intake/output data recorded.       Exam    General- alert and comfortable   Lungs- clear without rales, wheezes   Cor- regular rate and rhythm, 3/6 MR murmur ,  No gallop   Abdomen- soft, non-tender   Extremities - warm, non-tender, minimal edema   Neuro- oriented, appropriate, no focal weakness   Lab Results: Recent Labs    08/24/17 0234  WBC 7.5  HGB 9.1*  HCT 28.9*  PLT 266   BMET:  Recent Labs    08/23/17 0314 08/24/17 0234  NA 129* 129*  K 3.8 4.0  CL 88* 90*  CO2 32 31  GLUCOSE 153* 138*  BUN 18 18  CREATININE 0.85 0.83  CALCIUM 8.4* 8.3*    PT/INR: No results for input(s): LABPROT, INR in the last 72 hours. ABG    Component Value Date/Time   PHART 7.417 08/20/2017 0130   HCO3 26.6 08/20/2017 0130   TCO2 31 08/02/2017 1553   O2SAT 100.0 08/20/2017 0130   CBG (last 3)  Recent Labs    08/24/17 1628 08/24/17 2112 08/25/17 0623  GLUCAP 169* 153* 150*     Assessment/Plan: S/P  MVR for endocarditis January 8.   LOS: 5 days    Reginald [name] 08/25/2017

## 2017-08-25 NOTE — Progress Notes (Signed)
PROGRESS NOTE    Reginald [name]  ZOX:[id number] DOB: [birth year] DOA: 08/20/2017 PCP: Patient, No Pcp Per   Brief Narrative:  Reginald [name] is a 57 y.o. male with medical history significant of osteomyelitis of the spine, alcohol abuse, type 2 diabetes, paraspinal abscess, tobacco abuse, nonobstructive CAD, mitral regurgitation who was recently admitted from 07/25/2017 to 08/10/2017 due to mitral valve large vegetation/endocarditis, possible aortic valve endocarditis, septic emboli to the brain, multifocal pneumonia, C1 osteomyelitis, C4 discitis and paraspinal abscess due to MRSA bacteremia.  He was discharged to the rehab unit where he has been continuing the Daptomycin IVPB regimen for a total of 6 weeks. He was admitted for Acute Respiratory Failure from CHF Exacerbation and Heart Failure Team has been following. Cardiothoracic Surgery Dr. Donata Clay following and planning to take patient for MVR with Bioprosthetic Valve 08/29/17. Patient found to have an acute DVT and now on Heparin gtt.   Assessment & Plan:   Principal Problem:   Acute respiratory failure (HCC) Active Problems:   MRSA bacteremia   Mitral valve vegetation   Acute diastolic CHF (congestive heart failure) (HCC)   Endocarditis of mitral valve   Cerebral septic emboli (HCC)   Hyponatremia   Essential hypertension   Type 2 diabetes mellitus with peripheral neuropathy (HCC)  Acute respiratory failure (HCC) likely from CHF Exacerbation -Likely secondary to CHF exacerbation in the setting of severe mitral valve regurgiation -He was sent from inpatient rehab center to hospital and admitted to stepdown, he is started on iv lasix/metolazone and put on bipap,  -Much improved; per Cards, d/c metolazone d/ed, change bipap to prn -Heart Failure Team consulted, meds per heart failure team -Per Cardiology C/w Lasix 80 mg po Daily and they are adding po Lasix qEvening; Adding Spironolactone 12.5 mg po Daily   Bilateral lower  extremity edema with Left worse than Right 2/2 Acute DVT; -Left > right -Venous doppler showed acute DVT involving the posterior tibial veins of the LLE -Given full dose Lovenox and changed to IV Heparin gtt -Dg x ray left foot "Fracture proximal aspect of fifth proximal -phalanx, stable from 1 week prior. Minimal healing response in this area. No other fracture. No dislocation" -Chest CTA showed No evidence of significant pulmonary embolus. Dilated ascending thoracic aorta at 4.4 cm.  Diffuse patchy airspace disease throughout both lungs, progressing since previous study. This could represent edema or multifocal pneumonia. Small bilateral pleural effusions with basilar atelectasis. Secretions versus mucous plugging in the lower lobe bronchioles. Aortic atherosclerosis.  Coronary artery calcification. -C/w Heparin gtt   MRSA bacteremia  MV endocarditis, Large vegetation Possible AV endocarditis CNS, septic emboli to brain Possible multifocal pneumonia on CT C1 vertebral osteo, C4 discitis, paraspinal abscess -He was hospitalized from  12/4 to 12/20 for this , he was discharged to rehab on iv abx ( Daptomycin) in anticipating cardiac thoracic surgery once he regain some strength. -Blood culture has been negative since 12/13 -Per last infectious disease note on 12/20 by Dr Zenaida Niece dam:  C/w Monotherapy Daptomycin through Surgery and 6 weeks after and send valve tissue for culture from [facility] Cervical spine osteo and paraspinal abscess -Repeat MRI Done w/o Contrast showed continued evidence of osteomyelitis and joint infection in the C1-2 region, with evidence of positive response to treatment since the study of 08/08/2017.There is less regional edema and the small paraspinous fluid collections are diminishing. No progressive or worsening findings. There is also less edema at the C4-5 level.No worsening or new finding. -Dr. Zenaida Niece  Dam recommended getting a repeat MRI prior to Stopping IV Abx -Monitor CK  weekly. Last one was 36 -Continue Daptomycin for now -Thoracic surgery consulted, will follow recommendation and plan is for MVR with Bioprosthetic Valve on 08/29/16  Vertebral Osteomyelitis -This was noted on MRI C-spine ordered by ID. There was osteomyelitis of the odontoid process. My Colleague discussed Case with Neurosurgery who reviewed films during last hospitalization, recommended repeat MRI C-spine in 4-6 weeks (Jan 10-20th roughly). -However, in light of neck hyperextension for anesthesia, would recommend repeat MRI imaging before surgery -MRI  cervical spine ordered on 1/2 and done 1/3 showed The patient has osteomyelitis at the C1-2 level with septic arthritis of the skullbase C1 articulation and C1-C2 articulation. Today's study was ordered and performed without contrast, hindering evaluation to some degree. I think there is slightly less edema in the region and I think that paraspinous fluid collections are smaller. I do not see any progressive disease or new finding. No cord compression. Mild edema at the C4-5 level also appears slightly improved without progression or new finding. Chronic discogenic endplate changes at C5-6 appear the same. -Will Consider Repeating MRI with Contrast prior to Surgery on 08/29/16 but after discussion with ID Dr. Luciana Axe will hold off for now.   HTN:  -C/w Metoprolol 25 mg po BID, Lisinopril 5 mg po Daily, and Furosemide 80 mg po Daily   Diabetes: -He was started on insulin during last hospitalization, prior to that he was not on insulin -Hba1c 5.8 ( may not be reliable due to anemia, hgb 7.6) -C/w Lantus 14 units sq Daily and with Moderate Novolog SSI AC -CBG's ranging from 179-187  Normocytic Anemia -No sign of bleeding -Transfused prbc x1 on 12/31 -Hb/Hct was 9.1/28.9  And went to 9.2/29.5 -Continue to Monitor for S/Sx of Bleeding as patient is on Heparin gtt -Repeat CBC in AM  H/o Alcohol Use -Has alcohol delirum needing precedex drip  and intubation during last hospitalization. -Not an acute issue since he has been at rehab since last  Discharge on 12/20.   Hyponatremia  -Patient's Na+ was 129 -Likley from Diuresis/Volume Overload -Continue to Monitor and Repeat in AM   DVT prophylaxis: Anticoagulated with Heparin gtt Code Status: FULL CODE Family Communication: No Family present at bedside Disposition Plan: Remain Inpatient for anticipated Surgery   Consultants:   Dr. Cindra Eves  Cardiology Dr. Marca Ancona  Cardiothoracic Surgery Dr. Donata Clay    Procedures:  None   Antimicrobials:  Anti-infectives (From admission, onward)   Start     Dose/Rate Route Frequency Ordered Stop   08/20/17 1500  DAPTOmycin (CUBICIN) 1,000 mg in sodium chloride 0.9 % IVPB     1,000 mg 240 mL/hr over 30 Minutes Intravenous Every 24 hours 08/20/17 0117 09/11/17 1829   08/20/17 1000  ceftaroline (TEFLARO) 600 mg in sodium chloride 0.9 % 250 mL IVPB  Status:  Discontinued     600 mg 250 mL/hr over 60 Minutes Intravenous Every 12 hours 08/20/17 0451 08/23/17 1253     Subjective: Seen and examined and had no complaints. No CP or SOB. No nausea or vomiting had some neck pain yesterday but improved. No other concerns or complaints. Understands procedure will be done on Tuesday.   Objective: Vitals:   08/25/17 0008 08/25/17 0429 08/25/17 0733 08/25/17 0753  BP: 111/82 (!) 121/92 116/75   Pulse: 82 88 82   Resp: (!) 29 (!) 27 (!) 24   Temp:  98.6 F (37 C)  98.2 F (36.8 C)   TempSrc:  Oral Oral   SpO2: 95% 91%  99%  Weight:  104.6 kg (230 lb 11.2 oz)    Height:        Intake/Output Summary (Last 24 hours) at 08/25/2017 0819 Last data filed at 08/25/2017 1610 Gross per 24 hour  Intake 987.81 ml  Output 700 ml  Net 287.81 ml   Filed Weights   08/23/17 0308 08/24/17 0218 08/25/17 0429  Weight: 105.1 kg (231 lb 11.2 oz) 104.1 kg (229 lb 6.4 oz) 104.6 kg (230 lb 11.2 oz)   Examination: Physical  Exam:  Constitutional: WN/WD Caucasian Male in NAD appears Calm  Eyes: Sclerae anicteric. Lids normal ENMT: External Ears and Nose appear Normal. MMM Neck: Supple with no JVD Respiratory: Diminished to auscultation. No appreciable wheezing/rales/rhonchi Cardiovascular: RRR; Has a slight Systolic murmur appreciated. No appreciable LE edema Abdomen: Soft, NT, ND. Bowel sounds present GU: Deferred Musculoskeletal: No contractures; No cyanosis Skin: Warm and Dry. No appreciable rashes or lesions on a limited skin eval  Neurologic: CN 2-12 grossly intact. No appreciable focal deficits Psychiatric: Normal mood and affect. Intact judgement and insight  Data Reviewed: I have personally reviewed following labs and imaging studies  CBC: Recent Labs  Lab 08/20/17 0326 08/21/17 0510 08/24/17 0234  WBC 10.5 7.2 7.5  NEUTROABS 8.2*  --   --   HGB 7.8* 7.6* 9.1*  HCT 25.0* 24.1* 28.9*  MCV 90.6 88.9 87.3  PLT 262 252 266   Basic Metabolic Panel: Recent Labs  Lab 08/19/17 2300 08/21/17 0510 08/22/17 0211 08/23/17 0314 08/24/17 0234  NA 130* 131* 130* 129* 129*  K 4.9 2.8* 3.7 3.8 4.0  CL 97* 90* 86* 88* 90*  CO2 22 32 34* 32 31  GLUCOSE 182* 169* 207* 153* 138*  BUN 17 18 18 18 18   CREATININE 0.86 0.88 0.91 0.85 0.83  CALCIUM 8.4* 8.5* 8.6* 8.4* 8.3*  MG  --  1.6* 2.1  --   --    GFR: Estimated Creatinine Clearance: 117.1 mL/min (by C-G formula based on SCr of 0.83 mg/dL). Liver Function Tests: No results for input(s): AST, ALT, ALKPHOS, BILITOT, PROT, ALBUMIN in the last 168 hours. No results for input(s): LIPASE, AMYLASE in the last 168 hours. No results for input(s): AMMONIA in the last 168 hours. Coagulation Profile: No results for input(s): INR, PROTIME in the last 168 hours. Cardiac Enzymes: Recent Labs  Lab 08/21/17 0510  CKTOTAL 36*   BNP (last 3 results) No results for input(s): PROBNP in the last 8760 hours. HbA1C: No results for input(s): HGBA1C in the last  72 hours. CBG: Recent Labs  Lab 08/24/17 0612 08/24/17 1151 08/24/17 1628 08/24/17 2112 08/25/17 0623  GLUCAP 147* 155* 169* 153* 150*   Lipid Profile: No results for input(s): CHOL, HDL, LDLCALC, TRIG, CHOLHDL, LDLDIRECT in the last 72 hours. Thyroid Function Tests: No results for input(s): TSH, T4TOTAL, FREET4, T3FREE, THYROIDAB in the last 72 hours. Anemia Panel: No results for input(s): VITAMINB12, FOLATE, FERRITIN, TIBC, IRON, RETICCTPCT in the last 72 hours. Sepsis Labs: No results for input(s): PROCALCITON, LATICACIDVEN in the last 168 hours.  Recent Results (from the past 240 hour(s))  Culture, blood (Routine X 2) w Reflex to ID Panel     Status: None   Collection Time: 08/16/17  6:00 AM  Result Value Ref Range Status   Specimen Description BLOOD LEFT ANTECUBITAL  Final   Special Requests   Final    BOTTLES  DRAWN AEROBIC AND ANAEROBIC Blood Culture adequate volume   Culture NO GROWTH 5 DAYS  Final   Report Status 08/21/2017 FINAL  Final  Culture, blood (Routine X 2) w Reflex to ID Panel     Status: None   Collection Time: 08/16/17  2:55 PM  Result Value Ref Range Status   Specimen Description BLOOD LEFT ANTECUBITAL  Final   Special Requests   Final    BOTTLES DRAWN AEROBIC ONLY Blood Culture adequate volume   Culture NO GROWTH 5 DAYS  Final   Report Status 08/21/2017 FINAL  Final    Radiology Studies: Ct Angio Chest Pe W Or Wo Contrast  Result Date: 08/23/2017 CLINICAL DATA:  Acute respiratory failure and hypoxia. Lower extremity swelling for 3 days. EXAM: CT ANGIOGRAPHY CHEST WITH CONTRAST TECHNIQUE: Multidetector CT imaging of the chest was performed using the standard protocol during bolus administration of intravenous contrast. Multiplanar CT image reconstructions and MIPs were obtained to evaluate the vascular anatomy. CONTRAST:  ISOVUE-370 IOPAMIDOL (ISOVUE-370) INJECTION 76% COMPARISON:  08/01/2017 FINDINGS: Cardiovascular: Good opacification of the  central and segmental pulmonary arteries. No focal filling defects. No evidence of significant pulmonary embolus. Normal heart size. No pericardial effusion. Coronary artery calcifications. Calcification of the aorta. Dilated ascending thoracic aorta at 4.4 cm. Mediastinum/Nodes: Prominent mediastinal lymph nodes without pathologic enlargement, likely reactive. Esophagus is decompressed. Thyroid gland is atrophic. Lungs/Pleura: Diffuse patchy airspace disease throughout both lungs. This may represent edema or multifocal pneumonia. Small bilateral pleural effusions with basilar atelectasis. Infiltrates have progressed since previous study. Effusions are unchanged. No pneumothorax. Secretions demonstrated within some of the basal bronchioles, possibly representing inflammatory secretions or mucous plugging. Upper Abdomen: No acute abnormality. Musculoskeletal: Degenerative changes in the spine. No destructive bone lesions. Review of the MIP images confirms the above findings. IMPRESSION: 1. No evidence of significant pulmonary embolus. 2. Dilated ascending thoracic aorta at 4.4 cm. Recommend annual imaging followup by CTA or MRA. This recommendation follows 2010 ACCF/AHA/AATS/ACR/ASA/SCA/SCAI/SIR/STS/SVM Guidelines for the Diagnosis and Management of Patients with Thoracic Aortic Disease. Circulation. 2010; 121: U981-X914 3. Diffuse patchy airspace disease throughout both lungs, progressing since previous study. This could represent edema or multifocal pneumonia. Small bilateral pleural effusions with basilar atelectasis. Secretions versus mucous plugging in the lower lobe bronchioles. 4. Aortic atherosclerosis.  Coronary artery calcification. Aortic Atherosclerosis (ICD10-I70.0). Aortic aneurysm NOS (ICD10-I71.9). Electronically Signed   By: Burman Nieves M.D.   On: 08/23/2017 23:49   Mr Cervical Spine Wo Contrast  Result Date: 08/24/2017 CLINICAL DATA:  Followup spinal infection EXAM: MRI CERVICAL SPINE  WITHOUT CONTRAST TECHNIQUE: Multiplanar, multisequence MR imaging of the cervical spine was performed. No intravenous contrast was administered. COMPARISON:  MRI 08/08/2017.  MRI 06/2028 2018.  CT 07/20/2017. FINDINGS: Alignment: No change in alignment. Vertebrae: The patient has osteomyelitis at the C1-2 level with septic arthritis of the skullbase C1 articulation and C1-C2 articulation. Today's study was ordered and performed without contrast, hindering evaluation to some degree. I think there is slightly less edema in the region and I think that paraspinous fluid collections are smaller. I do not see any progressive disease or new finding. No cord compression. Mild edema at the C4-5 level also appears slightly improved without progression or new finding. Chronic discogenic endplate changes at C5-6 appear the same. Cord: No cord compression or primary cord lesion. Posterior Fossa, vertebral arteries, paraspinal tissues: As discussed above. Disc levels: As discussed above. IMPRESSION: Today's study was done without contrast. Continued evidence of osteomyelitis and joint  infection in the C1-2 region, with evidence of positive response to treatment since the study of 08/08/2017. There is less regional edema and the small paraspinous fluid collections are diminishing. No progressive or worsening findings. There is also less edema at the C4-5 level. No worsening or new finding. Electronically Signed   By: Paulina FusiMark  Shogry M.D.   On: 08/24/2017 10:06   Scheduled Meds: . budesonide  0.25 mg Nebulization BID  . chlorhexidine  15 mL Mouth/Throat BID  . feeding supplement (GLUCERNA SHAKE)  237 mL Oral TID WC  . ferrous fumarate-b12-vitamic C-folic acid  1 capsule Oral TID PC  . furosemide  80 mg Oral Daily  . insulin aspart  0-15 Units Subcutaneous TID WC  . insulin glargine  14 Units Subcutaneous Daily  . lidocaine  1 patch Transdermal Q24H  . lisinopril  5 mg Oral Daily  . metoprolol tartrate  25 mg Oral BID  .  potassium chloride  20 mEq Oral BID  . spironolactone  12.5 mg Oral Daily   Continuous Infusions: . sodium chloride    . DAPTOmycin (CUBICIN)  IV Stopped (08/24/17 1810)  . heparin 1,650 Units/hr (08/25/17 0729)    LOS: 5 days   Merlene Laughtermair Latif Dilara Navarrete, DO [facility] Pager [phone]  If 7PM-7AM, please contact night-coverage [email or web address] Password [email or web address] 08/25/2017, 8:19 AM

## 2017-08-25 NOTE — Progress Notes (Signed)
ANTICOAGULATION CONSULT NOTE   Pharmacy Consult for Heparin Indication: DVT  Allergies  Allergen Reactions  . Morphine And Related Other (See Comments)    "freaks out"    Patient Measurements: Height: 5\' 9"  (175.3 cm) Weight: 229 lb 6.4 oz (104.1 kg) IBW/kg (Calculated) : 70.7 Heparin Dosing Weight: 96.2 kg  Vital Signs: Temp: 98.6 F (37 C) (01/03 2113) Temp Source: Oral (01/03 2113) BP: 111/82 (01/04 0008) Pulse Rate: 82 (01/04 0008)  Labs: Recent Labs    08/23/17 0314 08/24/17 0234 08/25/17 0042  HGB  --  9.1*  --   HCT  --  28.9*  --   PLT  --  266  --   HEPARINUNFRC  --   --  0.31  CREATININE 0.85 0.83  --     Estimated Creatinine Clearance: 116.8 mL/min (by C-G formula based on SCr of 0.83 mg/dL).   Medical History: Past Medical History:  Diagnosis Date  . Acute osteomyelitis of cervical spine (HCC) 08/10/2017  . Alcohol abuse   . CAD (coronary artery disease)   . Diabetes mellitus type 2 in obese (HCC)   . Mitral regurgitation   . Neck pain   . 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 Tobacco abuse     Medications:  Scheduled:  . budesonide  0.25 mg Nebulization BID  . chlorhexidine  15 mL Mouth/Throat BID  . feeding supplement (GLUCERNA SHAKE)  237 mL Oral TID WC  . ferrous fumarate-b12-vitamic C-folic acid  1 capsule Oral TID PC  . furosemide  80 mg Oral Daily  . insulin aspart  0-15 Units Subcutaneous TID WC  . insulin glargine  14 Units Subcutaneous Daily  . lidocaine  1 patch Transdermal Q24H  . lisinopril  5 mg Oral Daily  . metoprolol tartrate  25 mg Oral BID  . potassium chloride  20 mEq Oral BID  . spironolactone  12.5 mg Oral Daily    Assessment: 56 yom admitted for evaluation of CHF and pulmonary edema. Left lower extremity duplex found evidence of acute DVT involving the posterior tibial veins of lower extremity. He was on DVT prophylaxis prior to duplex and transitioned to therapeutic dosing 1 mg/kg every 12 hours- last dose was  on 1/3 at 0552.   Given valve surgery next week, will transition to IV heparin therapy.   1/4 AM: initial heparin level on low end of therapeutic range  Goal of Therapy:  Heparin level 0.3-0.7 units/ml Monitor platelets by anticoagulation protocol: Yes   Plan:  -Inc heparin to 1650 units/hr to prevent sub-therapeutic level in the setting of active VTE -1000 HL  Abran DukeJames Nathanyal Ashmead, PharmD, BCPS Clinical Pharmacist Phone: [phone]

## 2017-08-26 LAB — CBC WITH DIFFERENTIAL/PLATELET
BASOS PCT: 1 %
Basophils Absolute: 0.1 10*3/uL (ref 0.0–0.1)
EOS ABS: 0.4 10*3/uL (ref 0.0–0.7)
EOS PCT: 5 %
HCT: 28.4 % — ABNORMAL LOW (ref 39.0–52.0)
Hemoglobin: 8.8 g/dL — ABNORMAL LOW (ref 13.0–17.0)
LYMPHS ABS: 2.5 10*3/uL (ref 0.7–4.0)
Lymphocytes Relative: 30 %
MCH: 27.2 pg (ref 26.0–34.0)
MCHC: 31 g/dL (ref 30.0–36.0)
MCV: 87.7 fL (ref 78.0–100.0)
MONO ABS: 0.4 10*3/uL (ref 0.1–1.0)
MONOS PCT: 5 %
Neutro Abs: 4.9 10*3/uL (ref 1.7–7.7)
Neutrophils Relative %: 59 %
PLATELETS: 273 10*3/uL (ref 150–400)
RBC: 3.24 MIL/uL — ABNORMAL LOW (ref 4.22–5.81)
RDW: 16.3 % — AB (ref 11.5–15.5)
WBC: 8.2 10*3/uL (ref 4.0–10.5)

## 2017-08-26 LAB — GLUCOSE, CAPILLARY
GLUCOSE-CAPILLARY: 140 mg/dL — AB (ref 65–99)
GLUCOSE-CAPILLARY: 112 mg/dL — AB (ref 65–99)
Glucose-Capillary: 182 mg/dL — ABNORMAL HIGH (ref 65–99)
Glucose-Capillary: 249 mg/dL — ABNORMAL HIGH (ref 65–99)

## 2017-08-26 LAB — HEPARIN LEVEL (UNFRACTIONATED)
HEPARIN UNFRACTIONATED: 0.28 [IU]/mL — AB (ref 0.30–0.70)
HEPARIN UNFRACTIONATED: 0.23 [IU]/mL — AB (ref 0.30–0.70)
Heparin Unfractionated: 0.21 IU/mL — ABNORMAL LOW (ref 0.30–0.70)

## 2017-08-26 LAB — COMPREHENSIVE METABOLIC PANEL
ALK PHOS: 205 U/L — AB (ref 38–126)
ALT: 26 U/L (ref 17–63)
ANION GAP: 7 (ref 5–15)
AST: 25 U/L (ref 15–41)
Albumin: 2.3 g/dL — ABNORMAL LOW (ref 3.5–5.0)
BUN: 15 mg/dL (ref 6–20)
CALCIUM: 8.5 mg/dL — AB (ref 8.9–10.3)
CO2: 30 mmol/L (ref 22–32)
Chloride: 92 mmol/L — ABNORMAL LOW (ref 101–111)
Creatinine, Ser: 0.79 mg/dL (ref 0.61–1.24)
GFR calc non Af Amer: >60 mL/min (ref >60)
Glucose, Bld: 156 mg/dL — ABNORMAL HIGH (ref 65–99)
POTASSIUM: 4.4 mmol/L (ref 3.5–5.1)
SODIUM: 129 mmol/L — AB (ref 135–145)
Total Bilirubin: 0.9 mg/dL (ref 0.3–1.2)
Total Protein: 7.9 g/dL (ref 6.5–8.1)

## 2017-08-26 LAB — PHOSPHORUS: Phosphorus: 3.8 mg/dL (ref 2.5–4.6)

## 2017-08-26 LAB — MAGNESIUM: Magnesium: 1.6 mg/dL — ABNORMAL LOW (ref 1.7–2.4)

## 2017-08-26 MED ORDER — MAGNESIUM SULFATE 2 GM/50ML IV SOLN
2.0000 g | Freq: Once | INTRAVENOUS | Status: AC
  Administered 2017-08-26: 2 g via INTRAVENOUS
  Filled 2017-08-26 (×2): qty 50

## 2017-08-26 NOTE — Progress Notes (Signed)
ANTICOAGULATION CONSULT NOTE   Pharmacy Consult for Heparin Indication: DVT  Allergies  Allergen Reactions  . Morphine And Related Other (See Comments)    "freaks out"    Patient Measurements: Height: 5\' 9"  (175.3 cm) Weight: 230 lb 11.2 oz (104.6 kg) IBW/kg (Calculated) : 70.7 Heparin Dosing Weight: 96.2 kg  Vital Signs: Temp: 98.1 F (36.7 C) (01/05 1205) Temp Source: Oral (01/05 1205) BP: 101/68 (01/05 1205) Pulse Rate: 81 (01/05 1205)  Labs: Recent Labs    08/24/17 0234  08/25/17 0938  08/25/17 1933 08/26/17 0217 08/26/17 1031  HGB 9.1*  --  9.2*  --   --  8.8*  --   HCT 28.9*  --  29.5*  --   --  28.4*  --   PLT 266  --  283  --   --  273  --   HEPARINUNFRC  --    < >  --    < > 0.17* 0.28* 0.23*  CREATININE 0.83  --  0.77  --   --  0.79  --    < > = values in this interval not displayed.    Estimated Creatinine Clearance: 121.5 mL/min (by C-G formula based on SCr of 0.79 mg/dL).   Medical History: Past Medical History:  Diagnosis Date  . Acute osteomyelitis of cervical spine (HCC) 08/10/2017  . Alcohol abuse   . CAD (coronary artery disease)   . Diabetes mellitus type 2 in obese (HCC)   . Mitral regurgitation   . Neck pain   . 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 Tobacco abuse     Medications:  Scheduled:  . budesonide  0.25 mg Nebulization BID  . chlorhexidine  15 mL Mouth/Throat BID  . feeding supplement (GLUCERNA SHAKE)  237 mL Oral TID WC  . ferrous fumarate-b12-vitamic C-folic acid  1 capsule Oral TID PC  . furosemide  40 mg Oral QPM  . furosemide  80 mg Oral Daily  . insulin aspart  0-15 Units Subcutaneous TID WC  . insulin glargine  14 Units Subcutaneous Daily  . lidocaine  1 patch Transdermal Q24H  . lisinopril  5 mg Oral Daily  . metoprolol tartrate  25 mg Oral BID  . potassium chloride  20 mEq Oral BID  . spironolactone  12.5 mg Oral Daily    Assessment: 56 yom admitted for evaluation of CHF and pulmonary edema. Left lower  extremity duplex found evidence of acute DVT involving the posterior tibial veins of lower extremity. He was on DVT prophylaxis prior to duplex and transitioned to therapeutic dosing 1 mg/kg every 12 hours- last dose was on 1/3 at 0552.   Given valve surgery next week, will transition to IV heparin therapy.   Heparin level: 0.23, RN reports that pump was beeping when she went in room earlier this morning near shift change, thinks the pump may have been beeping for up to 3 hours. No overt bleeding reported. Will follow up with another lab later as the level was trending up appropriately prior to pump issue.   Goal of Therapy:  Heparin level 0.3-0.7 units/ml Monitor platelets by anticoagulation protocol: Yes   Plan:  -Continue heparin gtt at 2250 units/hr -1900 heparin level -Monitor for s/sx of bleeding  [name], PharmD Clinical Pharmacist 08/26/2017 12:11 PM

## 2017-08-26 NOTE — Plan of Care (Signed)
  Progressing Activity: Capacity to carry out activities will improve 08/26/2017 2307 - Progressing by Ruel FavorsBrown-Staples, Raphel Stickles, RN Cardiac: Ability to achieve and maintain adequate cardiopulmonary perfusion will improve 08/26/2017 2307 - Progressing by Ruel FavorsBrown-Staples, Amillion Macchia, RN Coping: Ability to identify and develop effective coping behavior will improve 08/26/2017 2307 - Progressing by Ruel FavorsBrown-Staples, Marieelena Bartko, RN Self-Concept: Ability to identify factors that promote anxiety will improve 08/26/2017 2307 - Progressing by Ruel FavorsBrown-Staples, Jem Castro, RN Level of anxiety will decrease 08/26/2017 2307 - Progressing by Ruel FavorsBrown-Staples, Jhostin Epps, RN Ability to modify response to factors that promote anxiety will improve 08/26/2017 2307 - Progressing by Ruel FavorsBrown-Staples, Vaiden Adames, RN Education: Knowledge of General Education information will improve 08/26/2017 2307 - Progressing by Ruel FavorsBrown-Staples, Soliyana Mcchristian, RN Health Behavior/Discharge Planning: Ability to manage health-related needs will improve 08/26/2017 2307 - Progressing by Ruel FavorsBrown-Staples, Toba Claudio, RN Clinical Measurements: Ability to maintain clinical measurements within normal limits will improve 08/26/2017 2307 - Progressing by Ruel FavorsBrown-Staples, Gentry Pilson, RN Will remain free from infection 08/26/2017 2307 - Progressing by Ruel FavorsBrown-Staples, Joellyn Grandt, RN Diagnostic test results will improve 08/26/2017 2307 - Progressing by Ruel FavorsBrown-Staples, Jacquline Terrill, RN Respiratory complications will improve 08/26/2017 2307 - Progressing by Ruel FavorsBrown-Staples, Brendan Gadson, RN Cardiovascular complication will be avoided 08/26/2017 2307 - Progressing by Ruel FavorsBrown-Staples, Jaleal Schliep, RN Skin Integrity: Risk for impaired skin integrity will decrease 08/26/2017 2307 - Progressing by Ruel FavorsBrown-Staples, Jessamyn Watterson, RN Metabolic: Ability to maintain appropriate glucose levels will improve 08/26/2017 2307 - Progressing by Ruel FavorsBrown-Staples, Seleen Walter, RN Phase I Progression Outcomes Initial discharge plan identified 08/26/2017 2307 -  Progressing by Ruel FavorsBrown-Staples, Dashonda Bonneau, RN Hemodynamically stable 08/26/2017 2307 - Progressing by Ruel FavorsBrown-Staples, Preet Perrier, RN Other Phase I Outcomes/Goals 08/26/2017 2307 - Progressing by Ruel FavorsBrown-Staples, Jayleen Afonso, RN Phase II Progression Outcomes Therapeutic drug levels for anticoagulation 08/26/2017 2307 - Progressing by Ruel FavorsBrown-Staples, Delynda Sepulveda, RN Discharge plan established 08/26/2017 2307 - Progressing by Ruel FavorsBrown-Staples, Keylah Darwish, RN Other Phase II Outcomes/Goals 08/26/2017 2307 - Progressing by Ruel FavorsBrown-Staples, Anahi Belmar, RN Phase III Progression Outcomes 02 sats stabilized 08/26/2017 2307 - Progressing by Ruel FavorsBrown-Staples, Salayah Meares, RN Activity at appropriate level-compared to baseline Description (UP IN CHAIR FOR HEMODIALYSIS) 08/26/2017 2307 - Progressing by Ruel FavorsBrown-Staples, Wanda Cellucci, RN Discharge plan remains appropriate-arrangements made 08/26/2017 2307 - Progressing by Ruel FavorsBrown-Staples, Wandalee Klang, RN Other Phase III Outcomes/Goals 08/26/2017 2307 - Progressing by Ruel FavorsBrown-Staples, Brizeida Mcmurry, RN Discharge Progression Outcomes Barriers To Progression Addressed/Resolved 08/26/2017 2307 - Progressing by Ruel FavorsBrown-Staples, Rayaan Garguilo, RN Discharge plan in place and appropriate 08/26/2017 2307 - Progressing by Ruel FavorsBrown-Staples, Cammi Consalvo, RN Hemodynamically stable 08/26/2017 2307 - Progressing by Ruel FavorsBrown-Staples, Zameer Borman, RN Complications resolved/controlled 08/26/2017 2307 - Progressing by Ruel FavorsBrown-Staples, Angeli Demilio, RN Activity appropriate for discharge plan 08/26/2017 2307 - Progressing by Ruel FavorsBrown-Staples, Chiante Peden, RN

## 2017-08-26 NOTE — Progress Notes (Signed)
Progress Note  Patient Name: [name] Kail Date of Encounter: 08/26/2017  Primary Cardiologist: Chrystie NoseKenneth C Hilty, MD   Subjective   No complaints  Inpatient Medications    Scheduled Meds: . budesonide  0.25 mg Nebulization BID  . chlorhexidine  15 mL Mouth/Throat BID  . feeding supplement (GLUCERNA SHAKE)  237 mL Oral TID WC  . ferrous fumarate-b12-vitamic C-folic acid  1 capsule Oral TID PC  . furosemide  40 mg Oral QPM  . furosemide  80 mg Oral Daily  . insulin aspart  0-15 Units Subcutaneous TID WC  . insulin glargine  14 Units Subcutaneous Daily  . lidocaine  1 patch Transdermal Q24H  . lisinopril  5 mg Oral Daily  . metoprolol tartrate  25 mg Oral BID  . potassium chloride  20 mEq Oral BID  . spironolactone  12.5 mg Oral Daily   Continuous Infusions: . sodium chloride    . DAPTOmycin (CUBICIN)  IV Stopped (08/25/17 1800)  . heparin 2,250 Units/hr (08/26/17 0854)  . magnesium sulfate 1 - 4 g bolus IVPB     PRN Meds: sodium chloride, acetaminophen, ipratropium-albuterol   Vital Signs    Vitals:   08/25/17 2112 08/26/17 0431 08/26/17 0835 08/26/17 1025  BP: 104/70 (!) 94/56 (!) 99/57   Pulse: 84 84 70 80  Resp: (!) 30 18  18   Temp: 98.9 F (37.2 C) 98.3 F (36.8 C)    TempSrc: Oral Oral    SpO2: 94% 97%  96%  Weight:  230 lb 11.2 oz (104.6 kg)    Height:        Intake/Output Summary (Last 24 hours) at 08/26/2017 1146 Last data filed at 08/25/2017 1200 Gross per 24 hour  Intake 540 ml  Output -  Net 540 ml   Filed Weights   08/24/17 0218 08/25/17 0429 08/26/17 0431  Weight: 229 lb 6.4 oz (104.1 kg) 230 lb 11.2 oz (104.6 kg) 230 lb 11.2 oz (104.6 kg)    Telemetry    SR - Personally Reviewed  ECG    n/a  Physical Exam   GEN: No acute distress.   Neck: No JVD Cardiac: RRR, 2.6 systolic murmur at apex,no rubs, or gallops.  Respiratory: Clear to auscultation bilaterally. GI: Soft, nontender, non-distended  MS: No edema; No  deformity. Neuro:  Nonfocal  Psych: Normal affect   Labs    Chemistry Recent Labs  Lab 08/24/17 0234 08/25/17 0938 08/26/17 0217  NA 129* 129* 129*  K 4.0 4.3 4.4  CL 90* 90* 92*  CO2 31 28 30   GLUCOSE 138* 185* 156*  BUN 18 15 15   CREATININE 0.83 0.77 0.79  CALCIUM 8.3* 8.5* 8.5*  PROT  --  7.8 7.9  ALBUMIN  --  2.3* 2.3*  AST  --  29 25  ALT  --  24 26  ALKPHOS  --  191* 205*  BILITOT  --  1.0 0.9  GFRNONAA >60 >60 >60  GFRAA >60 >60 >60  ANIONGAP 8 11 7      Hematology Recent Labs  Lab 08/24/17 0234 08/25/17 0938 08/26/17 0217  WBC 7.5 8.3 8.2  RBC 3.31* 3.34* 3.24*  HGB 9.1* 9.2* 8.8*  HCT 28.9* 29.5* 28.4*  MCV 87.3 88.3 87.7  MCH 27.5 27.5 27.2  MCHC 31.5 31.2 31.0  RDW 16.2* 16.1* 16.3*  PLT 266 283 273    Cardiac EnzymesNo results for input(s): TROPONINI in the last 168 hours. No results for input(s): TROPIPOC in the last  168 hours.   BNP Recent Labs  Lab 08/19/17 2300  BNP 204.8*     DDimer No results for input(s): DDIMER in the last 168 hours.   Radiology    No results found.  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  Admitted back to hospital from CIR 08/20/17 with resp distress and edema    Assessment & Plan    1. Acute on chronic diastolic HF - 07/2017 echo LVEF 65-70%, no WMAs, grade II diastolic dysfunction, moderate MR per report - +820 yesterday, neg 13.4 liters since admission. On lasix oral 80mg  in AM 40mg  in PM. Renal function stable -  continue current meds   2. Endocarditis - echo with a highly mobile echodensity of the anterior mitral valve leaflet   measuring 20 x 15 mm (previously 30x20 mm), consistent with a   vegetation. A smaller vegetation is seen on the posterior leaflet   that appears to be perforated. There is a possible small vegation   attached to the left coronary leaflet of the aortic valve. - moderate MR by TTE, severe MR by TEE - on abx. From CT surgery notes plan for surgery Jan 8  3. DVT - on hep gtt   No additonal recs over the weekend, please call with questions.     For questions or updates, please contact [facility] Please consult [email or web address] for contact info under Cardiology/STEMI.      Joanie Coddington, MD  08/26/2017, 11:46 AM

## 2017-08-26 NOTE — Progress Notes (Signed)
ANTICOAGULATION CONSULT NOTE   Pharmacy Consult for Heparin Indication: DVT  Allergies  Allergen Reactions  . Morphine And Related Other (See Comments)    "freaks out"    Patient Measurements: Height: 5\' 9"  (175.3 cm) Weight: 230 lb 11.2 oz (104.6 kg) IBW/kg (Calculated) : 70.7 Heparin Dosing Weight: 96.2 kg  Vital Signs: Temp: 98.9 F (37.2 C) (01/04 2112) Temp Source: Oral (01/04 2112) BP: 104/70 (01/04 2112) Pulse Rate: 84 (01/04 2112)  Labs: Recent Labs    08/23/17 0314  08/24/17 0234  08/25/17 0938 08/25/17 0941 08/25/17 1933 08/26/17 0217  HGB  --    < > 9.1*  --  9.2*  --   --  8.8*  HCT  --   --  28.9*  --  29.5*  --   --  28.4*  PLT  --   --  266  --  283  --   --  273  HEPARINUNFRC  --   --   --    < >  --  0.18* 0.17* 0.28*  CREATININE 0.85  --  0.83  --  0.77  --   --   --    < > = values in this interval not displayed.    Estimated Creatinine Clearance: 121.5 mL/min (by C-G formula based on SCr of 0.77 mg/dL).   Medical History: Past Medical History:  Diagnosis Date  . Acute osteomyelitis of cervical spine (HCC) 08/10/2017  . Alcohol abuse   . CAD (coronary artery disease)   . Diabetes mellitus type 2 in obese (HCC)   . Mitral regurgitation   . Neck pain   . 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 Tobacco abuse     Medications:  Scheduled:  . budesonide  0.25 mg Nebulization BID  . chlorhexidine  15 mL Mouth/Throat BID  . feeding supplement (GLUCERNA SHAKE)  237 mL Oral TID WC  . ferrous fumarate-b12-vitamic C-folic acid  1 capsule Oral TID PC  . furosemide  40 mg Oral QPM  . furosemide  80 mg Oral Daily  . insulin aspart  0-15 Units Subcutaneous TID WC  . insulin glargine  14 Units Subcutaneous Daily  . lidocaine  1 patch Transdermal Q24H  . lisinopril  5 mg Oral Daily  . metoprolol tartrate  25 mg Oral BID  . potassium chloride  20 mEq Oral BID  . spironolactone  12.5 mg Oral Daily    Assessment: 56 yom admitted for evaluation  of CHF and pulmonary edema. Left lower extremity duplex found evidence of acute DVT involving the posterior tibial veins of lower extremity. He was on DVT prophylaxis prior to duplex and transitioned to therapeutic dosing 1 mg/kg every 12 hours- last dose was on 1/3 at 0552.   Given valve surgery next week, will transition to IV heparin therapy.   1/5 AM: heparin level remains low but trending up, no issues per RN.  Goal of Therapy:  Heparin level 0.3-0.7 units/ml Monitor platelets by anticoagulation protocol: Yes   Plan:  -Inc heparin to 2250 units/hr -1100 HL  Abran DukeJames Aaditya Letizia, PharmD, BCPS Clinical Pharmacist Phone: [phone]

## 2017-08-26 NOTE — Progress Notes (Signed)
PROGRESS NOTE    Reginald [name]  WUJ:[id number] DOB: 11-Sep-1960 DOA: 08/20/2017 PCP: Reginald [name]   Brief Narrative:  Reginald [name] is a 57 y.o. male with medical history significant of osteomyelitis of the spine, alcohol abuse, type 2 diabetes, paraspinal abscess, tobacco abuse, nonobstructive CAD, mitral regurgitation who was recently admitted from 07/25/2017 to 08/10/2017 due to mitral valve large vegetation/endocarditis, possible aortic valve endocarditis, septic emboli to the brain, multifocal pneumonia, C1 osteomyelitis, C4 discitis and paraspinal abscess due to MRSA bacteremia.  He was discharged to the rehab unit where he has been continuing the Daptomycin IVPB regimen for a total of 6 weeks. He was admitted for Acute Respiratory Failure from CHF Exacerbation and Heart Failure Team has been following. Cardiothoracic Surgery Dr. Donata Clay following and planning to take patient for MVR with Bioprosthetic Valve 08/29/17. Patient found to have an acute DVT and now on Heparin gtt and will continue for Surgery on 08/29/17.   Assessment & Plan:   Principal Problem:   Acute respiratory failure (HCC) Active Problems:   MRSA bacteremia   Mitral valve vegetation   Acute diastolic CHF (congestive heart failure) (HCC)   Endocarditis of mitral valve   Cerebral septic emboli (HCC)   Hyponatremia   Essential hypertension   Type 2 diabetes mellitus with peripheral neuropathy (HCC)  Acute respiratory failure (HCC) likely from CHF Exacerbation -Likely secondary to CHF exacerbation in the setting of severe mitral valve regurgiation -Now on 1 Liter of O2 via [place] -He was sent from inpatient rehab center to hospital and admitted to stepdown, he is started on iv lasix/metolazone and put on bipap,  -Much improved; [name] Cards, d/c metolazone d/ed, change bipap to prn -Heart Failure Team consulted, meds [name] heart failure team -[name] C/w Lasix 80 mg po Daily and they are adding po Lasix 40  mg qEvening;  -C/w Spironolactone 12.5 mg po Daily   Bilateral lower extremity edema from CHF with Left worse than Right 2/2 Acute DVT; -Left > right -Venous doppler showed acute DVT involving the posterior tibial veins of the LLE -Given full dose Lovenox and changed to IV Heparin gtt -Dg x ray left foot "Fracture proximal aspect of fifth proximal -phalanx, stable from 1 week prior. Minimal healing response in this area. No other fracture. No dislocation" -Chest CTA showed No evidence of significant pulmonary embolus. Dilated ascending thoracic aorta at 4.4 cm.  Diffuse patchy airspace disease throughout both lungs, progressing since previous study. This could represent edema or multifocal pneumonia. Small bilateral pleural effusions with basilar atelectasis. Secretions versus mucous plugging in the lower lobe bronchioles. Aortic atherosclerosis.  Coronary artery calcification. -C/w Heparin gtt in anticipation of Surgery   MRSA bacteremia  MV endocarditis, Large vegetation Possible AV endocarditis CNS, septic emboli to brain Possible multifocal pneumonia on CT C1 vertebral osteo, C4 discitis, paraspinal abscess -He was hospitalized from  12/4 to 12/20 for this , he was discharged to rehab on iv abx ( Daptomycin) in anticipating cardiac thoracic surgery once he regain some strength. -Blood culture has been negative since 12/13 -[name] last infectious disease note on 12/20 by Dr Zenaida Niece dam:  C/w Monotherapy Daptomycin through Surgery and 6 weeks after and send valve tissue for culture from [name] Cervical spine osteo and paraspinal abscess -Repeat MRI Done w/o Contrast showed continued evidence of osteomyelitis and joint infection in the C1-2 region, with evidence of positive response to treatment since the study of 08/08/2017.There is less regional edema and the small paraspinous  fluid collections are diminishing. No progressive or worsening findings. There is also less edema at the C4-5 level.No  worsening or new finding. -Dr. Daiva EvesVan Dam recommended getting a repeat MRI prior to Stopping IV Abx -Monitor CK weekly. Last one was 36 -Continue Daptomycin for now -Thoracic surgery consulted, will follow recommendation and plan is for MVR with Bioprosthetic Valve on 08/29/16  Vertebral Osteomyelitis -This was noted on MRI C-spine ordered by ID. There was osteomyelitis of the odontoid process. My Colleague discussed Case with Neurosurgery who reviewed films during last hospitalization, recommended repeat MRI C-spine in 4-6 weeks (Jan 10-20th roughly). -However, in light of neck hyperextension for anesthesia, would recommend repeat MRI imaging before surgery -MRI  cervical spine ordered on 1/2 and done 1/3 showed The patient has osteomyelitis at the C1-2 level with septic arthritis of the skullbase C1 articulation and C1-C2 articulation. Today's study was ordered and performed without contrast, hindering evaluation to some degree. I think there is slightly less edema in the region and I think that paraspinous fluid collections are smaller. I do not see any progressive disease or new finding. No cord compression. Mild edema at the C4-5 level also appears slightly improved without progression or new finding. Chronic discogenic endplate changes at C5-6 appear the same. -Was Consider Repeating MRI with Contrast prior to Surgery on 08/29/16 but after discussion with ID Dr. Luciana Axeomer will hold off for now.   HTN:  -Controlled and on the lower side -C/w Metoprolol 25 mg po BID, Lisinopril 5 mg po Daily, and Furosemide 80 mg po Daily and 40 mg po qEvening  Diabetes: -He was started on insulin during last hospitalization, prior to that he was not on insulin -Hba1c 5.8 ( may not be reliable due to anemia, hgb 7.6) -C/w Lantus 14 units sq Daily and with Moderate Novolog SSI AC -CBG's ranging from 114-182  Normocytic Anemia -No sign of bleeding -Transfused prbc x1 on 12/31 -Hb/Hct was 9.2/29.5  And went to  8.8/28.4 -Continue to Monitor for S/Sx of Bleeding as patient is on Heparin gtt -Repeat CBC in AM  H/o Alcohol Use -Has alcohol delirum needing precedex drip and intubation during last hospitalization. -Not an acute issue since he has been at rehab since last  Discharge on 12/20.   Hyponatremia  -Patient's Na+ was 129 and has been for the last 4 days -Likley from Diuresis/Volume Overload -Continue to Monitor and Repeat in AM   Hypomagnesemia  -Patient's Mag Level was 1.6 -Replete with IV Mag Sulfate 2 grams -Continue to Monitor and Replete as Necessary -Repeat Mag Level in AM  DVT prophylaxis: Anticoagulated with Heparin gtt Code Status: FULL CODE Family Communication: No Family present at bedside Disposition Plan: Remain Inpatient for anticipated Surgery 08/29/17  Consultants:   Dentistry Dr. Cindra Evesonald Kulinski  Cardiology Dr. Freida Busmanalton McLean/Dr. Branch  Cardiothoracic Surgery Dr. Donata ClayVan Trigt    Procedures:  None   Antimicrobials:  Anti-infectives (From admission, onward)   Start     Dose/Rate Route Frequency Ordered Stop   08/20/17 1500  DAPTOmycin (CUBICIN) 1,000 mg in sodium chloride 0.9 % IVPB     1,000 mg 240 mL/hr over 30 Minutes Intravenous Every 24 hours 08/20/17 0117 09/11/17 1829   08/20/17 1000  ceftaroline (TEFLARO) 600 mg in sodium chloride 0.9 % 250 mL IVPB  Status:  Discontinued     600 mg 250 mL/hr over 60 Minutes Intravenous Every 12 hours 08/20/17 0451 08/23/17 1253     Subjective: Seen and examined and had no  complaints and was resting in bed feeling sleepy as he did not get sleep last night from IV beeping. No CP or SOB. No other concerns or complaints at this time.   Objective: Vitals:   08/25/17 1215 08/25/17 2016 08/25/17 2112 08/26/17 0431  BP:   104/70 (!) 94/56  Pulse: 82  84 84  Resp: (!) 25  (!) 30 18  Temp: (!) 97.5 F (36.4 C)  98.9 F (37.2 C) 98.3 F (36.8 C)  TempSrc: Axillary  Oral Oral  SpO2: 95% 100% 94% 97%  Weight:     104.6 kg (230 lb 11.2 oz)  Height:        Intake/Output Summary (Last 24 hours) at 08/26/2017 0809 Last data filed at 08/25/2017 1200 Gross [name] 24 hour  Intake 780 ml  Output -  Net 780 ml   Filed Weights   08/24/17 0218 08/25/17 0429 08/26/17 0431  Weight: 104.1 kg (229 lb 6.4 oz) 104.6 kg (230 lb 11.2 oz) 104.6 kg (230 lb 11.2 oz)   Examination: Physical Exam:  Constitutional: WN/WD Caucasian Male in NAD; Appears calm and comfortable   Eyes: Sclerae anicteric. Lids normal ENMT: External Ears and nose appear normal. MMM; No dentition  Neck: Supple with no JVD Respiratory: Slightly diminished to auscultation. No appreciable wheezing/rales/rhonchi Cardiovascular: RRR; Has a slight Systolic murmur heard. No LE Edema Abdomen: Soft, NT, ND. Bowel Sounds present  GU: Deferred Musculoskeletal: No contractures; No cyanosis Skin: Warm and Dry; No appreciable rashes or lesions on a limited skin eval  Neurologic: CN 2-12 grossly intact. No appreciable focal deficits Psychiatric: Normal mood and affect. Intact judgement and insight  Data Reviewed: I have personally reviewed following labs and imaging studies  CBC: Recent Labs  Lab 08/20/17 0326 08/21/17 0510 08/24/17 0234 08/25/17 0938 08/26/17 0217  WBC 10.5 7.2 7.5 8.3 8.2  NEUTROABS 8.2*  --   --  5.5 4.9  HGB 7.8* 7.6* 9.1* 9.2* 8.8*  HCT 25.0* 24.1* 28.9* 29.5* 28.4*  MCV 90.6 88.9 87.3 88.3 87.7  PLT 262 252 266 283 273   Basic Metabolic Panel: Recent Labs  Lab 08/21/17 0510 08/22/17 0211 08/23/17 0314 08/24/17 0234 08/25/17 0938 08/26/17 0217  NA 131* 130* 129* 129* 129* 129*  K 2.8* 3.7 3.8 4.0 4.3 4.4  CL 90* 86* 88* 90* 90* 92*  CO2 32 34* 32 31 28 30   GLUCOSE 169* 207* 153* 138* 185* 156*  BUN 18 18 18 18 15 15   CREATININE 0.88 0.91 0.85 0.83 0.77 0.79  CALCIUM 8.5* 8.6* 8.4* 8.3* 8.5* 8.5*  MG 1.6* 2.1  --   --  1.7 1.6*  PHOS  --   --   --   --  3.0 3.8   GFR: Estimated Creatinine Clearance: 121.5  mL/min (by C-G formula based on SCr of 0.79 mg/dL). Liver Function Tests: Recent Labs  Lab 08/25/17 0938 08/26/17 0217  AST 29 25  ALT 24 26  ALKPHOS 191* 205*  BILITOT 1.0 0.9  PROT 7.8 7.9  ALBUMIN 2.3* 2.3*   No results for input(s): LIPASE, AMYLASE in the last 168 hours. No results for input(s): AMMONIA in the last 168 hours. Coagulation Profile: No results for input(s): INR, PROTIME in the last 168 hours. Cardiac Enzymes: Recent Labs  Lab 08/21/17 0510  CKTOTAL 36*   BNP (last 3 results) No results for input(s): PROBNP in the last 8760 hours. HbA1C: No results for input(s): HGBA1C in the last 72 hours. CBG: Recent Labs  Lab 08/25/17 0623 08/25/17 1135 08/25/17 1656 08/25/17 2111 08/26/17 0650  GLUCAP 150* 179* 187* 180* 140*   Lipid Profile: No results for input(s): CHOL, HDL, LDLCALC, TRIG, CHOLHDL, LDLDIRECT in the last 72 hours. Thyroid Function Tests: No results for input(s): TSH, T4TOTAL, FREET4, T3FREE, THYROIDAB in the last 72 hours. Anemia Panel: No results for input(s): VITAMINB12, FOLATE, FERRITIN, TIBC, IRON, RETICCTPCT in the last 72 hours. Sepsis Labs: No results for input(s): PROCALCITON, LATICACIDVEN in the last 168 hours.  Recent Results (from the past 240 hour(s))  Culture, blood (Routine X 2) w Reflex to ID Panel     Status: None   Collection Time: 08/16/17  2:55 PM  Result Value Ref Range Status   Specimen Description BLOOD LEFT ANTECUBITAL  Final   Special Requests   Final    BOTTLES DRAWN AEROBIC ONLY Blood Culture adequate volume   Culture NO GROWTH 5 DAYS  Final   Report Status 08/21/2017 FINAL  Final    Radiology Studies: Mr Cervical Spine Wo Contrast  Result Date: 08/24/2017 CLINICAL DATA:  Followup spinal infection EXAM: MRI CERVICAL SPINE WITHOUT CONTRAST TECHNIQUE: Multiplanar, multisequence MR imaging of the cervical spine was performed. No intravenous contrast was administered. COMPARISON:  MRI 08/08/2017.  MRI 06/2028 2018.   CT 07/20/2017. FINDINGS: Alignment: No change in alignment. Vertebrae: The patient has osteomyelitis at the C1-2 level with septic arthritis of the skullbase C1 articulation and C1-C2 articulation. Today's study was ordered and performed without contrast, hindering evaluation to some degree. I think there is slightly less edema in the region and I think that paraspinous fluid collections are smaller. I do not see any progressive disease or new finding. No cord compression. Mild edema at the C4-5 level also appears slightly improved without progression or new finding. Chronic discogenic endplate changes at C5-6 appear the same. Cord: No cord compression or primary cord lesion. Posterior Fossa, vertebral arteries, paraspinal tissues: As discussed above. Disc levels: As discussed above. IMPRESSION: Today's study was done without contrast. Continued evidence of osteomyelitis and joint infection in the C1-2 region, with evidence of positive response to treatment since the study of 08/08/2017. There is less regional edema and the small paraspinous fluid collections are diminishing. No progressive or worsening findings. There is also less edema at the C4-5 level. No worsening or new finding. Electronically Signed   By: Paulina Fusi M.D.   On: 08/24/2017 10:06   Scheduled Meds: . budesonide  0.25 mg Nebulization BID  . chlorhexidine  15 mL Mouth/Throat BID  . feeding supplement (GLUCERNA SHAKE)  237 mL Oral TID WC  . ferrous fumarate-b12-vitamic C-folic acid  1 capsule Oral TID PC  . furosemide  40 mg Oral QPM  . furosemide  80 mg Oral Daily  . insulin aspart  0-15 Units Subcutaneous TID WC  . insulin glargine  14 Units Subcutaneous Daily  . lidocaine  1 patch Transdermal Q24H  . lisinopril  5 mg Oral Daily  . metoprolol tartrate  25 mg Oral BID  . potassium chloride  20 mEq Oral BID  . spironolactone  12.5 mg Oral Daily   Continuous Infusions: . sodium chloride    . DAPTOmycin (CUBICIN)  IV Stopped  (08/25/17 1800)  . heparin 2,250 Units/hr (08/26/17 0330)    LOS: 6 days   Merlene Laughter, DO [facility] Pager [phone]  If 7PM-7AM, please contact night-coverage [email or web address] Password [email or web address] 08/26/2017, 8:09 AM

## 2017-08-26 NOTE — Progress Notes (Signed)
Pt states he is unable to tolerate CPAP. CPAP machine removed from room at this time.

## 2017-08-26 NOTE — Progress Notes (Signed)
Blood pressure initially noted at 82/42. Patient reported feeling 'tired" and a little lightheaded. Patient's RN in the room. Recheck BP 92/53. Mr Reginald [name] does not want to walk presently. Will follow up with the patient on Monday.Gladstone LighterMaria Whitaker, RN,BSN 08/26/2017 1:08 PM

## 2017-08-26 NOTE — Progress Notes (Signed)
ANTICOAGULATION CONSULT NOTE   Pharmacy Consult for Heparin Indication: DVT  Allergies  Allergen Reactions  . Morphine And Related Other (See Comments)    "freaks out"    Patient Measurements: Height: 5\' 9"  (175.3 cm) Weight: 230 lb 11.2 oz (104.6 kg) IBW/kg (Calculated) : 70.7 Heparin Dosing Weight: 96.2 kg  Vital Signs: Temp: 99.6 F (37.6 C) (01/05 2020) Temp Source: Oral (01/05 2020) BP: 101/68 (01/05 1205) Pulse Rate: 81 (01/05 1205)  Labs: Recent Labs    08/24/17 0234  08/25/17 0938  08/26/17 0217 08/26/17 1031 08/26/17 1850  HGB 9.1*  --  9.2*  --  8.8*  --   --   HCT 28.9*  --  29.5*  --  28.4*  --   --   PLT 266  --  283  --  273  --   --   HEPARINUNFRC  --    < >  --    < > 0.28* 0.23* 0.21*  CREATININE 0.83  --  0.77  --  0.79  --   --    < > = values in this interval not displayed.    Estimated Creatinine Clearance: 121.5 mL/min (by C-G formula based on SCr of 0.79 mg/dL).   Medical History: Past Medical History:  Diagnosis Date  . Acute osteomyelitis of cervical spine (HCC) 08/10/2017  . Alcohol abuse   . CAD (coronary artery disease)   . Diabetes mellitus type 2 in obese (HCC)   . Mitral regurgitation   . Neck pain   . 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 Tobacco abuse     Medications:  Scheduled:  . budesonide  0.25 mg Nebulization BID  . chlorhexidine  15 mL Mouth/Throat BID  . feeding supplement (GLUCERNA SHAKE)  237 mL Oral TID WC  . ferrous fumarate-b12-vitamic C-folic acid  1 capsule Oral TID PC  . furosemide  40 mg Oral QPM  . furosemide  80 mg Oral Daily  . insulin aspart  0-15 Units Subcutaneous TID WC  . insulin glargine  14 Units Subcutaneous Daily  . lidocaine  1 patch Transdermal Q24H  . lisinopril  5 mg Oral Daily  . metoprolol tartrate  25 mg Oral BID  . potassium chloride  20 mEq Oral BID  . spironolactone  12.5 mg Oral Daily    Assessment: 56 yom admitted for evaluation of CHF and pulmonary edema. Left lower  extremity duplex found evidence of acute DVT involving the posterior tibial veins of lower extremity. He was on DVT prophylaxis prior to duplex and transitioned to therapeutic dosing 1 mg/kg every 12 hours- last dose was on 1/3 at 0552.   Given valve surgery next week, will transition to IV heparin therapy.   Heparin level is subtherapeutic at 0.21. No reported issues with infusion.   Goal of Therapy:  Heparin level 0.3-0.7 units/ml Monitor platelets by anticoagulation protocol: Yes   Plan:  -Increase heparin gtt at 2450 units/hr -heparin level with am labs  -Monitor for s/sx of bleeding  Pollyann SamplesAndy Vincient Vanaman, PharmD, BCPS 08/26/2017, 9:24 PM

## 2017-08-27 ENCOUNTER — Inpatient Hospital Stay (HOSPITAL_COMMUNITY): Payer: Medicaid Other

## 2017-08-27 LAB — CBC WITH DIFFERENTIAL/PLATELET
BASOS PCT: 0 %
Basophils Absolute: 0 10*3/uL (ref 0.0–0.1)
EOS ABS: 0.5 10*3/uL (ref 0.0–0.7)
Eosinophils Relative: 5 %
HCT: 28.1 % — ABNORMAL LOW (ref 39.0–52.0)
HEMOGLOBIN: 8.7 g/dL — AB (ref 13.0–17.0)
LYMPHS ABS: 2.8 10*3/uL (ref 0.7–4.0)
Lymphocytes Relative: 30 %
MCH: 27.4 pg (ref 26.0–34.0)
MCHC: 31 g/dL (ref 30.0–36.0)
MCV: 88.6 fL (ref 78.0–100.0)
Monocytes Absolute: 0.4 10*3/uL (ref 0.1–1.0)
Monocytes Relative: 5 %
NEUTROS PCT: 60 %
Neutro Abs: 5.6 10*3/uL (ref 1.7–7.7)
Platelets: 299 10*3/uL (ref 150–400)
RBC: 3.17 MIL/uL — AB (ref 4.22–5.81)
RDW: 16.5 % — ABNORMAL HIGH (ref 11.5–15.5)
WBC: 9.3 10*3/uL (ref 4.0–10.5)

## 2017-08-27 LAB — COMPREHENSIVE METABOLIC PANEL
ALK PHOS: 196 U/L — AB (ref 38–126)
ALT: 24 U/L (ref 17–63)
AST: 28 U/L (ref 15–41)
Albumin: 2.5 g/dL — ABNORMAL LOW (ref 3.5–5.0)
Anion gap: 8 (ref 5–15)
BUN: 14 mg/dL (ref 6–20)
CALCIUM: 8.7 mg/dL — AB (ref 8.9–10.3)
CO2: 26 mmol/L (ref 22–32)
CREATININE: 0.72 mg/dL (ref 0.61–1.24)
Chloride: 93 mmol/L — ABNORMAL LOW (ref 101–111)
GFR calc Af Amer: >60 mL/min (ref >60)
GFR calc non Af Amer: >60 mL/min (ref >60)
GLUCOSE: 149 mg/dL — AB (ref 65–99)
Potassium: 4.5 mmol/L (ref 3.5–5.1)
SODIUM: 127 mmol/L — AB (ref 135–145)
Total Bilirubin: 0.8 mg/dL (ref 0.3–1.2)
Total Protein: 8.5 g/dL — ABNORMAL HIGH (ref 6.5–8.1)

## 2017-08-27 LAB — HEPARIN LEVEL (UNFRACTIONATED)
HEPARIN UNFRACTIONATED: 0.24 [IU]/mL — AB (ref 0.30–0.70)
Heparin Unfractionated: 0.27 IU/mL — ABNORMAL LOW (ref 0.30–0.70)
Heparin Unfractionated: 0.51 IU/mL (ref 0.30–0.70)

## 2017-08-27 LAB — GLUCOSE, CAPILLARY
GLUCOSE-CAPILLARY: 167 mg/dL — AB (ref 65–99)
Glucose-Capillary: 140 mg/dL — ABNORMAL HIGH (ref 65–99)
Glucose-Capillary: 151 mg/dL — ABNORMAL HIGH (ref 65–99)
Glucose-Capillary: 157 mg/dL — ABNORMAL HIGH (ref 65–99)
Glucose-Capillary: 204 mg/dL — ABNORMAL HIGH (ref 65–99)

## 2017-08-27 LAB — PHOSPHORUS: Phosphorus: 4.1 mg/dL (ref 2.5–4.6)

## 2017-08-27 LAB — MAGNESIUM: Magnesium: 1.8 mg/dL (ref 1.7–2.4)

## 2017-08-27 MED ORDER — TRAMADOL HCL 50 MG PO TABS
50.0000 mg | ORAL_TABLET | Freq: Four times a day (QID) | ORAL | Status: DC | PRN
  Administered 2017-08-27 – 2017-08-28 (×3): 50 mg via ORAL
  Filled 2017-08-27 (×3): qty 1

## 2017-08-27 NOTE — Progress Notes (Signed)
ANTICOAGULATION CONSULT NOTE   Pharmacy Consult for Heparin Indication: DVT  Allergies  Allergen Reactions  . Morphine And Related Other (See Comments)    "freaks out"   Patient Measurements: Height: 5\' 9"  (175.3 cm) Weight: 230 lb 11.2 oz (104.6 kg) IBW/kg (Calculated) : 70.7 Heparin Dosing Weight: 96.2 kg  Vital Signs: Temp: 98.3 F (36.8 C) (01/06 0018) Temp Source: Oral (01/06 0018) BP: 115/62 (01/06 0018) Pulse Rate: 85 (01/06 0018)  Labs: Recent Labs    08/25/17 0938  08/26/17 0217 08/26/17 1031 08/26/17 1850 08/27/17 0224  HGB 9.2*  --  8.8*  --   --  8.7*  HCT 29.5*  --  28.4*  --   --  28.1*  PLT 283  --  273  --   --  299  HEPARINUNFRC  --    < > 0.28* 0.23* 0.21* 0.27*  CREATININE 0.77  --  0.79  --   --   --    < > = values in this interval not displayed.    Estimated Creatinine Clearance: 121.5 mL/min (by C-G formula based on SCr of 0.79 mg/dL).   Medical History: Past Medical History:  Diagnosis Date  . Acute osteomyelitis of cervical spine (HCC) 08/10/2017  . Alcohol abuse   . CAD (coronary artery disease)   . Diabetes mellitus type 2 in obese (HCC)   . Mitral regurgitation   . Neck pain   . 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 Tobacco abuse     Medications:  Scheduled:  . budesonide  0.25 mg Nebulization BID  . chlorhexidine  15 mL Mouth/Throat BID  . feeding supplement (GLUCERNA SHAKE)  237 mL Oral TID WC  . ferrous fumarate-b12-vitamic C-folic acid  1 capsule Oral TID PC  . furosemide  40 mg Oral QPM  . furosemide  80 mg Oral Daily  . insulin aspart  0-15 Units Subcutaneous TID WC  . insulin glargine  14 Units Subcutaneous Daily  . lidocaine  1 patch Transdermal Q24H  . lisinopril  5 mg Oral Daily  . metoprolol tartrate  25 mg Oral BID  . potassium chloride  20 mEq Oral BID  . spironolactone  12.5 mg Oral Daily    Assessment: 56 yom admitted for evaluation of CHF and pulmonary edema. Left lower extremity duplex found  evidence of acute DVT involving the posterior tibial veins of lower extremity. He was on DVT prophylaxis prior to duplex and transitioned to therapeutic dosing 1 mg/kg every 12 hours- last dose was on 1/3 at 0552.   Given valve surgery next week, will transition to IV heparin therapy.   1/5 AM: heparin level remains low this AM  Goal of Therapy:  Heparin level 0.3-0.7 units/ml Monitor platelets by anticoagulation protocol: Yes   Plan:  -Inc heparin to 2650 units/hr -1200 HL  Abran DukeJames Klark Vanderhoef, PharmD, BCPS Clinical Pharmacist Phone: [phone]

## 2017-08-27 NOTE — Progress Notes (Signed)
PROGRESS NOTE    Reginald [name]  BJY:[id number] DOB: 11/24/1960 DOA: 08/20/2017 PCP: Patient, No Pcp Per   Brief Narrative:  Reginald [name] is a 57 y.o. male with medical history significant of osteomyelitis of the spine, alcohol abuse, type 2 diabetes, paraspinal abscess, tobacco abuse, nonobstructive CAD, mitral regurgitation who was recently admitted from 07/25/2017 to 08/10/2017 due to mitral valve large vegetation/endocarditis, possible aortic valve endocarditis, septic emboli to the brain, multifocal pneumonia, C1 osteomyelitis, C4 discitis and paraspinal abscess due to MRSA bacteremia.  He was discharged to the rehab unit where he has been continuing the Daptomycin IVPB regimen for a total of 6 weeks. He was admitted for Acute Respiratory Failure from CHF Exacerbation and Heart Failure Team has been following. Cardiothoracic Surgery Dr. Donata Clay following and planning to take patient for MVR with Bioprosthetic Valve 08/29/17. Patient found to have an acute DVT and now on Heparin gtt and will continue for Surgery on 08/29/17.   Assessment & Plan:   Principal Problem:   Acute respiratory failure (HCC) Active Problems:   MRSA bacteremia   Mitral valve vegetation   Acute diastolic CHF (congestive heart failure) (HCC)   Endocarditis of mitral valve   Cerebral septic emboli (HCC)   Hyponatremia   Essential hypertension   Type 2 diabetes mellitus with peripheral neuropathy (HCC)  Acute respiratory failure (HCC) likely from CHF Exacerbation -Likely secondary to CHF exacerbation in the setting of severe mitral valve regurgiation -Now on 1 Liter of O2 via [place] -He was sent from inpatient rehab center to hospital and admitted to stepdown, he is started on iv lasix/metolazone and put on bipap,  -Much improved; per Cards, d/c metolazone d/ed, change bipap to prn -Heart Failure Team consulted, meds per heart failure team -Patient is -12.032 Liters since admission and is down 24 lbs. -Per  Cardiology C/w Lasix 80 mg po Daily they po Lasix 40 mg qEvening; After discussion with Dr. Wyline Mood will D/C Evening Lasix dose because of Hyponatremia -C/w Spironolactone 12.5 mg po Daily   Bilateral lower extremity edema from CHF with Left worse than Right 2/2 Acute DVT; -Left > right -Venous doppler showed acute DVT involving the posterior tibial veins of the LLE -Given full dose Lovenox and changed to IV Heparin gtt -Dg x ray left foot "Fracture proximal aspect of fifth proximal -phalanx, stable from 1 week prior. Minimal healing response in this area. No other fracture. No dislocation" -Chest CTA showed No evidence of significant pulmonary embolus. Dilated ascending thoracic aorta at 4.4 cm.  Diffuse patchy airspace disease throughout both lungs, progressing since previous study. This could represent edema or multifocal pneumonia. Small bilateral pleural effusions with basilar atelectasis. Secretions versus mucous plugging in the lower lobe bronchioles. Aortic atherosclerosis.  Coronary artery calcification. -C/w Heparin gtt in anticipation of Surgery   MRSA bacteremia  MV endocarditis, Large vegetation Possible AV endocarditis CNS, septic emboli to brain Possible multifocal pneumonia on CT C1 vertebral osteo, C4 discitis, paraspinal abscess -He was hospitalized from  12/4 to 12/20 for this , he was discharged to rehab on iv abx ( Daptomycin) in anticipating cardiac thoracic surgery once he regain some strength. -Blood culture has been negative since 12/13 -Per last infectious disease note on 12/20 by Dr Zenaida Niece dam:  C/w Monotherapy Daptomycin through Surgery and 6 weeks after and send valve tissue for culture from [facility] Cervical spine osteo and paraspinal abscess -Repeat MRI Done w/o Contrast showed continued evidence of osteomyelitis and joint infection in the C1-2 region,  with evidence of positive response to treatment since the study of 08/08/2017.There is less regional edema and the  small paraspinous fluid collections are diminishing. No progressive or worsening findings. There is also less edema at the C4-5 level.No worsening or new finding. -Dr. Daiva Eves recommended getting a repeat MRI prior to Stopping IV Abx -Monitor CK weekly. Last one was 36 -Continue Daptomycin for now -Thoracic surgery consulted, will follow recommendation and plan is for MVR with Bioprosthetic Valve on 08/29/16 -Added Tramadol for Pain  Vertebral Osteomyelitis -This was noted on MRI C-spine ordered by ID. There was osteomyelitis of the odontoid process. My Colleague discussed Case with Neurosurgery who reviewed films during last hospitalization, recommended repeat MRI C-spine in 4-6 weeks (Jan 10-20th roughly). -However, in light of neck hyperextension for anesthesia, would recommend repeat MRI imaging before surgery -MRI  cervical spine ordered on 1/2 and done 1/3 showed The patient has osteomyelitis at the C1-2 level with septic arthritis of the skullbase C1 articulation and C1-C2 articulation. Today's study was ordered and performed without contrast, hindering evaluation to some degree. I think there is slightly less edema in the region and I think that paraspinous fluid collections are smaller. I do not see any progressive disease or new finding. No cord compression. Mild edema at the C4-5 level also appears slightly improved without progression or new finding. Chronic discogenic endplate changes at C5-6 appear the same. -Was Consider Repeating MRI with Contrast prior to Surgery on 08/29/16 but after discussion with ID Dr. Luciana Axe will hold off for now.  -Added Tramadol for Pain  HTN:  -Controlled and on the lower side -C/w Metoprolol 25 mg po BID, Lisinopril 5 mg po Daily, and Furosemide 80 mg po Daily; Lasix 40 mg po qEvening now D/C'd by Cardiology   Diabetes: -He was started on insulin during last hospitalization, prior to that he was not on insulin -Hba1c 5.8 ( may not be reliable due to  anemia, hgb 7.6) -C/w Lantus 14 units sq Daily and with Moderate Novolog SSI AC -CBG's ranging from 140-167  Normocytic Anemia -No sign of bleeding -Transfused prbc x1 on 12/31 -Hb/Hct was 9.2/29.5 -> 8.8/28.4 -> 8.7/28.1 -Continue to Monitor for S/Sx of Bleeding as patient is on Heparin gtt -Repeat CBC in AM  H/o Alcohol Use -Has alcohol delirum needing precedex drip and intubation during last hospitalization. -Not an acute issue since he has been at rehab since last  Discharge on 12/20.   Hyponatremia  -Patient's Na+ was 129 and has been for the last 4 days and now was 127 -Likley from Diuresis/Volume Overload; After discussion with Cardiology Dr. Wyline Mood will discontinue evening dose of Lasix -Continue to Monitor and Repeat in AM   Hypomagnesemia  -Patient's Mag Level was 1.8 -Replete with IV Mag Sulfate 2 grams yesterday  -Continue to Monitor and Replete as Necessary -Repeat Mag Level in AM  DVT prophylaxis: Anticoagulated with Heparin gtt Code Status: FULL CODE Family Communication: No Family present at bedside Disposition Plan: Remain Inpatient for anticipated Surgery 08/29/17  Consultants:   Dentistry Dr. Cindra Eves  Cardiology Dr. Freida Busman McLean/Dr. Branch  Cardiothoracic Surgery Dr. Donata Clay    Procedures:  None   Antimicrobials:  Anti-infectives (From admission, onward)   Start     Dose/Rate Route Frequency Ordered Stop   08/20/17 1500  DAPTOmycin (CUBICIN) 1,000 mg in sodium chloride 0.9 % IVPB     1,000 mg 240 mL/hr over 30 Minutes Intravenous Every 24 hours 08/20/17 0117 09/11/17 1829  08/20/17 1000  ceftaroline (TEFLARO) 600 mg in sodium chloride 0.9 % 250 mL IVPB  Status:  Discontinued     600 mg 250 mL/hr over 60 Minutes Intravenous Every 12 hours 08/20/17 0451 08/23/17 1253     Subjective: Seen and examined and had a headache this AM and neck pain. No CP or SOB. Felt frustrated just laying in bed awaiting the surgery.     Objective: Vitals:   08/27/17 0832 08/27/17 0900 08/27/17 1200 08/27/17 1600  BP:  113/67 115/71 110/63  Pulse: 85 85 85 91  Resp: 16 19 18  (!) 30  Temp:  98.4 F (36.9 C) 99 F (37.2 C)   TempSrc:  Oral Oral   SpO2: 100% 95% 92% (!) 85%  Weight:      Height:        Intake/Output Summary (Last 24 hours) at 08/27/2017 1629 Last data filed at 08/27/2017 1230 Gross per 24 hour  Intake 2457.58 ml  Output 1050 ml  Net 1407.58 ml   Filed Weights   08/25/17 0429 08/26/17 0431 08/27/17 0435  Weight: 104.6 kg (230 lb 11.2 oz) 104.6 kg (230 lb 11.2 oz) 103.4 kg (227 lb 15.3 oz)   Examination: Physical Exam:  Constitutional: WN/WD Caucasian Male in NAD; Appears calm and comfortable  Eyes: Sclerae anicteric. Lids normal ENMT: External Ears and nose appear normal. MMM Neck: Supple with no JVD Respiratory: CTAB; No appreciable wheezing/rales/rhonchi; Unlabored breathing Cardiovascular: RRR; Has a systolic murmur that is slight. No LE edema Abdomen: Soft, NT, ND. Bowel Sounds present GU: Deferred Musculoskeletal: No contractures; No cyanosis Skin: Warm and Dry; No appreciable rashes or lesions on a limited skin eval Neurologic: CN 2-12 grossly intact. No appreciable focal deficits Psychiatric: Depressed appearing mood and flat affect. Intact judgement and insight  Data Reviewed: I have personally reviewed following labs and imaging studies  CBC: Recent Labs  Lab 08/21/17 0510 08/24/17 0234 08/25/17 0938 08/26/17 0217 08/27/17 0224  WBC 7.2 7.5 8.3 8.2 9.3  NEUTROABS  --   --  5.5 4.9 5.6  HGB 7.6* 9.1* 9.2* 8.8* 8.7*  HCT 24.1* 28.9* 29.5* 28.4* 28.1*  MCV 88.9 87.3 88.3 87.7 88.6  PLT 252 266 283 273 299   Basic Metabolic Panel: Recent Labs  Lab 08/21/17 0510 08/22/17 0211 08/23/17 0314 08/24/17 0234 08/25/17 0938 08/26/17 0217 08/27/17 0224  NA 131* 130* 129* 129* 129* 129* 127*  K 2.8* 3.7 3.8 4.0 4.3 4.4 4.5  CL 90* 86* 88* 90* 90* 92* 93*  CO2 32 34* 32  31 28 30 26   GLUCOSE 169* 207* 153* 138* 185* 156* 149*  BUN 18 18 18 18 15 15 14   CREATININE 0.88 0.91 0.85 0.83 0.77 0.79 0.72  CALCIUM 8.5* 8.6* 8.4* 8.3* 8.5* 8.5* 8.7*  MG 1.6* 2.1  --   --  1.7 1.6* 1.8  PHOS  --   --   --   --  3.0 3.8 4.1   GFR: Estimated Creatinine Clearance: 120.8 mL/min (by C-G formula based on SCr of 0.72 mg/dL). Liver Function Tests: Recent Labs  Lab 08/25/17 0938 08/26/17 0217 08/27/17 0224  AST 29 25 28   ALT 24 26 24   ALKPHOS 191* 205* 196*  BILITOT 1.0 0.9 0.8  PROT 7.8 7.9 8.5*  ALBUMIN 2.3* 2.3* 2.5*   No results for input(s): LIPASE, AMYLASE in the last 168 hours. No results for input(s): AMMONIA in the last 168 hours. Coagulation Profile: No results for input(s): INR, PROTIME in the  last 168 hours. Cardiac Enzymes: Recent Labs  Lab 08/21/17 0510  CKTOTAL 36*   BNP (last 3 results) No results for input(s): PROBNP in the last 8760 hours. HbA1C: No results for input(s): HGBA1C in the last 72 hours. CBG: Recent Labs  Lab 08/26/17 2034 08/27/17 0643 08/27/17 0950 08/27/17 1135 08/27/17 1615  GLUCAP 249* 140* 167* 151* 157*   Lipid Profile: No results for input(s): CHOL, HDL, LDLCALC, TRIG, CHOLHDL, LDLDIRECT in the last 72 hours. Thyroid Function Tests: No results for input(s): TSH, T4TOTAL, FREET4, T3FREE, THYROIDAB in the last 72 hours. Anemia Panel: No results for input(s): VITAMINB12, FOLATE, FERRITIN, TIBC, IRON, RETICCTPCT in the last 72 hours. Sepsis Labs: No results for input(s): PROCALCITON, LATICACIDVEN in the last 168 hours.  No results found for this or any previous visit (from the past 240 hour(s)).  Radiology Studies: Dg Chest 2 View  Result Date: 08/27/2017 CLINICAL DATA:  57 year old male with mitral valve endocarditis, chest pain EXAM: CHEST  2 VIEW COMPARISON:  Recent prior chest x-ray 08/22/2017; chest CT 08/23/2017 FINDINGS: Stable cardiac and mediastinal contours. Atherosclerotic calcifications are present  in the transverse aorta. Slight interval increase in diffuse bilateral interstitial and airspace opacities with relatively increased confluence in the right upper lobe. Small left pleural effusion with associated basilar atelectasis. No acute osseous abnormality. IMPRESSION: 1. Similar to slightly increased bilateral interstitial and airspace opacities which remain most consistent with pulmonary edema. 2. Increase confluence of the airspace opacities in the periphery of the right upper lung again noted. This may represent asymmetric pulmonary edema, or a focus of active infection/inflammation such as pneumonia. 3. Persistent small left larger than right bilateral pleural effusions and associated bibasilar atelectasis. 4.  Aortic Atherosclerosis (ICD10-170.0) Electronically Signed   By: Malachy Moan M.D.   On: 08/27/2017 10:20   Scheduled Meds: . budesonide  0.25 mg Nebulization BID  . chlorhexidine  15 mL Mouth/Throat BID  . feeding supplement (GLUCERNA SHAKE)  237 mL Oral TID WC  . ferrous fumarate-b12-vitamic C-folic acid  1 capsule Oral TID PC  . furosemide  80 mg Oral Daily  . insulin aspart  0-15 Units Subcutaneous TID WC  . insulin glargine  14 Units Subcutaneous Daily  . lidocaine  1 patch Transdermal Q24H  . lisinopril  5 mg Oral Daily  . metoprolol tartrate  25 mg Oral BID  . potassium chloride  20 mEq Oral BID  . spironolactone  12.5 mg Oral Daily   Continuous Infusions: . sodium chloride    . DAPTOmycin (CUBICIN)  IV Stopped (08/26/17 2028)  . heparin 2,650 Units/hr (08/27/17 0441)    LOS: 7 days   Merlene Laughter, DO [facility] Pager [phone]  If 7PM-7AM, please contact night-coverage [email or web address] Password [facility] 08/27/2017, 4:29 PM

## 2017-08-27 NOTE — Progress Notes (Signed)
Patient refused CPAP at this time.

## 2017-08-27 NOTE — Progress Notes (Signed)
ANTICOAGULATION CONSULT NOTE   Pharmacy Consult for Heparin Indication: DVT  Allergies  Allergen Reactions  . Morphine And Related Other (See Comments)    "freaks out"   Patient Measurements: Height: 5\' 9"  (175.3 cm) Weight: 227 lb 15.3 oz (103.4 kg) IBW/kg (Calculated) : 70.7 Heparin Dosing Weight: 96.2 kg  Vital Signs: Temp: 99 F (37.2 C) (01/06 1200) Temp Source: Oral (01/06 1200) BP: 115/71 (01/06 1200) Pulse Rate: 85 (01/06 1200)  Labs: Recent Labs    08/25/17 0938  08/26/17 0217  08/26/17 1850 08/27/17 0224 08/27/17 1153  HGB 9.2*  --  8.8*  --   --  8.7*  --   HCT 29.5*  --  28.4*  --   --  28.1*  --   PLT 283  --  273  --   --  299  --   HEPARINUNFRC  --    < > 0.28*   < > 0.21* 0.27* 0.51  CREATININE 0.77  --  0.79  --   --  0.72  --    < > = values in this interval not displayed.    Estimated Creatinine Clearance: 120.8 mL/min (by C-G formula based on SCr of 0.72 mg/dL).   Medical History: Past Medical History:  Diagnosis Date  . Acute osteomyelitis of cervical spine (HCC) 08/10/2017  . Alcohol abuse   . CAD (coronary artery disease)   . Diabetes mellitus type 2 in obese (HCC)   . Mitral regurgitation   . Neck pain   . 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 Tobacco abuse     Medications:  Scheduled:  . budesonide  0.25 mg Nebulization BID  . chlorhexidine  15 mL Mouth/Throat BID  . feeding supplement (GLUCERNA SHAKE)  237 mL Oral TID WC  . ferrous fumarate-b12-vitamic C-folic acid  1 capsule Oral TID PC  . furosemide  80 mg Oral Daily  . insulin aspart  0-15 Units Subcutaneous TID WC  . insulin glargine  14 Units Subcutaneous Daily  . lidocaine  1 patch Transdermal Q24H  . lisinopril  5 mg Oral Daily  . metoprolol tartrate  25 mg Oral BID  . potassium chloride  20 mEq Oral BID  . spironolactone  12.5 mg Oral Daily    Assessment: 56 yom admitted for evaluation of CHF and pulmonary edema. Left lower extremity duplex found evidence of  acute DVT involving the posterior tibial veins of lower extremity. He was on DVT prophylaxis prior to duplex and transitioned to therapeutic dosing 1 mg/kg every 12 hours- last dose was on 1/3 at 0552.   Plan for valve surgery next week, transition to IV heparin therapy.   Heparin level is therapeutic after rate increase this am (0.51).  Goal of Therapy:  Heparin level 0.3-0.7 units/ml Monitor platelets by anticoagulation protocol: Yes   Plan:  Continue heparin at 2650 units/hr Check anti-Xa level in 6 hours and daily while on heparin Continue to monitor H&H and platelets   Thank you for allowing us to participate in this patients care.  Signe Coltonya C Khristian Phillippi, PharmD Clinical phone for 08/27/2017: x [phone] 08/27/2017 1:55 PM

## 2017-08-27 NOTE — Progress Notes (Signed)
ANTICOAGULATION CONSULT NOTE   Pharmacy Consult for Heparin Indication: DVT  Allergies  Allergen Reactions  . Morphine And Related Other (See Comments)    "freaks out"   Patient Measurements: Height: 5\' 9"  (175.3 cm) Weight: 227 lb 15.3 oz (103.4 kg) IBW/kg (Calculated) : 70.7 Heparin Dosing Weight: 96.2 kg  Vital Signs: Temp: 99 F (37.2 C) (01/06 1200) Temp Source: Oral (01/06 1200) BP: 110/63 (01/06 1600) Pulse Rate: 91 (01/06 1600)  Labs: Recent Labs    08/25/17 0938  08/26/17 0217  08/27/17 0224 08/27/17 1153 08/27/17 1857  HGB 9.2*  --  8.8*  --  8.7*  --   --   HCT 29.5*  --  28.4*  --  28.1*  --   --   PLT 283  --  273  --  299  --   --   HEPARINUNFRC  --    < > 0.28*   < > 0.27* 0.51 0.24*  CREATININE 0.77  --  0.79  --  0.72  --   --    < > = values in this interval not displayed.    Estimated Creatinine Clearance: 120.8 mL/min (by C-G formula based on SCr of 0.72 mg/dL).   Medical History: Past Medical History:  Diagnosis Date  . Acute osteomyelitis of cervical spine (HCC) 08/10/2017  . Alcohol abuse   . CAD (coronary artery disease)   . Diabetes mellitus type 2 in obese (HCC)   . Mitral regurgitation   . Neck pain   . 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 Tobacco abuse     Medications:  Scheduled:  . budesonide  0.25 mg Nebulization BID  . chlorhexidine  15 mL Mouth/Throat BID  . feeding supplement (GLUCERNA SHAKE)  237 mL Oral TID WC  . ferrous fumarate-b12-vitamic C-folic acid  1 capsule Oral TID PC  . furosemide  80 mg Oral Daily  . insulin aspart  0-15 Units Subcutaneous TID WC  . insulin glargine  14 Units Subcutaneous Daily  . lidocaine  1 patch Transdermal Q24H  . lisinopril  5 mg Oral Daily  . metoprolol tartrate  25 mg Oral BID  . potassium chloride  20 mEq Oral BID  . spironolactone  12.5 mg Oral Daily    Assessment: 56 yom admitted for evaluation of CHF and pulmonary edema. Left lower extremity duplex found evidence of  acute DVT involving the posterior tibial veins of lower extremity. He was on DVT prophylaxis prior to duplex and transitioned to therapeutic dosing 1 mg/kg every 12 hours- last dose was on 1/3 at 0552.   Plan for valve surgery next week, transition to IV heparin therapy.   Heparin level is subtherapeutic this evening (0.24). RN denies any interruptions or problems with infusion.  Goal of Therapy:  Heparin level 0.3-0.7 units/ml Monitor platelets by anticoagulation protocol: Yes   Plan:  Increase heparin to 2700 units/hr Check anti-Xa level in 6 hours and daily while on heparin Continue to monitor H&H and platelets   Thank you for allowing us to participate in this patients care.  Signe Coltonya C Tyteanna Ost, PharmD Clinical phone for 08/27/2017: x [phone] 08/27/2017 8:36 PM

## 2017-08-27 NOTE — Progress Notes (Signed)
Pts Na early this AM was 127. Relayed to attending MD

## 2017-08-27 NOTE — Progress Notes (Signed)
Sodium trending down since additional oral lasix added, we will d/c night time dose, conitnue AM dose. No further cardiology recs today, please call with questions.    Dina RichJonathan Lexiana Spindel MD

## 2017-08-27 NOTE — Plan of Care (Signed)
  Progressing Activity: Capacity to carry out activities will improve 08/27/2017 2235 - Progressing by Ruel FavorsBrown-Staples, Guadalupe Kerekes, RN Cardiac: Ability to achieve and maintain adequate cardiopulmonary perfusion will improve 08/27/2017 2235 - Progressing by Ruel FavorsBrown-Staples, Bena Kobel, RN Coping: Ability to identify and develop effective coping behavior will improve 08/27/2017 2235 - Progressing by Ruel FavorsBrown-Staples, Tayana Shankle, RN Self-Concept: Ability to identify factors that promote anxiety will improve 08/27/2017 2235 - Progressing by Ruel FavorsBrown-Staples, Nikyah Lackman, RN Level of anxiety will decrease 08/27/2017 2235 - Progressing by Ruel FavorsBrown-Staples, Aulani Shipton, RN Ability to modify response to factors that promote anxiety will improve 08/27/2017 2235 - Progressing by Ruel FavorsBrown-Staples, Annalysia Willenbring, RN Education: Knowledge of General Education information will improve 08/27/2017 2235 - Progressing by Ruel FavorsBrown-Staples, Tavaras Goody, RN Health Behavior/Discharge Planning: Ability to manage health-related needs will improve 08/27/2017 2235 - Progressing by Ruel FavorsBrown-Staples, Magaby Rumberger, RN Clinical Measurements: Ability to maintain clinical measurements within normal limits will improve 08/27/2017 2235 - Progressing by Ruel FavorsBrown-Staples, Mayvis Agudelo, RN Will remain free from infection 08/27/2017 2235 - Progressing by Ruel FavorsBrown-Staples, Manami Tutor, RN Diagnostic test results will improve 08/27/2017 2235 - Progressing by Ruel FavorsBrown-Staples, Reyaan Thoma, RN Respiratory complications will improve 08/27/2017 2235 - Progressing by Ruel FavorsBrown-Staples, Rollo Farquhar, RN Cardiovascular complication will be avoided 08/27/2017 2235 - Progressing by Ruel FavorsBrown-Staples, Jacorie Ernsberger, RN Skin Integrity: Risk for impaired skin integrity will decrease 08/27/2017 2235 - Progressing by Ruel FavorsBrown-Staples, Bronc Brosseau, RN Metabolic: Ability to maintain appropriate glucose levels will improve 08/27/2017 2235 - Progressing by Ruel FavorsBrown-Staples, Khyre Germond, RN Phase I Progression Outcomes Initial discharge plan identified 08/27/2017 2235 -  Progressing by Ruel FavorsBrown-Staples, Mahonri Seiden, RN Hemodynamically stable 08/27/2017 2235 - Progressing by Ruel FavorsBrown-Staples, Arias Weinert, RN Other Phase I Outcomes/Goals 08/27/2017 2235 - Progressing by Ruel FavorsBrown-Staples, Shaquina Gillham, RN Phase II Progression Outcomes Therapeutic drug levels for anticoagulation 08/27/2017 2235 - Progressing by Ruel FavorsBrown-Staples, Onis Markoff, RN Discharge plan established 08/27/2017 2235 - Progressing by Ruel FavorsBrown-Staples, Ramisa Duman, RN Other Phase II Outcomes/Goals 08/27/2017 2235 - Progressing by Ruel FavorsBrown-Staples, Keyerra Lamere, RN Phase III Progression Outcomes 02 sats stabilized 08/27/2017 2235 - Progressing by Ruel FavorsBrown-Staples, Siarra Gilkerson, RN Activity at appropriate level-compared to baseline Description (UP IN CHAIR FOR HEMODIALYSIS) 08/27/2017 2235 - Progressing by Ruel FavorsBrown-Staples, Mekesha Solomon, RN Discharge plan remains appropriate-arrangements made 08/27/2017 2235 - Progressing by Ruel FavorsBrown-Staples, Nuriyah Hanline, RN Other Phase III Outcomes/Goals 08/27/2017 2235 - Progressing by Ruel FavorsBrown-Staples, Peniel Hass, RN Discharge Progression Outcomes Barriers To Progression Addressed/Resolved 08/27/2017 2235 - Progressing by Ruel FavorsBrown-Staples, Rea Kalama, RN Discharge plan in place and appropriate 08/27/2017 2235 - Progressing by Ruel FavorsBrown-Staples, Tyneshia Stivers, RN Hemodynamically stable 08/27/2017 2235 - Progressing by Ruel FavorsBrown-Staples, Mahitha Hickling, RN Complications resolved/controlled 08/27/2017 2235 - Progressing by Ruel FavorsBrown-Staples, Dakai Braithwaite, RN Activity appropriate for discharge plan 08/27/2017 2235 - Progressing by Ruel FavorsBrown-Staples, Zakiah Gauthreaux, RN

## 2017-08-28 ENCOUNTER — Inpatient Hospital Stay (HOSPITAL_COMMUNITY): Payer: Medicaid Other

## 2017-08-28 LAB — CBC WITH DIFFERENTIAL/PLATELET
BASOS ABS: 0 10*3/uL (ref 0.0–0.1)
BASOS PCT: 0 %
EOS ABS: 0.4 10*3/uL (ref 0.0–0.7)
EOS PCT: 4 %
HCT: 29 % — ABNORMAL LOW (ref 39.0–52.0)
Hemoglobin: 9 g/dL — ABNORMAL LOW (ref 13.0–17.0)
Lymphocytes Relative: 22 %
Lymphs Abs: 2.2 10*3/uL (ref 0.7–4.0)
MCH: 27.4 pg (ref 26.0–34.0)
MCHC: 31 g/dL (ref 30.0–36.0)
MCV: 88.4 fL (ref 78.0–100.0)
MONO ABS: 0.4 10*3/uL (ref 0.1–1.0)
Monocytes Relative: 4 %
Neutro Abs: 6.6 10*3/uL (ref 1.7–7.7)
Neutrophils Relative %: 70 %
PLATELETS: 311 10*3/uL (ref 150–400)
RBC: 3.28 MIL/uL — AB (ref 4.22–5.81)
RDW: 16.7 % — AB (ref 11.5–15.5)
WBC: 9.6 10*3/uL (ref 4.0–10.5)

## 2017-08-28 LAB — COMPREHENSIVE METABOLIC PANEL
ALBUMIN: 2.4 g/dL — AB (ref 3.5–5.0)
ALT: 22 U/L (ref 17–63)
AST: 20 U/L (ref 15–41)
Alkaline Phosphatase: 177 U/L — ABNORMAL HIGH (ref 38–126)
Anion gap: 7 (ref 5–15)
BUN: 15 mg/dL (ref 6–20)
CHLORIDE: 92 mmol/L — AB (ref 101–111)
CO2: 29 mmol/L (ref 22–32)
Calcium: 8.9 mg/dL (ref 8.9–10.3)
Creatinine, Ser: 0.7 mg/dL (ref 0.61–1.24)
GFR calc Af Amer: >60 mL/min (ref >60)
Glucose, Bld: 130 mg/dL — ABNORMAL HIGH (ref 65–99)
POTASSIUM: 4.9 mmol/L (ref 3.5–5.1)
SODIUM: 128 mmol/L — AB (ref 135–145)
Total Bilirubin: 1 mg/dL (ref 0.3–1.2)
Total Protein: 8.4 g/dL — ABNORMAL HIGH (ref 6.5–8.1)

## 2017-08-28 LAB — PHOSPHORUS: Phosphorus: 5 mg/dL — ABNORMAL HIGH (ref 2.5–4.6)

## 2017-08-28 LAB — GLUCOSE, CAPILLARY
GLUCOSE-CAPILLARY: 150 mg/dL — AB (ref 65–99)
Glucose-Capillary: 143 mg/dL — ABNORMAL HIGH (ref 65–99)
Glucose-Capillary: 151 mg/dL — ABNORMAL HIGH (ref 65–99)
Glucose-Capillary: 140 mg/dL — ABNORMAL HIGH (ref 65–99)

## 2017-08-28 LAB — PREPARE RBC (CROSSMATCH)

## 2017-08-28 LAB — HEPARIN LEVEL (UNFRACTIONATED): HEPARIN UNFRACTIONATED: 0.6 [IU]/mL (ref 0.30–0.70)

## 2017-08-28 LAB — CK: CK TOTAL: 14 U/L — AB (ref 49–397)

## 2017-08-28 LAB — MAGNESIUM: MAGNESIUM: 1.7 mg/dL (ref 1.7–2.4)

## 2017-08-28 MED ORDER — DIAZEPAM 2 MG PO TABS
2.0000 mg | ORAL_TABLET | Freq: Once | ORAL | Status: AC
  Administered 2017-08-29: 2 mg via ORAL
  Filled 2017-08-28: qty 1

## 2017-08-28 MED ORDER — MILRINONE LACTATE IN DEXTROSE 20-5 MG/100ML-% IV SOLN
0.1250 ug/kg/min | INTRAVENOUS | Status: DC
  Filled 2017-08-28: qty 100

## 2017-08-28 MED ORDER — DEXMEDETOMIDINE HCL IN NACL 400 MCG/100ML IV SOLN
0.1000 ug/kg/h | INTRAVENOUS | Status: DC
  Filled 2017-08-28: qty 100

## 2017-08-28 MED ORDER — POTASSIUM CHLORIDE 2 MEQ/ML IV SOLN
80.0000 meq | INTRAVENOUS | Status: DC
  Filled 2017-08-28: qty 40

## 2017-08-28 MED ORDER — METOPROLOL TARTRATE 12.5 MG HALF TABLET
12.5000 mg | ORAL_TABLET | Freq: Once | ORAL | Status: AC
  Administered 2017-08-29: 12.5 mg via ORAL
  Filled 2017-08-28: qty 1

## 2017-08-28 MED ORDER — TRANEXAMIC ACID 1000 MG/10ML IV SOLN
1.5000 mg/kg/h | INTRAVENOUS | Status: AC
  Administered 2017-08-29: 1.5 mg/kg/h via INTRAVENOUS
  Filled 2017-08-28: qty 25

## 2017-08-28 MED ORDER — VANCOMYCIN HCL 10 G IV SOLR
1500.0000 mg | INTRAVENOUS | Status: AC
  Administered 2017-08-29: 1500 mg via INTRAVENOUS
  Filled 2017-08-28: qty 1500

## 2017-08-28 MED ORDER — DEXTROSE 5 % IV SOLN
1.5000 g | INTRAVENOUS | Status: AC
  Administered 2017-08-29: 1.5 g via INTRAVENOUS
  Filled 2017-08-28: qty 1.5

## 2017-08-28 MED ORDER — CHLORHEXIDINE GLUCONATE 4 % EX LIQD
60.0000 mL | Freq: Once | CUTANEOUS | Status: AC
  Administered 2017-08-28: 4 via TOPICAL
  Filled 2017-08-28: qty 60

## 2017-08-28 MED ORDER — MAGNESIUM SULFATE 50 % IJ SOLN
40.0000 meq | INTRAMUSCULAR | Status: DC
  Filled 2017-08-28: qty 9.85

## 2017-08-28 MED ORDER — SODIUM CHLORIDE 0.9 % IV SOLN
30.0000 ug/min | INTRAVENOUS | Status: DC
  Filled 2017-08-28: qty 2

## 2017-08-28 MED ORDER — CHLORHEXIDINE GLUCONATE 0.12 % MT SOLN
15.0000 mL | Freq: Once | OROMUCOSAL | Status: AC
  Administered 2017-08-29: 15 mL via OROMUCOSAL
  Filled 2017-08-28: qty 15

## 2017-08-28 MED ORDER — SODIUM CHLORIDE 0.9 % IV SOLN
INTRAVENOUS | Status: DC
  Filled 2017-08-28: qty 30

## 2017-08-28 MED ORDER — SODIUM CHLORIDE 0.9 % IV BOLUS (SEPSIS)
250.0000 mL | Freq: Once | INTRAVENOUS | Status: AC
  Administered 2017-08-28: 250 mL via INTRAVENOUS

## 2017-08-28 MED ORDER — BISACODYL 5 MG PO TBEC
5.0000 mg | DELAYED_RELEASE_TABLET | Freq: Once | ORAL | Status: AC
  Administered 2017-08-28: 5 mg via ORAL
  Filled 2017-08-28: qty 1

## 2017-08-28 MED ORDER — NITROGLYCERIN IN D5W 200-5 MCG/ML-% IV SOLN
2.0000 ug/min | INTRAVENOUS | Status: DC
  Filled 2017-08-28: qty 250

## 2017-08-28 MED ORDER — METOLAZONE 5 MG PO TABS
5.0000 mg | ORAL_TABLET | Freq: Once | ORAL | Status: AC
  Administered 2017-08-28: 5 mg via ORAL
  Filled 2017-08-28: qty 1

## 2017-08-28 MED ORDER — DEXTROSE 5 % IV SOLN
750.0000 mg | INTRAVENOUS | Status: DC
  Filled 2017-08-28: qty 750

## 2017-08-28 MED ORDER — ONDANSETRON HCL 4 MG/2ML IJ SOLN
4.0000 mg | Freq: Four times a day (QID) | INTRAMUSCULAR | Status: DC | PRN
  Administered 2017-08-28: 4 mg via INTRAVENOUS
  Filled 2017-08-28: qty 2

## 2017-08-28 MED ORDER — EPINEPHRINE PF 1 MG/ML IJ SOLN
0.0000 ug/min | INTRAVENOUS | Status: DC
  Filled 2017-08-28: qty 4

## 2017-08-28 MED ORDER — DOPAMINE-DEXTROSE 3.2-5 MG/ML-% IV SOLN
0.0000 ug/kg/min | INTRAVENOUS | Status: DC
  Filled 2017-08-28: qty 250

## 2017-08-28 MED ORDER — TRANEXAMIC ACID (OHS) BOLUS VIA INFUSION
15.0000 mg/kg | INTRAVENOUS | Status: AC
  Administered 2017-08-29: 1588.5 mg via INTRAVENOUS
  Filled 2017-08-28: qty 1589

## 2017-08-28 MED ORDER — PLASMA-LYTE 148 IV SOLN
INTRAVENOUS | Status: DC
  Filled 2017-08-28: qty 2.5

## 2017-08-28 MED ORDER — CHLORHEXIDINE GLUCONATE 4 % EX LIQD
60.0000 mL | Freq: Once | CUTANEOUS | Status: AC
  Administered 2017-08-29: 4 via TOPICAL
  Filled 2017-08-28: qty 60

## 2017-08-28 MED ORDER — TRANEXAMIC ACID (OHS) PUMP PRIME SOLUTION
2.0000 mg/kg | INTRAVENOUS | Status: DC
  Filled 2017-08-28: qty 2.12

## 2017-08-28 MED ORDER — SODIUM CHLORIDE 0.9 % IV SOLN
INTRAVENOUS | Status: DC
  Filled 2017-08-28: qty 1

## 2017-08-28 MED ORDER — TEMAZEPAM 7.5 MG PO CAPS
15.0000 mg | ORAL_CAPSULE | Freq: Once | ORAL | Status: AC | PRN
  Administered 2017-08-28: 15 mg via ORAL
  Filled 2017-08-28: qty 2

## 2017-08-28 MED ORDER — MAGNESIUM SULFATE 2 GM/50ML IV SOLN
2.0000 g | Freq: Once | INTRAVENOUS | Status: AC
  Administered 2017-08-28: 2 g via INTRAVENOUS
  Filled 2017-08-28: qty 50

## 2017-08-28 NOTE — Progress Notes (Signed)
Procedure(s) (LRB): MITRAL VALVE (MV) REPLACEMENT (N/A) TRANSESOPHAGEAL ECHOCARDIOGRAM (TEE) (N/A) Subjective: Afebrile, some nausea today probably related to Heart failure Last CXR images personally reviewed Patient walked in hall today He appears ready for Mitral Valve replacement with a bioprosthesis Objective: Vital signs in last 24 hours: Temp:  [97.6 F (36.4 C)-98.4 F (36.9 C)] 97.6 F (36.4 C) (01/07 1105) Pulse Rate:  [75-91] 75 (01/07 1105) Cardiac Rhythm: Normal sinus rhythm (01/07 0702) Resp:  [20-30] 20 (01/07 1105) BP: (96-112)/(63-79) 100/70 (01/07 1105) SpO2:  [83 %-96 %] 94 % (01/07 1105) Weight:  [233 lb 6.4 oz (105.9 kg)] 233 lb 6.4 oz (105.9 kg) (01/07 0405)  Hemodynamic parameters for last 24 hours:  nsr  Intake/Output from previous day: 01/06 0701 - 01/07 0700 In: 2013.6 [P.O.:657; I.V.:1116.6; IV Piggyback:240] Out: 1150 [Urine:1150] Intake/Output this shift: No intake/output data recorded.       Exam    General- alert and comfortable   Lungs- clear without rales, wheezes   Cor- regular rate and rhythm, 3/6 MR  murmur , no gallop   Abdomen- soft, non-tender   Extremities - warm, non-tender, minimal edema   Neuro- oriented, appropriate, no focal weakness   Lab Results: Recent Labs    08/27/17 0224 08/28/17 0807  WBC 9.3 9.6  HGB 8.7* 9.0*  HCT 28.1* 29.0*  PLT 299 311   BMET:  Recent Labs    08/27/17 0224 08/28/17 0807  NA 127* 128*  K 4.5 4.9  CL 93* 92*  CO2 26 29  GLUCOSE 149* 130*  BUN 14 15  CREATININE 0.72 0.70  CALCIUM 8.7* 8.9    PT/INR: No results for input(s): LABPROT, INR in the last 72 hours. ABG    Component Value Date/Time   PHART 7.417 08/20/2017 0130   HCO3 26.6 08/20/2017 0130   TCO2 31 08/02/2017 1553   O2SAT 100.0 08/20/2017 0130   CBG (last 3)  Recent Labs    08/27/17 2039 08/28/17 0615 08/28/17 1115  GLUCAP 204* 143* 150*    Assessment/Plan: S/P Procedure(s) (LRB): MITRAL VALVE (MV)  REPLACEMENT (N/A) TRANSESOPHAGEAL ECHOCARDIOGRAM (TEE) (N/A) I have discussed again the plan for MVR tomorrow including risks and benefits- with the patient and wife. The understand and agree to proceed.   LOS: 8 days    Kathlee Nationseter Van Trigt III 08/28/2017

## 2017-08-28 NOTE — Progress Notes (Signed)
Patient ID: Reginald [name], male   DOB: 10/15/1960, 57 y.o.   MRN: [id number]     Advanced Heart Failure Rounding Note  HF Cardiology: Shirlee Latch  Subjective:    Remains on IV heparin with DVT. Yesterday lasix cut back to 80 mg daily. Received metolazone this morning per PVT.   Denies SOB. Denies CP.   MRI spine 08/24/17 with continued evidence of Osteomyelitis and joint infection in C1-C2, but positive response to treatment. Less regional edema and diminishing paraspinal fluid collections.  Less edema at C4-C5 level.  Objective:   Weight Range: 233 lb 6.4 oz (105.9 kg) Body mass index is 34.47 kg/m.   Vital Signs:   Temp:  [98.1 F (36.7 C)-99 F (37.2 C)] 98.4 F (36.9 C) (01/07 0803) Pulse Rate:  [79-91] 85 (01/07 0803) Resp:  [18-30] 26 (01/07 0803) BP: (96-115)/(63-79) 103/79 (01/07 0803) SpO2:  [83 %-96 %] 83 % (01/07 0936) Weight:  [233 lb 6.4 oz (105.9 kg)] 233 lb 6.4 oz (105.9 kg) (01/07 0405) Last BM Date: 08/27/16  Weight change: Filed Weights   08/26/17 0431 08/27/17 0435 08/28/17 0405  Weight: 230 lb 11.2 oz (104.6 kg) 227 lb 15.3 oz (103.4 kg) 233 lb 6.4 oz (105.9 kg)    Intake/Output:   Intake/Output Summary (Last 24 hours) at 08/28/2017 1007 Last data filed at 08/28/2017 0400 Gross per 24 hour  Intake 60 ml  Output 1000 ml  Net -940 ml    Physical Exam    General:  Sitting in the chair.  No resp difficulty HEENT: normal Neck: supple. JVP 8-9. Carotids 2+ bilat; no bruits. No lymphadenopathy or thryomegaly appreciated. Cor: PMI nondisplaced. Regular rate & rhythm. No rubs, gallops or murmurs. Lungs: clear Abdomen: soft, nontender, nondistended. No hepatosplenomegaly. No bruits or masses. Good bowel sounds. Extremities: no cyanosis, clubbing, rash, edema Neuro: alert & orientedx3, cranial nerves grossly intact. moves all 4 extremities w/o difficulty. Affect pleasant  Telemetry   NSR 80s, personally reviewed.   Labs    CBC Recent Labs     08/27/17 0224 08/28/17 0807  WBC 9.3 9.6  NEUTROABS 5.6 6.6  HGB 8.7* 9.0*  HCT 28.1* 29.0*  MCV 88.6 88.4  PLT 299 311   Basic Metabolic Panel Recent Labs    40/98/11 0224 08/28/17 0807  NA 127* 128*  K 4.5 4.9  CL 93* 92*  CO2 26 29  GLUCOSE 149* 130*  BUN 14 15  CREATININE 0.72 0.70  CALCIUM 8.7* 8.9  MG 1.8 1.7  PHOS 4.1 5.0*   Liver Function Tests Recent Labs    08/27/17 0224 08/28/17 0807  AST 28 20  ALT 24 22  ALKPHOS 196* 177*  BILITOT 0.8 1.0  PROT 8.5* 8.4*  ALBUMIN 2.5* 2.4*   No results for input(s): LIPASE, AMYLASE in the last 72 hours. Cardiac Enzymes Recent Labs    08/28/17 0807  CKTOTAL 14*    BNP: BNP (last 3 results) Recent Labs    07/26/17 0340 08/19/17 2300  BNP 104.6* 204.8*    ProBNP (last 3 results) No results for input(s): PROBNP in the last 8760 hours.   D-Dimer No results for input(s): DDIMER in the last 72 hours. Hemoglobin A1C No results for input(s): HGBA1C in the last 72 hours. Fasting Lipid Panel No results for input(s): CHOL, HDL, LDLCALC, TRIG, CHOLHDL, LDLDIRECT in the last 72 hours. Thyroid Function Tests No results for input(s): TSH, T4TOTAL, T3FREE, THYROIDAB in the last 72 hours.  Invalid input(s): FREET3  Other  results:   Imaging    No results found.   Medications:     Scheduled Medications: . budesonide  0.25 mg Nebulization BID  . chlorhexidine  60 mL Topical Once   And  . [START ON 08/29/2017] chlorhexidine  60 mL Topical Once  . chlorhexidine  15 mL Mouth/Throat BID  . [START ON 08/29/2017] chlorhexidine  15 mL Mouth/Throat Once  . [START ON 08/29/2017] diazepam  2 mg Oral Once  . feeding supplement (GLUCERNA SHAKE)  237 mL Oral TID WC  . ferrous fumarate-b12-vitamic C-folic acid  1 capsule Oral TID PC  . furosemide  80 mg Oral Daily  . insulin aspart  0-15 Units Subcutaneous TID WC  . insulin glargine  14 Units Subcutaneous Daily  . lidocaine  1 patch Transdermal Q24H  . lisinopril  5  mg Oral Daily  . [START ON 08/29/2017] metoprolol tartrate  12.5 mg Oral Once  . metoprolol tartrate  25 mg Oral BID  . potassium chloride  20 mEq Oral BID  . spironolactone  12.5 mg Oral Daily    Infusions: . sodium chloride    . DAPTOmycin (CUBICIN)  IV Stopped (08/27/17 1946)  . heparin 2,700 Units/hr (08/28/17 0408)    PRN Medications: sodium chloride, acetaminophen, ipratropium-albuterol, temazepam, traMADol    Patient Profile   [name] Mcclimans is a 57 y.o. 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   [facility] with resp distress and edema  Plan for potential MVR week of 1/7  Assessment/Plan   1. Acute on chronic diastolic CHF: In setting of severe MR.  Ultimately, he needs mitral valve replacement. - Volume status stable. Continue lasix 80 mg daily. Received dose of metolazone today per Dr Maren BeachVantrigt.  - Continue 12.5 mg spiro daily.  - Creatinine stable.  2. 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  Last blood cultures 12/25 NGTD.  Afebrile.  He continues on Taflaro and daptomycin.  Severe MR, suspect perforated leaflet.  Will need MVR.  - Tentatively plan surgery Tuesday, 08/29/17.  - Continue lisinopril to 5 mg daily for afterload reduction.  3. Anemia: Suspect anemia of chronic disease, got 1 unit PRBCs 12/31 - Hgb 9 this am.   - FOBT was recently negative. On Iron TID.  Transferrin saturation was low.  4. Deconditioning - PT recommends HH.  5. Hyponatremia  - Na 128. Continue free water restriction.  6. DVT: Left PT vein  (below knee). - Continue IV heparin with upcoming surgery.   We will follow after surgery.   Length of Stay: 8  Amy Clegg, NP  08/28/2017, 10:07 AM  Advanced Heart Failure Team Pager [phone] (M-F; 7a - 4p)  Please contact [facility] for night-coverage after hours (4p -7a ) and weekends on [email or web address]  Patient seen with NP, agree with the above note.  Weight up a bit today, got dose of metolazone along with his po Lasix today. Plan for MV surgery tomorrow.  We will see again after surgery.  Continue fluid restriction for low sodium.   Marca AnconaDalton McLean 08/28/2017

## 2017-08-28 NOTE — Care Management Note (Signed)
Case Management Note Previous CM note completed by Leone Havenaylor, Deborah Clinton, RN--08/23/2017, 10:45 AM   Patient Details  Name: Reginald [name] MRN: [id number] Date of Birth: 03/30/1961  Subjective/Objective:  From home with his girlfriend, pta indep, presents with Acute on chronic diastolic CHF with severe MR, he has MV endocarditis. He has no insurance,  He is for surgery next Tuesday.  Per MD with HF, patient can go home and come back for surgery.  Patient states his sister will be with him 24/7 til he comes back for surgery.  He will most likely need HHRN  (charity) and rolling walker.  NCM [facility] at [facility] , he can also utilize the [facility] for medication assist. Per attending they are adjusting his medications today for renal function and he is to get MRI, so he may possibly dc tomorrow. N[facility], she state they do not service his area. NCM spoke with Madison HickmanPam Chadler she states we would probably need to do (LOG) [facility] if he Is going to need a HHRN.  NCM informed Pam not sure yet of the plan, will wait to see what MD says.                     Action/Plan: NCM will follow for dc needs.   Expected Discharge Date:                  Expected Discharge Plan:     In-House Referral:     Discharge planning Services  CM Consult  Post Acute Care Choice:    Choice offered to:     DME Arranged:    DME Agency:     HH Arranged:    HH Agency:     Status of Service:  In process, will continue to follow  If discussed at Long Length of Stay Meetings, dates discussed:    Discharge Disposition:   Additional Comments:  08/28/16- 1250- Donn PieriniKristi Jarrett Albor RN, CM- pt with acute DVT- started on IV heparin last week with plan for MVR surgery on 08/29/17- CM will follow post op for transition of care needs- [facility] follow.   Zenda AlpersWebster, MadisonKristi Hall, RN 08/28/2017, 12:49 PM [phone]

## 2017-08-28 NOTE — Plan of Care (Signed)
Progressing Activity: Capacity to carry out activities will improve 08/28/2017 1423 - Progressing by Donell Beers, RN Cardiac: Ability to achieve and maintain adequate cardiopulmonary perfusion will improve 08/28/2017 1423 - Progressing by Donell Beers, RN Coping: Ability to identify and develop effective coping behavior will improve 08/28/2017 1423 - Progressing by Donell Beers, RN Self-Concept: Ability to identify factors that promote anxiety will improve 08/28/2017 1423 - Progressing by Donell Beers, RN Level of anxiety will decrease 08/28/2017 1423 - Progressing by Donell Beers, RN Ability to modify response to factors that promote anxiety will improve 08/28/2017 1423 - Progressing by Donell Beers, RN Education: Knowledge of General Education information will improve 08/28/2017 1423 - Progressing by Donell Beers, RN Health Behavior/Discharge Planning: Ability to manage health-related needs will improve 08/28/2017 1423 - Progressing by Donell Beers, RN Clinical Measurements: Ability to maintain clinical measurements within normal limits will improve 08/28/2017 1423 - Progressing by Donell Beers, RN Will remain free from infection 08/28/2017 1423 - Progressing by Donell Beers, RN Diagnostic test results will improve 08/28/2017 1423 - Progressing by Donell Beers, RN Respiratory complications will improve 08/28/2017 1423 - Progressing by Donell Beers, RN Cardiovascular complication will be avoided 08/28/2017 1423 - Progressing by Donell Beers, RN Skin Integrity: Risk for impaired skin integrity will decrease 08/28/2017 1423 - Progressing by Donell Beers, RN Metabolic: Ability to maintain appropriate glucose levels will improve 08/28/2017 1423 - Progressing by Donell Beers, RN Phase I Progression Outcomes Initial discharge plan identified 08/28/2017 1423 - Progressing by Donell Beers,  RN Hemodynamically stable 08/28/2017 1423 - Progressing by Donell Beers, RN Other Phase I Outcomes/Goals 08/28/2017 1423 - Progressing by Donell Beers, RN Phase II Progression Outcomes Therapeutic drug levels for anticoagulation 08/28/2017 1423 - Progressing by Donell Beers, RN Discharge plan established 08/28/2017 1423 - Progressing by Donell Beers, RN Other Phase II Outcomes/Goals 08/28/2017 1423 - Progressing by Donell Beers, RN Phase III Progression Outcomes 02 sats stabilized 08/28/2017 1423 - Progressing by Donell Beers, RN Activity at appropriate level-compared to baseline Description (UP IN CHAIR FOR HEMODIALYSIS) 08/28/2017 1423 - Progressing by Donell Beers, RN Discharge plan remains appropriate-arrangements made 08/28/2017 1423 - Progressing by Donell Beers, RN Other Phase III Outcomes/Goals 08/28/2017 1423 - Progressing by Donell Beers, RN Discharge Progression Outcomes Barriers To Progression Addressed/Resolved 08/28/2017 1423 - Progressing by Donell Beers, RN Discharge plan in place and appropriate 08/28/2017 1423 - Progressing by Donell Beers, RN Hemodynamically stable 08/28/2017 1423 - Progressing by Donell Beers, RN Complications resolved/controlled 08/28/2017 1423 - Progressing by Donell Beers, RN Activity appropriate for discharge plan 08/28/2017 1423 - Progressing by Donell Beers, RN   Progressing Activity: Capacity to carry out activities will improve 08/28/2017 1423 - Progressing by Donell Beers, RN Cardiac: Ability to achieve and maintain adequate cardiopulmonary perfusion will improve 08/28/2017 1423 - Progressing by Donell Beers, RN Coping: Ability to identify and develop effective coping behavior will improve 08/28/2017 1423 - Progressing by Donell Beers, RN Self-Concept: Ability to identify factors that promote anxiety will improve 08/28/2017 1423 - Progressing  by Donell Beers, RN Level of anxiety will decrease 08/28/2017 1423 - Progressing by Donell Beers, RN Ability to modify response to factors that promote anxiety will improve 08/28/2017 1423 - Progressing by Donell Beers, RN Education: Knowledge of General Education information will improve 08/28/2017 1423 -  Progressing by Donell BeersPaseda, Cotey Rakes R, RN Health Behavior/Discharge Planning: Ability to manage health-related needs will improve 08/28/2017 1423 - Progressing by Donell BeersPaseda, William Schake R, RN Clinical Measurements: Ability to maintain clinical measurements within normal limits will improve 08/28/2017 1423 - Progressing by Donell BeersPaseda, Tripp Goins R, RN Will remain free from infection 08/28/2017 1423 - Progressing by Donell BeersPaseda, Mert Dietrick R, RN Diagnostic test results will improve 08/28/2017 1423 - Progressing by Donell BeersPaseda, Grason Brailsford R, RN Respiratory complications will improve 08/28/2017 1423 - Progressing by Donell BeersPaseda, Ambria Mayfield R, RN Cardiovascular complication will be avoided 08/28/2017 1423 - Progressing by Donell BeersPaseda, Murrell Elizondo R, RN Skin Integrity: Risk for impaired skin integrity will decrease 08/28/2017 1423 - Progressing by Donell BeersPaseda, Karson Reede R, RN Metabolic: Ability to maintain appropriate glucose levels will improve 08/28/2017 1423 - Progressing by Donell BeersPaseda, Annetta Deiss R, RN Phase I Progression Outcomes Initial discharge plan identified 08/28/2017 1423 - Progressing by Donell BeersPaseda, Tearah Saulsbury R, RN Hemodynamically stable 08/28/2017 1423 - Progressing by Donell BeersPaseda, Jakyrah Holladay R, RN Other Phase I Outcomes/Goals 08/28/2017 1423 - Progressing by Donell BeersPaseda, Leily Capek R, RN Phase II Progression Outcomes Therapeutic drug levels for anticoagulation 08/28/2017 1423 - Progressing by Donell BeersPaseda, Chanice Brenton R, RN Discharge plan established 08/28/2017 1423 - Progressing by Donell BeersPaseda, Lyllian Gause R, RN Other Phase II Outcomes/Goals 08/28/2017 1423 - Progressing by Donell BeersPaseda, Simon Aaberg R, RN Phase III Progression Outcomes 02 sats  stabilized 08/28/2017 1423 - Progressing by Donell BeersPaseda, Sumner Kirchman R, RN Activity at appropriate level-compared to baseline Description (UP IN CHAIR FOR HEMODIALYSIS) 08/28/2017 1423 - Progressing by Donell BeersPaseda, Bocephus Cali R, RN Discharge plan remains appropriate-arrangements made 08/28/2017 1423 - Progressing by Donell BeersPaseda, Gordie Belvin R, RN Other Phase III Outcomes/Goals 08/28/2017 1423 - Progressing by Donell BeersPaseda, Ramon Brant R, RN Discharge Progression Outcomes Barriers To Progression Addressed/Resolved 08/28/2017 1423 - Progressing by Donell BeersPaseda, Tenessa Marsee R, RN Discharge plan in place and appropriate 08/28/2017 1423 - Progressing by Donell BeersPaseda, Lashaunta Sicard R, RN Hemodynamically stable 08/28/2017 1423 - Progressing by Donell BeersPaseda, [place]andoah Yeats R, RN Complications resolved/controlled 08/28/2017 1423 - Progressing by Donell BeersPaseda, Maahi Lannan R, RN Activity appropriate for discharge plan 08/28/2017 1423 - Progressing by Donell BeersPaseda, Jayro Mcmath R, RN

## 2017-08-28 NOTE — Progress Notes (Addendum)
ANTICOAGULATION CONSULT NOTE   Pharmacy Consult for Heparin Indication: DVT  Allergies  Allergen Reactions  . Morphine And Related Other (See Comments)    "freaks out"   Patient Measurements: Height: 5\' 9"  (175.3 cm) Weight: 233 lb 6.4 oz (105.9 kg) IBW/kg (Calculated) : 70.7 Heparin Dosing Weight: 96.2 kg  Vital Signs: Temp: 98.4 F (36.9 C) (01/07 0803) Temp Source: Oral (01/07 0803) BP: 103/79 (01/07 0803) Pulse Rate: 85 (01/07 0803)  Labs: Recent Labs    08/26/17 0217  08/27/17 0224 08/27/17 1153 08/27/17 1857 08/28/17 0807  HGB 8.8*  --  8.7*  --   --  9.0*  HCT 28.4*  --  28.1*  --   --  29.0*  PLT 273  --  299  --   --  311  HEPARINUNFRC 0.28*   < > 0.27* 0.51 0.24* 0.60  CREATININE 0.79  --  0.72  --   --  0.70  CKTOTAL  --   --   --   --   --  14*   < > = values in this interval not displayed.    Estimated Creatinine Clearance: 122.2 mL/min (by C-G formula based on SCr of 0.7 mg/dL).   Medical History: Past Medical History:  Diagnosis Date  . Acute osteomyelitis of cervical spine (HCC) 08/10/2017  . Alcohol abuse   . CAD (coronary artery disease)   . Diabetes mellitus type 2 in obese (HCC)   . Mitral regurgitation   . Neck pain   . 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 Tobacco abuse     Medications:  Scheduled:  . budesonide  0.25 mg Nebulization BID  . chlorhexidine  60 mL Topical Once   And  . [START ON 08/29/2017] chlorhexidine  60 mL Topical Once  . chlorhexidine  15 mL Mouth/Throat BID  . [START ON 08/29/2017] chlorhexidine  15 mL Mouth/Throat Once  . [START ON 08/29/2017] diazepam  2 mg Oral Once  . feeding supplement (GLUCERNA SHAKE)  237 mL Oral TID WC  . ferrous fumarate-b12-vitamic C-folic acid  1 capsule Oral TID PC  . furosemide  80 mg Oral Daily  . insulin aspart  0-15 Units Subcutaneous TID WC  . insulin glargine  14 Units Subcutaneous Daily  . lidocaine  1 patch Transdermal Q24H  . lisinopril  5 mg Oral Daily  . [START ON  08/29/2017] metoprolol tartrate  12.5 mg Oral Once  . metoprolol tartrate  25 mg Oral BID  . potassium chloride  20 mEq Oral BID  . spironolactone  12.5 mg Oral Daily    Assessment: 56 yom admitted for evaluation of CHF and pulmonary edema. Left lower extremity duplex found evidence of acute DVT involving the posterior tibial veins of lower extremity. He was on DVT prophylaxis prior to duplex and transitioned to therapeutic dosing 1 mg/kg every 12 hours- last dose was on 1/3 at 0552. Now he continues on IV heparin which is scheduled off tonight   Plan for valve surgery in am. Heparin level is therapeutic this am, no bleeding issues noted.  Patient continues on daptomycin, CK total is 14. Renal function normal, no dose adjustments needed. Planned stop date of 09/10/17  Goal of Therapy:  Heparin level 0.3-0.7 units/ml Monitor platelets by anticoagulation protocol: Yes   Plan:  Continue heparin at 2700 units/hr Off at midnight Continue dapto for now  Thank you for allowing [place] to participate in this patients care.  Sheppard Coil PharmD., BCPS Clinical Pharmacist Pager  [phone] 08/28/2017 10:10 AM

## 2017-08-28 NOTE — Progress Notes (Signed)
Patient c/o of having sudden double vision, nausea and vomiting, Reginald [name] headache, no numbness or tingling, MD notified, [name] orders received , vital signs obtained see flowsheet, will continue to monitor.

## 2017-08-28 NOTE — Progress Notes (Signed)
CARDIAC REHAB PHASE I   PRE:  Rate/Rhythm: 87SR    BP: sitting 103/79    SaO2: 97 5L, 93 2L  MODE:  Ambulation: 160 ft   POST:  Rate/Rhythm: 95 SR    BP: sitting 105/68     SaO2: 89 2L  Pt on EOB. On 5L. Turned down to 2L and 93. Ambulated fairly steady with RW, used gait belt. Rest x2 for SOB. To recliner, SAO2 slightly low at 89 2L. Up to 92 2L resting. Encouraged more IS. Nervous about surgery.  [phone]   Harriet MassonRandi Kristan Frederick Marro CES, ACSM 08/28/2017 9:11 AM

## 2017-08-28 NOTE — Progress Notes (Signed)
PROGRESS NOTE    Shahmeer Bunn  ZOX:[id number] DOB: [birth year] DOA: 08/20/2017 PCP: Patient, No Pcp Per   Brief Narrative:  Tally Mckinnon is a 57 y.o. male with medical history significant of osteomyelitis of the spine, alcohol abuse, type 2 diabetes, paraspinal abscess, tobacco abuse, nonobstructive CAD, mitral regurgitation who was recently admitted from 07/25/2017 to 08/10/2017 due to mitral valve large vegetation/endocarditis, possible aortic valve endocarditis, septic emboli to the brain, multifocal pneumonia, C1 osteomyelitis, C4 discitis and paraspinal abscess due to MRSA bacteremia.  He was discharged to the rehab unit where he has been continuing the Daptomycin IVPB regimen for a total of 6 weeks. He was admitted for Acute Respiratory Failure from CHF Exacerbation and Heart Failure Team has been following. Cardiothoracic Surgery Dr. Donata Clay following and planning to take patient for MVR with Bioprosthetic Valve 08/29/17. Patient found to have an acute DVT and now on Heparin gtt and will continue for Surgery on 08/29/17.   Assessment & Plan:   Principal Problem:   Acute respiratory failure (HCC) Active Problems:   MRSA bacteremia   Mitral valve vegetation   Acute diastolic CHF (congestive heart failure) (HCC)   Endocarditis of mitral valve   Cerebral septic emboli (HCC)   Hyponatremia   Essential hypertension   Type 2 diabetes mellitus with peripheral neuropathy (HCC)  Acute respiratory failure (HCC) likely from CHF Exacerbation -Likely secondary to CHF exacerbation in the setting of severe mitral valve regurgiation -Now on 1 Liter of O2 via [place] -He was sent from inpatient rehab center to hospital and admitted to stepdown, he is started on iv lasix/metolazone and put on bipap,  -Much improved; per Cards, d/c metolazone d/ed, change bipap to prn -Heart Failure Team consulted, meds per heart failure team -Patient is -11.817 Liters since admission and is down 18 lbs. -Per  Cardiology C/w Lasix 80 mg po Daily they po Lasix 40 mg qEvening; After discussion with Dr. Wyline Mood will D/C Evening Lasix dose because of Hyponatremia; -Patient received Metolazone today per TCTS -C/w Spironolactone 12.5 mg po Daily   Bilateral lower extremity edema from CHF with Left worse than Right 2/2 Acute DVT; -Left > right -Venous doppler showed acute DVT involving the posterior tibial veins of the LLE -Given full dose Lovenox and changed to IV Heparin gtt -Dg x ray left foot "Fracture proximal aspect of fifth proximal -phalanx, stable from 1 week prior. Minimal healing response in this area. No other fracture. No dislocation" -Chest CTA showed No evidence of significant pulmonary embolus. Dilated ascending thoracic aorta at 4.4 cm.  Diffuse patchy airspace disease throughout both lungs, progressing since previous study. This could represent edema or multifocal pneumonia. Small bilateral pleural effusions with basilar atelectasis. Secretions versus mucous plugging in the lower lobe bronchioles. Aortic atherosclerosis.  Coronary artery calcification. -C/w Heparin gtt in anticipation of Surgery   MRSA bacteremia  MV endocarditis, Large vegetation Possible AV endocarditis CNS, septic emboli to brain Possible multifocal pneumonia on CT C1 vertebral osteo, C4 discitis, paraspinal abscess -He was hospitalized from 12/4 to 12/20 for this , he was discharged to rehab on iv abx ( Daptomycin) in anticipating cardiac thoracic surgery once he regain some strength. -Blood culture has been negative since 12/13 -Per last infectious disease note on 12/20 by Dr Zenaida Niece dam:  C/w Monotherapy Daptomycin through Surgery and 6 weeks after and send valve tissue for culture from [facility] Cervical spine osteo and paraspinal abscess -Repeat MRI Done w/o Contrast showed continued evidence of osteomyelitis and joint  infection in the C1-2 region, with evidence of positive response to treatment since the study of  08/08/2017.There is less regional edema and the small paraspinous fluid collections are diminishing. No progressive or worsening findings. There is also less edema at the C4-5 level.No worsening or new finding. -Dr. Daiva EvesVan Dam recommended getting a repeat MRI prior to Stopping IV Abx -Monitor CK weekly. Last one was 36 -Continue Daptomycin for now -Thoracic surgery consulted, will follow recommendation and plan is for MVR with Bioprosthetic Valve on 08/29/16 -Added Tramadol for Pain  Vertebral Osteomyelitis -This was noted on MRI C-spine ordered by ID. There was osteomyelitis of the odontoid process. My Colleague discussed Case with Neurosurgery who reviewed films during last hospitalization, recommended repeat MRI C-spine in 4-6 weeks (Jan 10-20th roughly). -However, in light of neck hyperextension for anesthesia, would recommend repeat MRI imaging before surgery -MRI  cervical spine ordered on 1/2 and done 1/3 showed The patient has osteomyelitis at the C1-2 level with septic arthritis of the skullbase C1 articulation and C1-C2 articulation. Today's study was ordered and performed without contrast, hindering evaluation to some degree. I think there is slightly less edema in the region and I think that paraspinous fluid collections are smaller. I do not see any progressive disease or new finding. No cord compression. Mild edema at the C4-5 level also appears slightly improved without progression or new finding. Chronic discogenic endplate changes at C5-6 appear the same. -Was Consider Repeating MRI with Contrast prior to Surgery on 08/29/16 but after discussion with ID Dr. Luciana Axeomer will hold off for now.  -Added Tramadol for Pain  HTN:  -Controlled and on the lower side -C/w Metoprolol 25 mg po BID, Lisinopril 5 mg po Daily, and Furosemide 80 mg po Daily; Lasix 40 mg po qEvening now D/C'd by Cardiology but patient received Metolazone today   Diabetes: -He was started on insulin during last  hospitalization, prior to that he was not on insulin -Hba1c 5.8 ( may not be reliable due to anemia, hgb 7.6) -C/w Lantus 14 units sq Daily and with Moderate Novolog SSI AC -CBG's ranging from 140-151  Normocytic Anemia -No sign of bleeding -Transfused prbc x1 on 12/31 -Hb/Hct was 9.2/29.5 -> 8.8/28.4 -> 8.7/28.1 -> 9.0/29.0 -Continue to Monitor for S/Sx of Bleeding as patient is on Heparin gtt -Repeat CBC in AM  H/o Alcohol Use -Has alcohol delirum needing precedex drip and intubation during last hospitalization. -Not an acute issue since he has been at rehab since last  Discharge on 12/20.   Hyponatremia  -Patient's Na+ was 129 and has been for the last 4 days and now was 128 after holding evening Lasix -Likley from Diuresis/Volume Overload; After discussion with Cardiology Dr. Wyline MoodBranch will discontinue evening dose of Lasix -Continue to Monitor and Repeat in AM   Hypomagnesemia  -Patient's Mag Level was 1.7 -Continue to Monitor and Replete as Necessary -Repeat Mag Level in AM  Blurred Vision, N/V and Dizziness; ? Migraine -After working with Cardiac Rehab and getting Diuresis -Started Antiemetics with IV Zofran -Bolus 250 mL over 4 hours -Repeat Head CT showed no acute Intracranial Abnormality  DVT prophylaxis: Anticoagulated with Heparin gtt Code Status: FULL CODE Family Communication: No Family present at bedside Disposition Plan: Remain Inpatient for anticipated Surgery 08/29/17 and Home Health PT at D/C  Consultants:   Dentistry Dr. Cindra Evesonald Kulinski  Cardiology Dr. Freida Busmanalton McLean/Dr. Eamc - [place]ier[place]anch  Cardiothoracic Surgery Dr. Donata ClayVan Trigt    Procedures:  None   Antimicrobials:  Anti-infectives (From admission,  onward)   Start     Dose/Rate Route Frequency Ordered Stop   08/29/17 0400  vancomycin (VANCOCIN) 1,500 mg in sodium chloride 0.9 % 250 mL IVPB     1,500 mg 125 mL/hr over 120 Minutes Intravenous To Surgery 08/28/17 1239 08/30/17 0400   08/29/17 0400  cefUROXime  (ZINACEF) 1.5 g in dextrose 5 % 50 mL IVPB     1.5 g 100 mL/hr over 30 Minutes Intravenous To Surgery 08/28/17 1239 08/30/17 0400   08/29/17 0400  cefUROXime (ZINACEF) 750 mg in dextrose 5 % 50 mL IVPB     750 mg 100 mL/hr over 30 Minutes Intravenous To Surgery 08/28/17 1238 08/30/17 0400   08/20/17 1500  DAPTOmycin (CUBICIN) 1,000 mg in sodium chloride 0.9 % IVPB     1,000 mg 240 mL/hr over 30 Minutes Intravenous Every 24 hours 08/20/17 0117 09/11/17 1829   08/20/17 1000  ceftaroline (TEFLARO) 600 mg in sodium chloride 0.9 % 250 mL IVPB  Status:  Discontinued     600 mg 250 mL/hr over 60 Minutes Intravenous Every 12 hours 08/20/17 0451 08/23/17 1253     Subjective: Seen and examined and felt tired after working with Cardiac Rehab. Stated headache had improved but later on nurse paged and stated he had blurred vision, nausea, and vomiting. He improved with antimetics and small fluid bolus. No CP or SOB.   Objective: Vitals:   08/28/17 1105 08/28/17 1600 08/28/17 1939 08/28/17 2047  BP: 100/70 102/74 100/69   Pulse: 75 82 86   Resp: 20 12 (!) 22   Temp: 97.6 F (36.4 C) 98.7 F (37.1 C) 98.4 F (36.9 C)   TempSrc: Oral Oral Oral   SpO2: 94% 94% 92% (!) 88%  Weight:      Height:        Intake/Output Summary (Last 24 hours) at 08/28/2017 2110 Last data filed at 08/28/2017 2056 Gross per 24 hour  Intake 1214.35 ml  Output 800 ml  Net 414.35 ml   Filed Weights   08/26/17 0431 08/27/17 0435 08/28/17 0405  Weight: 104.6 kg (230 lb 11.2 oz) 103.4 kg (227 lb 15.3 oz) 105.9 kg (233 lb 6.4 oz)   Examination: Physical Exam:  Constitutional: WN/WD Caucasian male in NAD appears calm sitting up in chair Eyes: Sclerae anicteric. Lids normal ENMT: External Ears and nose appear normal. MMM Neck: Supple with no JVD Respiratory: CTAB; No appreciable wheezing/rales/rhonchi Cardiovascular: RRR; Has a systolic murmur. No LE edema Abdomen: Soft, NT, ND. Bowel sounds present GU: Deferred.    Musculoskeletal: No contractures; no cyanosis Skin: Warm and Dry; No appreciable rashes or lesions on a limited skin eval Neurologic: CN 2-12 grossly intact. No appreciable focal deficits Psychiatric: Flat affect. Intact judgement and insight  Data Reviewed: I have personally reviewed following labs and imaging studies  CBC: Recent Labs  Lab 08/24/17 0234 08/25/17 0938 08/26/17 0217 08/27/17 0224 08/28/17 0807  WBC 7.5 8.3 8.2 9.3 9.6  NEUTROABS  --  5.5 4.9 5.6 6.6  HGB 9.1* 9.2* 8.8* 8.7* 9.0*  HCT 28.9* 29.5* 28.4* 28.1* 29.0*  MCV 87.3 88.3 87.7 88.6 88.4  PLT 266 283 273 299 311   Basic Metabolic Panel: Recent Labs  Lab 08/22/17 0211  08/24/17 0234 08/25/17 0938 08/26/17 0217 08/27/17 0224 08/28/17 0807  NA 130*   < > 129* 129* 129* 127* 128*  K 3.7   < > 4.0 4.3 4.4 4.5 4.9  CL 86*   < > 90* 90* 92*  93* 92*  CO2 34*   < > 31 28 30 26 29   GLUCOSE 207*   < > 138* 185* 156* 149* 130*  BUN 18   < > 18 15 15 14 15   CREATININE 0.91   < > 0.83 0.77 0.79 0.72 0.70  CALCIUM 8.6*   < > 8.3* 8.5* 8.5* 8.7* 8.9  MG 2.1  --   --  1.7 1.6* 1.8 1.7  PHOS  --   --   --  3.0 3.8 4.1 5.0*   < > = values in this interval not displayed.   GFR: Estimated Creatinine Clearance: 122.2 mL/min (by C-G formula based on SCr of 0.7 mg/dL). Liver Function Tests: Recent Labs  Lab 08/25/17 0938 08/26/17 0217 08/27/17 0224 08/28/17 0807  AST 29 25 28 20   ALT 24 26 24 22   ALKPHOS 191* 205* 196* 177*  BILITOT 1.0 0.9 0.8 1.0  PROT 7.8 7.9 8.5* 8.4*  ALBUMIN 2.3* 2.3* 2.5* 2.4*   No results for input(s): LIPASE, AMYLASE in the last 168 hours. No results for input(s): AMMONIA in the last 168 hours. Coagulation Profile: No results for input(s): INR, PROTIME in the last 168 hours. Cardiac Enzymes: Recent Labs  Lab 08/28/17 0807  CKTOTAL 14*   BNP (last 3 results) No results for input(s): PROBNP in the last 8760 hours. HbA1C: No results for input(s): HGBA1C in the last 72  hours. CBG: Recent Labs  Lab 08/27/17 2039 08/28/17 0615 08/28/17 1115 08/28/17 1627 08/28/17 2058  GLUCAP 204* 143* 150* 151* 140*   Lipid Profile: No results for input(s): CHOL, HDL, LDLCALC, TRIG, CHOLHDL, LDLDIRECT in the last 72 hours. Thyroid Function Tests: No results for input(s): TSH, T4TOTAL, FREET4, T3FREE, THYROIDAB in the last 72 hours. Anemia Panel: No results for input(s): VITAMINB12, FOLATE, FERRITIN, TIBC, IRON, RETICCTPCT in the last 72 hours. Sepsis Labs: No results for input(s): PROCALCITON, LATICACIDVEN in the last 168 hours.  No results found for this or any previous visit (from the past 240 hour(s)).  Radiology Studies: Dg Chest 2 View  Result Date: 08/27/2017 CLINICAL DATA:  57 year old male with mitral valve endocarditis, chest pain EXAM: CHEST  2 VIEW COMPARISON:  Recent prior chest x-ray 08/22/2017; chest CT 08/23/2017 FINDINGS: Stable cardiac and mediastinal contours. Atherosclerotic calcifications are present in the transverse aorta. Slight interval increase in diffuse bilateral interstitial and airspace opacities with relatively increased confluence in the right upper lobe. Small left pleural effusion with associated basilar atelectasis. No acute osseous abnormality. IMPRESSION: 1. Similar to slightly increased bilateral interstitial and airspace opacities which remain most consistent with pulmonary edema. 2. Increase confluence of the airspace opacities in the periphery of the right upper lung again noted. This may represent asymmetric pulmonary edema, or a focus of active infection/inflammation such as pneumonia. 3. Persistent small left larger than right bilateral pleural effusions and associated bibasilar atelectasis. 4.  Aortic Atherosclerosis (ICD10-170.0) Electronically Signed   By: Malachy Moan M.D.   On: 08/27/2017 10:20   Ct Head Wo Contrast  Result Date: 08/28/2017 CLINICAL DATA:  Posterior headache EXAM: CT HEAD WITHOUT CONTRAST TECHNIQUE:  Contiguous axial images were obtained from the base of the skull through the vertex without intravenous contrast. COMPARISON:  None. FINDINGS: Brain: No acute intracranial abnormality. Specifically, no hemorrhage, hydrocephalus, mass lesion, acute infarction, or significant intracranial injury. Vascular: No hyperdense vessel or unexpected calcification. Skull: No acute calvarial abnormality. Sinuses/Orbits: No acute finding. Other: None IMPRESSION: No intracranial abnormality. Electronically Signed   By: Caryn Bee  [place]over M.D.   On: 08/28/2017 12:20   Scheduled Meds: . budesonide  0.25 mg Nebulization BID  . chlorhexidine  60 mL Topical Once   And  . [START ON 08/29/2017] chlorhexidine  60 mL Topical Once  . chlorhexidine  15 mL Mouth/Throat BID  . [START ON 08/29/2017] chlorhexidine  15 mL Mouth/Throat Once  . [START ON 08/29/2017] diazepam  2 mg Oral Once  . feeding supplement (GLUCERNA SHAKE)  237 mL Oral TID WC  . ferrous fumarate-b12-vitamic C-folic acid  1 capsule Oral TID PC  . furosemide  80 mg Oral Daily  . [START ON 08/29/2017] heparin-papaverine-plasmalyte irrigation   Irrigation To OR  . insulin aspart  0-15 Units Subcutaneous TID WC  . insulin glargine  14 Units Subcutaneous Daily  . lidocaine  1 patch Transdermal Q24H  . lisinopril  5 mg Oral Daily  . [START ON 08/29/2017] magnesium sulfate  40 mEq Other To OR  . [START ON 08/29/2017] metoprolol tartrate  12.5 mg Oral Once  . metoprolol tartrate  25 mg Oral BID  . potassium chloride  20 mEq Oral BID  . [START ON 08/29/2017] potassium chloride  80 mEq Other To OR  . spironolactone  12.5 mg Oral Daily  . [START ON 08/29/2017] tranexamic acid  15 mg/kg Intravenous To OR  . [START ON 08/29/2017] tranexamic acid  2 mg/kg Intracatheter To OR   Continuous Infusions: . sodium chloride    . [START ON 08/29/2017] cefUROXime (ZINACEF)  IV    . [START ON 08/29/2017] cefUROXime (ZINACEF)  IV    . DAPTOmycin (CUBICIN)  IV Stopped (08/28/17 1806)  . [START ON  08/29/2017] dexmedetomidine    . [START ON 08/29/2017] DOPamine    . [START ON 08/29/2017] epinephrine    . [START ON 08/29/2017] heparin 30,000 units/NS 1000 mL solution for CELLSAVER    . heparin 2,700 Units/hr (08/28/17 1336)  . [START ON 08/29/2017] insulin (NOVOLIN-R) infusion    . [START ON 08/29/2017] milrinone    . [START ON 08/29/2017] nitroGLYCERIN    . [START ON 08/29/2017] phenylephrine 20mg /228mL NS (0.08mg /ml) infusion    . [START ON 08/29/2017] tranexamic acid (CYKLOKAPRON) infusion ([facility])    . [START ON 08/29/2017] vancomycin      LOS: 8 days   Merlene Laughter, DO [facility] Pager [phone]  If 7PM-7AM, please contact night-coverage [email or web address] Password [email or web address] 08/28/2017, 9:10 PM

## 2017-08-28 NOTE — Progress Notes (Signed)
Physical Therapy Treatment Patient Details Name: Reginald [name] MRN: [id number] DOB: 12/18/1960 Today's Date: 08/28/2017    History of Present Illness Reginald [name] a 57 y.o.malewith medical history significant ofosteomyelitis of the spine, alcohol abuse, type 2 diabetes, paraspinal abscess, tobacco abuse, nonobstructive CAD, mitral regurgitation whohad very complicated admissionfrom 07/25/2017 to 08/10/2017 due to mitral valve large vegetation/endocarditis, possible aortic valve endocarditis, septic emboli to the brain, multifocal pneumonia, C1 osteomyelitis, C4 discitis and paraspinal abscess due to MRSA bacteremia.Admitted back to hospital from CIR 08/20/17 with resp distress and edema.  Plan for MVR 08/29/17.     PT Comments    Pt admitted with above diagnosis. Pt currently with functional limitations due to balance and endurance deficits. Pt was able to ambulate with RW with cues.  Desat to 88% unless placed on 4LO2.  Pt >90% on 4LO2. Pt on 3LO2 at rest and sats >90%.  Will continue acute PT.     Pt will benefit from skilled PT to increase their independence and safety with mobility to allow discharge to the venue listed below.     Follow Up Recommendations  Home health PT;Supervision/Assistance - 24 hour     Equipment Recommendations  Rolling walker with 5" wheels    Recommendations for Other Services OT consult     Precautions / Restrictions Precautions Precautions: Fall Precaution Comments: debility, watch O2 sats Restrictions Weight Bearing Restrictions: No    Mobility  Bed Mobility Overal bed mobility: Needs Assistance             General bed mobility comments: up in chair on arrival  Transfers Overall transfer level: Needs assistance Equipment used: Rolling walker (2 wheeled) Transfers: Sit to/from Stand Sit to Stand: Min guard         General transfer comment: verbal cues for hand placement.  Min guard to steady.      Ambulation/Gait Ambulation/Gait assistance: Min assist;Min guard Ambulation Distance (Feet): 250 Feet Assistive device: Rolling walker (2 wheeled) Gait Pattern/deviations: Step-through pattern;Decreased step length - right;Decreased step length - left;Shuffle;Trunk flexed Gait velocity: decr Gait velocity interpretation: Below normal speed for age/gender General Gait Details: Cues for forward gaze, upper trunk control and sequencing and increasing stride length.  Pt on 4L O2 with O2 sats 90-94%.  Cues for  pursed lip breathing.     Stairs            Wheelchair Mobility    Modified Rankin (Stroke Patients Only) Modified Rankin (Stroke Patients Only) Pre-Morbid Rankin Score: No symptoms Modified Rankin: Moderately severe disability     Balance Overall balance assessment: Needs assistance Sitting-balance support: Feet supported;No upper extremity supported Sitting balance-Leahy Scale: Good     Standing balance support: Bilateral upper extremity supported;During functional activity Standing balance-Leahy Scale: Poor Standing balance comment: Pt needs RW and external support for balance                            Cognition Arousal/Alertness: Awake/alert Behavior During Therapy: WFL for tasks assessed/performed Overall Cognitive Status: Within Functional Limits for tasks assessed Area of Impairment: Attention;Safety/judgement;Following commands;Problem solving                   Current Attention Level: Selective Memory: Decreased short-term memory Following Commands: Follows one step commands consistently;Follows one step commands with increased time Safety/Judgement: Decreased awareness of safety;Decreased awareness of deficits   Problem Solving: Slow processing;Difficulty sequencing;Requires verbal cues;Requires tactile cues General Comments: Some increased time for  processing and decreased attention this session.       Exercises General  Exercises - Lower Extremity Ankle Circles/Pumps: AROM;Both;10 reps;Supine Long Arc Quad: AROM;Both;10 reps;Seated    General Comments        Pertinent Vitals/Pain Pain Assessment: No/denies pain Faces Pain Scale: No hurt    Home Living                      Prior Function            PT Goals (current goals can now be found in the care plan section) Acute Rehab PT Goals Patient Stated Goal: go home Progress towards PT goals: Progressing toward goals    Frequency    Min 3X/week      PT Plan Current plan remains appropriate    Co-evaluation              AM-PAC PT "6 Clicks" Daily Activity  Outcome Measure  Difficulty turning over in bed (including adjusting bedclothes, sheets and blankets)?: None Difficulty moving from lying on back to sitting on the side of the bed? : A Lot Difficulty sitting down on and standing up from a chair with arms (e.g., wheelchair, bedside commode, etc,.)?: A Little Help needed moving to and from a bed to chair (including a wheelchair)?: A Little Help needed walking in hospital room?: A Little Help needed climbing 3-5 steps with a railing? : A Little 6 Click Score: 18    End of Session Equipment Utilized During Treatment: Gait belt;Oxygen Activity Tolerance: Patient limited by fatigue Patient left: in chair;with call bell/phone within reach;with chair alarm set Nurse Communication: Mobility status PT Visit Diagnosis: Unsteadiness on feet (R26.81);Other abnormalities of gait and mobility (R26.89);Muscle weakness (generalized) (M62.81)     Time: [id number] PT Time Calculation (min) (ACUTE ONLY): 15 min  Charges:  $Gait Training: 8-22 mins                    G Codes:       Reginald [name],PT Acute Rehabilitation [phone] (pager)    Reginald [name] 08/28/2017, 11:51 AM

## 2017-08-29 ENCOUNTER — Inpatient Hospital Stay (HOSPITAL_COMMUNITY): Payer: Medicaid Other | Admitting: Certified Registered Nurse Anesthetist

## 2017-08-29 ENCOUNTER — Inpatient Hospital Stay (HOSPITAL_COMMUNITY): Admission: RE | Admit: 2017-08-29 | Payer: Self-pay | Source: Ambulatory Visit | Admitting: Cardiothoracic Surgery

## 2017-08-29 ENCOUNTER — Encounter (HOSPITAL_COMMUNITY): Admission: AD | Disposition: A | Discharge status: Home Health | Payer: Self-pay | Attending: Cardiothoracic Surgery

## 2017-08-29 ENCOUNTER — Inpatient Hospital Stay (HOSPITAL_COMMUNITY): Payer: Medicaid Other

## 2017-08-29 DIAGNOSIS — I341 Nonrheumatic mitral (valve) prolapse: Secondary | ICD-10-CM

## 2017-08-29 DIAGNOSIS — I10 Essential (primary) hypertension: Secondary | ICD-10-CM

## 2017-08-29 DIAGNOSIS — E1142 Type 2 diabetes mellitus with diabetic polyneuropathy: Secondary | ICD-10-CM

## 2017-08-29 DIAGNOSIS — J9601 Acute respiratory failure with hypoxia: Secondary | ICD-10-CM

## 2017-08-29 DIAGNOSIS — I669 Occlusion and stenosis of unspecified cerebral artery: Secondary | ICD-10-CM

## 2017-08-29 DIAGNOSIS — E871 Hypo-osmolality and hyponatremia: Secondary | ICD-10-CM

## 2017-08-29 DIAGNOSIS — Z952 Presence of prosthetic heart valve: Secondary | ICD-10-CM

## 2017-08-29 DIAGNOSIS — R7881 Bacteremia: Secondary | ICD-10-CM

## 2017-08-29 DIAGNOSIS — I76 Septic arterial embolism: Secondary | ICD-10-CM

## 2017-08-29 HISTORY — PX: MITRAL VALVE REPLACEMENT: SHX147

## 2017-08-29 HISTORY — PX: TEE WITHOUT CARDIOVERSION: SHX5443

## 2017-08-29 LAB — HEMOGLOBIN AND HEMATOCRIT, BLOOD
HCT: 23.1 % — ABNORMAL LOW (ref 39.0–52.0)
Hemoglobin: 7.3 g/dL — ABNORMAL LOW (ref 13.0–17.0)

## 2017-08-29 LAB — COMPREHENSIVE METABOLIC PANEL
ALK PHOS: 198 U/L — AB (ref 38–126)
ALT: 21 U/L (ref 17–63)
ANION GAP: 8 (ref 5–15)
AST: 19 U/L (ref 15–41)
Albumin: 2.5 g/dL — ABNORMAL LOW (ref 3.5–5.0)
BUN: 18 mg/dL (ref 6–20)
CALCIUM: 8.8 mg/dL — AB (ref 8.9–10.3)
CO2: 28 mmol/L (ref 22–32)
Chloride: 91 mmol/L — ABNORMAL LOW (ref 101–111)
Creatinine, Ser: 0.9 mg/dL (ref 0.61–1.24)
GFR calc Af Amer: >60 mL/min (ref >60)
Glucose, Bld: 137 mg/dL — ABNORMAL HIGH (ref 65–99)
POTASSIUM: 5.2 mmol/L — AB (ref 3.5–5.1)
Sodium: 127 mmol/L — ABNORMAL LOW (ref 135–145)
TOTAL PROTEIN: 8 g/dL (ref 6.5–8.1)
Total Bilirubin: 0.9 mg/dL (ref 0.3–1.2)

## 2017-08-29 LAB — CBC WITH DIFFERENTIAL/PLATELET
Basophils Absolute: 0.1 10*3/uL (ref 0.0–0.1)
Basophils Relative: 1 %
Eosinophils Absolute: 0.4 10*3/uL (ref 0.0–0.7)
Eosinophils Relative: 3 %
HEMATOCRIT: 27.3 % — AB (ref 39.0–52.0)
HEMOGLOBIN: 8.4 g/dL — AB (ref 13.0–17.0)
LYMPHS ABS: 2.9 10*3/uL (ref 0.7–4.0)
LYMPHS PCT: 27 %
MCH: 27.4 pg (ref 26.0–34.0)
MCHC: 30.8 g/dL (ref 30.0–36.0)
MCV: 88.9 fL (ref 78.0–100.0)
MONO ABS: 0.5 10*3/uL (ref 0.1–1.0)
MONOS PCT: 5 %
NEUTROS ABS: 6.8 10*3/uL (ref 1.7–7.7)
NEUTROS PCT: 64 %
Platelets: 332 10*3/uL (ref 150–400)
RBC: 3.07 MIL/uL — ABNORMAL LOW (ref 4.22–5.81)
RDW: 17 % — AB (ref 11.5–15.5)
WBC: 10.7 10*3/uL — ABNORMAL HIGH (ref 4.0–10.5)

## 2017-08-29 LAB — GLUCOSE, CAPILLARY
Glucose-Capillary: 122 mg/dL — ABNORMAL HIGH (ref 65–99)
Glucose-Capillary: 123 mg/dL — ABNORMAL HIGH (ref 65–99)
Glucose-Capillary: 123 mg/dL — ABNORMAL HIGH (ref 65–99)
Glucose-Capillary: 127 mg/dL — ABNORMAL HIGH (ref 65–99)
Glucose-Capillary: 132 mg/dL — ABNORMAL HIGH (ref 65–99)
Glucose-Capillary: 134 mg/dL — ABNORMAL HIGH (ref 65–99)
Glucose-Capillary: 136 mg/dL — ABNORMAL HIGH (ref 65–99)
Glucose-Capillary: 138 mg/dL — ABNORMAL HIGH (ref 65–99)
Glucose-Capillary: 142 mg/dL — ABNORMAL HIGH (ref 65–99)
Glucose-Capillary: 142 mg/dL — ABNORMAL HIGH (ref 65–99)

## 2017-08-29 LAB — CBC
HCT: 27.2 % — ABNORMAL LOW (ref 39.0–52.0)
HCT: 24.7 % — ABNORMAL LOW (ref 39.0–52.0)
HEMOGLOBIN: 8.3 g/dL — AB (ref 13.0–17.0)
Hemoglobin: 7.6 g/dL — ABNORMAL LOW (ref 13.0–17.0)
MCH: 26.7 pg (ref 26.0–34.0)
MCH: 26.6 pg (ref 26.0–34.0)
MCHC: 30.5 g/dL (ref 30.0–36.0)
MCHC: 30.8 g/dL (ref 30.0–36.0)
MCV: 87.5 fL (ref 78.0–100.0)
MCV: 86.4 fL (ref 78.0–100.0)
PLATELETS: 203 10*3/uL (ref 150–400)
Platelets: 191 10*3/uL (ref 150–400)
RBC: 3.11 MIL/uL — AB (ref 4.22–5.81)
RBC: 2.86 MIL/uL — ABNORMAL LOW (ref 4.22–5.81)
RDW: 16.7 % — ABNORMAL HIGH (ref 11.5–15.5)
RDW: 16.4 % — ABNORMAL HIGH (ref 11.5–15.5)
WBC: 13.5 10*3/uL — AB (ref 4.0–10.5)
WBC: 11.5 10*3/uL — ABNORMAL HIGH (ref 4.0–10.5)

## 2017-08-29 LAB — POCT I-STAT 3, ART BLOOD GAS (G3+)
Acid-Base Excess: 1 mmol/L (ref 0.0–2.0)
Bicarbonate: 25.5 mmol/L (ref 20.0–28.0)
Bicarbonate: 24.9 mmol/L (ref 20.0–28.0)
O2 Saturation: 98 %
O2 Saturation: 99 %
Patient temperature: 36.8
Patient temperature: 37.2
TCO2: 27 mmol/L (ref 22–32)
TCO2: 26 mmol/L (ref 22–32)
pCO2 arterial: 39.1 mmHg (ref 32.0–48.0)
pCO2 arterial: 39.3 mmHg (ref 32.0–48.0)
pH, Arterial: 7.422 (ref 7.350–7.450)
pH, Arterial: 7.411 (ref 7.350–7.450)
pO2, Arterial: 96 mmHg (ref 83.0–108.0)
pO2, Arterial: 132 mmHg — ABNORMAL HIGH (ref 83.0–108.0)

## 2017-08-29 LAB — BLOOD GAS, ARTERIAL
Acid-Base Excess: 5.5 mmol/L — ABNORMAL HIGH (ref 0.0–2.0)
Bicarbonate: 30.3 mmol/L — ABNORMAL HIGH (ref 20.0–28.0)
Drawn by: 34762
O2 Content: 4 L/min
O2 Saturation: 95.4 %
Patient temperature: 98.7
pCO2 arterial: 51.4 mmHg — ABNORMAL HIGH (ref 32.0–48.0)
pH, Arterial: 7.388 (ref 7.350–7.450)
pO2, Arterial: 78.3 mmHg — ABNORMAL LOW (ref 83.0–108.0)

## 2017-08-29 LAB — MAGNESIUM
MAGNESIUM: 2 mg/dL (ref 1.7–2.4)
Magnesium: 2.5 mg/dL — ABNORMAL HIGH (ref 1.7–2.4)

## 2017-08-29 LAB — PROTIME-INR
INR: 1.5
PROTHROMBIN TIME: 17.9 s — AB (ref 11.4–15.2)

## 2017-08-29 LAB — POCT I-STAT, CHEM 8
BUN: 16 mg/dL (ref 6–20)
Calcium, Ion: 1.19 mmol/L (ref 1.15–1.40)
Chloride: 96 mmol/L — ABNORMAL LOW (ref 101–111)
Creatinine, Ser: 0.8 mg/dL (ref 0.61–1.24)
Glucose, Bld: 129 mg/dL — ABNORMAL HIGH (ref 65–99)
HCT: 24 % — ABNORMAL LOW (ref 39.0–52.0)
Hemoglobin: 8.2 g/dL — ABNORMAL LOW (ref 13.0–17.0)
Potassium: 4.8 mmol/L (ref 3.5–5.1)
Sodium: 134 mmol/L — ABNORMAL LOW (ref 135–145)
TCO2: 25 mmol/L (ref 22–32)

## 2017-08-29 LAB — PHOSPHORUS: Phosphorus: 4.9 mg/dL — ABNORMAL HIGH (ref 2.5–4.6)

## 2017-08-29 LAB — PLATELET COUNT: Platelets: 231 10*3/uL (ref 150–400)

## 2017-08-29 LAB — ECHO TEE
AO mean calculated velocity dopler: 111 cm/s
AV Mean grad: 9 mmHg
AV Peak grad: 29 mmHg
AV pk vel: 270 cm/s
LVOT area: 2.01 cm2
LVOT diameter: 16 mm
MV Annulus VTI: 39.3 cm
MV M vel: 76.2
Mean grad: 3 mmHg
VTI: 43 cm

## 2017-08-29 LAB — SURGICAL PCR SCREEN
MRSA, PCR: NEGATIVE
Staphylococcus aureus: NEGATIVE

## 2017-08-29 LAB — CREATININE, SERUM
Creatinine, Ser: 0.93 mg/dL (ref 0.61–1.24)
GFR calc Af Amer: >60 mL/min (ref >60)
GFR calc non Af Amer: >60 mL/min (ref >60)

## 2017-08-29 LAB — APTT: APTT: 38 s — AB (ref 24–36)

## 2017-08-29 SURGERY — REPLACEMENT, MITRAL VALVE
Anesthesia: General

## 2017-08-29 MED ORDER — POTASSIUM CHLORIDE 10 MEQ/50ML IV SOLN: 10.0000 meq | INTRAVENOUS | Status: AC

## 2017-08-29 MED ORDER — METOPROLOL TARTRATE 12.5 MG HALF TABLET: 12.5000 mg | ORAL_TABLET | Freq: Two times a day (BID) | ORAL | Status: DC

## 2017-08-29 MED ORDER — ACETAMINOPHEN 160 MG/5ML PO SOLN: 650.0000 mg | Freq: Once | ORAL | Status: AC

## 2017-08-29 MED ORDER — MAGNESIUM SULFATE 4 GM/100ML IV SOLN
4.0000 g | Freq: Once | INTRAVENOUS | Status: AC
  Administered 2017-08-29: 4 g via INTRAVENOUS
  Filled 2017-08-29: qty 100

## 2017-08-29 MED ORDER — LACTATED RINGERS IV SOLN: 500.0000 mL | Freq: Once | INTRAVENOUS | Status: DC | PRN

## 2017-08-29 MED ORDER — OXYCODONE HCL 5 MG PO TABS
5.0000 mg | ORAL_TABLET | ORAL | Status: DC | PRN
  Administered 2017-08-30 (×2): 10 mg via ORAL
  Administered 2017-08-30: 5 mg via ORAL
  Administered 2017-08-30: 10 mg via ORAL
  Administered 2017-08-31: 5 mg via ORAL
  Administered 2017-08-31: 10 mg via ORAL
  Administered 2017-08-31: 5 mg via ORAL
  Administered 2017-08-31: 10 mg via ORAL
  Filled 2017-08-29 (×3): qty 2
  Filled 2017-08-29 (×3): qty 1
  Filled 2017-08-29 (×2): qty 2
  Filled 2017-08-29: qty 1

## 2017-08-29 MED ORDER — ACETAMINOPHEN 160 MG/5ML PO SOLN
1000.0000 mg | Freq: Four times a day (QID) | ORAL | Status: AC
  Administered 2017-08-30 – 2017-08-31 (×3): 1000 mg
  Filled 2017-08-29 (×4): qty 40.6

## 2017-08-29 MED ORDER — DEXTROSE 5 % IV SOLN
1.5000 g | Freq: Two times a day (BID) | INTRAVENOUS | Status: AC
  Administered 2017-08-29 – 2017-08-31 (×4): 1.5 g via INTRAVENOUS
  Filled 2017-08-29 (×4): qty 1.5

## 2017-08-29 MED ORDER — BISACODYL 10 MG RE SUPP: 10.0000 mg | Freq: Every day | RECTAL | Status: DC

## 2017-08-29 MED ORDER — EPINEPHRINE PF 1 MG/ML IJ SOLN: 2.0000 ug/min | INTRAVENOUS | Status: DC

## 2017-08-29 MED ORDER — LACTATED RINGERS IV SOLN: INTRAVENOUS | Status: DC

## 2017-08-29 MED ORDER — VANCOMYCIN HCL IN DEXTROSE 1-5 GM/200ML-% IV SOLN
1000.0000 mg | Freq: Once | INTRAVENOUS | Status: AC
  Administered 2017-08-29: 1000 mg via INTRAVENOUS
  Filled 2017-08-29: qty 200

## 2017-08-29 MED ORDER — MORPHINE SULFATE (PF) 4 MG/ML IV SOLN: 2.0000 mg | INTRAVENOUS | Status: DC | PRN

## 2017-08-29 MED ORDER — CHLORHEXIDINE GLUCONATE 0.12% ORAL RINSE (MEDLINE KIT)
15.0000 mL | Freq: Two times a day (BID) | OROMUCOSAL | Status: DC
  Administered 2017-08-29 – 2017-08-30 (×2): 15 mL via OROMUCOSAL

## 2017-08-29 MED ORDER — PROTAMINE SULFATE 10 MG/ML IV SOLN
INTRAVENOUS | Status: DC | PRN
  Administered 2017-08-29: 370 mg via INTRAVENOUS

## 2017-08-29 MED ORDER — MIDAZOLAM HCL 5 MG/5ML IJ SOLN
INTRAMUSCULAR | Status: DC | PRN
  Administered 2017-08-29 (×2): 2 mg via INTRAVENOUS
  Administered 2017-08-29: 4 mg via INTRAVENOUS
  Administered 2017-08-29: 2 mg via INTRAVENOUS

## 2017-08-29 MED ORDER — BISACODYL 5 MG PO TBEC
10.0000 mg | DELAYED_RELEASE_TABLET | Freq: Every day | ORAL | Status: DC
  Administered 2017-08-30 – 2017-09-01 (×3): 10 mg via ORAL
  Filled 2017-08-29 (×3): qty 2

## 2017-08-29 MED ORDER — FENTANYL CITRATE (PF) 100 MCG/2ML IJ SOLN
INTRAMUSCULAR | Status: DC | PRN
  Administered 2017-08-29: 100 ug via INTRAVENOUS
  Administered 2017-08-29 (×2): 50 ug via INTRAVENOUS
  Administered 2017-08-29: 100 ug via INTRAVENOUS
  Administered 2017-08-29: 150 ug via INTRAVENOUS

## 2017-08-29 MED ORDER — SODIUM CHLORIDE 0.45 % IV SOLN
INTRAVENOUS | Status: DC | PRN
  Administered 2017-08-29: 14:00:00 via INTRAVENOUS

## 2017-08-29 MED ORDER — INSULIN REGULAR BOLUS VIA INFUSION
0.0000 [IU] | Freq: Three times a day (TID) | INTRAVENOUS | Status: DC
  Filled 2017-08-29: qty 10

## 2017-08-29 MED ORDER — DEXMEDETOMIDINE HCL IN NACL 200 MCG/50ML IV SOLN
INTRAVENOUS | Status: DC | PRN
  Administered 2017-08-29: 13:00:00 via INTRAVENOUS
  Administered 2017-08-29: .3 ug/kg/h via INTRAVENOUS

## 2017-08-29 MED ORDER — ONDANSETRON HCL 4 MG/2ML IJ SOLN: 4.0000 mg | Freq: Four times a day (QID) | INTRAMUSCULAR | Status: DC | PRN

## 2017-08-29 MED ORDER — LACTATED RINGERS IV SOLN
INTRAVENOUS | Status: DC
  Administered 2017-08-30: 15:00:00 via INTRAVENOUS

## 2017-08-29 MED ORDER — LEVALBUTEROL HCL 1.25 MG/0.5ML IN NEBU
1.2500 mg | INHALATION_SOLUTION | Freq: Four times a day (QID) | RESPIRATORY_TRACT | Status: DC
  Administered 2017-08-29 – 2017-08-31 (×7): 1.25 mg via RESPIRATORY_TRACT
  Filled 2017-08-29 (×7): qty 0.5

## 2017-08-29 MED ORDER — NOREPINEPHRINE BITARTRATE 1 MG/ML IV SOLN
INTRAVENOUS | Status: DC | PRN
  Administered 2017-08-29: 5 ug/min via INTRAVENOUS

## 2017-08-29 MED ORDER — SODIUM CHLORIDE 0.9 % IV SOLN
INTRAVENOUS | Status: DC
  Administered 2017-09-01: 18:00:00 via INTRAVENOUS

## 2017-08-29 MED ORDER — EPINEPHRINE PF 1 MG/ML IJ SOLN
INTRAVENOUS | Status: DC | PRN
  Administered 2017-08-29: 4 ug/min via INTRAVENOUS

## 2017-08-29 MED ORDER — TRAMADOL HCL 50 MG PO TABS
50.0000 mg | ORAL_TABLET | ORAL | Status: DC | PRN
  Administered 2017-09-03: 50 mg via ORAL
  Administered 2017-09-05: 100 mg via ORAL
  Filled 2017-08-29: qty 2
  Filled 2017-08-29: qty 1

## 2017-08-29 MED ORDER — ORAL CARE MOUTH RINSE
15.0000 mL | OROMUCOSAL | Status: DC
Start: 2017-08-29 — End: 2017-08-30
  Administered 2017-08-29 – 2017-08-30 (×5): 15 mL via OROMUCOSAL

## 2017-08-29 MED ORDER — SODIUM CHLORIDE 0.9% FLUSH: 3.0000 mL | INTRAVENOUS | Status: DC | PRN

## 2017-08-29 MED ORDER — FAMOTIDINE IN NACL 20-0.9 MG/50ML-% IV SOLN
20.0000 mg | Freq: Two times a day (BID) | INTRAVENOUS | Status: AC
  Administered 2017-08-29 (×2): 20 mg via INTRAVENOUS
  Filled 2017-08-29: qty 50

## 2017-08-29 MED ORDER — LACTATED RINGERS IV SOLN
INTRAVENOUS | Status: DC | PRN
  Administered 2017-08-29: 07:00:00 via INTRAVENOUS

## 2017-08-29 MED ORDER — LACTATED RINGERS IV SOLN
INTRAVENOUS | Status: DC | PRN
  Administered 2017-08-29: 08:00:00 via INTRAVENOUS

## 2017-08-29 MED ORDER — SODIUM CHLORIDE 0.9 % IV SOLN
0.0000 ug/kg/h | INTRAVENOUS | Status: DC
  Administered 2017-08-29: 0.5 ug/kg/h via INTRAVENOUS
  Administered 2017-08-29: 0.4 ug/kg/h via INTRAVENOUS
  Filled 2017-08-29 (×2): qty 2

## 2017-08-29 MED ORDER — HEMOSTATIC AGENTS (NO CHARGE) OPTIME: TOPICAL | Status: DC | PRN

## 2017-08-29 MED ORDER — CALCIUM CHLORIDE 10 % IV SOLN
INTRAVENOUS | Status: DC | PRN
  Administered 2017-08-29: 200 mg via INTRAVENOUS

## 2017-08-29 MED ORDER — SODIUM CHLORIDE 0.9 % IV SOLN: 250.0000 mL | INTRAVENOUS | Status: DC

## 2017-08-29 MED ORDER — NITROGLYCERIN IN D5W 200-5 MCG/ML-% IV SOLN: 0.0000 ug/min | INTRAVENOUS | Status: DC

## 2017-08-29 MED ORDER — 0.9 % SODIUM CHLORIDE (POUR BTL) OPTIME
TOPICAL | Status: DC | PRN
  Administered 2017-08-29: 5000 mL

## 2017-08-29 MED ORDER — VASOPRESSIN 20 UNIT/ML IV SOLN
0.0300 [IU]/min | INTRAVENOUS | Status: DC
  Filled 2017-08-29: qty 2

## 2017-08-29 MED ORDER — ASPIRIN EC 325 MG PO TBEC
325.0000 mg | DELAYED_RELEASE_TABLET | Freq: Every day | ORAL | Status: DC
  Administered 2017-08-30 – 2017-09-01 (×3): 325 mg via ORAL
  Filled 2017-08-29 (×3): qty 1

## 2017-08-29 MED ORDER — NOREPINEPHRINE BITARTRATE 1 MG/ML IV SOLN
0.0000 ug/min | INTRAVENOUS | Status: DC
  Filled 2017-08-29: qty 4

## 2017-08-29 MED ORDER — HEMOSTATIC AGENTS (NO CHARGE) OPTIME
TOPICAL | Status: DC | PRN
  Administered 2017-08-29: 1 via TOPICAL

## 2017-08-29 MED ORDER — HEPARIN SODIUM (PORCINE) 1000 UNIT/ML IJ SOLN
INTRAMUSCULAR | Status: DC | PRN
  Administered 2017-08-29: 37000 [IU] via INTRAVENOUS

## 2017-08-29 MED ORDER — PHENYLEPHRINE HCL 10 MG/ML IJ SOLN
INTRAVENOUS | Status: DC | PRN
  Administered 2017-08-29: 25 ug/min via INTRAVENOUS

## 2017-08-29 MED ORDER — MOMETASONE FURO-FORMOTEROL FUM 200-5 MCG/ACT IN AERO
2.0000 | INHALATION_SPRAY | Freq: Two times a day (BID) | RESPIRATORY_TRACT | Status: DC
  Administered 2017-08-31 – 2017-09-05 (×10): 2 via RESPIRATORY_TRACT
  Filled 2017-08-29 (×2): qty 8.8

## 2017-08-29 MED ORDER — MORPHINE SULFATE (PF) 4 MG/ML IV SOLN: 1.0000 mg | INTRAVENOUS | Status: DC | PRN

## 2017-08-29 MED ORDER — MILRINONE LACTATE IN DEXTROSE 20-5 MG/100ML-% IV SOLN
0.1250 ug/kg/min | INTRAVENOUS | Status: AC
  Administered 2017-08-29: .25 ug/kg/min via INTRAVENOUS
  Filled 2017-08-29: qty 100

## 2017-08-29 MED ORDER — PROPOFOL 10 MG/ML IV BOLUS
INTRAVENOUS | Status: DC | PRN
  Administered 2017-08-29: 60 mg via INTRAVENOUS

## 2017-08-29 MED ORDER — DOCUSATE SODIUM 100 MG PO CAPS
200.0000 mg | ORAL_CAPSULE | Freq: Every day | ORAL | Status: DC
  Administered 2017-08-30 – 2017-09-02 (×4): 200 mg via ORAL
  Filled 2017-08-29 (×4): qty 2

## 2017-08-29 MED ORDER — SODIUM CHLORIDE 0.9 % IV SOLN
INTRAVENOUS | Status: DC
  Administered 2017-08-29: 3.2 [IU]/h via INTRAVENOUS
  Administered 2017-08-29: 5.5 [IU]/h via INTRAVENOUS
  Filled 2017-08-29: qty 1

## 2017-08-29 MED ORDER — SODIUM CHLORIDE 0.9% FLUSH
3.0000 mL | Freq: Two times a day (BID) | INTRAVENOUS | Status: DC
  Administered 2017-08-30 (×2): 3 mL via INTRAVENOUS

## 2017-08-29 MED ORDER — SODIUM CHLORIDE 0.9 % IV SOLN
INTRAVENOUS | Status: DC | PRN
  Administered 2017-08-29: 1.6 [IU]/h via INTRAVENOUS

## 2017-08-29 MED ORDER — PLASMA-LYTE 148 IV SOLN
INTRAVENOUS | Status: DC | PRN
  Administered 2017-08-29: 500 mL via INTRAVASCULAR

## 2017-08-29 MED ORDER — METOPROLOL TARTRATE 5 MG/5ML IV SOLN
2.5000 mg | INTRAVENOUS | Status: DC | PRN
  Administered 2017-08-31: 5 mg via INTRAVENOUS
  Filled 2017-08-29: qty 5

## 2017-08-29 MED ORDER — METOPROLOL TARTRATE 25 MG/10 ML ORAL SUSPENSION: 12.5000 mg | Freq: Two times a day (BID) | ORAL | Status: DC

## 2017-08-29 MED ORDER — ACETAMINOPHEN 500 MG PO TABS
1000.0000 mg | ORAL_TABLET | Freq: Four times a day (QID) | ORAL | Status: AC
  Administered 2017-08-30 – 2017-09-03 (×13): 1000 mg via ORAL
  Filled 2017-08-29 (×13): qty 2

## 2017-08-29 MED ORDER — FENTANYL CITRATE (PF) 100 MCG/2ML IJ SOLN
50.0000 ug | INTRAMUSCULAR | Status: DC | PRN
  Administered 2017-08-29 – 2017-08-30 (×5): 50 ug via INTRAVENOUS
  Filled 2017-08-29 (×6): qty 2

## 2017-08-29 MED ORDER — SODIUM CHLORIDE 0.9 % IV SOLN
0.0000 ug/min | INTRAVENOUS | Status: DC
  Administered 2017-08-29: 60 ug/min via INTRAVENOUS
  Administered 2017-08-29: 75 ug/min via INTRAVENOUS
  Administered 2017-08-30: 40 ug/min via INTRAVENOUS
  Filled 2017-08-29 (×2): qty 2
  Filled 2017-08-29: qty 20

## 2017-08-29 MED ORDER — PHENYLEPHRINE 40 MCG/ML (10ML) SYRINGE FOR IV PUSH (FOR BLOOD PRESSURE SUPPORT)
PREFILLED_SYRINGE | INTRAVENOUS | Status: DC | PRN
  Administered 2017-08-29 (×3): 40 ug via INTRAVENOUS

## 2017-08-29 MED ORDER — TRANEXAMIC ACID 1000 MG/10ML IV SOLN
1.5000 mg/kg/h | INTRAVENOUS | Status: DC
  Filled 2017-08-29: qty 10

## 2017-08-29 MED ORDER — ASPIRIN 81 MG PO CHEW: 324.0000 mg | CHEWABLE_TABLET | Freq: Every day | ORAL | Status: DC

## 2017-08-29 MED ORDER — MILRINONE LACTATE IN DEXTROSE 20-5 MG/100ML-% IV SOLN
0.1250 ug/kg/min | INTRAVENOUS | Status: DC
  Administered 2017-08-29: 0.25 ug/kg/min via INTRAVENOUS
  Administered 2017-08-30: 0.125 ug/kg/min via INTRAVENOUS
  Filled 2017-08-29 (×3): qty 100

## 2017-08-29 MED ORDER — ACETAMINOPHEN 650 MG RE SUPP
650.0000 mg | Freq: Once | RECTAL | Status: AC
  Administered 2017-08-29: 650 mg via RECTAL

## 2017-08-29 MED ORDER — MIDAZOLAM HCL 2 MG/2ML IJ SOLN
2.0000 mg | INTRAMUSCULAR | Status: AC | PRN
  Administered 2017-08-29 – 2017-08-30 (×2): 1 mg via INTRAVENOUS
  Filled 2017-08-29: qty 2

## 2017-08-29 MED ORDER — ALBUMIN HUMAN 5 % IV SOLN
250.0000 mL | INTRAVENOUS | Status: AC | PRN
  Administered 2017-08-29 (×4): 250 mL via INTRAVENOUS
  Filled 2017-08-29 (×2): qty 250

## 2017-08-29 MED ORDER — METOCLOPRAMIDE HCL 5 MG/ML IJ SOLN
10.0000 mg | Freq: Four times a day (QID) | INTRAMUSCULAR | Status: DC
Start: 2017-08-29 — End: 2017-09-01
  Administered 2017-08-29 – 2017-09-01 (×11): 10 mg via INTRAVENOUS
  Filled 2017-08-29 (×10): qty 2

## 2017-08-29 MED ORDER — ROCURONIUM BROMIDE 10 MG/ML (PF) SYRINGE
PREFILLED_SYRINGE | INTRAVENOUS | Status: DC | PRN
  Administered 2017-08-29 (×6): 50 mg via INTRAVENOUS

## 2017-08-29 MED ORDER — CHLORHEXIDINE GLUCONATE 0.12 % MT SOLN
15.0000 mL | OROMUCOSAL | Status: AC
  Administered 2017-08-29: 15 mL via OROMUCOSAL

## 2017-08-29 MED ORDER — PANTOPRAZOLE SODIUM 40 MG PO TBEC
40.0000 mg | DELAYED_RELEASE_TABLET | Freq: Every day | ORAL | Status: DC
  Administered 2017-08-31 – 2017-09-06 (×7): 40 mg via ORAL
  Filled 2017-08-29 (×7): qty 1

## 2017-08-29 MED ORDER — DEXTROSE 5 % IV SOLN
INTRAVENOUS | Status: DC | PRN
  Administered 2017-08-29: 750 mg via INTRAVENOUS

## 2017-08-29 SURGICAL SUPPLY — 112 items
ADAPTER CARDIO PERF ANTE/RETRO (ADAPTER) ×3 IMPLANT
APPLICATOR COTTON TIP 6IN STRL (MISCELLANEOUS) ×3 IMPLANT
ATTRACTOMAT 16X20 MAGNETIC DRP (DRAPES) ×3 IMPLANT
BAG DECANTER FOR FLEXI CONT (MISCELLANEOUS) ×3 IMPLANT
BLADE 15 SAFETY STRL DISP (BLADE) ×3 IMPLANT
BLADE STERNUM SYSTEM 6 (BLADE) ×3 IMPLANT
BLADE SURG 15 STRL LF DISP TIS (BLADE) ×1 IMPLANT
BLADE SURG 15 STRL SS (BLADE) ×2
CANISTER SUCT 3000ML PPV (MISCELLANEOUS) ×3 IMPLANT
CANN PRFSN 3/8XRT ANG TPR 14 (MISCELLANEOUS) ×1
CANNULA AORTIC ROOT 20012 (MISCELLANEOUS) ×3 IMPLANT
CANNULA GUNDRY RCSP 15FR (MISCELLANEOUS) ×3 IMPLANT
CANNULA PRFSN 3/8XRT ANG TPR14 (MISCELLANEOUS) ×1 IMPLANT
CANNULA SUMP PERICARDIAL (CANNULA) ×6 IMPLANT
CANNULA VEN MTL TIP RT (MISCELLANEOUS) ×2
CANNULA VESSEL 3MM 2 BLNT TIP (CANNULA) ×3 IMPLANT
CANNULA VRC MALB SNGL STG 28FR (MISCELLANEOUS) ×1 IMPLANT
CATH RETROPLEGIA CORONARY 14FR (CATHETERS) ×3 IMPLANT
CATH ROBINSON RED A/P 18FR (CATHETERS) ×15 IMPLANT
CATH THORACIC 36FR (CATHETERS) IMPLANT
CATH THORACIC 36FR RT ANG (CATHETERS) ×6 IMPLANT
CLIP FOGARTY SPRING 6M (CLIP) ×3 IMPLANT
CONN 1/2X1/2X1/2  BEN (MISCELLANEOUS) ×2
CONN 1/2X1/2X1/2 BEN (MISCELLANEOUS) ×1 IMPLANT
CONN 3/8X1/2 ST GISH (MISCELLANEOUS) ×6 IMPLANT
CONT SPEC 4OZ CLIKSEAL STRL BL (MISCELLANEOUS) ×6 IMPLANT
COVER SURGICAL LIGHT HANDLE (MISCELLANEOUS) IMPLANT
CRADLE DONUT ADULT HEAD (MISCELLANEOUS) ×3 IMPLANT
DRAPE CARDIOVASCULAR INCISE (DRAPES) ×2
DRAPE SLUSH/WARMER DISC (DRAPES) ×3 IMPLANT
DRAPE SRG 135X102X78XABS (DRAPES) ×1 IMPLANT
DRSG AQUACEL AG ADV 3.5X14 (GAUZE/BANDAGES/DRESSINGS) ×3 IMPLANT
ELECT CAUTERY BLADE 6.4 (BLADE) ×3 IMPLANT
ELECT REM PT RETURN 9FT ADLT (ELECTROSURGICAL) ×6
ELECTRODE REM PT RTRN 9FT ADLT (ELECTROSURGICAL) ×2 IMPLANT
FELT TEFLON 1X6 (MISCELLANEOUS) ×6 IMPLANT
FLOSEAL 10ML (HEMOSTASIS) ×3 IMPLANT
GAUZE SPONGE 4X4 12PLY STRL (GAUZE/BANDAGES/DRESSINGS) ×6 IMPLANT
GAUZE SPONGE 4X4 12PLY STRL LF (GAUZE/BANDAGES/DRESSINGS) ×3 IMPLANT
GLOVE BIO SURGEON ST LM GN SZ9 (GLOVE) ×12 IMPLANT
GLOVE BIO SURGEON STRL SZ 6 (GLOVE) ×3 IMPLANT
GLOVE BIO SURGEON STRL SZ 6.5 (GLOVE) ×2 IMPLANT
GLOVE BIO SURGEON STRL SZ7 (GLOVE) ×3 IMPLANT
GLOVE BIO SURGEON STRL SZ7.5 (GLOVE) ×6 IMPLANT
GLOVE BIO SURGEONS STRL SZ 6.5 (GLOVE) ×1
GLOVE BIOGEL PI IND STRL 6.5 (GLOVE) ×4 IMPLANT
GLOVE BIOGEL PI INDICATOR 6.5 (GLOVE) ×8
GLOVE EUDERMIC 7 POWDERFREE (GLOVE) ×6 IMPLANT
GOWN STRL REUS W/ TWL LRG LVL3 (GOWN DISPOSABLE) ×4 IMPLANT
GOWN STRL REUS W/ TWL XL LVL3 (GOWN DISPOSABLE) ×1 IMPLANT
GOWN STRL REUS W/TWL 2XL LVL3 (GOWN DISPOSABLE) ×9 IMPLANT
GOWN STRL REUS W/TWL LRG LVL3 (GOWN DISPOSABLE) ×8
GOWN STRL REUS W/TWL XL LVL3 (GOWN DISPOSABLE) ×2
HEMOSTAT POWDER SURGIFOAM 1G (HEMOSTASIS) ×9 IMPLANT
HEMOSTAT SURGICEL 2X14 (HEMOSTASIS) ×3 IMPLANT
INSERT FOGARTY XLG (MISCELLANEOUS) ×3 IMPLANT
KIT BASIN OR (CUSTOM PROCEDURE TRAY) ×3 IMPLANT
KIT CATH CPB BARTLE (MISCELLANEOUS) ×3 IMPLANT
KIT ROOM TURNOVER OR (KITS) ×3 IMPLANT
KIT SUCTION CATH 14FR (SUCTIONS) ×3 IMPLANT
LINE VENT (MISCELLANEOUS) ×3 IMPLANT
LOOP VESSEL SUPERMAXI WHITE (MISCELLANEOUS) ×3 IMPLANT
NS IRRIG 1000ML POUR BTL (IV SOLUTION) ×15 IMPLANT
PACK OPEN HEART (CUSTOM PROCEDURE TRAY) ×3 IMPLANT
PAD ARMBOARD 7.5X6 YLW CONV (MISCELLANEOUS) ×6 IMPLANT
SET CARDIOPLEGIA MPS 5001102 (MISCELLANEOUS) ×3 IMPLANT
SUCKER WEIGHTED FLEX (MISCELLANEOUS) ×3 IMPLANT
SUT BONE WAX W31G (SUTURE) ×3 IMPLANT
SUT ETHIBON 2 0 V 52N 30 (SUTURE) ×6 IMPLANT
SUT ETHIBOND 2 0 SH (SUTURE) ×4 IMPLANT
SUT ETHIBOND 2 0 SH 36X2 (SUTURE) ×2 IMPLANT
SUT ETHIBOND 2 0 V4 (SUTURE) IMPLANT
SUT ETHIBOND 2 0V4 GREEN (SUTURE) IMPLANT
SUT ETHIBOND 4 0 TF (SUTURE) ×3 IMPLANT
SUT ETHIBOND 5 0 C 1 30 (SUTURE) ×3 IMPLANT
SUT PROLENE 2 0 SH DA (SUTURE) ×3 IMPLANT
SUT PROLENE 3 0 SH 1 (SUTURE) ×3 IMPLANT
SUT PROLENE 3 0 SH DA (SUTURE) ×3 IMPLANT
SUT PROLENE 3 0 SH1 36 (SUTURE) ×9 IMPLANT
SUT PROLENE 4 0 RB 1 (SUTURE) ×4
SUT PROLENE 4 0 SH DA (SUTURE) ×12 IMPLANT
SUT PROLENE 4-0 RB1 .5 CRCL 36 (SUTURE) ×2 IMPLANT
SUT PROLENE 5 0 C 1 36 (SUTURE) ×3 IMPLANT
SUT PROLENE 6 0 C 1 30 (SUTURE) ×9 IMPLANT
SUT SILK  1 MH (SUTURE) ×2
SUT SILK 1 MH (SUTURE) ×1 IMPLANT
SUT SILK 1 TIES 10X30 (SUTURE) ×3 IMPLANT
SUT SILK 2 0 (SUTURE) ×2
SUT SILK 2 0 SH CR/8 (SUTURE) ×3 IMPLANT
SUT SILK 2-0 18XBRD TIE 12 (SUTURE) ×1 IMPLANT
SUT SILK 3 0 SH CR/8 (SUTURE) ×3 IMPLANT
SUT SILK 4 0 (SUTURE) ×3
SUT SILK 4-0 18XBRD TIE 12 (SUTURE) ×1 IMPLANT
SUT STEEL 6MS V (SUTURE) ×3 IMPLANT
SUT STEEL SZ 6 DBL 3X14 BALL (SUTURE) ×12 IMPLANT
SUT TEM PAC WIRE 2 0 SH (SUTURE) ×3 IMPLANT
SUT VIC AB 1 CTX 18 (SUTURE) ×3 IMPLANT
SUT VIC AB 1 CTX 36 (SUTURE) ×4
SUT VIC AB 1 CTX36XBRD ANBCTR (SUTURE) ×2 IMPLANT
SUT VIC AB 2-0 CTX 27 (SUTURE) ×3 IMPLANT
SUT VIC AB 3-0 X1 27 (SUTURE) ×3 IMPLANT
SWAB CULTURE LIQ STUART DBL (MISCELLANEOUS) ×6 IMPLANT
SYSTEM SAHARA CHEST DRAIN ATS (WOUND CARE) ×3 IMPLANT
TAPE CLOTH SURG 4X10 WHT LF (GAUZE/BANDAGES/DRESSINGS) ×3 IMPLANT
TOWEL GREEN STERILE (TOWEL DISPOSABLE) ×3 IMPLANT
TOWEL GREEN STERILE FF (TOWEL DISPOSABLE) ×3 IMPLANT
TRAY FOLEY SILVER 16FR TEMP (SET/KITS/TRAYS/PACK) ×3 IMPLANT
UNDERPAD 30X30 (UNDERPADS AND DIAPERS) ×3 IMPLANT
VALVE MAGNA MITRAL 29MM (Prosthesis & Implant Heart) ×3 IMPLANT
VRC MALLEABLE SINGLE STG 28FR (MISCELLANEOUS) ×3
WATER STERILE IRR 1000ML POUR (IV SOLUTION) ×6 IMPLANT
YANKAUER SUCT BULB TIP NO VENT (SUCTIONS) ×3 IMPLANT

## 2017-08-29 NOTE — Anesthesia Postprocedure Evaluation (Signed)
Anesthesia Post Note  Patient: Designer, fashion/clothingBenford Hem  Procedure(s) Performed: MITRAL VALVE (MV) REPLACEMENT (N/A ) TRANSESOPHAGEAL ECHOCARDIOGRAM (TEE) (N/A )     Patient location during evaluation: SICU Anesthesia Type: General Level of consciousness: sedated Pain management: pain level controlled Vital Signs Assessment: post-procedure vital signs reviewed and stable Respiratory status: patient remains intubated per anesthesia plan and patient on ventilator - see flowsheet for VS Cardiovascular status: stable Postop Assessment: no apparent nausea or vomiting Anesthetic complications: no    Last Vitals:  Vitals:   08/29/17 1700 08/29/17 1713  BP: 113/64   Pulse: 88 91  Resp: 18 18  Temp: 36.8 C 36.8 C  SpO2: 100% 100%    Last Pain:  Vitals:   08/29/17 1600  TempSrc: Core (Comment)  PainSc:                  Yandiel Bergum COKER

## 2017-08-29 NOTE — Progress Notes (Signed)
Patient ID: Reginald [name], male   DOB: 01/11/1961, 57 y.o.   MRN: [id number]  TCTS Evening Rounds:   Hemodynamically stable  CI = 3.4 on milrinone 0.25, epi 2, neo  Still asleep on vent. Starting to move a little. Plan to keep on vent overnight. Urine output good  CT output low  CBC    Component Value Date/Time   WBC 13.5 (H) 08/29/2017 1353   RBC 3.11 (L) 08/29/2017 1353   HGB 8.3 (L) 08/29/2017 1353   HCT 27.2 (L) 08/29/2017 1353   PLT 203 08/29/2017 1353   MCV 87.5 08/29/2017 1353   MCH 26.7 08/29/2017 1353   MCHC 30.5 08/29/2017 1353   RDW 16.7 (H) 08/29/2017 1353   LYMPHSABS 2.9 08/29/2017 0534   MONOABS 0.5 08/29/2017 0534   EOSABS 0.4 08/29/2017 0534   BASOSABS 0.1 08/29/2017 0534     BMET    Component Value Date/Time   NA 127 (L) 08/29/2017 0534   K 5.2 (H) 08/29/2017 0534   CL 91 (L) 08/29/2017 0534   CO2 28 08/29/2017 0534   GLUCOSE 137 (H) 08/29/2017 0534   BUN 18 08/29/2017 0534   CREATININE 0.90 08/29/2017 0534   CALCIUM 8.8 (L) 08/29/2017 0534   GFRNONAA >60 08/29/2017 0534   GFRAA >60 08/29/2017 0534     A/P:  Stable postop course. Continue current plans

## 2017-08-29 NOTE — Anesthesia Procedure Notes (Signed)
Arterial Line Insertion Start/End1/03/2018 6:55 AM, 08/29/2017 7:05 AM Performed by: Dorie RankQuinn, Holly M, CRNA, CRNA  Patient location: Pre-op. Preanesthetic checklist: patient identified, IV checked, site marked, risks and benefits discussed, surgical consent, monitors and equipment checked, pre-op evaluation, timeout performed and anesthesia consent Lidocaine 1% used for infiltration and patient sedated Left, radial was placed Catheter size: 20 G Hand hygiene performed  and maximum sterile barriers used  Allen's test indicative of satisfactory collateral circulation Attempts: 1 Procedure performed without using ultrasound guided technique. Following insertion, dressing applied and Biopatch. Post procedure assessment: normal  Patient tolerated the procedure well with no immediate complications.

## 2017-08-29 NOTE — Anesthesia Preprocedure Evaluation (Signed)
Anesthesia Evaluation  Patient identified by MRN, date of birth, ID band Patient awake    Reviewed: Allergy & Precautions, NPO status , Patient's Chart, lab work & pertinent test results  Airway Mallampati: III  TM Distance: >3 FB Neck ROM: Limited    Dental  (+) Edentulous Upper, Edentulous Lower   Pulmonary former smoker,    + rhonchi  + decreased breath sounds      Cardiovascular  Rhythm:Regular Rate:Normal + Systolic murmurs    Neuro/Psych    GI/Hepatic   Endo/Other  diabetes  Renal/GU      Musculoskeletal   Abdominal (+) + obese,   Peds  Hematology   Anesthesia Other Findings   Reproductive/Obstetrics                             Anesthesia Physical Anesthesia Plan  ASA: III  Anesthesia Plan: General   Post-op Pain Management:    Induction: Intravenous  PONV Risk Score and Plan: Ondansetron and Midazolam  Airway Management Planned: Oral ETT  Additional Equipment: Arterial line, PA Cath, CVP, 3D TEE and Ultrasound Guidance Line Placement  Intra-op Plan:   Post-operative Plan: Post-operative intubation/ventilation  Informed Consent: I have reviewed the patients History and Physical, chart, labs and discussed the procedure including the risks, benefits and alternatives for the proposed anesthesia with the patient or authorized representative who has indicated his/her understanding and acceptance.     Plan Discussed with: CRNA  Anesthesia Plan Comments:         Anesthesia Quick Evaluation

## 2017-08-29 NOTE — Transfer of Care (Signed)
Immediate Anesthesia Transfer of Care Note  Patient: Reginald [name]  Procedure(s) Performed: MITRAL VALVE (MV) REPLACEMENT (N/A ) TRANSESOPHAGEAL ECHOCARDIOGRAM (TEE) (N/A )  Patient Location: ICU  Anesthesia Type:General  Level of Consciousness: Patient remains intubated per anesthesia plan  Airway & Oxygen Therapy: Patient remains intubated per anesthesia plan and Patient placed on Ventilator (see vital sign flow sheet for setting)  Post-op Assessment: Report given to RN and Post -op Vital signs reviewed and stable  Post vital signs: Reviewed and stable  Last Vitals:  Vitals:   08/29/17 0718 08/29/17 0719  BP:    Pulse: 82   Resp:  (!) 21  Temp:    SpO2: (!) 89% 90%    Last Pain:  Vitals:   08/29/17 0503  TempSrc: Oral  PainSc:       Patients Stated Pain Goal: 0 (08/28/17 2319)  Complications: No apparent anesthesia complications

## 2017-08-29 NOTE — Anesthesia Procedure Notes (Signed)
Central Venous Catheter Insertion Performed by: Lillia Abed, MD, anesthesiologist Start/End1/03/2018 6:45 AM, 08/29/2017 7:00 AM Patient location: Pre-op. Preanesthetic checklist: patient identified, IV checked, risks and benefits discussed, surgical consent, monitors and equipment checked, pre-op evaluation, timeout performed and anesthesia consent Position: Trendelenburg Lidocaine 1% used for infiltration and patient sedated Hand hygiene performed  and maximum sterile barriers used  Catheter size: 8.5 Fr PA cath was placed.MAC introducer Swan type:thermodilution Procedure performed using ultrasound guided technique. Ultrasound Notes:anatomy identified, needle tip was noted to be adjacent to the nerve/plexus identified, no ultrasound evidence of intravascular and/or intraneural injection and image(s) printed for medical record Attempts: 1 Following insertion, line sutured, dressing applied and Biopatch. Post procedure assessment: blood return through all ports, free fluid flow and no air  Patient tolerated the procedure well with no immediate complications.

## 2017-08-29 NOTE — OR Nursing (Signed)
1st call SICU charge [phone]

## 2017-08-29 NOTE — Progress Notes (Signed)
Pre Procedure note for inpatients:   Reginald [name] has been scheduled for Procedure(s): MITRAL VALVE (MV) REPLACEMENT (N/A) TRANSESOPHAGEAL ECHOCARDIOGRAM (TEE) (N/A) today. The various methods of treatment have been discussed with the patient. After consideration of the risks, benefits and treatment options the patient has consented to the planned procedure.   The patient has been seen and labs reviewed. There are no changes in the patient's condition to prevent proceeding with the planned procedure today.  Recent labs:  Lab Results  Component Value Date   WBC 10.7 (H) 08/29/2017   HGB 8.4 (L) 08/29/2017   HCT 27.3 (L) 08/29/2017   PLT 332 08/29/2017   GLUCOSE 137 (H) 08/29/2017   TRIG 290 (H) 08/08/2017   ALT 21 08/29/2017   AST 19 08/29/2017   NA 127 (L) 08/29/2017   K 5.2 (H) 08/29/2017   CL 91 (L) 08/29/2017   CREATININE 0.90 08/29/2017   BUN 18 08/29/2017   CO2 28 08/29/2017   TSH 2.141 08/21/2017   INR 1.25 08/02/2017   HGBA1C 5.8 (H) 08/21/2017    Mikey BussingPeter Van Trigt III, MD 08/29/2017 7:17 AM

## 2017-08-29 NOTE — Addendum Note (Signed)
Addendum  created 08/29/17 2033 by Kipp BroodJoslin, Solina Heron, MD   Diagnosis association updated

## 2017-08-29 NOTE — Anesthesia Procedure Notes (Signed)
Procedure Name: Intubation Date/Time: 08/29/2017 8:00 AM Performed by: Glynda Jaeger, CRNA Pre-anesthesia Checklist: Patient identified, Patient being monitored, Timeout performed, Emergency Drugs available and Suction available Patient Re-evaluated:Patient Re-evaluated prior to induction Oxygen Delivery Method: Circle System Utilized Preoxygenation: Pre-oxygenation with 100% oxygen Induction Type: IV induction Ventilation: Mask ventilation without difficulty Laryngoscope Size: Mac and 4 Grade View: Grade I Tube type: Oral Tube size: 8.0 mm Number of attempts: 1 Airway Equipment and Method: Stylet Placement Confirmation: ETT inserted through vocal cords under direct vision,  positive ETCO2 and breath sounds checked- equal and bilateral Secured at: 21 cm Tube secured with: Tape Dental Injury: Teeth and Oropharynx as per pre-operative assessment

## 2017-08-29 NOTE — Progress Notes (Signed)
Went to go examine the patient but he was already down for his Mitral Valve Replacement. He will be going to the SICU under the care of Dr. Donata ClayVan Trigt after the surgery is done and per my conversation with TCTS PA Erin Barrett, TCTS will become primary. TRH will be available as needed after the patient is extubated, and please re-notify us if we can be of assistance.

## 2017-08-29 NOTE — Progress Notes (Signed)
  Echocardiogram Echocardiogram Transesophageal has been performed.  Janalyn HarderWest, Dawnn Nam R 08/29/2017, 10:17 AM

## 2017-08-30 ENCOUNTER — Inpatient Hospital Stay (HOSPITAL_COMMUNITY): Payer: Medicaid Other

## 2017-08-30 ENCOUNTER — Encounter (HOSPITAL_COMMUNITY): Payer: Self-pay | Admitting: Cardiothoracic Surgery

## 2017-08-30 LAB — GLUCOSE, CAPILLARY
Glucose-Capillary: 111 mg/dL — ABNORMAL HIGH (ref 65–99)
Glucose-Capillary: 104 mg/dL — ABNORMAL HIGH (ref 65–99)
Glucose-Capillary: 106 mg/dL — ABNORMAL HIGH (ref 65–99)
Glucose-Capillary: 107 mg/dL — ABNORMAL HIGH (ref 65–99)
Glucose-Capillary: 100 mg/dL — ABNORMAL HIGH (ref 65–99)
Glucose-Capillary: 105 mg/dL — ABNORMAL HIGH (ref 65–99)
Glucose-Capillary: 112 mg/dL — ABNORMAL HIGH (ref 65–99)
Glucose-Capillary: 109 mg/dL — ABNORMAL HIGH (ref 65–99)
Glucose-Capillary: 120 mg/dL — ABNORMAL HIGH (ref 65–99)
Glucose-Capillary: 125 mg/dL — ABNORMAL HIGH (ref 65–99)
Glucose-Capillary: 103 mg/dL — ABNORMAL HIGH (ref 65–99)
Glucose-Capillary: 113 mg/dL — ABNORMAL HIGH (ref 65–99)
Glucose-Capillary: 130 mg/dL — ABNORMAL HIGH (ref 65–99)
Glucose-Capillary: 121 mg/dL — ABNORMAL HIGH (ref 65–99)
Glucose-Capillary: 115 mg/dL — ABNORMAL HIGH (ref 65–99)

## 2017-08-30 LAB — POCT I-STAT, CHEM 8
BUN: 18 mg/dL (ref 6–20)
BUN: 17 mg/dL (ref 6–20)
BUN: 17 mg/dL (ref 6–20)
BUN: 17 mg/dL (ref 6–20)
BUN: 18 mg/dL (ref 6–20)
BUN: 14 mg/dL (ref 6–20)
Calcium, Ion: 1.24 mmol/L (ref 1.15–1.40)
Calcium, Ion: 1.21 mmol/L (ref 1.15–1.40)
Calcium, Ion: 1.06 mmol/L — ABNORMAL LOW (ref 1.15–1.40)
Calcium, Ion: 1.06 mmol/L — ABNORMAL LOW (ref 1.15–1.40)
Calcium, Ion: 1.25 mmol/L (ref 1.15–1.40)
Calcium, Ion: 1.19 mmol/L (ref 1.15–1.40)
Chloride: 91 mmol/L — ABNORMAL LOW (ref 101–111)
Chloride: 94 mmol/L — ABNORMAL LOW (ref 101–111)
Chloride: 92 mmol/L — ABNORMAL LOW (ref 101–111)
Chloride: 93 mmol/L — ABNORMAL LOW (ref 101–111)
Chloride: 94 mmol/L — ABNORMAL LOW (ref 101–111)
Chloride: 93 mmol/L — ABNORMAL LOW (ref 101–111)
Creatinine, Ser: 0.8 mg/dL (ref 0.61–1.24)
Creatinine, Ser: 0.8 mg/dL (ref 0.61–1.24)
Creatinine, Ser: 0.6 mg/dL — ABNORMAL LOW (ref 0.61–1.24)
Creatinine, Ser: 0.6 mg/dL — ABNORMAL LOW (ref 0.61–1.24)
Creatinine, Ser: 0.7 mg/dL (ref 0.61–1.24)
Creatinine, Ser: 0.8 mg/dL (ref 0.61–1.24)
Glucose, Bld: 141 mg/dL — ABNORMAL HIGH (ref 65–99)
Glucose, Bld: 152 mg/dL — ABNORMAL HIGH (ref 65–99)
Glucose, Bld: 160 mg/dL — ABNORMAL HIGH (ref 65–99)
Glucose, Bld: 183 mg/dL — ABNORMAL HIGH (ref 65–99)
Glucose, Bld: 204 mg/dL — ABNORMAL HIGH (ref 65–99)
Glucose, Bld: 137 mg/dL — ABNORMAL HIGH (ref 65–99)
HCT: 26 % — ABNORMAL LOW (ref 39.0–52.0)
HCT: 24 % — ABNORMAL LOW (ref 39.0–52.0)
HCT: 28 % — ABNORMAL LOW (ref 39.0–52.0)
HCT: 20 % — ABNORMAL LOW (ref 39.0–52.0)
HCT: 24 % — ABNORMAL LOW (ref 39.0–52.0)
HCT: 26 % — ABNORMAL LOW (ref 39.0–52.0)
Hemoglobin: 8.8 g/dL — ABNORMAL LOW (ref 13.0–17.0)
Hemoglobin: 8.2 g/dL — ABNORMAL LOW (ref 13.0–17.0)
Hemoglobin: 9.5 g/dL — ABNORMAL LOW (ref 13.0–17.0)
Hemoglobin: 6.8 g/dL — CL (ref 13.0–17.0)
Hemoglobin: 8.2 g/dL — ABNORMAL LOW (ref 13.0–17.0)
Hemoglobin: 8.8 g/dL — ABNORMAL LOW (ref 13.0–17.0)
Potassium: 5.2 mmol/L — ABNORMAL HIGH (ref 3.5–5.1)
Potassium: 5.4 mmol/L — ABNORMAL HIGH (ref 3.5–5.1)
Potassium: 4.9 mmol/L (ref 3.5–5.1)
Potassium: 5 mmol/L (ref 3.5–5.1)
Potassium: 5 mmol/L (ref 3.5–5.1)
Potassium: 4.8 mmol/L (ref 3.5–5.1)
Sodium: 131 mmol/L — ABNORMAL LOW (ref 135–145)
Sodium: 129 mmol/L — ABNORMAL LOW (ref 135–145)
Sodium: 132 mmol/L — ABNORMAL LOW (ref 135–145)
Sodium: 132 mmol/L — ABNORMAL LOW (ref 135–145)
Sodium: 132 mmol/L — ABNORMAL LOW (ref 135–145)
Sodium: 132 mmol/L — ABNORMAL LOW (ref 135–145)
TCO2: 30 mmol/L (ref 22–32)
TCO2: 29 mmol/L (ref 22–32)
TCO2: 29 mmol/L (ref 22–32)
TCO2: 33 mmol/L — ABNORMAL HIGH (ref 22–32)
TCO2: 28 mmol/L (ref 22–32)
TCO2: 29 mmol/L (ref 22–32)

## 2017-08-30 LAB — BLOOD GAS, ARTERIAL
Acid-Base Excess: 4.1 mmol/L — ABNORMAL HIGH (ref 0.0–2.0)
Bicarbonate: 28.3 mmol/L — ABNORMAL HIGH (ref 20.0–28.0)
Drawn by: 36496
FIO2: 50
MECHVT: 680 mL
O2 Saturation: 99.1 %
PEEP: 5 cmH2O
Patient temperature: 98.6
RATE: 12 resp/min
pCO2 arterial: 44 mmHg (ref 32.0–48.0)
pH, Arterial: 7.425 (ref 7.350–7.450)
pO2, Arterial: 125 mmHg — ABNORMAL HIGH (ref 83.0–108.0)

## 2017-08-30 LAB — POCT I-STAT 3, ART BLOOD GAS (G3+)
Acid-Base Excess: 2 mmol/L (ref 0.0–2.0)
Acid-Base Excess: 5 mmol/L — ABNORMAL HIGH (ref 0.0–2.0)
Acid-Base Excess: 2 mmol/L (ref 0.0–2.0)
Acid-Base Excess: 1 mmol/L (ref 0.0–2.0)
Acid-Base Excess: 3 mmol/L — ABNORMAL HIGH (ref 0.0–2.0)
Acid-Base Excess: 3 mmol/L — ABNORMAL HIGH (ref 0.0–2.0)
Bicarbonate: 29.1 mmol/L — ABNORMAL HIGH (ref 20.0–28.0)
Bicarbonate: 30.1 mmol/L — ABNORMAL HIGH (ref 20.0–28.0)
Bicarbonate: 28.4 mmol/L — ABNORMAL HIGH (ref 20.0–28.0)
Bicarbonate: 27.3 mmol/L (ref 20.0–28.0)
Bicarbonate: 27.8 mmol/L (ref 20.0–28.0)
Bicarbonate: 28.1 mmol/L — ABNORMAL HIGH (ref 20.0–28.0)
O2 Saturation: 99 %
O2 Saturation: 100 %
O2 Saturation: 96 %
O2 Saturation: 98 %
O2 Saturation: 97 %
O2 Saturation: 91 %
Patient temperature: 37
Patient temperature: 37.3
Patient temperature: 37.3
TCO2: 31 mmol/L (ref 22–32)
TCO2: 31 mmol/L (ref 22–32)
TCO2: 30 mmol/L (ref 22–32)
TCO2: 29 mmol/L (ref 22–32)
TCO2: 29 mmol/L (ref 22–32)
TCO2: 29 mmol/L (ref 22–32)
pCO2 arterial: 62.3 mmHg — ABNORMAL HIGH (ref 32.0–48.0)
pCO2 arterial: 44.6 mmHg (ref 32.0–48.0)
pCO2 arterial: 52.4 mmHg — ABNORMAL HIGH (ref 32.0–48.0)
pCO2 arterial: 53.7 mmHg — ABNORMAL HIGH (ref 32.0–48.0)
pCO2 arterial: 44.1 mmHg (ref 32.0–48.0)
pCO2 arterial: 43.8 mmHg (ref 32.0–48.0)
pH, Arterial: 7.278 — ABNORMAL LOW (ref 7.350–7.450)
pH, Arterial: 7.438 (ref 7.350–7.450)
pH, Arterial: 7.341 — ABNORMAL LOW (ref 7.350–7.450)
pH, Arterial: 7.314 — ABNORMAL LOW (ref 7.350–7.450)
pH, Arterial: 7.409 (ref 7.350–7.450)
pH, Arterial: 7.417 (ref 7.350–7.450)
pO2, Arterial: 190 mmHg — ABNORMAL HIGH (ref 83.0–108.0)
pO2, Arterial: 320 mmHg — ABNORMAL HIGH (ref 83.0–108.0)
pO2, Arterial: 85 mmHg (ref 83.0–108.0)
pO2, Arterial: 113 mmHg — ABNORMAL HIGH (ref 83.0–108.0)
pO2, Arterial: 90 mmHg (ref 83.0–108.0)
pO2, Arterial: 63 mmHg — ABNORMAL LOW (ref 83.0–108.0)

## 2017-08-30 LAB — PREPARE FRESH FROZEN PLASMA
UNIT DIVISION: 0
Unit division: 0

## 2017-08-30 LAB — CBC
HCT: 27 % — ABNORMAL LOW (ref 39.0–52.0)
HEMATOCRIT: 24.1 % — AB (ref 39.0–52.0)
HEMOGLOBIN: 7.4 g/dL — AB (ref 13.0–17.0)
Hemoglobin: 8.6 g/dL — ABNORMAL LOW (ref 13.0–17.0)
MCH: 26.6 pg (ref 26.0–34.0)
MCH: 28.1 pg (ref 26.0–34.0)
MCHC: 30.7 g/dL (ref 30.0–36.0)
MCHC: 31.9 g/dL (ref 30.0–36.0)
MCV: 86.7 fL (ref 78.0–100.0)
MCV: 88.2 fL (ref 78.0–100.0)
PLATELETS: 176 10*3/uL (ref 150–400)
Platelets: 189 10*3/uL (ref 150–400)
RBC: 2.78 MIL/uL — ABNORMAL LOW (ref 4.22–5.81)
RBC: 3.06 MIL/uL — ABNORMAL LOW (ref 4.22–5.81)
RDW: 16.7 % — ABNORMAL HIGH (ref 11.5–15.5)
RDW: 17 % — AB (ref 11.5–15.5)
WBC: 8.7 10*3/uL (ref 4.0–10.5)
WBC: 7.5 10*3/uL (ref 4.0–10.5)

## 2017-08-30 LAB — POCT I-STAT 4, (NA,K, GLUC, HGB,HCT)
Glucose, Bld: 148 mg/dL — ABNORMAL HIGH (ref 65–99)
HCT: 27 % — ABNORMAL LOW (ref 39.0–52.0)
Hemoglobin: 9.2 g/dL — ABNORMAL LOW (ref 13.0–17.0)
Potassium: 4.8 mmol/L (ref 3.5–5.1)
Sodium: 135 mmol/L (ref 135–145)

## 2017-08-30 LAB — COOXEMETRY PANEL
Carboxyhemoglobin: 1.7 % — ABNORMAL HIGH (ref 0.5–1.5)
Methemoglobin: 1.1 % (ref 0.0–1.5)
O2 Saturation: 59.9 %
Total hemoglobin: 9.8 g/dL — ABNORMAL LOW (ref 12.0–16.0)

## 2017-08-30 LAB — BASIC METABOLIC PANEL
Anion gap: 7 (ref 5–15)
BUN: 14 mg/dL (ref 6–20)
CHLORIDE: 97 mmol/L — AB (ref 101–111)
CO2: 27 mmol/L (ref 22–32)
Calcium: 8.4 mg/dL — ABNORMAL LOW (ref 8.9–10.3)
Creatinine, Ser: 0.79 mg/dL (ref 0.61–1.24)
GFR calc non Af Amer: >60 mL/min (ref >60)
Glucose, Bld: 100 mg/dL — ABNORMAL HIGH (ref 65–99)
POTASSIUM: 4.7 mmol/L (ref 3.5–5.1)
Sodium: 131 mmol/L — ABNORMAL LOW (ref 135–145)

## 2017-08-30 LAB — BPAM FFP
BLOOD PRODUCT EXPIRATION DATE: 201901092359
Blood Product Expiration Date: 201901092359
ISSUE DATE / TIME: 201901081204
ISSUE DATE / TIME: 201901081204
UNIT TYPE AND RH: 7300
Unit Type and Rh: 7300

## 2017-08-30 LAB — CREATININE, SERUM
Creatinine, Ser: 0.84 mg/dL (ref 0.61–1.24)
GFR calc Af Amer: >60 mL/min (ref >60)
GFR calc non Af Amer: >60 mL/min (ref >60)

## 2017-08-30 LAB — MAGNESIUM
Magnesium: 2.3 mg/dL (ref 1.7–2.4)
Magnesium: 2 mg/dL (ref 1.7–2.4)

## 2017-08-30 LAB — PREPARE RBC (CROSSMATCH)

## 2017-08-30 MED ORDER — SODIUM CHLORIDE 0.9% FLUSH: 10.0000 mL | INTRAVENOUS | Status: DC | PRN

## 2017-08-30 MED ORDER — CHLORHEXIDINE GLUCONATE CLOTH 2 % EX PADS: 6.0000 | MEDICATED_PAD | Freq: Every day | CUTANEOUS | Status: DC

## 2017-08-30 MED ORDER — FUROSEMIDE 10 MG/ML IJ SOLN
40.0000 mg | Freq: Once | INTRAMUSCULAR | Status: AC
  Administered 2017-08-30: 40 mg via INTRAVENOUS
  Filled 2017-08-30: qty 4

## 2017-08-30 MED ORDER — WARFARIN - PHYSICIAN DOSING INPATIENT
Freq: Every day | Status: DC
  Administered 2017-09-01 – 2017-09-03 (×2)

## 2017-08-30 MED ORDER — INSULIN DETEMIR 100 UNIT/ML ~~LOC~~ SOLN
15.0000 [IU] | Freq: Two times a day (BID) | SUBCUTANEOUS | Status: DC
  Administered 2017-08-30 – 2017-09-06 (×15): 15 [IU] via SUBCUTANEOUS
  Filled 2017-08-30 (×16): qty 0.15

## 2017-08-30 MED ORDER — SODIUM CHLORIDE 0.9 % IV SOLN: Freq: Once | INTRAVENOUS | Status: DC

## 2017-08-30 MED ORDER — SODIUM CHLORIDE 0.9% FLUSH
10.0000 mL | Freq: Two times a day (BID) | INTRAVENOUS | Status: DC
  Administered 2017-08-30: 10 mL
  Administered 2017-08-31 – 2017-09-01 (×2): 20 mL
  Administered 2017-09-05: 10 mL

## 2017-08-30 MED ORDER — INSULIN ASPART 100 UNIT/ML ~~LOC~~ SOLN
0.0000 [IU] | SUBCUTANEOUS | Status: DC
  Administered 2017-08-30 – 2017-08-31 (×2): 2 [IU] via SUBCUTANEOUS
  Administered 2017-08-31: 4 [IU] via SUBCUTANEOUS
  Administered 2017-08-31 (×2): 2 [IU] via SUBCUTANEOUS

## 2017-08-30 MED ORDER — CHLORHEXIDINE GLUCONATE CLOTH 2 % EX PADS
6.0000 | MEDICATED_PAD | Freq: Every day | CUTANEOUS | Status: DC
  Administered 2017-08-31 – 2017-09-06 (×5): 6 via TOPICAL

## 2017-08-30 MED ORDER — WARFARIN SODIUM 2 MG PO TABS
2.0000 mg | ORAL_TABLET | Freq: Every day | ORAL | Status: AC
  Administered 2017-08-30: 2 mg via ORAL
  Filled 2017-08-30: qty 1

## 2017-08-30 MED ORDER — FUROSEMIDE 10 MG/ML IJ SOLN
40.0000 mg | Freq: Two times a day (BID) | INTRAMUSCULAR | Status: DC
  Administered 2017-08-30 – 2017-08-31 (×2): 40 mg via INTRAVENOUS
  Filled 2017-08-30 (×2): qty 4

## 2017-08-30 MED ORDER — FENTANYL CITRATE (PF) 100 MCG/2ML IJ SOLN
50.0000 ug | INTRAMUSCULAR | Status: DC | PRN
  Administered 2017-08-30 – 2017-08-31 (×7): 50 ug via INTRAVENOUS
  Filled 2017-08-30 (×6): qty 2

## 2017-08-30 MED ORDER — FUROSEMIDE 10 MG/ML IJ SOLN: 20.0000 mg | Freq: Once | INTRAMUSCULAR | Status: DC

## 2017-08-30 MED ORDER — FUROSEMIDE 10 MG/ML IJ SOLN
40.0000 mg | Freq: Two times a day (BID) | INTRAMUSCULAR | Status: DC
Start: 2017-08-31 — End: 2017-08-30

## 2017-08-30 MED ORDER — SODIUM CHLORIDE 0.9 % IV SOLN: 0.0000 ug/min | INTRAVENOUS | Status: DC

## 2017-08-30 MED ORDER — DEXMEDETOMIDINE HCL IN NACL 400 MCG/100ML IV SOLN
0.0000 ug/kg/h | INTRAVENOUS | Status: DC
  Administered 2017-08-30: 0.6 ug/kg/h via INTRAVENOUS
  Filled 2017-08-30: qty 100

## 2017-08-30 MED FILL — Sodium Chloride IV Soln 0.9%: INTRAVENOUS | Qty: 2000 | Status: AC

## 2017-08-30 MED FILL — Lidocaine HCl IV Inj 20 MG/ML: INTRAVENOUS | Qty: 5 | Status: AC

## 2017-08-30 MED FILL — Heparin Sodium (Porcine) Inj 1000 Unit/ML: INTRAMUSCULAR | Qty: 40 | Status: AC

## 2017-08-30 MED FILL — Heparin Sodium (Porcine) Inj 1000 Unit/ML: INTRAMUSCULAR | Qty: 30 | Status: AC

## 2017-08-30 MED FILL — Dexmedetomidine HCl in NaCl 0.9% IV Soln 400 MCG/100ML: INTRAVENOUS | Qty: 100 | Status: AC

## 2017-08-30 MED FILL — Sodium Bicarbonate IV Soln 8.4%: INTRAVENOUS | Qty: 50 | Status: AC

## 2017-08-30 MED FILL — Electrolyte-R (PH 7.4) Solution: INTRAVENOUS | Qty: 3000 | Status: AC

## 2017-08-30 MED FILL — Magnesium Sulfate Inj 50%: INTRAMUSCULAR | Qty: 10 | Status: AC

## 2017-08-30 MED FILL — Calcium Chloride Inj 10%: INTRAVENOUS | Qty: 10 | Status: AC

## 2017-08-30 MED FILL — Potassium Chloride Inj 2 mEq/ML: INTRAVENOUS | Qty: 40 | Status: AC

## 2017-08-30 MED FILL — Mannitol IV Soln 20%: INTRAVENOUS | Qty: 500 | Status: AC

## 2017-08-30 NOTE — Progress Notes (Signed)
PT Cancellation Note  Patient Details Name: Reginald [name] MRN: [id number] DOB: 01/21/1961   Cancelled Treatment:    Reason Eval/Treat Not Completed: Fatigue/lethargy limiting ability to participate. Per RN, pt just returned to bed after sitting up a few hours. Pt requesting PT return for treatment tomorrow. Will follow-up.  Ina HomesJaclyn Kamill Fulbright, PT, DPT Acute Rehab Services  Pager: [phone]  Malachy ChamberJaclyn L Sueko Dimichele 08/30/2017, 3:11 PM

## 2017-08-30 NOTE — Plan of Care (Signed)
Care plan updated.

## 2017-08-30 NOTE — Op Note (Signed)
NAME:  Reginald [name], Reginald [name]                ACCOUNT NO.:  MEDICAL RECORD NO.:  [id number]  LOCATION:                                 FACILITY:  PHYSICIAN:  Kerin Perna, M.D.  DATE OF BIRTH:  1961-03-15  DATE OF PROCEDURE: DATE OF DISCHARGE:                              OPERATIVE REPORT   OPERATIONS: 1. Mitral valve replacement for endocarditis with a 29-mm Edwards     pericardial Magna Ease valve (serial [id number]). 2. Transesophageal echocardiogram. 3. Cardiopulmonary bypass with hypothermic cardioplegic arrest.  PREOPERATIVE DIAGNOSES: 1. 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     Large vegetation of the posterior leaflet with infection and     perforation of the anterior leaflet. 2. Severe pulmonary hypertension secondary to mitral regurgitation. 3. Chronic obstructive pulmonary disease, history of chronic tobacco     use. 4. 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  POSTOPERATIVE DIAGNOSES: 1. 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     Large vegetation of the posterior leaflet with infection and     perforation of the anterior leaflet. 2. Severe pulmonary hypertension secondary to mitral regurgitation. 3. Chronic obstructive pulmonary disease, history of chronic tobacco     use. 4. 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  SURGEON:  Kerin Perna, MD.  ASSISTANT:  Rowe Clack, PA-C.  ANESTHESIA:  General by Dr. Kipp Brood.  CLINICAL NOTE:  The patient is a 57 year old Caucasian male with history of smoker and alcohol abuse, who was admitted to the hospital with heart failure and failure to thrive.  He was found to have positive blood cultures for MRSA.  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  He was placed on vancomycin initially and then transitioned to daptomycin by Infectious Disease.  An echocardiogram showed endocarditis of the mitral valve with vegetation of the posterior leaflet and leaflet perforation of the anterior leaflet.  There was no evidence of endocarditis of the aortic valve.  The patient was severely debilitated, nonambulatory and not a candidate for surgery initially.  However, after several weeks of IV antibiotics, his blood culture was cleared and his functional status improved, especially after he spent sometime on the inpatient cardiac rehab unit for rehab.  Left and right heart catheterizations were eventually performed showing no significant coronary artery disease with right heart data showing severe pulmonary hypertension with PA pressures of 70/30, but cardiac output was preserved with an index of 2.2.  The patient had no history of IV drug abuse and drug screen was negative.  He was also hepatitis C negative. He underwent orthopantomogram and dental evaluation was found to have several necrotic teeth which were extracted by Dr. Kristin Bruins prior to surgery.  After he was ambulatory, his blood cultures had been negative for over 2 weeks and his dental surgery was performed.  He was felt to be a candidate for mitral valve replacement.  I discussed the procedure of sternotomy and mitral valve replacement  with the patient and his wife. I discussed the plan to use a bioprosthetic tissue valve, which would be preferred at age 57, especially with his history of alcohol abuse and noncompliance.  I discussed the details of surgery including use of general anesthesia and cardiopulmonary bypass, the location of the surgical incision, and the expected postoperative recovery.  I discussed with the patient the risks to him of mitral valve replacement including risks of bleeding, blood transfusion requirement, stroke, recurrent infection of the new  valve, postoperative heart block requiring pacemaker, postoperative pulmonary problems including pleural effusion or pneumonia, which would require prolonged ventilator dependence and postoperative death.  After reviewing these issues, he demonstrated his understanding and agreed to proceed with surgery under what I felt was an informed consent.  OPERATIVE FINDINGS: 1. Totally destroyed mitral valve, which precluded repair. 2. Successful replacement after excision of the valve and the infected     material and replaced with a 29-mm pericardial valve.  LV function     was preserved at the separation from cardiopulmonary bypass. 3. Intraoperative anemia requiring 2 units of packed cell transfusion     for anemia of chronic disease.  DESCRIPTION OF PROCEDURE:  The patient was brought directly from the preop holding area to the operating room and placed supine on the operating table.  General anesthesia was induced.  A transesophageal echo probe was placed by the anesthesia team.  This confirmed the preoperative findings of severe mitral regurgitation.  There was no significant tricuspid regurgitation or aortic insufficiency.  LV function appeared to be preserved.  RV was dilated with moderate dysfunction.  The patient was prepped and draped as a sterile field.  A proper time- out was performed.  A sternal incision was made.  The sternum was retracted and the pericardium was opened and suspended.  The RV and right heart were dilated.  Heparin was administered.  Pursestrings were placed in the ascending aorta and in the right atrium for bicaval drainage.  When the ACT was documented as being therapeutic, the patient was cannulated and placed on bypass.  Caval tapes were placed around the SVC and IVC and the interatrial groove was dissected.  Cardioplegia cannulas were placed both antegrade and retrograde cold blood cardioplegia and the patient was cooled to 32 degrees.  The  aortic crossclamp was applied and 1 L of cold blood cardioplegia was delivered in split doses between the antegrade aortic and retrograde coronary sinus catheters.  There was good cardioplegic arrest, and septal temperature dropped less than 12 degrees.  Cardioplegia was delivered every 20 minutes.  A left atriotomy was performed and the atrial retractors were placed. Exposure of the valve was very difficult due to the patient's obese body habitus and his dilated right heart.  The valve was inspected.  It was noted to be severely destroyed and findings correlated with the preoperative echo.  There was a large 2-cm vegetation on the P3 segment of the posterior leaflet and there was a perforation in the A3 segment of the anterior leaflet.  The leaflet tissue was totally excised.  The material was sent for culture.  The material was also [facility] [place] and for the Infectious Disease Department.  The annulus was irrigated with copious amounts of cold saline.  The annulus was mildly fragile and soft, but it was felt that the sutures for valve replacement would withhold.  The annulus was sized to a 29-mm Magna Ease valve.  2-0 Ethibond sutures were then placed around the circumference of the anulus which was tedious because of difficult exposure of the anterior anulus in the area of the aortic valve.  After the sutures were placed, the valve was prepared according to the protocol and the sutures were placed through the sewing ring.  The valve was seated and fit to the anulus with good confirmation.  The sutures were tied and the valve was inspected.  The ventricle was filled with saline and there was minimal central expected leak.  There was no evidence of space between the sutures for perivalvular leak.  Exposure of the atrial appendage was difficult, so the appendage was not oversewn - the patient also had no history of atrial fib.  The  atriotomy was then closed with 2 layers of running 3-0 Prolene.  The operative field had been insufflated with CO2 during the open heart procedure.  At this point, volume was left into the heart and the usual de-airing maneuvers were performed to remove air from the left side of the heart as well as a dose of retrograde warm blood cardioplegia- hotshot.  The crossclamp was then removed.  Heart resumed a spontaneous rhythm.  The LV vent was used to scavenge any intracardiac air.  The patient was rewarmed and reperfused. Temporary pacing wires were placed.  The atriotomy incision appeared to be hemostatic.  The lungs were expanded and ventilator was resumed. Prior to re-expanding the lungs, both pleural spaces were drained of approximately 500 to a liter of transudative fluid from his heart failure.  The patient was placed on low-dose milrinone and epinephrine and was weaned successfully off cardiopulmonary bypass with stable hemodynamics and cardiac output of 5 L/min.  The patient was atrially paced for a slow sinus rhythm.  TEE showed the mitral valve prosthesis to be functioning well without mitral regurgitation or transvalvular gradient or mitral stenosis.  LV function was preserved.  Protamine was administered without adverse reaction.  There was still diffuse coagulopathy.  We transfused 2 units of FFP because of the patient's longstanding heart failure and underlying liver disease.  This improved hemostasis.  The superior pericardium was closed.  Anterior mediastinal and bilateral pleural tubes were placed and brought out through separate incisions. The sternum was closed with interrupted steel wire.  The patient remained stable.  The pectoralis fascia was closed with a running #1 Vicryl.  The subcutaneous and skin layers were closed with running Vicryl and sterile dressings were applied.  The chest tubes were connected to an underwater seal Pleur-evac drainage system.   Total cardiopulmonary bypass time was 142 minutes.  The patient returned to the ICU in critical but stable condition.     Kerin Perna, M.D.     PV/MEDQ  D:  08/29/2017  T:  08/29/2017  Job:  [id number]  cc:   Lacretia Leigh. Ninetta Lights, M.D. Marca Ancona, MD

## 2017-08-30 NOTE — Progress Notes (Addendum)
T[facility] DAILY ICU PROGRESS NOTE                   [address]            [place] [address]          [phone]   1 Day Post-Op Procedure(s) (LRB): MITRAL VALVE (MV) REPLACEMENT (N/A) TRANSESOPHAGEAL ECHOCARDIOGRAM (TEE) (N/A)  Total Length of Stay:  LOS: 10 days   Subjective: Intubated, follows simple commands moving all 4 extrems. Appropriately answers yes/no questions  Objective: Vital signs in last 24 hours: Temp:  [98.1 F (36.7 C)-100 F (37.8 C)] 99.3 F (37.4 C) (01/09 0715) Pulse Rate:  [88-97] 89 (01/09 0715) Cardiac Rhythm: Atrial paced (01/09 0400) Resp:  [11-29] 13 (01/09 0715) BP: (95-170)/(55-74) 103/63 (01/09 0700) SpO2:  [87 %-100 %] 99 % (01/09 0715) Arterial Line BP: (81-138)/(41-64) 91/63 (01/09 0715) FiO2 (%):  [50 %-100 %] 50 % (01/09 0400) Weight:  [238 lb 15.7 oz (108.4 kg)] 238 lb 15.7 oz (108.4 kg) (01/09 0414)  Filed Weights   08/28/17 0405 08/29/17 0511 08/30/17 0414  Weight: 233 lb 6.4 oz (105.9 kg) 231 lb (104.8 kg) 238 lb 15.7 oz (108.4 kg)    Weight change: 7 lb 15.7 oz (3.619 kg)   Hemodynamic parameters for last 24 hours: PAP: (34-52)/(15-29) 39/20 CO:  [6.2 L/min-9.3 L/min] 7.5 L/min CI:  [2.8 L/min/m2-4.2 L/min/m2] 3.4 L/min/m2  Intake/Output from previous day: 01/08 0701 - 01/09 0700 In: 6970.2 [I.V.:5470.2; Blood:300; IV Piggyback:1200] Out: 4510 [Urine:3555; Blood:600; Chest Tube:355]  Intake/Output this shift: Total I/O In: 30 [Blood:30] Out: -    Vent Mode: PRVC;PSV;SIMV FiO2 (%):  [50 %-100 %] 50 % Set Rate:  [12 bmp-18 bmp] 12 bmp Vt Set:  [680 mL] 680 mL PEEP:  [5 cmH20-7 cmH20] 5 cmH20 Pressure Support:  [10 cmH20] 10 cmH20 Plateau Pressure:  [25 WNI62-70 cmH20] 25 cmH20  Current Meds: Scheduled Meds: . acetaminophen  1,000 mg Oral Q6H   Or  . acetaminophen (TYLENOL) oral liquid 160 mg/5 mL  1,000 mg Per Tube Q6H  . aspirin EC  325 mg Oral Daily   Or  . aspirin  324 mg Per Tube Daily    . bisacodyl  10 mg Oral Daily   Or  . bisacodyl  10 mg Rectal Daily  . chlorhexidine gluconate (MEDLINE KIT)  15 mL Mouth Rinse BID  . docusate sodium  200 mg Oral Daily  . insulin regular  0-10 Units Intravenous TID WC  . levalbuterol  1.25 mg Nebulization Q6H  . mouth rinse  15 mL Mouth Rinse 10 times per day  . metoCLOPramide (REGLAN) injection  10 mg Intravenous Q6H  . metoprolol tartrate  12.5 mg Oral BID   Or  . metoprolol tartrate  12.5 mg Per Tube BID  . mometasone-formoterol  2 puff Inhalation BID  . [START ON 08/31/2017] pantoprazole  40 mg Oral Daily  . sodium chloride flush  3 mL Intravenous Q12H   Continuous Infusions: . sodium chloride 20 mL/hr at 08/30/17 0700  . sodium chloride    . sodium chloride 20 mL/hr at 08/30/17 0700  . sodium chloride    . albumin human Stopped (08/29/17 2044)  . cefUROXime (ZINACEF)  IV Stopped (08/30/17 0420)  . DAPTOmycin (CUBICIN)  IV Stopped (08/28/17 1806)  . dexmedetomidine (PRECEDEX) IV infusion 0.4 mcg/kg/hr (08/30/17 0700)  . EPINEPHrine 4 mg in dextrose 5% 250 mL infusion (16 mcg/mL) 2 mcg/min (08/30/17 0700)  .  insulin (NOVOLIN-R) infusion 3.6 Units/hr (08/30/17 0700)  . lactated ringers Stopped (08/29/17 1400)  . lactated ringers 20 mL/hr at 08/30/17 0700  . milrinone 0.25 mcg/kg/min (08/30/17 0700)  . nitroGLYCERIN Stopped (08/29/17 1400)  . norepinephrine (LEVOPHED) Adult infusion Stopped (08/29/17 1500)  . phenylephrine (NEO-SYNEPHRINE) Adult infusion 40 mcg/min (08/30/17 0700)   PRN Meds:.sodium chloride, albumin human, fentaNYL (SUBLIMAZE) injection, metoprolol tartrate, midazolam, ondansetron (ZOFRAN) IV, oxyCODONE, sodium chloride flush, traMADol  General appearance: alert, cooperative and no distress Neurologic: intact Heart: paced at 90, without rub or murmur Lungs: dim in lower fields Abdomen: soft, nontender Extremities: no edema Wound: dressings CDI  Lab Results: CBC: Recent Labs    08/29/17 1945  08/29/17 1956 08/30/17 0345  WBC 11.5*  --  8.7  HGB 7.6* 8.2* 7.4*  HCT 24.7* 24.0* 24.1*  PLT 191  --  189   BMET:  Recent Labs    08/29/17 0534  08/29/17 1956 08/30/17 0345  NA 127*  --  134* 131*  K 5.2*  --  4.8 4.7  CL 91*  --  96* 97*  CO2 28  --   --  27  GLUCOSE 137*  --  129* 100*  BUN 18  --  16 14  CREATININE 0.90   < > 0.80 0.79  CALCIUM 8.8*  --   --  8.4*   < > = values in this interval not displayed.    CMET: Lab Results  Component Value Date   WBC 8.7 08/30/2017   HGB 7.4 (L) 08/30/2017   HCT 24.1 (L) 08/30/2017   PLT 189 08/30/2017   GLUCOSE 100 (H) 08/30/2017   TRIG 290 (H) 08/08/2017   ALT 21 08/29/2017   AST 19 08/29/2017   NA 131 (L) 08/30/2017   K 4.7 08/30/2017   CL 97 (L) 08/30/2017   CREATININE 0.79 08/30/2017   BUN 14 08/30/2017   CO2 27 08/30/2017   TSH 2.141 08/21/2017   INR 1.50 08/29/2017   HGBA1C 5.8 (H) 08/21/2017      PT/INR:  Recent Labs    08/29/17 1353  LABPROT 17.9*  INR 1.50   ABG    Component Value Date/Time   PHART 7.425 08/30/2017 0411   PCO2ART 44.0 08/30/2017 0411   PO2ART 125 (H) 08/30/2017 0411   HCO3 28.3 (H) 08/30/2017 0411   TCO2 26 08/29/2017 2019   O2SAT 99.1 08/30/2017 0411    Radiology: Dg Chest Port 1 View  Result Date: 08/29/2017 CLINICAL DATA:  Intubation. EXAM: PORTABLE CHEST 1 VIEW COMPARISON:  08/27/2017. FINDINGS: Endotracheal tube tip noted 4.5 cm above the carina. Swan-Ganz catheter noted with tip over the pulmonary outflow tract. Bilateral chest tubes are noted. Prior cardiac pacer. Cardiomegaly with pulmonary vascular prominence and bilateral pulmonary infiltrates suggesting pulmonary edema. Low lung volumes with mild basilar atelectasis. No prominent pleural effusion. No pneumothorax IMPRESSION: 1. Lines and tubes in position as above. Bilateral chest tubes are present. No pneumothorax. 2. Prior cardiac valve replacement. Cardiomegaly with bilateral pulmonary infiltrates suggesting  pulmonary edema. Low lung volumes with basilar atelectasis. Electronically Signed   By: Marcello Moores  Register   On: 08/29/2017 14:06     Assessment/Plan: S/P Procedure(s) (LRB): MITRAL VALVE (MV) REPLACEMENT (N/A) TRANSESOPHAGEAL ECHOCARDIOGRAM (TEE) (N/A)  1 doing well 2 hemodyn stable with some pulm HTN, currently apaced - EKG with afib and slow ventricular response (50's)- wean epi and neo as able. Needs diuresis- lasix ordered, check COOX 3 receiving blood for  Abl anemia 4 neuro  grossly intact 5 wean  vent- abg looks good 6 mild hyponatremia and hypochloremia- monitor, renal fxn in normal range 7 glucose control is good 8 conts current abx- no leukocytosis, Tmax 99.3 9 not much CT drainage - d/c soon    Reginald [name] 08/30/2017 7:29 AM   Patient neuro intact on vent with stable hemodynamics on low dose pressors CXR wet - lasix ordered Expected postop blood loss  anemia and preop anemia of chronic disease Proceed with vent wean Start coumadin for MVR [ short term] patient examined and medical record reviewed,agree with above note. Tharon Aquas Trigt III 08/30/2017

## 2017-08-30 NOTE — Addendum Note (Signed)
Addendum  created 08/30/17 1503 by Kipp BroodJoslin, Zenola Dezarn, MD   Sign clinical note

## 2017-08-30 NOTE — Procedures (Signed)
Extubation Procedure Note  Patient Details:   Name: Reginald [name] DOB: 04/22/1961 MRN: [id number]   Airway Documentation:     Evaluation  O2 sats: stable throughout Complications: No apparent complications Patient did tolerate procedure well. Bilateral Breath Sounds: Rhonchi   Yes   Patient extubated to 4lnc. Patient tolerated well. No complications. Vital signs stable at this time. RN at bedside. RT will continue to monitor.   Ave Filterdkins, Shanik [place] Williams 08/30/2017, 9:20 AM

## 2017-08-30 NOTE — Progress Notes (Signed)
Peripherally Inserted Central Catheter/Midline Placement  The IV Nurse has discussed with the patient and/or persons authorized to consent for the patient, the purpose of this procedure and the potential benefits and risks involved with this procedure.  The benefits include less needle sticks, lab draws from the catheter, and the patient may be discharged home with the catheter. Risks include, but not limited to, infection, bleeding, blood clot (thrombus formation), and puncture of an artery; nerve damage and irregular heartbeat and possibility to perform a PICC exchange if needed/ordered by physician.  Alternatives to this procedure were also discussed.  Bard Power PICC patient education guide, fact sheet on infection prevention and patient information card has been provided to patient /or left at bedside.    PICC/Midline Placement Documentation  PICC Double Lumen 08/30/17 PICC Left Basilic 50 cm 0 cm (Active)  Indication for Insertion or Continuance of Line Poor Vasculature-patient has had multiple peripheral attempts or PIVs lasting less than 24 hours 08/30/2017 10:05 PM  Exposed Catheter (cm) 0 cm 08/30/2017 10:05 PM  Site Assessment Clean;Dry;Intact 08/30/2017 10:05 PM  Lumen #1 Status Blood return noted;Flushed;Saline locked 08/30/2017 10:05 PM  Lumen #2 Status Blood return noted;Flushed;Saline locked 08/30/2017 10:05 PM  Dressing Type Transparent 08/30/2017 10:05 PM  Dressing Status Clean;Dry;Intact;Antimicrobial disc in place 08/30/2017 10:05 PM  Dressing Intervention New dressing 08/30/2017 10:05 PM  Dressing Change Due 09/06/17 08/30/2017 10:05 PM       Netta Corriganhomas, Haile Toppins L 08/30/2017, 10:20 PM

## 2017-08-30 NOTE — Progress Notes (Signed)
      [address]       [place] [id number]             [phone]      POD # 1 MVR  Up in chair, no complaints  BP 136/75   Pulse 89   Temp 98.8 F (37.1 C)   Resp (!) 24   Ht 5\' 9"  (1.753 m)   Wt 238 lb 15.7 oz (108.4 kg)   SpO2 95%   BMI 35.29 kg/m   Intake/Output Summary (Last 24 hours) at 08/30/2017 1836 Last data filed at 08/30/2017 1800 Gross per 24 hour  Intake 4223.66 ml  Output 3815 ml  Net 408.66 ml   Hct= 26, creatinine 0.8  Doing well POD # 1  Xiamara Hulet C. Dorris FetchHendrickson, MD [facility] [phone]

## 2017-08-30 NOTE — Progress Notes (Signed)
Anesthesiology Follow-up:  Awake and alert, having some incisional soreness. Hemodynamically stable on low dose milrinone.   VS: T-36.8 BP- 129/76 HR- 90 (a-paced) RR- 21 O2 sat 98% on 4L O2 PA 42/20 CO/CI- 7.5/3.4  CXR- mild cardiomegaly with bilateral interstitial edema  Extubated at 09:20 this morning  Na-131 K-4.7 BUN/CR-14/0.79 glucose-119 WBC- 8,700 H/H- 7.4/24.1 platelets- 8189,53000  57 year old male 1 day S/P mitral valve replacement for endocarditis. Stable post-op course undergoing diuresis for mild fluid overload.  Reginald [name]

## 2017-08-30 NOTE — Progress Notes (Signed)
Patient ID: Reginald [name], male   DOB: 10-13-1960, 57 y.o.   MRN: [id number]     Advanced Heart Failure Rounding Note  HF Cardiology: Aundra Dubin  Subjective:    MV replacement with bioprosthetic valve 08/29/17.   This morning, he is intubated on phenylephrine 30, milrinone 0.25, epinephrine 2.  Co-ox 60% this morning.  He is awake and alert.  Junctional bradycardia with interspersed sinus rhythm earlier this morning, now a-paced at 90.    CXR shows pulmonary edema.   Swan numbers:  PA 44/19 CI 3.4  MRI spine 08/24/17 with continued evidence of Osteomyelitis and joint infection in C1-C2, but positive response to treatment. Less regional edema and diminishing paraspinal fluid collections.  Less edema at C4-C5 level.  Objective:   Weight Range: 238 lb 15.7 oz (108.4 kg) Body mass index is 35.29 kg/m.   Vital Signs:   Temp:  [98.1 F (36.7 C)-100 F (37.8 C)] 99.3 F (37.4 C) (01/09 0715) Pulse Rate:  [88-97] 90 (01/09 0757) Resp:  [11-29] 25 (01/09 0757) BP: (83-170)/(55-74) 83/68 (01/09 0757) SpO2:  [87 %-100 %] 99 % (01/09 0715) Arterial Line BP: (81-138)/(41-64) 91/63 (01/09 0715) FiO2 (%):  [50 %-100 %] 50 % (01/09 0758) Weight:  [238 lb 15.7 oz (108.4 kg)] 238 lb 15.7 oz (108.4 kg) (01/09 0414) Last BM Date: 08/27/17  Weight change: Filed Weights   08/28/17 0405 08/29/17 0511 08/30/17 0414  Weight: 233 lb 6.4 oz (105.9 kg) 231 lb (104.8 kg) 238 lb 15.7 oz (108.4 kg)    Intake/Output:   Intake/Output Summary (Last 24 hours) at 08/30/2017 0911 Last data filed at 08/30/2017 0845 Gross per 24 hour  Intake 7000.2 ml  Output 4730 ml  Net 2270.2 ml    Physical Exam    General: Intubated, awake/alert.  Neck: No JVD, no thyromegaly or thyroid nodule.  Lungs: Decreased breath sounds at bases.  CV: Nondisplaced PMI.  Heart regular S1/S2, no S3/S4, no murmur.  No peripheral edema.   Abdomen: Soft, nontender, no hepatosplenomegaly, no distention.  Skin: Intact without lesions  or rashes.  Neurologic: Alert/follows commands.  Extremities: No clubbing or cyanosis.  HEENT: Normal.    Telemetry   Junctional brady => a-paced at 90 (no atrial fibrillation).   Labs    CBC Recent Labs    08/28/17 0807 08/29/17 0534  08/29/17 1945 08/29/17 1956 08/30/17 0345  WBC 9.6 10.7*   < > 11.5*  --  8.7  NEUTROABS 6.6 6.8  --   --   --   --   HGB 9.0* 8.4*   < > 7.6* 8.2* 7.4*  HCT 29.0* 27.3*   < > 24.7* 24.0* 24.1*  MCV 88.4 88.9   < > 86.4  --  86.7  PLT 311 332   < > 191  --  189   < > = values in this interval not displayed.   Basic Metabolic Panel Recent Labs    08/28/17 0807 08/29/17 0534  08/29/17 1945 08/29/17 1956 08/30/17 0345  NA 128* 127*   < >  --  134* 131*  K 4.9 5.2*   < >  --  4.8 4.7  CL 92* 91*  --   --  96* 97*  CO2 29 28  --   --   --  27  GLUCOSE 130* 137*   < >  --  129* 100*  BUN 15 18  --   --  16 14  CREATININE 0.70 0.90  --  0.93 0.80 0.79  CALCIUM 8.9 8.8*  --   --   --  8.4*  MG 1.7 2.0  --  2.5*  --  2.3  PHOS 5.0* 4.9*  --   --   --   --    < > = values in this interval not displayed.   Liver Function Tests Recent Labs    08/28/17 0807 08/29/17 0534  AST 20 19  ALT 22 21  ALKPHOS 177* 198*  BILITOT 1.0 0.9  PROT 8.4* 8.0  ALBUMIN 2.4* 2.5*   No results for input(s): LIPASE, AMYLASE in the last 72 hours. Cardiac Enzymes Recent Labs    08/28/17 0807  CKTOTAL 14*    BNP: BNP (last 3 results) Recent Labs    07/26/17 0340 08/19/17 2300  BNP 104.6* 204.8*    ProBNP (last 3 results) No results for input(s): PROBNP in the last 8760 hours.   D-Dimer No results for input(s): DDIMER in the last 72 hours. Hemoglobin A1C No results for input(s): HGBA1C in the last 72 hours. Fasting Lipid Panel No results for input(s): CHOL, HDL, LDLCALC, TRIG, CHOLHDL, LDLDIRECT in the last 72 hours. Thyroid Function Tests No results for input(s): TSH, T4TOTAL, T3FREE, THYROIDAB in the last 72 hours.  Invalid  input(s): FREET3  Other results:   Imaging    Dg Chest Port 1 View  Result Date: 08/29/2017 CLINICAL DATA:  Intubation. EXAM: PORTABLE CHEST 1 VIEW COMPARISON:  08/27/2017. FINDINGS: Endotracheal tube tip noted 4.5 cm above the carina. Swan-Ganz catheter noted with tip over the pulmonary outflow tract. Bilateral chest tubes are noted. Prior cardiac pacer. Cardiomegaly with pulmonary vascular prominence and bilateral pulmonary infiltrates suggesting pulmonary edema. Low lung volumes with mild basilar atelectasis. No prominent pleural effusion. No pneumothorax IMPRESSION: 1. Lines and tubes in position as above. Bilateral chest tubes are present. No pneumothorax. 2. Prior cardiac valve replacement. Cardiomegaly with bilateral pulmonary infiltrates suggesting pulmonary edema. Low lung volumes with basilar atelectasis. Electronically Signed   By: Marcello Moores  Register   On: 08/29/2017 14:06     Medications:     Scheduled Medications: . acetaminophen  1,000 mg Oral Q6H   Or  . acetaminophen (TYLENOL) oral liquid 160 mg/5 mL  1,000 mg Per Tube Q6H  . aspirin EC  325 mg Oral Daily   Or  . aspirin  324 mg Per Tube Daily  . bisacodyl  10 mg Oral Daily   Or  . bisacodyl  10 mg Rectal Daily  . chlorhexidine gluconate (MEDLINE KIT)  15 mL Mouth Rinse BID  . docusate sodium  200 mg Oral Daily  . furosemide  40 mg Intravenous BID  . insulin aspart  0-24 Units Subcutaneous Q4H  . insulin detemir  15 Units Subcutaneous BID  . levalbuterol  1.25 mg Nebulization Q6H  . mouth rinse  15 mL Mouth Rinse 10 times per day  . metoCLOPramide (REGLAN) injection  10 mg Intravenous Q6H  . mometasone-formoterol  2 puff Inhalation BID  . [START ON 08/31/2017] pantoprazole  40 mg Oral Daily  . sodium chloride flush  3 mL Intravenous Q12H  . warfarin  2 mg Oral q1800  . Warfarin - Physician Dosing Inpatient   Does not apply q1800    Infusions: . sodium chloride 20 mL/hr at 08/30/17 0700  . sodium chloride    .  sodium chloride 20 mL/hr at 08/30/17 0700  . sodium chloride    . albumin human Stopped (08/29/17 2044)  . cefUROXime (  ZINACEF)  IV Stopped (08/30/17 0420)  . DAPTOmycin (CUBICIN)  IV Stopped (08/28/17 1806)  . dexmedetomidine (PRECEDEX) IV infusion Stopped (08/30/17 0910)  . EPINEPHrine 4 mg in dextrose 5% 250 mL infusion (16 mcg/mL) 2 mcg/min (08/30/17 0700)  . insulin (NOVOLIN-R) infusion 4.2 Units/hr (08/30/17 0902)  . lactated ringers Stopped (08/29/17 1400)  . lactated ringers 20 mL/hr at 08/30/17 0700  . milrinone 0.25 mcg/kg/min (08/30/17 0700)  . nitroGLYCERIN Stopped (08/29/17 1400)  . norepinephrine (LEVOPHED) Adult infusion Stopped (08/29/17 1500)  . phenylephrine (NEO-SYNEPHRINE) Adult infusion 30 mcg/min (08/30/17 0909)    PRN Medications: sodium chloride, albumin human, fentaNYL (SUBLIMAZE) injection, metoprolol tartrate, midazolam, ondansetron (ZOFRAN) IV, oxyCODONE, sodium chloride flush, traMADol    Patient Profile   Garyn Arlotta is a 57 y.o. 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   Admitted back to hospital from [facility] 08/20/17 with resp distress and edema  Bioprosthetic MVR 08/29/17.   Assessment/Plan   1. Acute on chronic diastolic CHF: Now s/p MV replacement.  He is volume overloaded with pulmonary edema post-op.  Co-ox adequate at 60%.  SBP in 120s on current pressor support.  - Lasix 40 mg IV bid today.  - Wean phenylephrine off this morning, can then work on weaning norepinephrine.  He will remain on milrinone 0.25 for now, wean after pressors off.    2. 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  Last blood cultures 12/25 NGTD. He is on daptomycin.  Now s/p MV replacement with bioprosthetic valve.   3. Anemia: Suspect anemia of chronic disease, got 1 unit PRBCs 12/31. Transfused again this morning.  - FOBT was recently negative.  4. DVT: Left PT vein (below knee).  He is starting on warfarin.  5. Rhythm: Junctional rhythm post-op, no atrial fibrillation.  Now a-paced at night.  Will need to check daily for underlying rhythm.   Loralie Champagne 08/30/2017

## 2017-08-31 ENCOUNTER — Inpatient Hospital Stay (HOSPITAL_COMMUNITY): Payer: Medicaid Other

## 2017-08-31 DIAGNOSIS — Z952 Presence of prosthetic heart valve: Secondary | ICD-10-CM

## 2017-08-31 LAB — PROTIME-INR
INR: 1.34
Prothrombin Time: 16.5 seconds — ABNORMAL HIGH (ref 11.4–15.2)

## 2017-08-31 LAB — GLUCOSE, CAPILLARY
Glucose-Capillary: 107 mg/dL — ABNORMAL HIGH (ref 65–99)
Glucose-Capillary: 108 mg/dL — ABNORMAL HIGH (ref 65–99)
Glucose-Capillary: 121 mg/dL — ABNORMAL HIGH (ref 65–99)
Glucose-Capillary: 124 mg/dL — ABNORMAL HIGH (ref 65–99)
Glucose-Capillary: 156 mg/dL — ABNORMAL HIGH (ref 65–99)
Glucose-Capillary: 167 mg/dL — ABNORMAL HIGH (ref 65–99)

## 2017-08-31 LAB — COOXEMETRY PANEL
Carboxyhemoglobin: 2.4 % — ABNORMAL HIGH (ref 0.5–1.5)
Methemoglobin: 1.2 % (ref 0.0–1.5)
O2 Saturation: 63.6 %
Total hemoglobin: 8.5 g/dL — ABNORMAL LOW (ref 12.0–16.0)

## 2017-08-31 LAB — BASIC METABOLIC PANEL
Anion gap: 7 (ref 5–15)
BUN: 16 mg/dL (ref 6–20)
CO2: 30 mmol/L (ref 22–32)
Calcium: 8.6 mg/dL — ABNORMAL LOW (ref 8.9–10.3)
Chloride: 95 mmol/L — ABNORMAL LOW (ref 101–111)
Creatinine, Ser: 0.83 mg/dL (ref 0.61–1.24)
GFR calc Af Amer: >60 mL/min (ref >60)
GFR calc non Af Amer: >60 mL/min (ref >60)
Glucose, Bld: 116 mg/dL — ABNORMAL HIGH (ref 65–99)
Potassium: 4.6 mmol/L (ref 3.5–5.1)
Sodium: 132 mmol/L — ABNORMAL LOW (ref 135–145)

## 2017-08-31 LAB — CBC
HCT: 27 % — ABNORMAL LOW (ref 39.0–52.0)
Hemoglobin: 8.3 g/dL — ABNORMAL LOW (ref 13.0–17.0)
MCH: 27.1 pg (ref 26.0–34.0)
MCHC: 30.7 g/dL (ref 30.0–36.0)
MCV: 88.2 fL (ref 78.0–100.0)
Platelets: 178 10*3/uL (ref 150–400)
RBC: 3.06 MIL/uL — ABNORMAL LOW (ref 4.22–5.81)
RDW: 16.7 % — ABNORMAL HIGH (ref 11.5–15.5)
WBC: 8 10*3/uL (ref 4.0–10.5)

## 2017-08-31 LAB — POCT I-STAT, CHEM 8
BUN: 22 mg/dL — ABNORMAL HIGH (ref 6–20)
Calcium, Ion: 1.19 mmol/L (ref 1.15–1.40)
Chloride: 92 mmol/L — ABNORMAL LOW (ref 101–111)
Creatinine, Ser: 1.1 mg/dL (ref 0.61–1.24)
Glucose, Bld: 154 mg/dL — ABNORMAL HIGH (ref 65–99)
HCT: 27 % — ABNORMAL LOW (ref 39.0–52.0)
Hemoglobin: 9.2 g/dL — ABNORMAL LOW (ref 13.0–17.0)
Potassium: 4.1 mmol/L (ref 3.5–5.1)
Sodium: 134 mmol/L — ABNORMAL LOW (ref 135–145)
TCO2: 29 mmol/L (ref 22–32)

## 2017-08-31 MED ORDER — CARVEDILOL 6.25 MG PO TABS
6.2500 mg | ORAL_TABLET | Freq: Two times a day (BID) | ORAL | Status: DC
  Administered 2017-08-31 – 2017-09-01 (×3): 6.25 mg via ORAL
  Filled 2017-08-31 (×3): qty 1

## 2017-08-31 MED ORDER — FUROSEMIDE 10 MG/ML IJ SOLN
60.0000 mg | Freq: Two times a day (BID) | INTRAMUSCULAR | Status: DC
  Administered 2017-08-31: 60 mg via INTRAVENOUS
  Filled 2017-08-31: qty 6

## 2017-08-31 MED ORDER — FENTANYL CITRATE (PF) 100 MCG/2ML IJ SOLN
50.0000 ug | INTRAMUSCULAR | Status: DC | PRN
Start: 2017-08-31 — End: 2017-09-01
  Administered 2017-08-31: 50 ug via INTRAVENOUS
  Filled 2017-08-31: qty 2

## 2017-08-31 MED ORDER — WARFARIN SODIUM 5 MG PO TABS
5.0000 mg | ORAL_TABLET | Freq: Every day | ORAL | Status: AC
  Administered 2017-08-31: 5 mg via ORAL
  Filled 2017-08-31: qty 1

## 2017-08-31 MED ORDER — LEVALBUTEROL HCL 1.25 MG/0.5ML IN NEBU: 1.2500 mg | INHALATION_SOLUTION | Freq: Four times a day (QID) | RESPIRATORY_TRACT | Status: DC | PRN

## 2017-08-31 NOTE — Progress Notes (Signed)
Physical Therapy Treatment Patient Details Name: Reginald [name] MRN: [id number] DOB: 02/12/1961 Today's Date: 08/31/2017    History of Present Illness [name] a 57 y.o.malewith PMH significant ofspinal osteomyelitis, alcohol abuse, DMII, paraspinal abscess, tobacco abuse, nonobstructive CAD, mitral regurgitation whohad very complicated admissionfrom 07/25/2017 to 08/10/2017 due to mitral valve large vegetation/endocarditis, possible aortic valve endocarditis, septic emboli to the brain, multifocal pneumonia, C1 osteomyelitis, C4 discitis and paraspinal abscess due to MRSA bacteremia.Admitted back to hospital from [facility] 08/20/17 with resp distress and edema. Acute DVT 1/7. S/p MVR 08/29/17; intub 1/8 for sx, with successful extub 1/9.   PT Comments    Pt progressing with mobility s/p MVR 08/29/17. C/o fatigue after walking this morning, but able to ambulate an additional 220' with Eva walker and intermittent min guard for balance. Practiced technique for performing sit<>stands, including lower surface such as toilet while maintaining sternal precautions. Fiance present throughout session and very supportive. Will continue to follow acutely.     Follow Up Recommendations  Home health PT;Supervision/Assistance - 24 hour     Equipment Recommendations  Rolling walker with 5" wheels    Recommendations for Other Services       Precautions / Restrictions Precautions Precautions: Fall;Sternal Restrictions Other Position/Activity Restrictions: Sternal    Mobility  Bed Mobility               General bed mobility comments: Received sitting on toilet. Returned to recliner at end of session  Transfers Overall transfer level: Needs assistance Equipment used: None Transfers: Sit to/from Stand Sit to Stand: Min assist         General transfer comment: Max cues for proper positioning for standing from toilet, including bilat hands on knees. Pt eventually requesting to hug heart pillow,  requiring minA for trunk elevation  Ambulation/Gait Ambulation/Gait assistance: Min guard Ambulation Distance (Feet): 220 Feet Assistive device: 4-wheeled walker(Eva walker) Gait Pattern/deviations: Step-through pattern;Decreased stride length;Trunk flexed Gait velocity: Decreased Gait velocity interpretation: <1.8 ft/sec, indicative of risk for recurrent falls General Gait Details: Slow, controlled amb with Eva walker and intermittent min guard for balance. Max encouragement to increase distance as pt c/o fatigue. SpO2 >90% on 3L O2 [place]   Stairs            Wheelchair Mobility    Modified Rankin (Stroke Patients Only)       Balance Overall balance assessment: Needs assistance Sitting-balance support: Feet supported;No upper extremity supported Sitting balance-Leahy Scale: Good       Standing balance-Leahy Scale: Fair Standing balance comment: Can static stand with no UE support and close min guard; reliant on UE support for dynamic stability                            Cognition Arousal/Alertness: Awake/alert Behavior During Therapy: WFL for tasks assessed/performed Overall Cognitive Status: Impaired/Different from baseline Area of Impairment: Problem solving;Attention                   Current Attention Level: Selective         Problem Solving: Slow processing;Requires verbal cues General Comments: Some increased time for processing and decreased attention this session.       Exercises      General Comments General comments (skin integrity, edema, etc.): Fiance present during session      Pertinent Vitals/Pain Pain Assessment: Faces Faces Pain Scale: Hurts little more Pain Location: Sternal incision Pain Descriptors / Indicators: Sore;Discomfort Pain Intervention(s): Monitored  during session;Limited activity within patient's tolerance    Home Living                      Prior Function            PT Goals (current  goals can now be found in the care plan section) Acute Rehab PT Goals Patient Stated Goal: go home PT Goal Formulation: With patient Time For Goal Achievement: 09/14/17 Potential to Achieve Goals: Good Progress towards PT goals: Progressing toward goals    Frequency    Min 3X/week      PT Plan Current plan remains appropriate    Co-evaluation              AM-PAC PT "6 Clicks" Daily Activity  Outcome Measure  Difficulty turning over in bed (including adjusting bedclothes, sheets and blankets)?: None Difficulty moving from lying on back to sitting on the side of the bed? : A Little Difficulty sitting down on and standing up from a chair with arms (e.g., wheelchair, bedside commode, etc,.)?: Unable Help needed moving to and from a bed to chair (including a wheelchair)?: A Little Help needed walking in hospital room?: A Little Help needed climbing 3-5 steps with a railing? : A Little 6 Click Score: 17    End of Session Equipment Utilized During Treatment: Gait belt;Oxygen Activity Tolerance: Patient limited by fatigue Patient left: in chair;with call bell/phone within reach;with family/visitor present Nurse Communication: Mobility status PT Visit Diagnosis: Unsteadiness on feet (R26.81);Other abnormalities of gait and mobility (R26.89);Muscle weakness (generalized) (M62.81)     Time: 1610-9604 PT Time Calculation (min) (ACUTE ONLY): 23 min  Charges:  $Gait Training: 8-22 mins $Therapeutic Activity: 8-22 mins                    G Codes:      Reginald [name], PT, DPT Acute Rehab Services  Pager: [phone]  Malachy Chamber 08/31/2017, 2:50 PM

## 2017-08-31 NOTE — Progress Notes (Addendum)
[facility] DAILY ICU PROGRESS NOTE                   [address]            [place] [address]          [phone]   2 Days Post-Op Procedure(s) (LRB): MITRAL VALVE (MV) REPLACEMENT (N/A) TRANSESOPHAGEAL ECHOCARDIOGRAM (TEE) (N/A)  Total Length of Stay:  LOS: 11 days   Subjective: conts to progress nicely, slept well, no specific c/o  Objective: Vital signs in last 24 hours: Temp:  [97.9 F (36.6 C)-99.5 F (37.5 C)] 97.9 F (36.6 C) (01/10 0418) Pulse Rate:  [87-91] 88 (01/10 0700) Cardiac Rhythm: Atrial paced (01/10 0400) Resp:  [17-33] 21 (01/10 0700) BP: (83-158)/(63-115) 137/87 (01/10 0700) SpO2:  [89 %-100 %] 99 % (01/10 0700) Arterial Line BP: (81-199)/(33-105) 126/33 (01/09 1445) FiO2 (%):  [50 %] 50 % (01/09 0800) Weight:  [239 lb 6.7 oz (108.6 kg)] 239 lb 6.7 oz (108.6 kg) (01/10 0500)  Filed Weights   08/29/17 0511 08/30/17 0414 08/31/17 0500  Weight: 231 lb (104.8 kg) 238 lb 15.7 oz (108.4 kg) 239 lb 6.7 oz (108.6 kg)    Weight change: 7.1 oz (0.2 kg)   Hemodynamic parameters for last 24 hours: PAP: (36-59)/(8-33) 39/17 CVP:  [3 mmHg-17 mmHg] 15 mmHg CO:  [7.4 L/min-7.5 L/min] 7.5 L/min CI:  [3.4 L/min/m2] 3.4 L/min/m2  Intake/Output from previous day: 01/09 0701 - 01/10 0700 In: 1540 [P.O.:480; I.V.:745; Blood:315] Out: 2930 [Urine:2370; Chest Tube:560]  Intake/Output this shift: No intake/output data recorded.  Current Meds: Scheduled Meds: . acetaminophen  1,000 mg Oral Q6H   Or  . acetaminophen (TYLENOL) oral liquid 160 mg/5 mL  1,000 mg Per Tube Q6H  . aspirin EC  325 mg Oral Daily   Or  . aspirin  324 mg Per Tube Daily  . bisacodyl  10 mg Oral Daily   Or  . bisacodyl  10 mg Rectal Daily  . Chlorhexidine Gluconate Cloth  6 each Topical Daily  . docusate sodium  200 mg Oral Daily  . furosemide  40 mg Intravenous BID  . insulin aspart  0-24 Units Subcutaneous Q4H  . insulin detemir  15 Units Subcutaneous BID  .  metoCLOPramide (REGLAN) injection  10 mg Intravenous Q6H  . mometasone-formoterol  2 puff Inhalation BID  . pantoprazole  40 mg Oral Daily  . sodium chloride flush  10-40 mL Intracatheter Q12H  . sodium chloride flush  3 mL Intravenous Q12H  . Warfarin - Physician Dosing Inpatient   Does not apply q1800   Continuous Infusions: . sodium chloride Stopped (08/30/17 1453)  . sodium chloride Stopped (08/30/17 0700)  . sodium chloride Stopped (08/30/17 1453)  . sodium chloride    . DAPTOmycin (CUBICIN)  IV Stopped (08/30/17 1833)  . dexmedetomidine (PRECEDEX) IV infusion Stopped (08/30/17 0910)  . EPINEPHrine 4 mg in dextrose 5% 250 mL infusion (16 mcg/mL) Stopped (08/30/17 1403)  . insulin (NOVOLIN-R) infusion Stopped (08/30/17 1230)  . lactated ringers Stopped (08/29/17 1400)  . lactated ringers 10 mL/hr at 08/30/17 1453  . milrinone 0.125 mcg/kg/min (08/30/17 2317)  . nitroGLYCERIN Stopped (08/29/17 1400)  . norepinephrine (LEVOPHED) Adult infusion Stopped (08/29/17 1500)  . phenylephrine (NEO-SYNEPHRINE) Adult infusion Stopped (08/30/17 1018)   PRN Meds:.sodium chloride, fentaNYL (SUBLIMAZE) injection, levalbuterol, metoprolol tartrate, midazolam, ondansetron (ZOFRAN) IV, oxyCODONE, sodium chloride flush, sodium chloride flush, sodium chloride flush, traMADol  General appearance: alert, cooperative and no  distress Heart: regular rate and rhythm and no murmur or rub Lungs: dim and slightly coarse in bases Abdomen: benign Extremities: minor edema Wound: dressings intact  Lab Results: CBC: Recent Labs    08/30/17 1618 08/30/17 1632 08/31/17 0442  WBC 7.5  --  8.0  HGB 8.6* 8.8* 8.3*  HCT 27.0* 26.0* 27.0*  PLT 176  --  178   BMET:  Recent Labs    08/30/17 0345  08/30/17 1632 08/31/17 0442  NA 131*  --  132* 132*  K 4.7  --  4.8 4.6  CL 97*  --  93* 95*  CO2 27  --   --  30  GLUCOSE 100*  --  137* 116*  BUN 14  --  14 16  CREATININE 0.79   < > 0.80 0.83  CALCIUM  8.4*  --   --  8.6*   < > = values in this interval not displayed.    CMET: Lab Results  Component Value Date   WBC 8.0 08/31/2017   HGB 8.3 (L) 08/31/2017   HCT 27.0 (L) 08/31/2017   PLT 178 08/31/2017   GLUCOSE 116 (H) 08/31/2017   TRIG 290 (H) 08/08/2017   ALT 21 08/29/2017   AST 19 08/29/2017   NA 132 (L) 08/31/2017   K 4.6 08/31/2017   CL 95 (L) 08/31/2017   CREATININE 0.83 08/31/2017   BUN 16 08/31/2017   CO2 30 08/31/2017   TSH 2.141 08/21/2017   INR 1.34 08/31/2017   HGBA1C 5.8 (H) 08/21/2017      PT/INR:  Recent Labs    08/31/17 0442  LABPROT 16.5*  INR 1.34   Radiology: No results found.   Assessment/Plan: S/P Procedure(s) (LRB): MITRAL VALVE (MV) REPLACEMENT (N/A) TRANSESOPHAGEAL ECHOCARDIOGRAM (TEE) (N/A)  1 excellent progress overall 2 hemodyn stable, Sinus in 60-70's under pacer- may be able to stop temp a pacing Co-OX is 63 on low dose milrinone, should be able to wean off today 3 volume overload- will need to cont to diurese, creat s normal, mild hypochloremia/ hyponatremia- cont to monitor 4 ABL anemia is stable 5 coumadin short term for MVR 6 mod CT drainage- monitor- d/c tube soon 7 glucose control is good 8 routine pulm toilet and rehab  Reginald [name] 08/31/2017 7:44 AM   Progressing well 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 Reginald [name] 08/31/2017

## 2017-08-31 NOTE — Progress Notes (Signed)
Patient ID: Reginald [name], male   DOB: 04/03/1961, 57 y.o.   MRN: [id number] [facility] Evening Rounds:  Hemodynamically stable  Atrial paced 80  Urine output ok  Sleeping.

## 2017-08-31 NOTE — Progress Notes (Signed)
Patient ID: Reichen Hutzler, male   DOB: 1961/08/08, 57 y.o.   MRN: [id number]     Advanced Heart Failure Rounding Note  HF Cardiology: Shirlee Latch  Subjective:    MV replacement with bioprosthetic valve 08/29/17.   Remains on milrinone 0.125 mcg. CO-OX 64%.  Feeling better. Denies SOB.   MRI spine 08/24/17 with continued evidence of Osteomyelitis and joint infection in C1-C2, but positive response to treatment. Less regional edema and diminishing paraspinal fluid collections.  Less edema at C4-C5 level.  Objective:   Weight Range: 239 lb 6.7 oz (108.6 kg) Body mass index is 35.36 kg/m.   Vital Signs:   Temp:  [97.9 F (36.6 C)-99.1 F (37.3 C)] 98.5 F (36.9 C) (01/10 0801) Pulse Rate:  [78-91] 78 (01/10 0801) Resp:  [17-33] 23 (01/10 0801) BP: (110-158)/(63-115) 147/74 (01/10 0801) SpO2:  [89 %-100 %] 95 % (01/10 0857) Arterial Line BP: (81-199)/(33-105) 126/33 (01/09 1445) Weight:  [239 lb 6.7 oz (108.6 kg)] 239 lb 6.7 oz (108.6 kg) (01/10 0500) Last BM Date: 08/27/17  Weight change: Filed Weights   08/29/17 0511 08/30/17 0414 08/31/17 0500  Weight: 231 lb (104.8 kg) 238 lb 15.7 oz (108.4 kg) 239 lb 6.7 oz (108.6 kg)    Intake/Output:   Intake/Output Summary (Last 24 hours) at 08/31/2017 0926 Last data filed at 08/31/2017 0806 Gross per 24 hour  Intake 1488.51 ml  Output 2510 ml  Net -1021.49 ml    Physical Exam   CVP 11-12  General:   No resp difficulty. In the chair.  HEENT: normal Neck: supple. JVP 10-11. Carotids 2+ bilat; no bruits. No lymphadenopathy or thryomegaly appreciated. Cor: PMI nondisplaced. Regular rate & rhythm. No rubs, gallops or murmurs. Sternal dressing in tact.  Lungs: clear on 4 liters.  Abdomen: soft, nontender, nondistended. No hepatosplenomegaly. No bruits or masses. Good bowel sounds. Extremities: no cyanosis, clubbing, rash, edema Neuro: alert & orientedx3, cranial nerves grossly intact. moves all 4 extremities w/o difficulty. Affect  pleasant   Telemetry   A paced 80s   Labs    CBC Recent Labs    08/29/17 0534  08/30/17 1618 08/30/17 1632 08/31/17 0442  WBC 10.7*   < > 7.5  --  8.0  NEUTROABS 6.8  --   --   --   --   HGB 8.4*   < > 8.6* 8.8* 8.3*  HCT 27.3*   < > 27.0* 26.0* 27.0*  MCV 88.9   < > 88.2  --  88.2  PLT 332   < > 176  --  178   < > = values in this interval not displayed.   Basic Metabolic Panel Recent Labs    40/98/11 0534  08/30/17 0345 08/30/17 1618 08/30/17 1632 08/31/17 0442  NA 127*   < > 131*  --  132* 132*  K 5.2*   < > 4.7  --  4.8 4.6  CL 91*   < > 97*  --  93* 95*  CO2 28  --  27  --   --  30  GLUCOSE 137*   < > 100*  --  137* 116*  BUN 18   < > 14  --  14 16  CREATININE 0.90   < > 0.79 0.84 0.80 0.83  CALCIUM 8.8*  --  8.4*  --   --  8.6*  MG 2.0   < > 2.3 2.0  --   --   PHOS 4.9*  --   --   --   --   --    < > =  values in this interval not displayed.   Liver Function Tests Recent Labs    08/29/17 0534  AST 19  ALT 21  ALKPHOS 198*  BILITOT 0.9  PROT 8.0  ALBUMIN 2.5*   No results for input(s): LIPASE, AMYLASE in the last 72 hours. Cardiac Enzymes No results for input(s): CKTOTAL, CKMB, CKMBINDEX, TROPONINI in the last 72 hours.  BNP: BNP (last 3 results) Recent Labs    07/26/17 0340 08/19/17 2300  BNP 104.6* 204.8*    ProBNP (last 3 results) No results for input(s): PROBNP in the last 8760 hours.   D-Dimer No results for input(s): DDIMER in the last 72 hours. Hemoglobin A1C No results for input(s): HGBA1C in the last 72 hours. Fasting Lipid Panel No results for input(s): CHOL, HDL, LDLCALC, TRIG, CHOLHDL, LDLDIRECT in the last 72 hours. Thyroid Function Tests No results for input(s): TSH, T4TOTAL, T3FREE, THYROIDAB in the last 72 hours.  Invalid input(s): FREET3  Other results:   Imaging    Dg Chest Port 1 View  Result Date: 08/31/2017 CLINICAL DATA:  Post mitral valve replacement EXAM: PORTABLE CHEST 1 VIEW COMPARISON:   08/30/2017 FINDINGS: Endotracheal tube has been removed. Interval placement of left PICC line with the tip at the cavoatrial junction. Bilateral chest tubes remain in place without pneumothorax. Interval removal of Swan-Ganz catheter. Cardiomegaly with vascular congestion with continued interstitial prominence, likely interstitial edema, slightly improved. Bibasilar atelectasis and small effusions noted, stable. IMPRESSION: Interval extubation. Continued mild interstitial edema, slightly improved since prior study. Bibasilar atelectasis and small effusions are unchanged. No pneumothorax. Electronically Signed   By: Charlett NoseKevin  Dover M.D.   On: 08/31/2017 08:30     Medications:     Scheduled Medications: . acetaminophen  1,000 mg Oral Q6H   Or  . acetaminophen (TYLENOL) oral liquid 160 mg/5 mL  1,000 mg Per Tube Q6H  . aspirin EC  325 mg Oral Daily   Or  . aspirin  324 mg Per Tube Daily  . bisacodyl  10 mg Oral Daily   Or  . bisacodyl  10 mg Rectal Daily  . carvedilol  6.25 mg Oral BID WC  . Chlorhexidine Gluconate Cloth  6 each Topical Daily  . docusate sodium  200 mg Oral Daily  . furosemide  40 mg Intravenous BID  . insulin aspart  0-24 Units Subcutaneous Q4H  . insulin detemir  15 Units Subcutaneous BID  . metoCLOPramide (REGLAN) injection  10 mg Intravenous Q6H  . mometasone-formoterol  2 puff Inhalation BID  . pantoprazole  40 mg Oral Daily  . sodium chloride flush  10-40 mL Intracatheter Q12H  . sodium chloride flush  3 mL Intravenous Q12H  . warfarin  5 mg Oral q1800  . Warfarin - Physician Dosing Inpatient   Does not apply q1800    Infusions: . sodium chloride Stopped (08/30/17 1453)  . sodium chloride Stopped (08/30/17 0700)  . sodium chloride Stopped (08/30/17 1453)  . sodium chloride    . DAPTOmycin (CUBICIN)  IV Stopped (08/30/17 1833)  . insulin (NOVOLIN-R) infusion Stopped (08/30/17 1230)  . lactated ringers 10 mL/hr at 08/30/17 1453  . milrinone 0.125 mcg/kg/min  (08/30/17 2317)  . phenylephrine (NEO-SYNEPHRINE) Adult infusion Stopped (08/30/17 1018)    PRN Medications: sodium chloride, fentaNYL (SUBLIMAZE) injection, levalbuterol, midazolam, ondansetron (ZOFRAN) IV, oxyCODONE, sodium chloride flush, sodium chloride flush, sodium chloride flush, traMADol    Patient Profile   [name] Pogue is a 57 y.o. 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   Assessment/Plan   1. Acute on chronic diastolic CHF: Now s/p MV replacement.  He is volume overloaded with pulmonary edema post-op.   - CO-OX stable 64%.  Stop milrinone. Repeat CO-OX in am.  - CVP 11-12. Increase lasix IV 60 mg twice a day.    2. 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  Last blood cultures 12/25 NGTD. He is on daptomycin.  Now s/p MV replacement with bioprosthetic valve.   3. Anemia: Suspect anemia of chronic disease, got 1 unit PRBCs 12/31 1/9 - FOBT was recently negative.  4. DVT: Left PT vein (below knee).  INR 1.34 On warfarin.  Pharmacy to follow up with Dr Maren Beach about DVT treatment.  5. Rhythm: Junctional rhythm post-op, no atrial fibrillation.  He is currently a-paced, underlying rhythm is NSR with 1st degree AVB rate around 60.   Marland Kitchen Amy Clegg NP-C  08/31/2017  Patient seen with NP, agree with the above note.  Co-ox 64% today with CVP 11-12.  - Stop milrinone.  - Lasix 60 mg IV bid.   I checked his underlying rhythm by turning down temporary pacemaker: He  is in NSR with 1st degree AVB, HR 50s-60s.  I reset to a-pacing at 80 bpm.   Marca Ancona 08/31/2017 10:19 AM

## 2017-09-01 ENCOUNTER — Inpatient Hospital Stay (HOSPITAL_COMMUNITY): Payer: Medicaid Other

## 2017-09-01 LAB — TYPE AND SCREEN
ABO/RH(D): O POS
Antibody Screen: NEGATIVE
Unit division: 0
Unit division: 0
Unit division: 0
Unit division: 0

## 2017-09-01 LAB — BASIC METABOLIC PANEL
Anion gap: 8 (ref 5–15)
BUN: 22 mg/dL — ABNORMAL HIGH (ref 6–20)
CO2: 30 mmol/L (ref 22–32)
Calcium: 8.6 mg/dL — ABNORMAL LOW (ref 8.9–10.3)
Chloride: 95 mmol/L — ABNORMAL LOW (ref 101–111)
Creatinine, Ser: 0.86 mg/dL (ref 0.61–1.24)
GFR calc Af Amer: >60 mL/min (ref >60)
GFR calc non Af Amer: >60 mL/min (ref >60)
Glucose, Bld: 70 mg/dL (ref 65–99)
Potassium: 3.4 mmol/L — ABNORMAL LOW (ref 3.5–5.1)
Sodium: 133 mmol/L — ABNORMAL LOW (ref 135–145)

## 2017-09-01 LAB — BPAM RBC
Blood Product Expiration Date: 201902012359
Blood Product Expiration Date: 201902022359
Blood Product Expiration Date: 201902022359
Blood Product Expiration Date: 201902022359
ISSUE DATE / TIME: 201901080843
ISSUE DATE / TIME: 201901080843
ISSUE DATE / TIME: 201901080843
ISSUE DATE / TIME: 201901090644
Unit Type and Rh: 5100
Unit Type and Rh: 5100
Unit Type and Rh: 5100
Unit Type and Rh: 5100

## 2017-09-01 LAB — GLUCOSE, CAPILLARY
Glucose-Capillary: 102 mg/dL — ABNORMAL HIGH (ref 65–99)
Glucose-Capillary: 152 mg/dL — ABNORMAL HIGH (ref 65–99)
Glucose-Capillary: 182 mg/dL — ABNORMAL HIGH (ref 65–99)
Glucose-Capillary: 71 mg/dL (ref 65–99)
Glucose-Capillary: 71 mg/dL (ref 65–99)
Glucose-Capillary: 96 mg/dL (ref 65–99)

## 2017-09-01 LAB — CULTURE, RESPIRATORY W GRAM STAIN
Culture: NORMAL
Special Requests: NORMAL

## 2017-09-01 LAB — CBC
HCT: 27.4 % — ABNORMAL LOW (ref 39.0–52.0)
Hemoglobin: 8.3 g/dL — ABNORMAL LOW (ref 13.0–17.0)
MCH: 27.1 pg (ref 26.0–34.0)
MCHC: 30.3 g/dL (ref 30.0–36.0)
MCV: 89.5 fL (ref 78.0–100.0)
Platelets: 189 10*3/uL (ref 150–400)
RBC: 3.06 MIL/uL — ABNORMAL LOW (ref 4.22–5.81)
RDW: 16.6 % — ABNORMAL HIGH (ref 11.5–15.5)
WBC: 7.3 10*3/uL (ref 4.0–10.5)

## 2017-09-01 LAB — COOXEMETRY PANEL
Carboxyhemoglobin: 2.2 % — ABNORMAL HIGH (ref 0.5–1.5)
Methemoglobin: 0.9 % (ref 0.0–1.5)
O2 Saturation: 60 %
Total hemoglobin: 8.4 g/dL — ABNORMAL LOW (ref 12.0–16.0)

## 2017-09-01 LAB — PROTIME-INR
INR: 1.36
Prothrombin Time: 16.6 seconds — ABNORMAL HIGH (ref 11.4–15.2)

## 2017-09-01 MED ORDER — SODIUM CHLORIDE 0.9% FLUSH: 3.0000 mL | INTRAVENOUS | Status: DC | PRN

## 2017-09-01 MED ORDER — OXYCODONE HCL 5 MG PO TABS
5.0000 mg | ORAL_TABLET | ORAL | Status: DC | PRN
  Administered 2017-09-01 – 2017-09-06 (×18): 5 mg via ORAL
  Filled 2017-09-01 (×18): qty 1

## 2017-09-01 MED ORDER — ENOXAPARIN SODIUM 40 MG/0.4ML ~~LOC~~ SOLN
40.0000 mg | SUBCUTANEOUS | Status: DC
  Administered 2017-09-01: 40 mg via SUBCUTANEOUS
  Filled 2017-09-01: qty 0.4

## 2017-09-01 MED ORDER — MAGNESIUM HYDROXIDE 400 MG/5ML PO SUSP: 30.0000 mL | Freq: Every day | ORAL | Status: DC | PRN

## 2017-09-01 MED ORDER — POTASSIUM CHLORIDE 10 MEQ/50ML IV SOLN: 10.0000 meq | INTRAVENOUS | Status: DC | PRN

## 2017-09-01 MED ORDER — FENTANYL CITRATE (PF) 100 MCG/2ML IJ SOLN
50.0000 ug | INTRAMUSCULAR | Status: AC | PRN
  Administered 2017-09-01: 50 ug via INTRAVENOUS
  Filled 2017-09-01: qty 2

## 2017-09-01 MED ORDER — POTASSIUM CHLORIDE CRYS ER 20 MEQ PO TBCR
40.0000 meq | EXTENDED_RELEASE_TABLET | Freq: Once | ORAL | Status: AC
  Administered 2017-09-01: 40 meq via ORAL
  Filled 2017-09-01: qty 2

## 2017-09-01 MED ORDER — CHLORHEXIDINE GLUCONATE 0.12 % MT SOLN
15.0000 mL | Freq: Two times a day (BID) | OROMUCOSAL | Status: DC
  Administered 2017-09-01 – 2017-09-06 (×10): 15 mL via OROMUCOSAL
  Filled 2017-09-01 (×9): qty 15

## 2017-09-01 MED ORDER — SODIUM CHLORIDE 0.9 % IV SOLN: 1000.0000 mg | INTRAVENOUS | Status: DC

## 2017-09-01 MED ORDER — SODIUM CHLORIDE 0.9 % IV SOLN: 250.0000 mL | INTRAVENOUS | Status: DC | PRN

## 2017-09-01 MED ORDER — FUROSEMIDE 40 MG PO TABS
40.0000 mg | ORAL_TABLET | Freq: Once | ORAL | Status: AC
  Administered 2017-09-01: 40 mg via ORAL
  Filled 2017-09-01: qty 1

## 2017-09-01 MED ORDER — POTASSIUM CHLORIDE CRYS ER 20 MEQ PO TBCR: 20.0000 meq | EXTENDED_RELEASE_TABLET | ORAL | Status: DC | PRN

## 2017-09-01 MED ORDER — SORBITOL 70 % PO SOLN
30.0000 mL | Freq: Every day | ORAL | Status: DC | PRN
  Administered 2017-09-01: 30 mL via ORAL
  Filled 2017-09-01 (×2): qty 30

## 2017-09-01 MED ORDER — LISINOPRIL 5 MG PO TABS
5.0000 mg | ORAL_TABLET | Freq: Every day | ORAL | Status: DC
  Administered 2017-09-01 – 2017-09-06 (×6): 5 mg via ORAL
  Filled 2017-09-01 (×6): qty 1

## 2017-09-01 MED ORDER — MOVING RIGHT ALONG BOOK
Freq: Once | Status: AC
  Administered 2017-09-01: 08:00:00
  Filled 2017-09-01: qty 1

## 2017-09-01 MED ORDER — FUROSEMIDE 40 MG PO TABS
40.0000 mg | ORAL_TABLET | Freq: Two times a day (BID) | ORAL | Status: DC
  Administered 2017-09-02 – 2017-09-04 (×5): 40 mg via ORAL
  Filled 2017-09-01 (×6): qty 1

## 2017-09-01 MED ORDER — ENOXAPARIN SODIUM 40 MG/0.4ML ~~LOC~~ SOLN
40.0000 mg | SUBCUTANEOUS | Status: DC
  Administered 2017-09-01 – 2017-09-05 (×5): 40 mg via SUBCUTANEOUS
  Filled 2017-09-01 (×5): qty 0.4

## 2017-09-01 MED ORDER — GUAIFENESIN ER 600 MG PO TB12: 600.0000 mg | ORAL_TABLET | Freq: Two times a day (BID) | ORAL | Status: DC | PRN

## 2017-09-01 MED ORDER — VITAMIN B-1 100 MG PO TABS
100.0000 mg | ORAL_TABLET | Freq: Every day | ORAL | Status: DC
  Administered 2017-09-01 – 2017-09-06 (×6): 100 mg via ORAL
  Filled 2017-09-01 (×6): qty 1

## 2017-09-01 MED ORDER — SODIUM CHLORIDE 0.9% FLUSH
3.0000 mL | Freq: Two times a day (BID) | INTRAVENOUS | Status: DC
  Administered 2017-09-04 (×2): 3 mL via INTRAVENOUS

## 2017-09-01 MED ORDER — WARFARIN SODIUM 5 MG PO TABS
5.0000 mg | ORAL_TABLET | Freq: Once | ORAL | Status: AC
Start: 2017-09-01 — End: 2017-09-01
  Administered 2017-09-01: 5 mg via ORAL
  Filled 2017-09-01: qty 1

## 2017-09-01 MED ORDER — SPIRONOLACTONE 12.5 MG HALF TABLET
12.5000 mg | ORAL_TABLET | Freq: Every day | ORAL | Status: DC
  Administered 2017-09-01 – 2017-09-05 (×5): 12.5 mg via ORAL
  Filled 2017-09-01 (×5): qty 1

## 2017-09-01 MED ORDER — METOCLOPRAMIDE HCL 5 MG/ML IJ SOLN
10.0000 mg | Freq: Four times a day (QID) | INTRAMUSCULAR | Status: AC
  Administered 2017-09-01 – 2017-09-02 (×3): 10 mg via INTRAVENOUS
  Filled 2017-09-01 (×3): qty 2

## 2017-09-01 MED ORDER — INSULIN ASPART 100 UNIT/ML ~~LOC~~ SOLN
0.0000 [IU] | Freq: Three times a day (TID) | SUBCUTANEOUS | Status: DC
  Administered 2017-09-01: 4 [IU] via SUBCUTANEOUS
  Administered 2017-09-01: 2 [IU] via SUBCUTANEOUS
  Administered 2017-09-02 (×2): 4 [IU] via SUBCUTANEOUS
  Administered 2017-09-03: 2 [IU] via SUBCUTANEOUS
  Administered 2017-09-05 (×2): 4 [IU] via SUBCUTANEOUS
  Administered 2017-09-05 – 2017-09-06 (×2): 2 [IU] via SUBCUTANEOUS

## 2017-09-01 MED ORDER — FUROSEMIDE 10 MG/ML IJ SOLN
60.0000 mg | Freq: Once | INTRAMUSCULAR | Status: AC
  Administered 2017-09-01: 60 mg via INTRAVENOUS
  Filled 2017-09-01: qty 6

## 2017-09-01 NOTE — Progress Notes (Signed)
3 Days Post-Op Procedure(s) (LRB): MITRAL VALVE (MV) REPLACEMENT (N/A) TRANSESOPHAGEAL ECHOCARDIOGRAM (TEE) (N/A) Subjective: Progressing well after MVR for endocarditis, severe CHR from MR Appreciate HF service care Will DC chest tubes and tx to stepdown Slow sinus - cont a-pacing Cont coumadin for \\MVR  and preop hx DVT. Cover wit lovenox until INR 1.8  Objective: Vital signs in last 24 hours: Temp:  [97.7 F (36.5 C)-98.5 F (36.9 C)] 97.8 F (36.6 C) (01/11 0359) Pulse Rate:  [69-80] 70 (01/11 0600) Cardiac Rhythm: Atrial paced (01/11 0400) Resp:  [15-100] 21 (01/11 0600) BP: (113-147)/(72-91) 129/76 (01/11 0600) SpO2:  [85 %-100 %] 96 % (01/11 0600) Weight:  [238 lb 15.7 oz (108.4 kg)] 238 lb 15.7 oz (108.4 kg) (01/11 0600)  Hemodynamic parameters for last 24 hours: CVP:  [5 mmHg-13 mmHg] 13 mmHg  Intake/Output from previous day: 01/10 0701 - 01/11 0700 In: 1162.7 [P.O.:1140; I.V.:22.7] Out: 2030 [Urine:1850; Chest Tube:180] Intake/Output this shift: No intake/output data recorded.       Exam    General- alert and comfortable    Neck- no JVD, no cervical adenopathy palpable, no carotid bruit   Lungs- clear without rales, wheezes   Cor- regular rate and rhythm, no murmur , gallop   Abdomen- soft, non-tender   Extremities - warm, non-tender, minimal edema   Neuro- oriented, appropriate, no focal weakness   Lab Results: Recent Labs    08/31/17 0442 08/31/17 1706 09/01/17 0415  WBC 8.0  --  7.3  HGB 8.3* 9.2* 8.3*  HCT 27.0* 27.0* 27.4*  PLT 178  --  189   BMET:  Recent Labs    08/31/17 0442 08/31/17 1706 09/01/17 0415  NA 132* 134* 133*  K 4.6 4.1 3.4*  CL 95* 92* 95*  CO2 30  --  30  GLUCOSE 116* 154* 70  BUN 16 22* 22*  CREATININE 0.83 1.10 0.86  CALCIUM 8.6*  --  8.6*    PT/INR:  Recent Labs    09/01/17 0415  LABPROT 16.6*  INR 1.36   ABG    Component Value Date/Time   PHART 7.417 08/30/2017 1008   HCO3 28.1 (H) 08/30/2017 1008   TCO2 29 08/31/2017 1706   O2SAT 60.0 09/01/2017 0430   CBG (last 3)  Recent Labs    08/31/17 2051 09/01/17 0037 09/01/17 0358  GLUCAP 167* 102* 71    Assessment/Plan: S/P Procedure(s) (LRB): MITRAL VALVE (MV) REPLACEMENT (N/A) TRANSESOPHAGEAL ECHOCARDIOGRAM (TEE) (N/A) Mobilize Diuresis Plan for transfer to step-down: see transfer orders   LOS: 12 days    Reginald [name] 09/01/2017

## 2017-09-01 NOTE — Progress Notes (Signed)
Patient ID: Reginald [name], male   DOB: 03/16/1961, 57 y.o.   MRN: [id number]     Advanced Heart Failure Rounding Note  HF Cardiology: Shirlee Latch  Subjective:    MV replacement with bioprosthetic valve 08/29/17.   Yesterday milrinone stopped and IV lasix increased. Down 1 pound. CO-OX 60%.   Feeling ok. Walked 3 times. Denies SOB.    MRI spine 08/24/17 with continued evidence of Osteomyelitis and joint infection in C1-C2, but positive response to treatment. Less regional edema and diminishing paraspinal fluid collections.  Less edema at C4-C5 level.  Objective:   Weight Range: 238 lb 15.7 oz (108.4 kg) Body mass index is 35.29 kg/m.   Vital Signs:   Temp:  [97.7 F (36.5 C)-98.5 F (36.9 C)] 97.8 F (36.6 C) (01/11 0359) Pulse Rate:  [69-80] 70 (01/11 0600) Resp:  [15-100] 21 (01/11 0600) BP: (113-147)/(72-91) 129/76 (01/11 0600) SpO2:  [85 %-100 %] 96 % (01/11 0600) Weight:  [238 lb 15.7 oz (108.4 kg)] 238 lb 15.7 oz (108.4 kg) (01/11 0600) Last BM Date: 08/27/17  Weight change: Filed Weights   08/30/17 0414 08/31/17 0500 09/01/17 0600  Weight: 238 lb 15.7 oz (108.4 kg) 239 lb 6.7 oz (108.6 kg) 238 lb 15.7 oz (108.4 kg)    Intake/Output:   Intake/Output Summary (Last 24 hours) at 09/01/2017 0728 Last data filed at 09/01/2017 0600 Gross per 24 hour  Intake 1162.68 ml  Output 2030 ml  Net -867.32 ml    Physical Exam   CVP 7-8 General:  No resp difficulty. Sitting in the chair.  HEENT: normal Neck: supple. JVP 7-8 . Carotids 2+ bilat; no bruits. No lymphadenopathy or thryomegaly appreciated. Cor: PMI nondisplaced. Regular rate & rhythm. No rubs, gallops or murmurs. Sternal dressing intact.  Lungs: clear on  Abdomen: soft, nontender, nondistended. No hepatosplenomegaly. No bruits or masses. Good bowel sounds. Extremities: no cyanosis, clubbing, rash, edema. RUE PICC Neuro: alert & orientedx3, cranial nerves grossly intact. moves all 4 extremities w/o difficulty. Affect  pleasant   Telemetry   A paced 70s personally reviewed.   Labs    CBC Recent Labs    08/31/17 0442 08/31/17 1706 09/01/17 0415  WBC 8.0  --  7.3  HGB 8.3* 9.2* 8.3*  HCT 27.0* 27.0* 27.4*  MCV 88.2  --  89.5  PLT 178  --  189   Basic Metabolic Panel Recent Labs    40/98/11 0345 08/30/17 1618  08/31/17 0442 08/31/17 1706 09/01/17 0415  NA 131*  --    < > 132* 134* 133*  K 4.7  --    < > 4.6 4.1 3.4*  CL 97*  --    < > 95* 92* 95*  CO2 27  --   --  30  --  30  GLUCOSE 100*  --    < > 116* 154* 70  BUN 14  --    < > 16 22* 22*  CREATININE 0.79 0.84   < > 0.83 1.10 0.86  CALCIUM 8.4*  --   --  8.6*  --  8.6*  MG 2.3 2.0  --   --   --   --    < > = values in this interval not displayed.   Liver Function Tests No results for input(s): AST, ALT, ALKPHOS, BILITOT, PROT, ALBUMIN in the last 72 hours. No results for input(s): LIPASE, AMYLASE in the last 72 hours. Cardiac Enzymes No results for input(s): CKTOTAL, CKMB, CKMBINDEX, TROPONINI in the  last 72 hours.  BNP: BNP (last 3 results) Recent Labs    07/26/17 0340 08/19/17 2300  BNP 104.6* 204.8*    ProBNP (last 3 results) No results for input(s): PROBNP in the last 8760 hours.   D-Dimer No results for input(s): DDIMER in the last 72 hours. Hemoglobin A1C No results for input(s): HGBA1C in the last 72 hours. Fasting Lipid Panel No results for input(s): CHOL, HDL, LDLCALC, TRIG, CHOLHDL, LDLDIRECT in the last 72 hours. Thyroid Function Tests No results for input(s): TSH, T4TOTAL, T3FREE, THYROIDAB in the last 72 hours.  Invalid input(s): FREET3  Other results:   Imaging    No results found.   Medications:     Scheduled Medications: . acetaminophen  1,000 mg Oral Q6H   Or  . acetaminophen (TYLENOL) oral liquid 160 mg/5 mL  1,000 mg Per Tube Q6H  . aspirin EC  325 mg Oral Daily   Or  . aspirin  324 mg Per Tube Daily  . bisacodyl  10 mg Oral Daily   Or  . bisacodyl  10 mg Rectal Daily  .  carvedilol  6.25 mg Oral BID WC  . Chlorhexidine Gluconate Cloth  6 each Topical Daily  . docusate sodium  200 mg Oral Daily  . furosemide  60 mg Intravenous BID  . insulin detemir  15 Units Subcutaneous BID  . metoCLOPramide (REGLAN) injection  10 mg Intravenous Q6H  . mometasone-formoterol  2 puff Inhalation BID  . pantoprazole  40 mg Oral Daily  . sodium chloride flush  10-40 mL Intracatheter Q12H  . Warfarin - Physician Dosing Inpatient   Does not apply q1800    Infusions: . sodium chloride Stopped (08/30/17 1453)  . sodium chloride Stopped (08/30/17 0700)  . sodium chloride Stopped (08/30/17 1453)  . sodium chloride    . DAPTOmycin (CUBICIN)  IV Stopped (08/31/17 1901)    PRN Medications: sodium chloride, fentaNYL (SUBLIMAZE) injection, levalbuterol, ondansetron (ZOFRAN) IV, oxyCODONE, potassium chloride, sodium chloride flush, traMADol    Patient Profile   Reginald [name] is a 57 y.o. 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   [facility] with resp distress and edema  Bioprosthetic MVR 08/29/17.   Assessment/Plan   1. Acute on chronic diastolic CHF: Now s/p MV replacement.  He is volume overloaded with pulmonary edema post-op.   CO-OX stable off milrinone.  - CVP 7-8. Give one more dose of IV lasix. Tomorrow start 40 mg po lasix twice a day.  K 3.4 . Give 40 meq K x1.  Add 12.5 mg spiro daily.   BMEt in am.    2. 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  Last blood cultures 12/25 NGTD. He is on daptomycin.  Now s/p MV  replacement with bioprosthetic valve.   3. Anemia: Suspect anemia of chronic disease, got 1 unit PRBCs 12/31 1/9 - Hgb 8.3  - FOBT was recently negative.  4. DVT: Left PT vein (below knee).  INR 1.36. Dosing of warfarin per Dr Maren BeachVantrigt.   Pharmacy to follow up with Dr Maren BeachVantrigt about DVT treatment.  5. Rhythm: Junctional rhythm post-op, no atrial fibrillation.  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  A paced 70 currently.     Amy Clegg NP-C  09/01/2017   Patient seen with NP, agree with the above note.  Volume status improving, agree with transition to po Lasix.  CVP 7-8.  He does not look particularly volume overloaded at this point.   Continuing coumadin for below the knee DVT and new bioprosthetic mitral valve (would aim for 3 months).   I turned down pacing rate today to check underlying rhythm: sinus with long 1st degree AVB, rate 60s-70.  He should be able to have pacing wires out soon. Avoid beta blockade.   Marca Ancona 09/01/2017 8:49 AM

## 2017-09-01 NOTE — Progress Notes (Signed)
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 Acute DVT 1/7. S/p MVR 08/29/17; intub 1/8 for sx, with successful extub 1/9.    PT Comments    Pt performed 3rd walk of the day with max VCs for encouragement.  Post session patient required transfer to commode for BM.  Pt unable to perform pericare and required total assistance for pericare.  Plan for HHPT with support from wife remains appropriate.  Informed nursing that patient should perform additonal walk this pm.  Plan next session for gait with RW to simulate transition to home environment.  Pt on 2L [place] at sats decreased to min 80s%, with seated break they returned greater than 90%.  BP pre session 111/72, posy session 127/78.     Follow Up Recommendations  Home health PT;Supervision/Assistance - 24 hour     Equipment Recommendations  Rolling walker with 5" wheels    Recommendations for Other Services OT consult     Precautions / Restrictions Precautions Precautions: Fall;Sternal Precaution Comments: debility, watch O2 sats Restrictions Weight Bearing Restrictions: Yes Other Position/Activity Restrictions: Sternal    Mobility  Bed Mobility Overal bed mobility: Needs Assistance Bed Mobility: Supine to Sit     Supine to sit: Min assist     General bed mobility comments: Cues for holding to heart pillow to maintain  sternal precautions.  Pt required assistance for LE advancement, scooting to edge of bed and trunk elevation.    Transfers Overall transfer level: Needs assistance Equipment used: 4-wheeled walker(EVA walker) Transfers: Sit to/from Stand Sit to Stand: Min assist         General transfer comment: Cues for hand placement on B knees, rocking momentum used to achieve standing.  Pt performed from edge of bed and from commode.    Ambulation/Gait Ambulation/Gait assistance: Min assist Ambulation Distance (Feet): 290 Feet Assistive device: 4-wheeled walker(EVA walker) Gait Pattern/deviations: Step-through pattern;Decreased stride length;Trunk flexed;Drifts right/left;Narrow base of support Gait velocity: Decreased Gait velocity interpretation: Below normal speed for age/gender General Gait Details: Cues for upper trunk control, increasing BOS and managing advancment of EVA walker.     Stairs            Wheelchair Mobility    Modified Rankin (Stroke Patients Only)       Balance Overall balance assessment: Needs assistance   Sitting balance-Leahy Scale: Good       Standing balance-Leahy Scale: Poor Standing balance comment: min assist for dynamic gait activites with UE support.                              Cognition Arousal/Alertness: Awake/alert Behavior During Therapy: WFL for tasks assessed/performed Overall Cognitive Status: Within Functional Limits for tasks assessed                                        Exercises      General Comments  Pertinent Vitals/Pain Pain Assessment: 0-10 Pain Score: 6  Pain Location: Sternal incision Pain Descriptors / Indicators: Sore;Discomfort Pain Intervention(s): Monitored during session;Repositioned    Home Living                      Prior Function            PT Goals (current goals can now be found in the care plan section) Acute Rehab PT Goals Patient Stated Goal: go  home Potential to Achieve Goals: Good Progress towards PT goals: Progressing toward goals    Frequency           PT Plan Current plan remains appropriate    Co-evaluation              AM-PAC PT "6 Clicks" Daily Activity  Outcome Measure  Difficulty turning over in bed (including adjusting bedclothes, sheets and blankets)?: None Difficulty moving from lying on back to sitting on the side of the bed? : Unable Difficulty sitting down on and standing up from a chair with arms (e.g., wheelchair, bedside commode, etc,.)?: Unable Help needed moving to and from a bed to chair (including a wheelchair)?: A Little Help needed walking in hospital room?: A Little Help needed climbing 3-5 steps with a railing? : A Lot 6 Click Score: 14    End of Session Equipment Utilized During Treatment: Gait belt;Oxygen Activity Tolerance: Patient limited by fatigue Patient left: in chair;with call bell/phone within reach;with family/visitor present Nurse Communication: Mobility status PT Visit Diagnosis: Unsteadiness on feet (R26.81);Other abnormalities of gait and mobility (R26.89);Muscle weakness (generalized) (M62.81)     Time: 1610-9604 PT Time Calculation (min) (ACUTE ONLY): 42 min  Charges:  $Gait Training: 8-22 mins $Therapeutic Activity: 23-37 mins                    G CodesJoycelyn Rua, PTA pager [phone]    Florestine Avers 09/01/2017, 11:36 AM

## 2017-09-01 NOTE — Evaluation (Signed)
Clinical/Bedside Swallow Evaluation Patient Details  Name: Reginald [name] MRN: [id number] Date of Birth: 03/11/1961  Today's Date: 09/01/2017 Time: SLP Start Time (ACUTE ONLY): 1556 SLP Stop Time (ACUTE ONLY): 1612 SLP Time Calculation (min) (ACUTE ONLY): 16 min  Past Medical History:  Past Medical History:  Diagnosis Date  . Acute osteomyelitis of cervical spine (HCC) 08/10/2017  . Alcohol abuse   . CAD (coronary artery disease)   . Diabetes mellitus type 2 in obese (HCC)   . Mitral regurgitation   . Neck pain   . 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 Tobacco abuse    Past Surgical History:  Past Surgical History:  Procedure Laterality Date  . HERNIA REPAIR  2007  . MITRAL VALVE REPLACEMENT N/A 08/29/2017   Procedure: MITRAL VALVE (MV) REPLACEMENT;  Surgeon: Kerin PernaVan Trigt, Peter, MD;  [facility];  Service: Open Heart Surgery;  Laterality: N/A;  . MULTIPLE EXTRACTIONS WITH ALVEOLOPLASTY N/A 08/07/2017   Procedure: Extraction of tooth #'s 6,11,12,14,19-29 and 32 with alveoloplasty;  Surgeon: Charlynne PanderKulinski, Ronald F, DDS;  [facility];  Service: Oral Surgery;  Laterality: N/A;  . RIGHT/LEFT HEART CATH AND CORONARY ANGIOGRAPHY N/A 08/02/2017   Procedure: RIGHT/LEFT HEART CATH AND CORONARY ANGIOGRAPHY;  Surgeon: Kathleene HazelMcAlhany, Christopher D, MD;  Location: [facility] INVASIVE CV LAB;  Service: Cardiovascular;  Laterality: N/A;  . TEE WITHOUT CARDIOVERSION N/A 08/29/2017   Procedure: TRANSESOPHAGEAL ECHOCARDIOGRAM (TEE);  Surgeon: Donata ClayVan Trigt, Theron AristaPeter, MD;  [facility];  Service: Open Heart Surgery;  Laterality: N/A;  . 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 Admitted back to hospital from CIR 08/20/17 with resp distress and edema. Acute DVT 1/7. S/p MVR 08/29/17; intubated 1/8-/9. CXR 1/10 Bibasilar atelectasis with small pleural effusions bilaterally. No consolidation. Stable cardiac prominence. There is aortic atherosclerosis.   Assessment / Plan / Recommendation Clinical Impression  Despite recent total dental extraction this admission, pt demonstrated functional manipulation, mastication and transit with solid. No s/s poor airway compromise with thin or solid. Will keep pt on Dys 3 texture and educated re: other menu options available that may be softer. Continue thin liquids and pills with thin. No further swallowing treatment needed by ST. Pt may be upgraded to regular when pt feels he can tolerate via MD. Speech-language-cognitive evaluation ordered and will be completed as able.   SLP Visit Diagnosis: Dysphagia, unspecified (R13.10)    Aspiration Risk  Mild aspiration risk    Diet Recommendation Dysphagia 3 (Mech soft);Thin liquid   Liquid Administration via: Cup;Straw Medication Administration: Whole meds with liquid Supervision: Patient able to self feed Compensations: Slow rate;Small sips/bites Postural Changes: Seated upright at 90 degrees    Other  Recommendations Oral Care Recommendations: Oral care BID   Follow up Recommendations None      Frequency and Duration            Prognosis        Swallow Study   General HPI: [name] a 57 y.o.malewith PMH significant ofspinal osteomyelitis, alcohol abuse, DMII, paraspinal abscess, tobacco abuse, nonobstructive CAD, mitral regurgitation whohad very complicated admissionfrom 07/25/2017 to 08/10/2017 due to mitral valve large vegetation/endocarditis,  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 Admitted back to hospital from CIR 08/20/17 with resp distress and edema. Acute DVT 1/7. S/p MVR 08/29/17; intubated 1/8-/9. CXR 1/10 Bibasilar atelectasis with small pleural effusions bilaterally. No consolidation. Stable cardiac prominence. There is aortic atherosclerosis. Type of Study: Bedside Swallow Evaluation Previous Swallow Assessment: (see HPI) Diet Prior to this Study: Dysphagia 3 (soft);Thin liquids Temperature Spikes Noted: No Respiratory Status: Nasal cannula History of Recent Intubation: Yes Length of Intubations (days): 2 days Date extubated: 08/30/17 Behavior/Cognition: Alert;Cooperative;Pleasant mood Oral Cavity Assessment: Within Functional Limits Oral Care Completed by SLP: No Oral Cavity - Dentition: Edentulous Vision: Functional for self-feeding Self-Feeding Abilities: Able to feed self Patient Positioning: Upright in bed Baseline Vocal Quality: Normal Volitional Cough: Strong Volitional Swallow: Able to elicit    Oral/Motor/Sensory Function Overall Oral Motor/Sensory Function: Within functional limits   Ice Chips Ice chips: Not tested   Thin Liquid Thin Liquid: Within functional limits Presentation: Cup;Straw Pharyngeal  Phase Impairments: (none)    Nectar Thick Nectar Thick Liquid: Not tested   Honey Thick Honey Thick Liquid: Not tested   Puree Puree: Not tested   Solid   GO   Solid: Within functional limits        Royce Macadamia 09/01/2017,4:29 PM  Breck Coons Lonell Face.Ed [employer] [phone]

## 2017-09-01 NOTE — Discharge Summary (Signed)
Physician Discharge Summary  Patient ID: Reginald [name] MRN: [id number] DOB/AGE: [birth year] 58 y.o.  Admit date: 08/20/2017 Discharge date: 09/06/2017  Admission Diagnoses: Acute respiratory failure  Discharge Diagnoses:  Principal Problem:   Acute respiratory failure ([place]) Active Problems:   MRSA bacteremia   Mitral valve vegetation   Acute diastolic CHF (congestive heart failure) (HCC)   Endocarditis of mitral valve   Cerebral septic emboli (HCC)   Hyponatremia   Essential hypertension   Type 2 diabetes mellitus with peripheral neuropathy (HCC)   S/P MVR (mitral valve replacement)   Patient Active Problem List   Diagnosis Date Noted  . S/P MVR (mitral valve replacement) 08/29/2017  . SOB (shortness of breath)   . Lethargy   . Benign essential HTN   . Vascular headache   . Acute blood loss anemia   . Hyponatremia   . Essential hypertension   . Type 2 diabetes mellitus with peripheral neuropathy (HCC)   . Chronic pain syndrome   . Septic embolism ([place]) 08/10/2017  . Acute osteomyelitis of cervical spine ([place]) 08/10/2017  . 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 Debility 08/10/2017  . Hypertensive heart disease with heart failure ([employer])   . Endotracheal tube present   . Acute respiratory disease   . 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 Acute respiratory failure ([place]) 07/26/2017  . Alcohol abuse 07/26/2017  . Thrombocytopenia ([place]) 07/26/2017  . Abnormal liver enzymes 07/26/2017  . Acute encephalopathy 07/26/2017  . Acute diastolic CHF (congestive heart failure) ([place]) 07/26/2017  . Sepsis ([place])   . Endocarditis of mitral valve   . Cerebral septic emboli ([place])   . MRSA bacteremia 07/25/2017     HPI: at time of admission   Reginald [name] is a 57 y.o. male with medical history significant of diastolic CHF, AAA, COPD, DM type II, tobacco abuse, and alcohol abuse; who presents as a transfer from [facility] for MRSA bacteremia.  Patient was hospitalized  from 11/29 -12/4, and had initially presented with nonspecific complaints of generalized weakness, low-grade fever, vomiting, and diffuse muscle aches. In the emergency department patient was noted to be hypoxic requiring oxygen which he was not on at baseline.  Patient was found to be negative for influenza and had negative CT after noting steroid injection to the neck on 11/28.  Blood cultures had been obtained on admission and noted to grow out MRSA.  Antibiotics of vancomycin were added at that time.  2D echo revealed signs of an anterior mitral valve vegetation and EF was noted to be around 60%.  They have been administering IV Lasix to help diurese the patient as he appeared to x-rays initially showed signs of pulmonary edema.  He went into significant alcohol withdrawals requiring transfer to the ICU and initiation of Precedex drip on 12/1-2, but gtt was able to be weaned off on 12/3.  Repeat blood cultures on 12/1 and 12/3 were noted to be positive for MRSA, despite antibiotics.  Urine cultures did not grow out any specific bacteria and he was discontinued on Rocephin.  Dr. Johnnye Sima of infectious disease was consulted at [facility] and it was recommended to transfer the patient.  It appears during his hospital stay patient was also noted to a 4.5 cm ascending aortic aneurysm and MRI of the cervical spine showed degenerative changes without discitis or fractures.    Discharged Condition: good  Hospital Course: The patient was admitted for further evaluation and treatment.  Infectious disease consultation was obtained as well as  cardiology.  Infectious disease assist with antibiotic management and full cardiology evaluation was undertaken.  The patient had an acute encephalopathy as well as hypoxic respiratory failure.  Critical care medicine was also consulted to assist with management.  He was determined to have an MRSA bacteremia and also mitral valve vegetations.  He required aggressive diuresis for  findings consistent with diastolic congestive failure exacerbation.  The patient was also evaluated with MRI for neck pain and found to have findings consistent with degenerative changes.  His diabetes was also under very poor control and aggressive management was undertaken to stabilize his blood sugars.  He additionally had elevated liver enzymes with stabilized over time.  The patient also had a preoperative DVT.  He required dental evaluation for poor caries as well as surgical procedure as described below.  He made significant overall medical improvement but was felt to require a short stay in CIR for rehabilitation prior to proceeding with surgical intervention for his mitral valve regurgitation and endocarditis.  He was transferred to CIR however developed a acute exacerbation of his heart failure.  He was transitioned quickly back to the hospital and stabilized.  After further medical management he was determined to be acceptable for proceeding with mitral valve replacement which was done on 08/29/2017 by Dr. Darcey Nora.  He tolerated the procedure well and was taken to the surgical intensive care unit in stable condition.  Postoperative hospital course  The patient has done well.  He has maintained stable hemodynamics initially requiring inotropic support but this was able to be weaned over the first several days postoperatively.  Additionally all routine lines, monitors and drainage devices have been discontinued over time using usual protocols.  He has been continued on Cubicin postoperatively as per recommendations of infectious disease.  The advanced heart failure service has assisted with postoperative management and maximizing medical condition.  He has maintained a slow sinus rhythm and his current paced.  He has been started on Coumadin for mitral valve replacement.  He has postoperative volume overload and is responding to diuretics.  He has an expected acute blood loss anemia which is stabilized.   Incisions are noted to be healing well without evidence of infection.  He is tolerating gradually increasing activities using cardiac rehab modalities.  He has been weaned from oxygen and maintains good saturations.  Most recent INR is 1.68.  Most recent BUN and creatinine are12/.75. most recent hemoglobin and hematocrit are 8.4/27.1. arrangements will be made for outpatient Cubicin antibiotic therapy.This will be till Feb 19. Will also arrange for [facility] to draw INR to adjust coumadin dosing   Consults: cardiology, ID and rehabilitation medicine, CCM  Significant Diagnostic Studies: cardiac cath and CT scans, echocardiogram (Primary)    Procedures   RIGHT/LEFT HEART CATH AND CORONARY ANGIOGRAPHY  Conclusion     Mid RCA lesion is 40% stenosed.  Ost 2nd Mrg to 2nd Mrg lesion is 20% stenosed.  Hemodynamic findings consistent with moderate pulmonary hypertension.   1. Mild non-obstructive CAD  Recommendations: Medical management of CAD. Continue planning for MVR.    Results for orders placed or performed during the hospital encounter of 08/20/17  Fungus Culture With Stain     Status: None (Preliminary result)   Collection Time: 08/29/17 10:18 AM  Result Value Ref Range Status   Fungus Stain Final report  Final    Comment: (NOTE) Performed At: [facility] [address] [place], [place] [id number] Rush Farmer MD JA:[id number]    Fungus (Mycology)  Culture PENDING  Incomplete   Fungal Source WOUND  Final    Comment: MITRAL VALVE  Aerobic/Anaerobic Culture (surgical/deep wound)     Status: None   Collection Time: 08/29/17 10:18 AM  Result Value Ref Range Status   Specimen Description WOUND MITRAL VALVE ON SWABS  Final   Special Requests PATIENT ON FOLLOWING ZINACEF VANC AND CUBICIN  Final   Gram Stain NO WBC SEEN NO ORGANISMS SEEN   Final   Culture No growth aerobically or anaerobically.  Final   Report Status 09/03/2017 FINAL  Final  Acid Fast Smear (AFB)      Status: None   Collection Time: 08/29/17 10:18 AM  Result Value Ref Range Status   AFB Specimen Processing Concentration  Final   Acid Fast Smear Negative  Final    Comment: (NOTE) Performed At: [facility] [place], [place] [id number] Rush Farmer MD MV:[id number]    Source (AFB) WOUND  Final    Comment: MITRAL VALVE  Fungus Culture Result     Status: None   Collection Time: 08/29/17 10:18 AM  Result Value Ref Range Status   Result 1 Comment  Final    Comment: (NOTE) KOH/Calcofluor preparation:  no fungus observed. Performed At: [facility] [place], [place] [id number] Rush Farmer MD UU:[id number]   Surgical pcr screen     Status: None   Collection Time: 08/29/17  2:07 PM  Result Value Ref Range Status   MRSA, PCR NEGATIVE NEGATIVE Final   Staphylococcus aureus NEGATIVE NEGATIVE Final    Comment: (NOTE) The Xpert SA Assay (FDA approved for NASAL specimens in patients 23 years of age and older), is one component of a comprehensive surveillance program. It is not intended to diagnose infection nor to guide or monitor treatment.   Culture, respiratory (NON-Expectorated)     Status: None   Collection Time: 08/29/17  2:43 PM  Result Value Ref Range Status   Specimen Description TRACHEAL ASPIRATE  Final   Special Requests Normal  Final   Gram Stain   Final    FEW WBC PRESENT, PREDOMINANTLY PMN RARE GRAM POSITIVE COCCI IN CHAINS RARE GRAM POSITIVE RODS RARE GRAM NEGATIVE COCCOBACILLI    Culture Consistent with normal respiratory flora.  Final   Report Status 09/01/2017 FINAL  Final   Dg Chest 2 View  Result Date: 09/02/2017 CLINICAL DATA:  Post mitral valve repair EXAM: CHEST  2 VIEW COMPARISON:  09/01/2017 FINDINGS: Prior median sternotomy and mitral valve repair. Interval removal of bilateral chest tubes. No pneumothorax. Cardiomegaly with vascular congestion. Bibasilar atelectasis and small effusions. IMPRESSION:  Interval removal of bilateral chest tubes without pneumothorax. Continued vascular congestion, bibasilar atelectasis and small effusions. Electronically Signed   By: Rolm Baptise M.D.   On: 09/02/2017 09:06   Dg Chest 2 View  Result Date: 08/27/2017 CLINICAL DATA:  57 year old male with mitral valve endocarditis, chest pain EXAM: CHEST  2 VIEW COMPARISON:  Recent prior chest x-ray 08/22/2017; chest CT 08/23/2017 FINDINGS: Stable cardiac and mediastinal contours. Atherosclerotic calcifications are present in the transverse aorta. Slight interval increase in diffuse bilateral interstitial and airspace opacities with relatively increased confluence in the right upper lobe. Small left pleural effusion with associated basilar atelectasis. No acute osseous abnormality. IMPRESSION: 1. Similar to slightly increased bilateral interstitial and airspace opacities which remain most consistent with pulmonary edema. 2. Increase confluence of the airspace opacities in the periphery of the right upper lung again noted. This may represent asymmetric pulmonary  edema, or a focus of active infection/inflammation such as pneumonia. 3. Persistent small left larger than right bilateral pleural effusions and associated bibasilar atelectasis. 4.  Aortic Atherosclerosis (ICD10-170.0) Electronically Signed   By: Jacqulynn Cadet M.D.   On: 08/27/2017 10:20   Dg Chest 2 View  Result Date: 08/22/2017 CLINICAL DATA:  57 year old male with a history of congestive heart failure EXAM: CHEST  2 VIEW COMPARISON:  08/19/2017, 08/16/2017 FINDINGS: Cardiomediastinal silhouette unchanged in size and contour. Interlobular septal thickening bilateral lungs with mixed linear and patchy airspace opacities. Compare to the prior there is mild improved aeration on the left. No pneumothorax. Blunting at the left costophrenic angle with meniscus on the lateral view. IMPRESSION: Mildly improved aeration of the left lung, with evidence of persisting  pulmonary edema and small left pleural effusion. Electronically Signed   By: Corrie Mckusick D.O.   On: 08/22/2017 09:44   Dg Chest 2 View  Result Date: 08/16/2017 CLINICAL DATA:  Shortness of Breath EXAM: CHEST  2 VIEW COMPARISON:  August 08, 2017 FINDINGS: There is a left pleural effusion. There is diffuse interstitial and patchy alveolar edema bilaterally. There is mild cardiac enlargement with pulmonary venous hypertension. There is aortic atherosclerosis. No evident adenopathy. There is degenerative change in the thoracic spine. IMPRESSION: Interstitial and patchy alveolar edema. Small left pleural effusion. Heart mildly enlarged with mild pulmonary venous hypertension. Suspect digestive heart failure as most likely etiology. There is aortic atherosclerosis. Aortic Atherosclerosis (ICD10-I70.0). Electronically Signed   By: Lowella Grip III M.D.   On: 08/16/2017 08:45   Dg Chest 2 View  Result Date: 08/08/2017 CLINICAL DATA:  Shortness of breath. EXAM: CHEST  2 VIEW COMPARISON:  07/31/2017 FINDINGS: Motion degraded study. AP and lateral views were obtained. Endotracheal and NG tube seen on the prior study are no longer evident. Asymmetric elevation right hemidiaphragm noted. There is bibasilar collapse/ consolidation with possible tiny bilateral pleural effusions. The cardio pericardial silhouette is enlarged. The visualized bony structures of the thorax are intact. Telemetry leads overlie the chest. IMPRESSION: 1. Cardiomegaly with vascular congestion. 2. Bibasilar collapse/consolidation with small effusion. Electronically Signed   By: Misty Stanley M.D.   On: 08/08/2017 09:46   Ct Head Wo Contrast  Result Date: 08/28/2017 CLINICAL DATA:  Posterior headache EXAM: CT HEAD WITHOUT CONTRAST TECHNIQUE: Contiguous axial images were obtained from the base of the skull through the vertex without intravenous contrast. COMPARISON:  None. FINDINGS: Brain: No acute intracranial abnormality. Specifically,  no hemorrhage, hydrocephalus, mass lesion, acute infarction, or significant intracranial injury. Vascular: No hyperdense vessel or unexpected calcification. Skull: No acute calvarial abnormality. Sinuses/Orbits: No acute finding. Other: None IMPRESSION: No intracranial abnormality. Electronically Signed   By: Rolm Baptise M.D.   On: 08/28/2017 12:20   Ct Angio Chest Pe W Or Wo Contrast  Result Date: 08/23/2017 CLINICAL DATA:  Acute respiratory failure and hypoxia. Lower extremity swelling for 3 days. EXAM: CT ANGIOGRAPHY CHEST WITH CONTRAST TECHNIQUE: Multidetector CT imaging of the chest was performed using the standard protocol during bolus administration of intravenous contrast. Multiplanar CT image reconstructions and MIPs were obtained to evaluate the vascular anatomy. CONTRAST:  17m ISOVUE-370 IOPAMIDOL (ISOVUE-370) INJECTION 76% COMPARISON:  08/01/2017 FINDINGS: Cardiovascular: Good opacification of the central and segmental pulmonary arteries. No focal filling defects. No evidence of significant pulmonary embolus. Normal heart size. No pericardial effusion. Coronary artery calcifications. Calcification of the aorta. Dilated ascending thoracic aorta at 4.4 cm. Mediastinum/Nodes: Prominent mediastinal lymph nodes without pathologic enlargement, likely  reactive. Esophagus is decompressed. Thyroid gland is atrophic. Lungs/Pleura: Diffuse patchy airspace disease throughout both lungs. This may represent edema or multifocal pneumonia. Small bilateral pleural effusions with basilar atelectasis. Infiltrates have progressed since previous study. Effusions are unchanged. No pneumothorax. Secretions demonstrated within some of the basal bronchioles, possibly representing inflammatory secretions or mucous plugging. Upper Abdomen: No acute abnormality. Musculoskeletal: Degenerative changes in the spine. No destructive bone lesions. Review of the MIP images confirms the above findings. IMPRESSION: 1. No evidence of  significant pulmonary embolus. 2. Dilated ascending thoracic aorta at 4.4 cm. Recommend annual imaging followup by CTA or MRA. This recommendation follows 2010 ACCF/AHA/AATS/ACR/ASA/SCA/SCAI/SIR/STS/SVM Guidelines for the Diagnosis and Management of Patients with Thoracic Aortic Disease. Circulation. 2010; 121: [id number] 3. Diffuse patchy airspace disease throughout both lungs, progressing since previous study. This could represent edema or multifocal pneumonia. Small bilateral pleural effusions with basilar atelectasis. Secretions versus mucous plugging in the lower lobe bronchioles. 4. Aortic atherosclerosis.  Coronary artery calcification. Aortic Atherosclerosis (ICD10-I70.0). Aortic aneurysm NOS (ICD10-I71.9). Electronically Signed   By: Lucienne Capers M.D.   On: 08/23/2017 23:49   Mr Cervical Spine Wo Contrast  Result Date: 08/24/2017 CLINICAL DATA:  Followup spinal infection EXAM: MRI CERVICAL SPINE WITHOUT CONTRAST TECHNIQUE: Multiplanar, multisequence MR imaging of the cervical spine was performed. No intravenous contrast was administered. COMPARISON:  MRI 08/08/2017.  MRI 06/2028 2018.  CT 07/20/2017. FINDINGS: Alignment: No change in alignment. Vertebrae: The patient has osteomyelitis at the C1-2 level with septic arthritis of the skullbase C1 articulation and C1-C2 articulation. Today's study was ordered and performed without contrast, hindering evaluation to some degree. I think there is slightly less edema in the region and I think that paraspinous fluid collections are smaller. I do not see any progressive disease or new finding. No cord compression. Mild edema at the C4-5 level also appears slightly improved without progression or new finding. Chronic discogenic endplate changes at E9-3 appear the same. Cord: No cord compression or primary cord lesion. Posterior Fossa, vertebral arteries, paraspinal tissues: As discussed above. Disc levels: As discussed above. IMPRESSION: Today's study was done  without contrast. Continued evidence of osteomyelitis and joint infection in the C1-2 region, with evidence of positive response to treatment since the study of 08/08/2017. There is less regional edema and the small paraspinous fluid collections are diminishing. No progressive or worsening findings. There is also less edema at the C4-5 level. No worsening or new finding. Electronically Signed   By: Nelson Chimes M.D.   On: 08/24/2017 10:06   Mr Cervical Spine W Wo Contrast  Addendum Date: 08/08/2017   ADDENDUM REPORT: 08/08/2017 15:17 ADDENDUM: These results were called by telephone at the time of interpretation on 08/08/2017 at 3:15 pm to the patient's nurse, Luetta Nutting,, who verbally acknowledged these results. Electronically Signed   By: Inge Rise M.D.   On: 08/08/2017 15:17   Result Date: 08/08/2017 CLINICAL DATA:  Neck pain and patient with bacterial endocarditis. Question infection. EXAM: MRI CERVICAL SPINE WITHOUT AND WITH CONTRAST TECHNIQUE: Multiplanar and multiecho pulse sequences of the cervical spine, to include the craniocervical junction and cervicothoracic junction, were obtained without and with intravenous contrast. CONTRAST:  20 ml MULTIHANCE GADOBENATE DIMEGLUMINE 529 MG/ML IV SOLN COMPARISON:  CT angiogram of the neck 07/28/2017. FINDINGS: Alignment: There is trace retrolisthesis of C4 on C5. Alignment is otherwise maintained. Vertebrae: Abnormal edema and enhancement are seen in the C4-5 endplates consistent with discitis and osteomyelitis. No epidural abscess is identified. The patient also  has abnormal edema and enhancement in the odontoid process and in the anterior arch of C1. Marrow edema and enhancement are also identified at the articulation of the occipital condyles and lateral masses of C1 which appears worse on the right where there is an associated small effusion highly suspicious for septic joint. Cord: Normal signal throughout. Posterior Fossa, vertebral arteries,  paraspinal tissues: There is a small fluid collection along the right lateral mass of C2 measuring approximately 1.8 cm AP by 0.3 cm transverse by 0.9 cm craniocaudal worrisome for abscess. Edema and enhancement are seen and posterior paraspinous musculature, particularly the semispinalis muscle on the right where there is an abscess measuring 1.2 cm AP by 0.3 cm transverse at the level of the C2-3 disc interspace. This abscess likely communicates with the abscess described above. Although no epidural abscess is identified, there is epidural enhancement posterior to C2 and at C4-5 and extensive prevertebral soft tissue edema and enhancement. Disc levels: Patient motion somewhat limits evaluation of the disc interspaces. C1-2:  The central canal is open. C2-3:  Mild disc bulge without stenosis. C3-4:  Minimal disc bulge without stenosis. C4-5: Shallow disc bulge effaces the ventral thecal sac. Uncovertebral disease causes mild to moderate foraminal narrowing, worse on the left. C5-6: There is a shallow disc bulge, uncovertebral disease and facet arthropathy. The ventral thecal sac appears effaced. Severe left and moderate right foraminal narrowing is identified. C6-7: Shallow disc bulge without central canal stenosis. Mild to moderate left foraminal narrowing is seen. The right foramen is open. C7-T1:  Negative. IMPRESSION: Findings consistent with osteomyelitis in the odontoid process of C2 and anterior arch of C1. Negative for epidural abscess. Marrow edema and enhancement about the articulation of the occipital condyles and lateral masses of C1, worse on the right, consistent osteomyelitis and septic joint. As described above, there is a small abscess along the right lateral mass of C2 in the paraspinous soft tissues. Findings consistent with discitis and osteomyelitis at C4-5. Negative for epidural abscess. Edema and enhancement in paraspinous musculature consistent with myositis. Changes are worse on the right  where there is a small abscess in semispinalis muscle at the level of the C2-3 disc interspace. This abscess likely communicates with the abscess along the right lateral mass of C2 described above. Electronically Signed: By: Inge Rise M.D. On: 08/08/2017 15:11   Dg Chest Port 1 View  Result Date: 09/01/2017 CLINICAL DATA:  Status post mitral valve replacement. Chest tube present. EXAM: PORTABLE CHEST 1 VIEW COMPARISON:  August 31, 2017 FINDINGS: Chest tubes remain bilaterally without pneumothorax. Cordis has been removed. There is a peripherally inserted central catheter with the tip in the superior vena cava near the cavoatrial junction, stable. There is a small pleural effusion on each side with bibasilar atelectasis. No new opacity. Heart is mildly enlarged with mitral valve replacement present. Pulmonary vascularity is normal. No adenopathy. There is aortic atherosclerosis. There is evidence of old rib trauma on the left. IMPRESSION: Tube and catheter positions as described without pneumothorax. Bibasilar atelectasis with small pleural effusions bilaterally. No consolidation. Stable cardiac prominence. There is aortic atherosclerosis. Aortic Atherosclerosis (ICD10-I70.0). Electronically Signed   By: Lowella Grip III M.D.   On: 09/01/2017 09:21   Dg Chest Port 1 View  Result Date: 08/31/2017 CLINICAL DATA:  Post mitral valve replacement EXAM: PORTABLE CHEST 1 VIEW COMPARISON:  08/30/2017 FINDINGS: Endotracheal tube has been removed. Interval placement of left PICC line with the tip at the cavoatrial junction. Bilateral chest  tubes remain in place without pneumothorax. Interval removal of Swan-Ganz catheter. Cardiomegaly with vascular congestion with continued interstitial prominence, likely interstitial edema, slightly improved. Bibasilar atelectasis and small effusions noted, stable. IMPRESSION: Interval extubation. Continued mild interstitial edema, slightly improved since prior study.  Bibasilar atelectasis and small effusions are unchanged. No pneumothorax. Electronically Signed   By: Rolm Baptise M.D.   On: 08/31/2017 08:30   Dg Chest Port 1 View  Result Date: 08/30/2017 CLINICAL DATA:  Hypoxia EXAM: PORTABLE CHEST 1 VIEW COMPARISON:  August 29, 2017 FINDINGS: Endotracheal tube tip is 5.3 cm above the carina. Swan-Ganz catheter tip is in the right main pulmonary artery. There is a chest tube on each side. There is no appreciable pneumothorax. There is a pleural effusion on the right with right base atelectasis. There is a smaller left pleural effusion. There is a degree of underlying interstitial edema. There is mild cardiomegaly with pulmonary vascularity within normal limits. There is aortic atherosclerosis. No evident adenopathy. No bone lesions. There is calcification in each carotid artery. IMPRESSION: Tube and catheter positions as described without pneumothorax. Interstitial edema with pleural effusions bilaterally and cardiomegaly. There may be a degree of underlying congestive heart failure. There is right base atelectasis. There is aortic atherosclerosis. There is bilateral carotid artery calcification. Aortic Atherosclerosis (ICD10-I70.0). Electronically Signed   By: Lowella Grip III M.D.   On: 08/30/2017 09:09   Dg Chest Port 1 View  Result Date: 08/29/2017 CLINICAL DATA:  Intubation. EXAM: PORTABLE CHEST 1 VIEW COMPARISON:  08/27/2017. FINDINGS: Endotracheal tube tip noted 4.5 cm above the carina. Swan-Ganz catheter noted with tip over the pulmonary outflow tract. Bilateral chest tubes are noted. Prior cardiac pacer. Cardiomegaly with pulmonary vascular prominence and bilateral pulmonary infiltrates suggesting pulmonary edema. Low lung volumes with mild basilar atelectasis. No prominent pleural effusion. No pneumothorax IMPRESSION: 1. Lines and tubes in position as above. Bilateral chest tubes are present. No pneumothorax. 2. Prior cardiac valve replacement. Cardiomegaly  with bilateral pulmonary infiltrates suggesting pulmonary edema. Low lung volumes with basilar atelectasis. Electronically Signed   By: Marcello Moores  Register   On: 08/29/2017 14:06   Dg Chest Port 1 View  Result Date: 08/19/2017 CLINICAL DATA:  57 y/o  M; shortness of breath. EXAM: PORTABLE CHEST 1 VIEW COMPARISON:  08/16/2017 chest radiograph. FINDINGS: Increase reticular and patchy opacities of the lungs. Increased small left pleural effusion. Stable cardiac silhouette. Aortic atherosclerosis. Moderate multilevel degenerative changes of thoracic spine. IMPRESSION: Increase interstitial and alveolar pulmonary edema. Increased small left pleural effusion. Electronically Signed   By: Kristine Garbe M.D.   On: 08/19/2017 22:47   Dg Foot Complete Left  Result Date: 08/21/2017 CLINICAL DATA:  Pain and swelling EXAM: LEFT FOOT - COMPLETE 3+ VIEW COMPARISON:  August 13, 2017 FINDINGS: Frontal, oblique, and lateral views were obtained. There is again noted a fracture of the proximal metaphysis of the fifth proximal phalanx with alignment near anatomic. There is minimal healing response in this area. No other fracture evident. No dislocation. There is narrowing of the first MTP joint. Other joint spaces appear unremarkable. There is pes cavus. There are spurs arising from the posterior and inferior calcaneus. IMPRESSION: Fracture proximal aspect of fifth proximal phalanx, stable from 1 week prior. Minimal healing response in this area. No other fracture. No dislocation. There are calcaneal spurs. There is pes cavus. There is narrowing of the first MTP joint. Electronically Signed   By: Lowella Grip III M.D.   On: 08/21/2017 13:20  Dg Foot Complete Left  Result Date: 08/14/2017 CLINICAL DATA:  57 year old male with left foot pain. No known injury. EXAM: LEFT FOOT - COMPLETE 3+ VIEW COMPARISON:  None. FINDINGS: There is a transverse fracture of the base of the proximal phalanx of the fifth digit  which is age indeterminate. There appears to be some adjacent bone reaction and callus formation. These fracture is favored to represent a subacute or chronic fracture with incomplete healing or nonunion and less likely an acute fracture. Clinical correlation is recommended. No other fracture identified. There is no dislocation. There are degenerative changes of the ankle joint. There is diffuse soft tissue swelling of the dorsum and lateral aspect of the foot. No radiopaque foreign object or soft tissue gas. IMPRESSION: Fracture of the base of the proximal phalanx of the fifth digit as described. Clinical correlation is recommended. Diffuse soft tissue swelling of the lateral and dorsal foot. Electronically Signed   By: Anner Crete M.D.   On: 08/14/2017 00:02   Result status: Final result                              *[facility]*                         [address], [place] [address]                            [phone]  ------------------------------------------------------------------- Transthoracic Echocardiography  Patient:    Reginald [name], Reginald [name] MR #:       [id number] Study Date: 08/21/2017 Gender:     M Age:        45 Height:     175.3 cm Weight:     110.6 kg BSA:        2.36 m^2 Pt. Status: Room:       Weldon, Dennard Nip  SONOGRAPHER  [facility]   [place], Inpatient  ATTENDING    Florencia Reasons [id number]  ADMITTING    Reubin Milan  cc:  ------------------------------------------------------------------- LV EF: 65% -   70%  ------------------------------------------------------------------- History:   PMH:  Septic Embolism, Former Smoker, ETOH. Mitral Valve Vegitation, MRSA. Mitral Valve Disorder.  Risk factors: Hypertension.  ------------------------------------------------------------------- Study  Conclusions  - Left ventricle: The cavity size was normal. There was moderate   concentric hypertrophy. Systolic function was vigorous. The   estimated ejection fraction was in the range of 65% to 70%. Wall   motion was normal; there were no regional wall motion   abnormalities. Features are consistent with a pseudonormal left   ventricular filling pattern, with concomitant abnormal relaxation   and increased filling pressure (grade 2 diastolic dysfunction).   Doppler parameters are consistent with elevated ventricular   end-diastolic filling pressure. - Mitral valve: Calcified annulus. Mildly thickened leaflets .   There was moderate regurgitation. - Left atrium: The atrium was moderately dilated.  Impressions:  - A highly mobile echodensity of the anterior mitral valve leaflet   measuring 20 x 15 mm (previously  30x20 mm), consistent with a   vegetation. A smaller vegetation is seen on the posterior leaflet   that appears to be perforated. There is a possible small vegation   attached to the left coronary leaflet of the aortic valve.  ------------------------------------------------------------------- Study data:  Comparison was made to the study of 08/21/2017.  Study status:  Routine.  Procedure:  Transthoracic echocardiography. Image quality was adequate.          Transthoracic echocardiography.  M-mode, complete 2D, spectral Doppler, and color Doppler.  Birthdate:  Patient birthdate: [birth year].  Age:  Patient is 57 yr old.  Sex:  Gender: male.    BMI: 36 kg/m^2.  Blood pressure:     106/74  Patient status:  Inpatient.  Study date: Study date: 08/21/2017. Study time: 02:40 PM.  Location:  Bedside.   -------------------------------------------------------------------  ------------------------------------------------------------------- Left ventricle:  The cavity size was normal. There was moderate concentric hypertrophy. Systolic function was vigorous. The estimated  ejection fraction was in the range of 65% to 70%. Wall motion was normal; there were no regional wall motion abnormalities. Features are consistent with a pseudonormal left ventricular filling pattern, with concomitant abnormal relaxation and increased filling pressure (grade 2 diastolic dysfunction). Doppler parameters are consistent with elevated ventricular end-diastolic filling pressure.  ------------------------------------------------------------------- Aortic valve:   Trileaflet; normal thickness leaflets. Mobility was not restricted.  Doppler:  Transvalvular velocity was within the normal range. There was no stenosis. There was no regurgitation.   ------------------------------------------------------------------- Aorta:  Aortic root: The aortic root was normal in size.  ------------------------------------------------------------------- Mitral valve:   Calcified annulus. Mildly thickened leaflets . Mobility was not restricted.  Doppler:  Transvalvular velocity was within the normal range. There was no evidence for stenosis. There was moderate regurgitation.    Valve area by pressure half-time: 2.78 cm^2. Indexed valve area by pressure half-time: 1.18 cm^2/m^2.    Mean gradient (D): 4 mm Hg. Peak gradient (D): 10 mm Hg.  ------------------------------------------------------------------- Left atrium:  The atrium was moderately dilated.  ------------------------------------------------------------------- Right ventricle:  The cavity size was normal. Wall thickness was normal. Systolic function was normal.  ------------------------------------------------------------------- Pulmonic valve:    Structurally normal valve.   Cusp separation was normal.  Doppler:  Transvalvular velocity was within the normal range. There was no evidence for stenosis. There was no regurgitation.  ------------------------------------------------------------------- Tricuspid valve:    Structurally normal valve.    Doppler: Transvalvular velocity was within the normal range. There was no regurgitation.  ------------------------------------------------------------------- Pulmonary artery:   The main pulmonary artery was normal-sized. Systolic pressure was within the normal range.  ------------------------------------------------------------------- Right atrium:  The atrium was normal in size.  ------------------------------------------------------------------- Pericardium:  There was no pericardial effusion.  ------------------------------------------------------------------- Systemic veins: Inferior vena cava: The vessel was dilated. The respirophasic diameter changes were blunted (< 50%), consistent with elevated central venous pressure.  ------------------------------------------------------------------- Measurements   Left ventricle                           Value          Reference  LV ID, ED, PLAX chordal                  49.6  mm       43 - 52  LV ID, ES, PLAX chordal                  27.4  mm       23 -  38  LV fx shortening, PLAX chordal           45    %        >=29  LV PW thickness, ED                      15.5  mm       ----------  IVS/LV PW ratio, ED                      0.85           <=1.3  LV e&', lateral                           8.81  cm/s     ----------  LV E/e&', lateral                         18.16          ----------  LV e&', medial                            6.96  cm/s     ----------  LV E/e&', medial                          22.99          ----------  LV e&', average                           7.89  cm/s     ----------  LV E/e&', average                         20.29          ----------    Ventricular septum                       Value          Reference  IVS thickness, ED                        13.2  mm       ----------    LVOT                                     Value          Reference  LVOT ID, S                                21    mm       ----------  LVOT area                                3.46  cm^2     ----------    Aorta                                    Value  Reference  Aortic root ID, ED                       38    mm       ----------    Left atrium                              Value          Reference  LA ID, A-P, ES                           48    mm       ----------  LA ID/bsa, A-P                           2.03  cm/m^2   <=2.2  LA volume, S                             94.1  ml       ----------  LA volume/bsa, S                         39.8  ml/m^2   ----------  LA volume, ES, 1-p A4C                   97    ml       ----------  LA volume/bsa, ES, 1-p A4C               41    ml/m^2   ----------  LA volume, ES, 1-p A2C                   76.9  ml       ----------  LA volume/bsa, ES, 1-p A2C               32.5  ml/m^2   ----------    Mitral valve                             Value          Reference  Mitral E-wave peak velocity              160   cm/s     ----------  Mitral A-wave peak velocity              90.3  cm/s     ----------  Mitral mean velocity, D                  97.3  cm/s     ----------  Mitral deceleration time                 229   ms       150 - 230  Mitral pressure half-time                79    ms       ----------  Mitral mean gradient, D                  4     mm Hg    ----------  Mitral peak gradient, D  10    mm Hg    ----------  Mitral E/A ratio, peak                   1.8            ----------  Mitral valve area, PHT, DP               2.78  cm^2     ----------  Mitral valve area/bsa, PHT, DP           1.18  cm^2/m^2 ----------  Mitral annulus VTI, D                    39.3  cm       ----------  Mitral maximal regurg velocity,          444   cm/s     ----------  PISA  Mitral regurg VTI, PISA                  112   cm       ----------    Right atrium                             Value          Reference  RA ID, S-I, ES, A4C              (H)     56.2  mm        34 - 49  RA area, ES, A4C                 (H)     25.5  cm^2     8.3 - 19.5  RA volume, ES, A/L                       86.5  ml       ----------  RA volume/bsa, ES, A/L                   36.6  ml/m^2   ----------    Systemic veins                           Value          Reference  Estimated CVP                            8     mm Hg    ----------    Right ventricle                          Value          Reference  TAPSE                                    24.4  mm       ----------  RV s&', lateral, S                        15.3  cm/s     ----------  Legend: (L)  and  (H)  mark values outside specified reference range.  ------------------------------------------------------------------- Prepared and Electronically Authenticated by  Ena Dawley,  M.D. 2018-12-31T18:01:03  MERGE Images   Show images for ECHOCARDIOGRAM COMPLETE  Patient Information   Patient Name Reginald [name], Reginald [name] Sex Male DOB [birth year] SSN [id number]  Reason For Exam  Priority: Routine  Not on file  Surgical History   Surgical History   No past medical history on file.    Other Surgical History   Procedure Laterality Date Comment Source  HERNIA REPAIR  2007  Provider  MITRAL VALVE REPLACEMENT N/A 08/29/2017 Procedure: MITRAL VALVE (MV) REPLACEMENT; Surgeon: Ivin Poot, MD; Location: [facility]; Service: Open Heart Surgery; Laterality: N/A; Provider  MULTIPLE EXTRACTIONS WITH ALVEOLOPLASTY N/A 08/07/2017 Procedure: Extraction of tooth #'s 6,11,12,14,19-29 and 32 with alveoloplasty; Surgeon: Lenn Cal, DDS; Location: [facility]; Service: Oral Surgery; Laterality: N/A; Provider  RIGHT/LEFT HEART CATH AND CORONARY ANGIOGRAPHY N/A 08/02/2017 Procedure: RIGHT/LEFT HEART CATH AND CORONARY ANGIOGRAPHY; Surgeon: Burnell Blanks, MD; Location: [place] [facility]; Service: Cardiovascular; Laterality: N/A; Provider  TEE WITHOUT CARDIOVERSION N/A 08/29/2017 Procedure: TRANSESOPHAGEAL  ECHOCARDIOGRAM (TEE); Surgeon: Prescott Gum, Collier Salina, MD; Location: [place]; Service: Open Heart Surgery; Laterality: N/A; Provider  Tonsillectomy /adnoidectomy   as aa child Provider    Patient Data   Height 69 in    BP 106/74 mmHg       Performing Technologist/Nurse   Performing Technologist/Nurse: Elvia Collum                    Implants     Prosthesis/Implant Heart  Valve Magna Mitral 69m - [id number] Implanted  Heart   Inventory item: Valve Magna Mitral 255mModel/Cat number: [id number] number: [id number]: [employer]  Area Of Implantation: Heart    As of 08/29/2017        Treatments: surgery:   PHYSICIAN:  PeIvin PootM.D.  DATE OF BIRTH:  [birth year]DATE OF PROCEDURE: DATE OF DISCHARGE:                              OPERATIVE REPORT   OPERATIONS: 1. Mitral valve replacement for endocarditis with a 29-mm Edwards     pericardial Magna Ease valve (serial #[id number] 2. Transesophageal echocardiogram. 3. Cardiopulmonary bypass with hypothermic cardioplegic arrest.  PREOPERATIVE DIAGNOSES: 1. 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     Large vegetation of the posterior leaflet with infection and     perforation of the anterior leaflet. 2. Severe pulmonary hypertension secondary to mitral regurgitation. 3. Chronic obstructive pulmonary disease, history of chronic tobacco     use. 4. 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  POSTOPERATIVE DIAGNOSES: 1. 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     Large vegetation of the posterior leaflet with infection and     perforation of the anterior leaflet. 2. Severe pulmonary hypertension secondary to mitral regurgitation. 3. Chronic obstructive pulmonary disease, history of chronic tobacco      use. 4. 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  SURGEON:  PeIvin PootMD.  ASSISTANT:  WaJohn GiovanniPA-C.  ANESTHESIA:  General by Dr. DaRoberts Gaudy  OPERATIVE REPORT  Patient:            Reginald [name] Date of Birth:  1961-05-29 MRN:                [id number]   DATE OF PROCEDURE:       08/07/2017  PREOPERATIVE DIAGNOSES: 1.  Mitral valve endocarditis 2.  Severe mitral regurgitation 3.  Pre-heart valve surgery dental protocol 4.  Chronic apical periodontitis 5.  Retained root segments 6.  Dental caries 7   Chronic periodontitis 8.  Loose teeth  POSTOPERATIVE DIAGNOSES: 1.  Mitral valve endocarditis 2.  Severe mitral regurgitation 3.  Pre-heart valve surgery dental protocol 4.  Chronic apical periodontitis 5.  Retained root segments 6.  Dental caries 7   Chronic periodontitis 8.  Loose teeth  OPERATIONS: 1. Multiple extraction of tooth numbers 6,11,12,14,19-29 and 32 2. 4 Quadrants of alveoloplasty   SURGEON: Lenn Cal, DDS  ANESTHESIA: General anesthesia via nasoendotracheal tube.      Discharge Exam: Blood pressure 127/72, pulse 71, temperature 97.7 F (36.5 C), temperature source Oral, resp. rate 18, height '5\' 9"'$  (1.753 m), weight 235 lb 8 oz (106.8 kg), SpO2 93 %.   General appearance: alert, cooperative and no distress Heart: regular rate and rhythm Lungs: slightly dim in bases Abdomen: benign Extremities: no edema Wound: incis healing well    Disposition: Discharge to home Discharge Instructions    Amb Referral to Cardiac Rehabilitation   Complete by:  As directed    Referral to [place]   Diagnosis:  Valve Repair   Valve:  Mitral   Discharge patient   Complete by:  As directed    Discharge disposition:  01-Home or Self Care   Discharge patient date:  09/06/2017   Home infusion instructions [place]  May follow [place] Dosing Protocol; May administer Cathflo as needed to maintain patency of vascular access device.; Flushing of vascular access device: per [facility] Protocol: 0.9% NaCl pre/post medica...   Complete by:  As directed    Instructions:  May follow [place] Dosing Protocol   Instructions:  May administer Cathflo as needed to maintain patency of vascular access device.   Instructions:  Flushing of vascular access device: per [facility] Protocol: 0.9% NaCl pre/post medication administration and prn patency; Heparin 100 u/ml, 61m for implanted ports and Heparin 10u/ml, 545mfor all other central venous catheters.   Instructions:  May follow [facility] Anaphylaxis Protocol for First Dose Administration in the home: 0.9% NaCl at 25-50 ml/hr to maintain IV access for protocol meds. Epinephrine 0.3 ml IV/IM PRN and Benadryl 25-50 IV/IM PRN s/s of anaphylaxis.   Instructions:  [facility] Coordinator (RN) to assist per patient IV care needs in the home PRN.     Allergies as of 09/06/2017      Reactions   Morphine And Related Other (See Comments)   "freaks out"      Medication List    STOP taking these medications   acetaminophen 325 MG tablet Commonly known as:  TYLENOL   DAPTOmycin 1,000 mg in sodium chloride 0.9 % 100 mL   feeding supplement (ENSURE ENLIVE) Liqd   gabapentin 300 MG capsule Commonly known as:  NEURONTIN   metoprolol tartrate 25 MG tablet Commonly known as:  LOPRESSOR   ondansetron 4 MG/2ML Soln injection Commonly known as:  ZOFRAN   potassium chloride 20 MEQ/15ML (10%) Soln Replaced by:  Potassium Chloride ER 20 MEQ Tbcr  TAKE these medications   aspirin 81 MG EC tablet Take 1 tablet (81 mg total) by mouth daily.   daptomycin IVPB Commonly known as:  CUBICIN Inject 1,000 mg into the vein daily. Indication:  Endocarditis Last Day of Therapy:  10/10/17 Labs - Once weekly:  CBC/D, BMP, and CPK Labs - Every other week:  ESR and CRP   folic  acid 1 MG tablet Commonly known as:  FOLVITE Take 1 tablet (1 mg total) by mouth daily.   furosemide 80 MG tablet Commonly known as:  LASIX Take 1 tablet (80 mg total) by mouth daily. What changed:    medication strength  how much to take  when to take this   insulin aspart 100 UNIT/ML injection Commonly known as:  novoLOG Inject 0-15 Units into the skin 3 (three) times daily with meals.   insulin aspart 100 UNIT/ML injection Commonly known as:  novoLOG Inject 0-5 Units into the skin at bedtime.   insulin glargine 100 UNIT/ML injection Commonly known as:  LANTUS Inject 0.12 mLs (12 Units total) into the skin daily.   lisinopril 5 MG tablet Commonly known as:  PRINIVIL,ZESTRIL Take 1 tablet (5 mg total) by mouth daily.   oxyCODONE 5 MG immediate release tablet Commonly known as:  Oxy IR/ROXICODONE Take 1 tablet (5 mg total) by mouth every 6 (six) hours as needed for severe pain. What changed:    when to take this  reasons to take this   Potassium Chloride ER 20 MEQ Tbcr Take 20 mEq by mouth 2 (two) times daily. Replaces:  potassium chloride 20 MEQ/15ML (10%) Soln   spironolactone 25 MG tablet Commonly known as:  ALDACTONE Take 1 tablet (25 mg total) by mouth daily.   thiamine 100 MG tablet Take 1 tablet (100 mg total) by mouth daily.   warfarin 5 MG tablet Commonly known as:  COUMADIN Take 1 tablet (5 mg total) by mouth daily. As directed by coumadin clinic            Home Infusion Instuctions  (From admission, onward)        Start     Ordered   09/06/17 0000  Home infusion instructions [facility] May follow [place] Dosing Protocol; May administer Cathflo as needed to maintain patency of vascular access device.; Flushing of vascular access device: per [facility] Protocol: 0.9% NaCl pre/post medica...    Question Answer Comment  Instructions May follow [place] Dosing Protocol   Instructions May administer Cathflo as needed to maintain  patency of vascular access device.   Instructions Flushing of vascular access device: per [facility] Protocol: 0.9% NaCl pre/post medication administration and prn patency; Heparin 100 u/ml, 67m for implanted ports and Heparin 10u/ml, 59mfor all other central venous catheters.   Instructions May follow [facility] Anaphylaxis Protocol for First Dose Administration in the home: 0.9% NaCl at 25-50 ml/hr to maintain IV access for protocol meds. Epinephrine 0.3 ml IV/IM PRN and Benadryl 25-50 IV/IM PRN s/s of anaphylaxis.   Instructions [facility] Infusion Coordinator (RN) to assist per patient IV care needs in the home PRN.      09/06/17 0854       Durable Medical Equipment  (From admission, onward)        Start     Ordered   09/05/17 1203  For home use only DME Walker rolling  Once    Comments:  S/p MVR  Question:  Patient needs a walker to treat with the  following condition  Answer:  Weakness   09/05/17 1202   09/05/17 1203  For home use only DME 3 n 1  Once     09/05/17 1202     Follow-up [place] Follow up on 09/11/2017.   Why:  1  pm for hospital follow up Contact information: [place] [phone] [place] Follow up.   Why:  you can utilize this clinic pharmacy for medication assistance Contact information: [place] [address] [phone]       Ivin Poot, MD Follow up.   Specialty:  Cardiothoracic Surgery Why:  Please see discharge paperwork.  Please also obtain a chest x-ray 1/2-hour prior to appointment with the surgeon at [facility].  [name] imaging is located in the same office complex as Dr. Darcey Nora. Contact information: [place] [address] [place] [address] [phone]        [facility]. Go on 09/15/2017.   Specialty:   Cardiology Why:  9:30 AM, [facility], parking code [id number] Contact information: [address] [id number] Reginald [name] [place] [phone]        The patient has been discharged on:   1.Beta Blocker:  Yes [ n  ]                              No   [   ]                              If No, reason:heart block  2.Ace Inhibitor/ARB: Yes [ y  ]                                     No  [    ]                                     If No, reason:  3.Statin:   Yes [   ]                  No  [ n  ]                  If No, reason:no cad or Hyper lipidemia dx  4.Ecasa:  Yes  [   ]                  No   [  y ]                  If No, reason:  Signed: John Giovanni 09/06/2017, 8:54 AM

## 2017-09-01 NOTE — Progress Notes (Signed)
CT Surgery  HR 40 - A-pacing not effective Stop coreg and DDD pacing at 88

## 2017-09-01 NOTE — Discharge Instructions (Signed)
Mitral Valve Replacement, Care After This sheet gives you information about how to care for yourself after your procedure. Your health care provider may also give you more specific instructions. If you have problems or questions, contact your health care provider. What can I expect after the procedure? After the procedure, it is common to have:Warfarin tablets What is this medicine? WARFARIN (WAR far in) is an anticoagulant. It is used to treat or prevent clots in the veins, arteries, lungs, or heart. This medicine may be used for other purposes; ask your health care provider or pharmacist if you have questions. COMMON BRAND NAME(S): Coumadin, Jantoven What should I tell my health care provider before I take this medicine? They need to know if you have any of these conditions: -alcoholism -anemia -bleeding disorders -cancer -diabetes -heart disease -high blood pressure -history of bleeding in the gastrointestinal tract -history of stroke or other brain injury or disease -kidney or liver disease -protein C deficiency -protein S deficiency -psychosis or dementia -recent injury, recent or planned surgery or procedure -an unusual or allergic reaction to warfarin, other medicines, foods, dyes, or preservatives -pregnant or trying to get pregnant -breast-feeding How should I use this medicine? Take this medicine by mouth with a glass of water. Follow the directions on the prescription label. You can take this medicine with or without food. Take your medicine at the same time each day. Do not take it more often than directed. Do not stop taking except on your doctor's advice. Stopping this medicine may increase your risk of a blood clot. Be sure to refill your prescription before you run out of medicine. If your doctor or healthcare professional calls to change your dose, write down the dose and any other instructions. Always read the dose and instructions back to him or her to make sure you  understand them. Tell your doctor or healthcare professional what strength of tablets you have on hand. Ask how many tablets you should take to equal your new dose. Write the date on the new instructions and keep them near your medicine. If you are told to stop taking your medicine until your next blood test, call your doctor or healthcare professional if you do not hear anything within 24 hours of the test to find out your new dose or when to restart your prior dose. A special MedGuide will be given to you by the pharmacist with each prescription and refill. Be sure to read this information carefully each time. Talk to your pediatrician regarding the use of this medicine in children. Special care may be needed. Overdosage: If you think you have taken too much of this medicine contact a poison control center or emergency room at once. NOTE: This medicine is only for you. Do not share this medicine with others. What if I miss a dose? It is important not to miss a dose. If you miss a dose, call your healthcare provider. Take the dose as soon as possible on the same day. If it is almost time for your next dose, take only that dose. Do not take double or extra doses to make up for a missed dose. What may interact with this medicine? Do not take this medicine with any of the following medications: -agents that prevent or dissolve blood clots -aspirin or other salicylates -danshen -dextrothyroxine -mifepristone -St. John's Wort -red yeast rice This medicine may also interact with the following medications: -acetaminophen -agents that lower cholesterol -alcohol -allopurinol -amiodarone -antibiotics or medicines for treating bacterial,  fungal or viral infections -azathioprine -barbiturate medicines for inducing sleep or treating seizures -certain medicines for diabetes -certain medicines for heart rhythm problems -certain medicines for hepatitis C virus infections like daclatasvir, dasabuvir;  ombitasvir; paritaprevir; ritonavir, elbasvir; grazoprevir, ledipasvir; sofosbuvir, simeprevir, sofosbuvir, sofosbuvir; velpatasvir, sofosbuvir; velpatasvir; voxilaprevir -certain medicines for high blood pressure -chloral hydrate -cisapride -conivaptan -disulfiram -male hormones, including contraceptive or birth control pills -general anesthetics -herbal or dietary products like garlic, ginkgo, ginseng, green tea, or kava kava -influenza virus vaccine -male hormones -medicines for mental depression or psychosis -medicines for some types of cancer -medicines for stomach problems -methylphenidate -NSAIDs, medicines for pain and inflammation, like ibuprofen or naproxen -propoxyphene -quinidine, quinine -raloxifene -seizure or epilepsy medicine like carbamazepine, phenytoin, and valproic acid -steroids like cortisone and prednisone -tamoxifen -thyroid medicine -tramadol -vitamin c, vitamin e, and vitamin K -zafirlukast -zileuton This list may not describe all possible interactions. Give your health care provider a list of all the medicines, herbs, non-prescription drugs, or dietary supplements you use. Also tell them if you smoke, drink alcohol, or use illegal drugs. Some items may interact with your medicine. What should I watch for while using this medicine? Visit your doctor or health care professional for regular checks on your progress. You will need to have a blood test called a PT/INR regularly. The PT/INR blood test is done to make sure you are getting the right dose of this medicine. It is important to not miss your appointment for the blood tests. When you first start taking this medicine, these tests are done often. Once the correct dose is determined and you take your medicine properly, these tests can be done less often. Notify your doctor or health care professional and seek emergency treatment if you develop breathing problems; changes in vision; chest pain; severe, sudden  headache; pain, swelling, warmth in the leg; trouble speaking; sudden numbness or weakness of the face, arm or leg. These can be signs that your condition has gotten worse. While you are taking this medicine, carry an identification card with your name, the name and dose of medicine(s) being used, and the name and phone number of your doctor or health care professional or person to contact in an emergency. Do not start taking or stop taking any medicines or over-the-counter medicines except on the advice of your doctor or health care professional. You should discuss your diet with your doctor or health care professional. Do not make major changes in your diet. Vitamin K can affect how well this medicine works. Many foods contain vitamin K. It is important to eat a consistent amount of foods with vitamin K. Other foods with vitamin K that you should eat in consistent amounts are asparagus, basil, black eyed peas, broccoli, brussel sprouts, cabbage, green onions, green tea, parsley, green leafy vegetables like beet greens, collard greens, kale, spinach, turnip greens, or certain lettuces like green leaf or romaine. This medicine can cause birth defects or bleeding in an unborn child. Women of childbearing age should use effective birth control while taking this medicine. If a woman becomes pregnant while taking this medicine, she should discuss the potential risks and her options with her health care professional. Avoid sports and activities that might cause injury while you are using this medicine. Severe falls or injuries can cause unseen bleeding. Be careful when using sharp tools or knives. Consider using an Neurosurgeon. Take special care brushing or flossing your teeth. Report any injuries, bruising, or red spots on the skin to  your doctor or health care professional. If you have an illness that causes vomiting, diarrhea, or fever for more than a few days, contact your doctor. Also check with your doctor  if you are unable to eat for several days. These problems can change the effect of this medicine. Even after you stop taking this medicine, it takes several days before your body recovers its normal ability to clot blood. Ask your doctor or health care professional how long you need to be careful. If you are going to have surgery or dental work, tell your doctor or health care professional that you have been taking this medicine. What side effects may I notice from receiving this medicine? Side effects that you should report to your doctor or health care professional as soon as possible: -allergic reactions like skin rash, itching or hives, swelling of the face, lips, or tongue -breathing problems -chest pain -dizziness -headache -heavy menstrual bleeding or vaginal bleeding -pain in the lower back or side -painful, blue or purple toes -painful skin ulcers that do not go away -signs and symptoms of bleeding such as bloody or black, tarry stools; red or dark-brown urine; spitting up blood or brown material that looks like coffee grounds; red spots on the skin; unusual bruising or bleeding from the eye, gums, or nose -stomach pain -unusually weak or tired Side effects that usually do not require medical attention (report to your doctor or health care professional if they continue or are bothersome): -diarrhea -hair loss This list may not describe all possible side effects. Call your doctor for medical advice about side effects. You may report side effects to FDA at 1-800-FDA-1088. Where should I keep my medicine? Keep out of the reach of children. Store at room temperature between 15 and 30 degrees C (59 and 86 degrees F). Protect from light. Throw away any unused medicine after the expiration date. Do not flush down the toilet. NOTE: This sheet is a summary. It may not cover all possible information. If you have questions about this medicine, talk to your doctor, pharmacist, or health care  provider.  2018 Elsevier/[employer] Standard (2016-07-28 11:27:41)   Pain at the incision area that may last for several weeks.  Follow these instructions at home: Incision care  Follow instructions from your health care provider about how to take care of your incision. Make sure you: ? Wash your hands with soap and water before you change your bandage (dressing). If soap and water are not available, use hand sanitizer. ? Change your dressing as told by your health care provider. ? Leave stitches (sutures), skin glue, or adhesive strips in place. These skin closures may need to stay in place for 2 weeks or longer. If adhesive strip edges start to loosen and curl up, you may trim the loose edges. Do not remove adhesive strips completely unless your health care provider tells you to do that.  Check your incision area every day for signs of infection. Check for: ? More redness, swelling, or pain. ? More fluid or blood. ? Warmth. ? Pus or a bad smell.  Do not apply powder or lotion to the area. Driving  Do not drive until your health care provider approves.  Do not drive or use heavy machinery while taking prescription pain medicines. Bathing  Do not take baths, swim, or use a hot tub for 2-4 weeks after surgery, or until your health care provider approves. Ask your health care provider if you may take showers.  To  wash the incision site, gently wash with soap and water and pat the area dry with a clean towel. Do not rub the incision area. That may cause bleeding. Activity  Rest as told by your health care provider. Ask your health care provider when you can resume normal activities, including sexual activity.  Avoid the following activities for 6-8 weeks, or as long as directed: ? Lifting anything that is heavier than 10 lb (4.5 kg), or the limit that your health care provider tells you. ? Pushing or pulling things with your arms.  Avoid climbing stairs and using the handrail to pull  yourself up for the first 2-3 weeks after surgery.  Avoid airplane travel for 4-6 weeks, or as long as directed.  Avoid sitting for long periods of time and crossing your legs. Get up and move around at least once every 1-2 hours.  If you are taking blood thinners (anticoagulants), avoid activities that have a high risk of injury. Ask your health care provider what activities are safe for you. Lifestyle  Limit alcohol intake to no more than 1 drink a day for nonpregnant women and 2 drinks a day for men. One drink equals 12 oz of beer, 5 oz of wine, or 1 oz of hard liquor.  Do not use any products that contain nicotine or tobacco, such as cigarettes and e-cigarettes. If you need help quitting, ask your health care provider. General instructions  Take your temperature every day and weigh yourself every morning for the first 7 days after surgery. Write your temperatures and weight down and take this record with you to any follow-up visits.  Take over-the-counter and prescription medicines only as told by your health care provider.  To prevent or treat constipation while you are taking prescription pain medicine, your health care provider may recommend that you: ? Drink enough fluid to keep your urine clear or pale yellow. ? Take over-the-counter or prescription medicines. ? Eat foods that are high in fiber, such as fresh fruits and vegetables, whole grains, and beans. ? Limit foods that are high in fat and processed sugars, such as fried and sweet foods.  Follow instructions from your health care provider about eating or drinking restrictions.  Wear compression stockings for at least 2 weeks, or as long as told by your health care provider. These stockings help to prevent blood clots and reduce swelling in your legs. If your ankles are swollen after 2 weeks, continue to wear the stockings.  Keep all follow-up visits as told by your health care provider. This is important. Contact a health  care provider if:  You develop a skin rash.  Your weight is increasing each day over 2-3 days.  You gain 2 lb (1 kg) or more in a single day.  You have a fever. Get help right away if:  You develop chest pain that feels different from the pain caused by your incision.  You develop shortness of breath or difficulty breathing.  You have more redness, swelling, or pain around your incision.  You have more fluid or blood coming from your incision.  Your incision feels warm to the touch.  You have pus or a bad smell coming from your incision.  You feel light-headed. This information is not intended to replace advice given to you by your health care provider. Make sure you discuss any questions you have with your health care provider. Document Released: 02/25/2005 Document Revised: 05/20/2016 Document Reviewed: 05/20/2016 Elsevier Interactive Patient Education  2018 [employer].

## 2017-09-02 ENCOUNTER — Inpatient Hospital Stay (HOSPITAL_COMMUNITY): Payer: Medicaid Other

## 2017-09-02 LAB — GLUCOSE, CAPILLARY
Glucose-Capillary: 75 mg/dL (ref 65–99)
Glucose-Capillary: 181 mg/dL — ABNORMAL HIGH (ref 65–99)
Glucose-Capillary: 166 mg/dL — ABNORMAL HIGH (ref 65–99)
Glucose-Capillary: 192 mg/dL — ABNORMAL HIGH (ref 65–99)

## 2017-09-02 LAB — BASIC METABOLIC PANEL
Anion gap: 6 (ref 5–15)
BUN: 19 mg/dL (ref 6–20)
CO2: 27 mmol/L (ref 22–32)
Calcium: 8.1 mg/dL — ABNORMAL LOW (ref 8.9–10.3)
Chloride: 98 mmol/L — ABNORMAL LOW (ref 101–111)
Creatinine, Ser: 0.77 mg/dL (ref 0.61–1.24)
GFR calc Af Amer: >60 mL/min (ref >60)
GFR calc non Af Amer: >60 mL/min (ref >60)
Glucose, Bld: 61 mg/dL — ABNORMAL LOW (ref 65–99)
Potassium: 3.9 mmol/L (ref 3.5–5.1)
Sodium: 131 mmol/L — ABNORMAL LOW (ref 135–145)

## 2017-09-02 LAB — CBC
HCT: 27.6 % — ABNORMAL LOW (ref 39.0–52.0)
Hemoglobin: 8.3 g/dL — ABNORMAL LOW (ref 13.0–17.0)
MCH: 27.2 pg (ref 26.0–34.0)
MCHC: 30.1 g/dL (ref 30.0–36.0)
MCV: 90.5 fL (ref 78.0–100.0)
Platelets: 204 10*3/uL (ref 150–400)
RBC: 3.05 MIL/uL — ABNORMAL LOW (ref 4.22–5.81)
RDW: 16.3 % — ABNORMAL HIGH (ref 11.5–15.5)
WBC: 5.3 10*3/uL (ref 4.0–10.5)

## 2017-09-02 LAB — COOXEMETRY PANEL
Carboxyhemoglobin: 2 % — ABNORMAL HIGH (ref 0.5–1.5)
Methemoglobin: 1.3 % (ref 0.0–1.5)
O2 Saturation: 65 %
Total hemoglobin: 8.4 g/dL — ABNORMAL LOW (ref 12.0–16.0)

## 2017-09-02 LAB — PROTIME-INR
INR: 1.27
Prothrombin Time: 15.7 seconds — ABNORMAL HIGH (ref 11.4–15.2)

## 2017-09-02 MED ORDER — ASPIRIN EC 81 MG PO TBEC
81.0000 mg | DELAYED_RELEASE_TABLET | Freq: Every day | ORAL | Status: DC
  Administered 2017-09-02 – 2017-09-06 (×5): 81 mg via ORAL
  Filled 2017-09-02 (×5): qty 1

## 2017-09-02 MED ORDER — POTASSIUM CHLORIDE CRYS ER 20 MEQ PO TBCR
20.0000 meq | EXTENDED_RELEASE_TABLET | Freq: Once | ORAL | Status: AC
  Administered 2017-09-02: 20 meq via ORAL
  Filled 2017-09-02: qty 1

## 2017-09-02 MED ORDER — ASPIRIN 81 MG PO CHEW: 81.0000 mg | CHEWABLE_TABLET | Freq: Every day | ORAL | Status: DC

## 2017-09-02 MED ORDER — WARFARIN SODIUM 7.5 MG PO TABS
7.5000 mg | ORAL_TABLET | Freq: Once | ORAL | Status: AC
  Administered 2017-09-02: 7.5 mg via ORAL
  Filled 2017-09-02: qty 1

## 2017-09-02 NOTE — Progress Notes (Signed)
Patient walked for about 360 feet this morning and tolerated it well.

## 2017-09-02 NOTE — Evaluation (Signed)
Speech Language Pathology Evaluation Patient Details Name: Reginald [name] MRN: [id number] DOB: November 21, 1960 Today's Date: 09/02/2017 Time: 3244-0102 SLP Time Calculation (min) (ACUTE ONLY): 14 min  Problem List:  Patient Active Problem List   Diagnosis Date Noted  . S/P MVR (mitral valve replacement) 08/29/2017  . SOB (shortness of breath)   . Lethargy   . Benign essential HTN   . Vascular headache   . Acute blood loss anemia   . Hyponatremia   . Essential hypertension   . Type 2 diabetes mellitus with peripheral neuropathy (HCC)   . Chronic pain syndrome   . Septic embolism (HCC) 08/10/2017  . Acute osteomyelitis of cervical spine (HCC) 08/10/2017  . 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 Debility 08/10/2017  . Hypertensive heart disease with heart failure (HCC)   . Endotracheal tube present   . Acute respiratory disease   . 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 Acute respiratory failure (HCC) 07/26/2017  . Alcohol abuse 07/26/2017  . Thrombocytopenia (HCC) 07/26/2017  . Abnormal liver enzymes 07/26/2017  . Acute encephalopathy 07/26/2017  . Acute diastolic CHF (congestive heart failure) (HCC) 07/26/2017  . Sepsis (HCC)   . Endocarditis of mitral valve   . Cerebral septic emboli (HCC)   . MRSA bacteremia 07/25/2017   Past Medical History:  Past Medical History:  Diagnosis Date  . Acute osteomyelitis of cervical spine (HCC) 08/10/2017  . Alcohol abuse   . CAD (coronary artery disease)   . Diabetes mellitus type 2 in obese (HCC)   . Mitral regurgitation   . Neck pain   . 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 Tobacco abuse    Past Surgical History:  Past Surgical History:  Procedure Laterality Date  . HERNIA REPAIR  2007  . MITRAL VALVE REPLACEMENT N/A 08/29/2017   Procedure: MITRAL VALVE (MV) REPLACEMENT;  Surgeon: Kerin Perna, MD;  Location: [facility] OR;  Service: Open Heart Surgery;  Laterality: N/A;  . MULTIPLE EXTRACTIONS WITH ALVEOLOPLASTY N/A 08/07/2017   Procedure: Extraction of tooth #'s 6,11,12,14,19-29 and 32 with alveoloplasty;  Surgeon: Charlynne Pander, DDS;  Location: [facility] OR;  Service: Oral Surgery;  Laterality: N/A;  . RIGHT/LEFT HEART CATH AND CORONARY ANGIOGRAPHY N/A 08/02/2017   Procedure: RIGHT/LEFT HEART CATH AND CORONARY ANGIOGRAPHY;  Surgeon: Kathleene Hazel, MD;  Location: [facility] INVASIVE CV LAB;  Service: Cardiovascular;  Laterality: N/A;  . TEE WITHOUT CARDIOVERSION N/A 08/29/2017   Procedure: TRANSESOPHAGEAL ECHOCARDIOGRAM (TEE);  Surgeon: Donata Clay, Theron Arista, MD;  Location: [facility] OR;  Service: Open Heart Surgery;  Laterality: N/A;  . Tonsillectomy /adnoidectomy     as aa child   HPI:  [name] a 57 y.o.malewith PMH significant ofspinal osteomyelitis, alcohol abuse, DMII, paraspinal abscess, tobacco abuse, nonobstructive CAD, mitral regurgitation whohad very complicated admissionfrom 07/25/2017 to 08/10/2017 due to mitral valve large vegetation/endocarditis, possible aortic valve endocarditis, septic emboli to the brain, multifocal pneumonia, C1 osteomyelitis, C4 discitis and paraspinal abscess due to MRSA bacteremia.Total dental extraction this admission. Admitted back to hospital from CIR 08/20/17 with resp distress and edema. Acute DVT 1/7. S/p MVR 08/29/17; intubated 1/8-/9. CXR 1/10 Bibasilar atelectasis with small pleural effusions bilaterally. No consolidation. Stable cardiac prominence. There is aortic atherosclerosis.   Assessment / Plan / Recommendation Clinical Impression  Pt presents with persisting deficits in memory, higher level cognition including problem solving, reasoning, judgment and safety awareness. Pt alert and cooperative. Functional recall impaired as pt unable to recall his age or birth year, date of his wife's birthday (next date)  despite having a conversation with her about it this morning. Pt unable to problem solve to calculate age when told his birth year. Simple math also impaired 1/4. Recommend continued  SLP for cognitive deficits post d/c, HH SLP with 24 hour supervision. No further acute needs identified; SLP will s/o.    SLP Assessment  SLP Recommendation/Assessment: All further Speech Lanaguage Pathology  needs can be addressed in the next venue of care SLP Visit Diagnosis: Cognitive communication deficit (R41.841)    Follow Up Recommendations  Home health SLP;24 hour supervision/assistance    Frequency and Duration           SLP Evaluation Cognition  Overall Cognitive Status: Impaired/Different from baseline Arousal/Alertness: Awake/alert Orientation Level: Oriented to person;Oriented to place;Oriented to situation Attention: Selective Sustained Attention: Appears intact Selective Attention: Impaired Selective Attention Impairment: Verbal complex Memory: Impaired Memory Impairment: Decreased recall of new information;Retrieval deficit;Decreased short term memory Decreased Short Term Memory: Functional basic Awareness: Appears intact Problem Solving: Impaired Problem Solving Impairment: Functional complex Executive Function: Self Monitoring;Self Correcting Safety/Judgment: Impaired       Comprehension  Auditory Comprehension Overall Auditory Comprehension: Appears within functional limits for tasks assessed Visual Recognition/Discrimination Discrimination: Within Function Limits Reading Comprehension Reading Status: Not tested    Expression Expression Primary Mode of Expression: Verbal Verbal Expression Overall Verbal Expression: Appears within functional limits for tasks assessed Written Expression Dominant Hand: Right Written Expression: Not tested   Oral / Motor  Oral Motor/Sensory Function Overall Oral Motor/Sensory Function: Within functional limits Motor Speech Overall Motor Speech: Appears within functional limits for tasks assessed   GO                    Arlana LindauMary E Desiray Orchard 09/02/2017, 4:48 PM   Rondel BatonMary Beth Charlsie Fleeger, MS, CCC-SLP Speech-Language  Pathologist [phone]

## 2017-09-02 NOTE — Progress Notes (Addendum)
Cardiac Rehab [id number] Pt states that he has walked twice today independently denies any problems. Discussed Outpt. CRP with pt and wife. [facility]Discussed sternal precautions with pt and wife.We will continue to follow.

## 2017-09-02 NOTE — Progress Notes (Addendum)
      [address]       [place] [id number]             [phone]        4 Days Post-Op Procedure(s) (LRB): MITRAL VALVE (MV) REPLACEMENT (N/A) TRANSESOPHAGEAL ECHOCARDIOGRAM (TEE) (N/A)  Subjective: Patient has already walked two times. He has no specific complaints this am.  Objective: Vital signs in last 24 hours: Temp:  [98.3 F (36.8 C)-98.7 F (37.1 C)] 98.7 F (37.1 C) (01/12 0754) Pulse Rate:  [69-88] 88 (01/12 0754) Cardiac Rhythm: A-V Sequential paced (01/11 2259) Resp:  [17-24] 18 (01/12 0754) BP: (122-138)/(72-86) 128/72 (01/12 0754) SpO2:  [96 %-100 %] 100 % (01/12 0754) Weight:  [237 lb 3.2 oz (107.6 kg)] 237 lb 3.2 oz (107.6 kg) (01/12 0622)  Pre op weight  104.8 kg Current Weight  09/02/17 237 lb 3.2 oz (107.6 kg)      Intake/Output from previous day: 01/11 0701 - 01/12 0700 In: 740 [P.O.:720; I.V.:20] Out: 1475 [Urine:1425; Chest Tube:50]   Physical Exam:  Cardiovascular: RRR Pulmonary: Slightly diminished at bases bilaterally Abdomen: Soft, non tender, bowel sounds present. Extremities: Mild bilateral lower extremity edema. Wounds: Clean and dry.  No erythema or signs of infection.  Lab Results: CBC: Recent Labs    09/01/17 0415 09/02/17 0338  WBC 7.3 5.3  HGB 8.3* 8.3*  HCT 27.4* 27.6*  PLT 189 204   BMET:  Recent Labs    09/01/17 0415 09/02/17 0338  NA 133* 131*  K 3.4* 3.9  CL 95* 98*  CO2 30 27  GLUCOSE 70 61*  BUN 22* 19  CREATININE 0.86 0.77  CALCIUM 8.6* 8.1*    PT/INR:  Lab Results  Component Value Date   INR 1.27 09/02/2017   INR 1.36 09/01/2017   INR 1.34 08/31/2017   ABG:  INR: Will add last result for INR, ABG once components are confirmed Will add last 4 CBG results once components are confirmed  Assessment/Plan:  1. CV - Paced at 88. Has had CHB with bradycardia under external pacer. Hopefully, conduction system will recover;otherwise, will need PPM. On Spironolactone 12.5 mg  daily, Lisinopril 5 mg daily, and Coumadin. INR increased from 1.27 to 1.32. Will continue with Coumadin to 7.5 mg as has been given 2 doses of 5 without increase thus far. Has been off Milrinone drip since 08/31/2017 and co ox down to 52.2 this am.  2.  Pulmonary - On room air. Encourage incentive spirometer. 3. Volume Overload - On Lasix 40 mg orally bid 4.  Acute blood loss anemia - H and H stable at 8.7 and 29.4 this am. 6. ID-On Daptomycin. 7. DM-CBGs 166/192/82. On Insulin. Pre op HGA1C 5.8. 8. GI-on dysphagia 3, thin liquid diet as recommended by SLP. [facility].   Donielle M ZimmermanPA-C 09/02/2017,9:12 AM   I have seen and examined the patient and agree with the assessment and plan as outlined.  Back in NSR with 1st degree AV block this morning, HR 55-60.  Will place pacer on VVI backup and observe.  Purcell Nailslarence H Owen, MD 09/03/2017 11:07 AM

## 2017-09-03 LAB — COOXEMETRY PANEL
Carboxyhemoglobin: 1.8 % — ABNORMAL HIGH (ref 0.5–1.5)
Methemoglobin: 1 % (ref 0.0–1.5)
O2 Saturation: 52.2 %
Total hemoglobin: 10.5 g/dL — ABNORMAL LOW (ref 12.0–16.0)

## 2017-09-03 LAB — GLUCOSE, CAPILLARY
Glucose-Capillary: 82 mg/dL (ref 65–99)
Glucose-Capillary: 98 mg/dL (ref 65–99)
Glucose-Capillary: 121 mg/dL — ABNORMAL HIGH (ref 65–99)
Glucose-Capillary: 142 mg/dL — ABNORMAL HIGH (ref 65–99)

## 2017-09-03 LAB — BASIC METABOLIC PANEL
Anion gap: 8 (ref 5–15)
BUN: 8 mg/dL (ref 6–20)
CO2: 29 mmol/L (ref 22–32)
Calcium: 8.3 mg/dL — ABNORMAL LOW (ref 8.9–10.3)
Chloride: 98 mmol/L — ABNORMAL LOW (ref 101–111)
Creatinine, Ser: 0.62 mg/dL (ref 0.61–1.24)
GFR calc Af Amer: >60 mL/min (ref >60)
GFR calc non Af Amer: >60 mL/min (ref >60)
Glucose, Bld: 77 mg/dL (ref 65–99)
Potassium: 4.5 mmol/L (ref 3.5–5.1)
Sodium: 135 mmol/L (ref 135–145)

## 2017-09-03 LAB — CBC
HCT: 29.4 % — ABNORMAL LOW (ref 39.0–52.0)
Hemoglobin: 8.7 g/dL — ABNORMAL LOW (ref 13.0–17.0)
MCH: 27 pg (ref 26.0–34.0)
MCHC: 29.6 g/dL — ABNORMAL LOW (ref 30.0–36.0)
MCV: 91.3 fL (ref 78.0–100.0)
Platelets: 235 10*3/uL (ref 150–400)
RBC: 3.22 MIL/uL — ABNORMAL LOW (ref 4.22–5.81)
RDW: 16.1 % — ABNORMAL HIGH (ref 11.5–15.5)
WBC: 4.5 10*3/uL (ref 4.0–10.5)

## 2017-09-03 LAB — AEROBIC/ANAEROBIC CULTURE W GRAM STAIN (SURGICAL/DEEP WOUND)
Culture: NO GROWTH
Gram Stain: NONE SEEN

## 2017-09-03 LAB — PROTIME-INR
INR: 1.32
Prothrombin Time: 16.3 seconds — ABNORMAL HIGH (ref 11.4–15.2)

## 2017-09-03 MED ORDER — WARFARIN SODIUM 7.5 MG PO TABS
7.5000 mg | ORAL_TABLET | Freq: Once | ORAL | Status: AC
  Administered 2017-09-03: 7.5 mg via ORAL
  Filled 2017-09-03: qty 1

## 2017-09-03 NOTE — Progress Notes (Signed)
OT Cancellation Note  Patient Details Name: Reginald [name] MRN: [id number] DOB: 07/28/1961   Cancelled Treatment:    Reason Eval/Treat Not Completed: Patient at procedure or test/ unavailable. OT will continue to follow for evaluation as schedule allows.   Evern BioLaura J Makylah Bossard 09/03/2017, 4:05 PM  Sherryl MangesLaura Brysten Reister OTR/L [phone]

## 2017-09-04 LAB — BASIC METABOLIC PANEL
Anion gap: 8 (ref 5–15)
BUN: 12 mg/dL (ref 6–20)
CO2: 29 mmol/L (ref 22–32)
Calcium: 8.4 mg/dL — ABNORMAL LOW (ref 8.9–10.3)
Chloride: 96 mmol/L — ABNORMAL LOW (ref 101–111)
Creatinine, Ser: 0.64 mg/dL (ref 0.61–1.24)
GFR calc Af Amer: >60 mL/min (ref >60)
GFR calc non Af Amer: >60 mL/min (ref >60)
Glucose, Bld: 132 mg/dL — ABNORMAL HIGH (ref 65–99)
Potassium: 4.4 mmol/L (ref 3.5–5.1)
Sodium: 133 mmol/L — ABNORMAL LOW (ref 135–145)

## 2017-09-04 LAB — COOXEMETRY PANEL
Carboxyhemoglobin: 1.8 % — ABNORMAL HIGH (ref 0.5–1.5)
Methemoglobin: 1.1 % (ref 0.0–1.5)
O2 Saturation: 51.5 %
Total hemoglobin: 10.1 g/dL — ABNORMAL LOW (ref 12.0–16.0)

## 2017-09-04 LAB — CBC
HCT: 28 % — ABNORMAL LOW (ref 39.0–52.0)
Hemoglobin: 8.5 g/dL — ABNORMAL LOW (ref 13.0–17.0)
MCH: 27.9 pg (ref 26.0–34.0)
MCHC: 30.4 g/dL (ref 30.0–36.0)
MCV: 91.8 fL (ref 78.0–100.0)
Platelets: 234 10*3/uL (ref 150–400)
RBC: 3.05 MIL/uL — ABNORMAL LOW (ref 4.22–5.81)
RDW: 16.4 % — ABNORMAL HIGH (ref 11.5–15.5)
WBC: 5 10*3/uL (ref 4.0–10.5)

## 2017-09-04 LAB — ACID FAST SMEAR (AFB, MYCOBACTERIA): Acid Fast Smear: NEGATIVE

## 2017-09-04 LAB — PROTIME-INR
INR: 1.68
Prothrombin Time: 19.6 seconds — ABNORMAL HIGH (ref 11.4–15.2)

## 2017-09-04 LAB — GLUCOSE, CAPILLARY
Glucose-Capillary: 94 mg/dL (ref 65–99)
Glucose-Capillary: 103 mg/dL — ABNORMAL HIGH (ref 65–99)
Glucose-Capillary: 116 mg/dL — ABNORMAL HIGH (ref 65–99)
Glucose-Capillary: 114 mg/dL — ABNORMAL HIGH (ref 65–99)

## 2017-09-04 MED ORDER — WARFARIN SODIUM 7.5 MG PO TABS
7.5000 mg | ORAL_TABLET | Freq: Once | ORAL | Status: AC
  Administered 2017-09-04: 7.5 mg via ORAL
  Filled 2017-09-04: qty 1

## 2017-09-04 MED ORDER — FUROSEMIDE 20 MG PO TABS
60.0000 mg | ORAL_TABLET | Freq: Two times a day (BID) | ORAL | Status: DC
  Administered 2017-09-04 – 2017-09-06 (×4): 60 mg via ORAL
  Filled 2017-09-04 (×4): qty 1

## 2017-09-04 NOTE — Evaluation (Signed)
Occupational Therapy Evaluation Patient Details Name: Reginald [name] MRN: [id number] DOB: Sep 20, 1960 Today's Date: 09/04/2017    History of Present Illness [name] a 57 y.o.malewith PMH significant ofspinal osteomyelitis, alcohol abuse, DMII, paraspinal abscess, tobacco abuse, nonobstructive CAD, mitral regurgitation whohad very complicated admissionfrom 07/25/2017 to 08/10/2017 due to mitral valve large vegetation/endocarditis, possible aortic valve endocarditis, septic emboli to the brain, multifocal pneumonia, C1 osteomyelitis, C4 discitis and paraspinal abscess due to MRSA bacteremia.Admitted back to hospital from [facility] 08/20/17 with resp distress and edema. Acute DVT 1/7. S/p MVR 08/29/17; intub 1/8 for sx, with successful extub 1/9.   Clinical Impression   Pt is able to perform self care with min assist to supervision. Educated pt and caregiver in multiple uses of 3 in 1 and sternal precautions related to ADL and IADL. Pt with impaired memory and some sequencing, slow mentation. Will follow acutely.    Follow Up Recommendations  Home health OT;Supervision/Assistance - 24 hour    Equipment Recommendations  3 in 1 bedside commode    Recommendations for Other Services       Precautions / Restrictions Precautions Precautions: Fall;Sternal Restrictions Weight Bearing Restrictions: Yes(midsternal incision)      Mobility Bed Mobility               General bed mobility comments: pt in chair  Transfers Overall transfer level: Needs assistance Equipment used: Rolling walker (2 wheeled) Transfers: Sit to/from Stand Sit to Stand: Min guard         General transfer comment: steadying assist from chair, hands on knees using momentum    Balance Overall balance assessment: Needs assistance   Sitting balance-Leahy Scale: Good       Standing balance-Leahy Scale: Fair Standing balance comment: can release walker in static standing                            ADL either performed or assessed with clinical judgement   ADL Overall ADL's : Needs assistance/impaired Eating/Feeding: Independent;Sitting   Grooming: Wash/dry hands;Standing;Supervision/safety Grooming Details (indicate cue type and reason): able to release walker in standing Upper Body Bathing: Minimal assistance;Sitting Upper Body Bathing Details (indicate cue type and reason): assist for back Lower Body Bathing: Supervison/ safety;Sit to/from stand   Upper Body Dressing : Set up;Sitting   Lower Body Dressing: Supervision/safety;Sit to/from stand   Toilet Transfer: Supervision/safety;Ambulation;BSC;RW Toilet Transfer Details (indicate cue type and reason): educated in use of 3 in 1 over toilet Toileting- Clothing Manipulation and Hygiene: Supervision/safety;Sit to/from Nurse, children's Details (indicate cue type and reason): educated in use of 3 in 1 as shower seat Functional mobility during ADLs: Supervision/safety;Rolling walker General ADL Comments: instructed in sternal precautions related to ADL and IADL     Vision Baseline Vision/History: Wears glasses Wears Glasses: Reading only Patient Visual Report: No change from baseline       Perception     Praxis      Pertinent Vitals/Pain Pain Assessment: No/denies pain     Hand Dominance Right   Extremity/Trunk Assessment Upper Extremity Assessment Upper Extremity Assessment: Overall WFL for tasks assessed(not formally assessed due to sternal precautions)   Lower Extremity Assessment Lower Extremity Assessment: Defer to PT evaluation   Cervical / Trunk Assessment Cervical / Trunk Assessment: Normal   Communication Communication Communication: No difficulties   Cognition Arousal/Alertness: Awake/alert Behavior During Therapy: WFL for tasks assessed/performed Overall Cognitive Status: Impaired/Different from baseline Area of Impairment: Memory;Problem  solving                      Memory: Decreased short-term memory   Safety/Judgement: Decreased awareness of deficits   Problem Solving: Slow processing;Requires verbal cues General Comments: difficulty relaying home set up   General Comments       Exercises     Shoulder Instructions      Home Living Family/patient expects to be discharged to:: Private residence Living Arrangements: Alone Available Help at Discharge: Family;Friend(s);Available 24 hours/day Type of Home: Mobile home Home Access: Stairs to enter Entrance Stairs-Number of Steps: 2   Home Layout: One level     Bathroom Shower/Tub: Chief Strategy OfficerTub/shower unit   Bathroom Toilet: Standard     Home Equipment: None      Lives With: Alone    Prior Functioning/Environment Level of Independence: Independent        Comments: works as a Occupational hygienisthandiman        OT Problem List: Decreased strength;Decreased activity tolerance;Impaired balance (sitting and/or standing);Decreased knowledge of use of DME or AE;Decreased cognition      OT Treatment/Interventions: Self-care/ADL training;DME and/or AE instruction;Therapeutic activities;Therapeutic exercise;Patient/family education    OT Goals(Current goals can be found in the care plan section) Acute Rehab OT Goals Patient Stated Goal: go home OT Goal Formulation: With patient Time For Goal Achievement: 09/18/17 Potential to Achieve Goals: Good ADL Goals Pt Will Transfer to Toilet: with modified independence;ambulating;bedside commode(over toilet) Pt Will Perform Toileting - Clothing Manipulation and hygiene: with modified independence;sit to/from stand Additional ADL Goal #1: Pt will generalize sternal precautions in ADL and mobility. Additional ADL Goal #2: Pt will identify and gather items necessary for ADL modified independently.  OT Frequency: Min 2X/week   Barriers to D/C:            Co-evaluation              AM-PAC PT "6 Clicks" Daily Activity     Outcome Measure Help from another  person eating meals?: None Help from another person taking care of personal grooming?: A Little Help from another person toileting, which includes using toliet, bedpan, or urinal?: A Little Help from another person bathing (including washing, rinsing, drying)?: A Little Help from another person to put on and taking off regular upper body clothing?: None Help from another person to put on and taking off regular lower body clothing?: A Little 6 Click Score: 20   End of Session Equipment Utilized During Treatment: Rolling walker;Gait belt  Activity Tolerance: Patient tolerated treatment well Patient left: in chair;with call bell/phone within reach;with family/visitor present  OT Visit Diagnosis: Unsteadiness on feet (R26.81);Muscle weakness (generalized) (M62.81);Other symptoms and signs involving cognitive function                Time: 1130-1149 OT Time Calculation (min): 19 min Charges:  OT General Charges $OT Visit: 1 Visit OT Evaluation $OT Eval Moderate Complexity: 1 Mod G-Codes:     09/04/2017 Reginald [name], OTR/L Pager: [phone]  Iran PlanasMayberry, [place] BailiffJulie Lynn 09/04/2017, 1:02 PM

## 2017-09-04 NOTE — Progress Notes (Signed)
Patient ID: Reginald [name], male   DOB: January 29, 1961, 57 y.o.   MRN: [id number]     Advanced Heart Failure Rounding Note  HF Cardiology: Shirlee Latch  Subjective:    MV replacement with bioprosthetic valve 08/29/17.   Coox 51%. Not on milrinone. No CVP connected.  Milrinone stopped 08/31/17.  Feeling good this am. Denies SOB. Mild swelling in both ankles that he feels is increasing. Hopeful to go home in next day or two.  Pacing box removed but wires remain in place. Rates in 70s currently.   MRI spine 08/24/17 with continued evidence of Osteomyelitis and joint infection in C1-C2, but positive response to treatment. Less regional edema and diminishing paraspinal fluid collections.  Less edema at C4-C5 level.  Objective:   Weight Range: 238 lb 1.6 oz (108 kg) Body mass index is 35.16 kg/m.   Vital Signs:   Temp:  [97.6 F (36.4 C)-98.2 F (36.8 C)] 97.7 F (36.5 C) (01/14 0405) Pulse Rate:  [72-77] 72 (01/14 0954) Resp:  [18-22] 18 (01/14 0954) BP: (111-121)/(67-77) 111/77 (01/14 0405) SpO2:  [94 %-98 %] 94 % (01/14 0954) Weight:  [238 lb 1.6 oz (108 kg)] 238 lb 1.6 oz (108 kg) (01/14 0426) Last BM Date: 09/01/17  Weight change: Filed Weights   09/02/17 0622 09/03/17 0453 09/04/17 0426  Weight: 237 lb 3.2 oz (107.6 kg) 238 lb 4.8 oz (108.1 kg) 238 lb 1.6 oz (108 kg)    Intake/Output:   Intake/Output Summary (Last 24 hours) at 09/04/2017 1032 Last data filed at 09/04/2017 0407 Gross per 24 hour  Intake 840 ml  Output 1030 ml  Net -190 ml    Physical Exam   General: NAD. HEENT: Normal Neck: Supple. JVP difficult, appears at least 7-8 cm. Carotids 2+ bilat; no bruits. No thyromegaly or nodule noted. Cor: PMI nondisplaced. RRR, No M/G/R noted Lungs: CTAB, normal effort. Abdomen: Soft, non-tender, non-distended, no HSM. No bruits or masses. +BS  Extremities: No cyanosis, clubbing, or rash. RUE PICC. Neuro: Alert & orientedx3, cranial nerves grossly intact. moves all 4  extremities w/o difficulty. Affect pleasant   Telemetry   NSR 60-70s, personally reviewed.   Labs    CBC Recent Labs    09/03/17 0443 09/04/17 0414  WBC 4.5 5.0  HGB 8.7* 8.5*  HCT 29.4* 28.0*  MCV 91.3 91.8  PLT 235 234   Basic Metabolic Panel Recent Labs    40/98/11 0443 09/04/17 0414  NA 135 133*  K 4.5 4.4  CL 98* 96*  CO2 29 29  GLUCOSE 77 132*  BUN 8 12  CREATININE 0.62 0.64  CALCIUM 8.3* 8.4*   Liver Function Tests No results for input(s): AST, ALT, ALKPHOS, BILITOT, PROT, ALBUMIN in the last 72 hours. No results for input(s): LIPASE, AMYLASE in the last 72 hours. Cardiac Enzymes No results for input(s): CKTOTAL, CKMB, CKMBINDEX, TROPONINI in the last 72 hours.  BNP: BNP (last 3 results) Recent Labs    07/26/17 0340 08/19/17 2300  BNP 104.6* 204.8*    ProBNP (last 3 results) No results for input(s): PROBNP in the last 8760 hours.   D-Dimer No results for input(s): DDIMER in the last 72 hours. Hemoglobin A1C No results for input(s): HGBA1C in the last 72 hours. Fasting Lipid Panel No results for input(s): CHOL, HDL, LDLCALC, TRIG, CHOLHDL, LDLDIRECT in the last 72 hours. Thyroid Function Tests No results for input(s): TSH, T4TOTAL, T3FREE, THYROIDAB in the last 72 hours.  Invalid input(s): FREET3  Other results:  Imaging    No results found.   Medications:     Scheduled Medications: . aspirin EC  81 mg Oral Daily  . chlorhexidine  15 mL Mouth Rinse BID  . Chlorhexidine Gluconate Cloth  6 each Topical Daily  . enoxaparin (LOVENOX) injection  40 mg Subcutaneous Q24H  . furosemide  40 mg Oral BID  . insulin aspart  0-24 Units Subcutaneous TID AC & HS  . insulin detemir  15 Units Subcutaneous BID  . lisinopril  5 mg Oral Daily  . mometasone-formoterol  2 puff Inhalation BID  . pantoprazole  40 mg Oral Daily  . sodium chloride flush  10-40 mL Intracatheter Q12H  . sodium chloride flush  3 mL Intravenous Q12H  . spironolactone   12.5 mg Oral Daily  . thiamine  100 mg Oral Daily  . warfarin  7.5 mg Oral ONCE-1800  . Warfarin - Physician Dosing Inpatient   Does not apply q1800    Infusions: . sodium chloride Stopped (08/30/17 0700)  . sodium chloride    . DAPTOmycin (CUBICIN)  IV Stopped (09/03/17 1903)    PRN Medications: sodium chloride, guaiFENesin, levalbuterol, magnesium hydroxide, ondansetron (ZOFRAN) IV, oxyCODONE, sodium chloride flush, sodium chloride flush, sorbitol, traMADol    Patient Profile   Delta Deshmukh is a 57 y.o. 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   Admitted back to hospital from [facility] 08/20/17 with resp distress and edema  Bioprosthetic MVR 08/29/17.   Assessment/Plan   1. Acute on chronic diastolic CHF: Now s/p MV replacement.   - CO-OX 51.5% this am.  - CVP not connected. Will check CVP and to help decide on final diuretic dose.  - Will increase lasix to 60 mg BID with at least mild volume overload. ( Was taking 40 mg TID at home).  - Continue 12.5 mg spiro daily.   - Reinforced fluid restriction to < 2 L daily, sodium restriction to less than 2000 mg daily, and the importance of daily weights.   2. 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  Last blood cultures 12/25 NGTD. He is on daptomycin.  Now s/p MV replacement with bioprosthetic valve.   - Stable.  3. Anemia: Suspect anemia of chronic disease, got 1 unit PRBCs 12/31 1/9 - Hgb 8.5 - FOBT was recently negative.  4. DVT: Left PT vein (below knee). Will need 3 months anticoagulation for this.    - INR 1.68.  Discussed with Pharm-D. TCTS dosing.  Pharmacy to follow up with Dr Maren Beach about DVT treatment.  5. Rhythm:  - Junctional rhythm post-op, no atrial fibrillation.  - NSR this am in 60-70s. Continue to follow.   Graciella Freer, PA-C  09/04/2017   Advanced Heart Failure Team Pager [phone] (M-F; 7a - 4p)  Please contact [facility] for night-coverage after hours (4p -7a ) and weekends on [email or web address]  Patient seen with PA, agree with the above note.  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  Walking in hall without dyspnea.  On exam, no JVD but still with lower leg edema.   Agree with increasing Lasix to 60 mg bid.  Will add Ted hose.  Marca AnconaDalton Jacquelin Krajewski 09/04/2017 1:30 PM

## 2017-09-04 NOTE — Progress Notes (Signed)
CVP monitoring was done as ordered per MD, q.shift.  Patient requested CVP monitoring line be removed when not in use so as not to hinder his ambulating about the room.

## 2017-09-04 NOTE — Progress Notes (Signed)
Physical Therapy Treatment Patient Details Name: [name] Maxim MRN: [id number] DOB: 05/06/1961 Today's Date: 09/04/2017    History of Present Illness [name] a 57 y.o.malewith PMH significant ofspinal osteomyelitis, alcohol abuse, DMII, paraspinal abscess, tobacco abuse, nonobstructive CAD, mitral regurgitation whohad very complicated admissionfrom 07/25/2017 to 08/10/2017 due to mitral valve large vegetation/endocarditis, possible aortic valve endocarditis, septic emboli to the brain, multifocal pneumonia, C1 osteomyelitis, C4 discitis and paraspinal abscess due to MRSA bacteremia.Admitted back to hospital from CIR 08/20/17 with resp distress and edema. Acute DVT 1/7. S/p MVR 08/29/17; intub 1/8 for sx, with successful extub 1/9.    PT Comments    Pt and fiancee report that he has been up walking 4x per day with out O2. Pt ambulated with therapy on RA. SpO2 dropped to 87% and remained there. Cues for pursed lipped breathing. Pt had no c/o dizziness. Good technique for transferring while maintaining sternal precautions. Pt would benefit from continued skilled PT to increase activity tolerance and safety with mobility. Will continue to follow acutely.   Follow Up Recommendations  Home health PT;Supervision/Assistance - 24 hour     Equipment Recommendations  Rolling walker with 5" wheels    Recommendations for Other Services OT consult     Precautions / Restrictions Precautions Precautions: Fall;Sternal Precaution Comments: debility, watch O2 sats Restrictions Weight Bearing Restrictions: Yes(midsternal incision) Other Position/Activity Restrictions: Sternal    Mobility  Bed Mobility               General bed mobility comments: in chair on arrival  Transfers Overall transfer level: Needs assistance Equipment used: Rolling walker (2 wheeled) Transfers: Sit to/from Stand Sit to Stand: Min assist         General transfer comment: min A to power up into standing, Hand  on knees and use of momentum  Ambulation/Gait Ambulation/Gait assistance: Min guard Ambulation Distance (Feet): 200 Feet Assistive device: Rolling walker (2 wheeled) Gait Pattern/deviations: Step-through pattern;Decreased stride length;Trunk flexed Gait velocity: Decreased Gait velocity interpretation: Below normal speed for age/gender General Gait Details: Pt with slow guarded gait. O2 dropped to 87% during ambulation. Cues for pursed lipped breathing.   Stairs            Wheelchair Mobility    Modified Rankin (Stroke Patients Only) Modified Rankin (Stroke Patients Only) Pre-Morbid Rankin Score: No symptoms Modified Rankin: Moderately severe disability     Balance Overall balance assessment: Needs assistance Sitting-balance support: Feet supported;No upper extremity supported Sitting balance-Leahy Scale: Good     Standing balance support: Bilateral upper extremity supported;During functional activity Standing balance-Leahy Scale: Fair Standing balance comment: can release walker in static standing                            Cognition Arousal/Alertness: Awake/alert Behavior During Therapy: WFL for tasks assessed/performed Overall Cognitive Status: Within Functional Limits for tasks assessed Area of Impairment: Memory;Problem solving                     Memory: Decreased short-term memory   Safety/Judgement: Decreased awareness of deficits   Problem Solving: Slow processing;Requires verbal cues General Comments: difficulty relaying home set up      Exercises      General Comments        Pertinent Vitals/Pain Pain Assessment: No/denies pain    Home Living Family/patient expects to be discharged to:: Private residence Living Arrangements: Alone Available Help at Discharge: Family;Friend(s);Available 24 hours/day Type of  Home: Mobile home Home Access: Stairs to enter   Home Layout: One level Home Equipment: None      Prior  Function Level of Independence: Independent      Comments: works as a Optician, dispensing (current goals can now be found in the care plan section) Acute Rehab PT Goals Patient Stated Goal: go home PT Goal Formulation: With patient Time For Goal Achievement: 09/14/17 Potential to Achieve Goals: Good Progress towards PT goals: Progressing toward goals    Frequency    Min 3X/week      PT Plan Current plan remains appropriate    Co-evaluation              AM-PAC PT "6 Clicks" Daily Activity  Outcome Measure  Difficulty turning over in bed (including adjusting bedclothes, sheets and blankets)?: None Difficulty moving from lying on back to sitting on the side of the bed? : Unable Difficulty sitting down on and standing up from a chair with arms (e.g., wheelchair, bedside commode, etc,.)?: Unable Help needed moving to and from a bed to chair (including a wheelchair)?: A Little Help needed walking in hospital room?: A Little Help needed climbing 3-5 steps with a railing? : A Lot 6 Click Score: 14    End of Session Equipment Utilized During Treatment: Gait belt Activity Tolerance: Other (comment)(SpO2 dropped to 87%) Patient left: in chair;with call bell/phone within reach;with family/visitor present Nurse Communication: Mobility status PT Visit Diagnosis: Unsteadiness on feet (R26.81);Other abnormalities of gait and mobility (R26.89);Muscle weakness (generalized) (M62.81)     Time: 4742-5956 PT Time Calculation (min) (ACUTE ONLY): 22 min  Charges:  $Gait Training: 8-22 mins                    G Codes:       Kallie Locks, [place] Pager [phone] Acute Rehab  Sheral Apley 09/04/2017, 3:14 PM

## 2017-09-04 NOTE — Progress Notes (Signed)
CARDIAC REHAB PHASE I   PRE:  Rate/Rhythm: 79 SR 1HB  BP:  Supine:   Sitting: 117/80  Standing:    SaO2: 95%RA  MODE:  Ambulation: 610 ft   POST:  Rate/Rhythm: 88 SR 1HB  BP:  Supine:   Sitting: 130/82  Standing:    SaO2: 91%RA hall and 89-90% RA rooom [id number] Pt walked 610 ft on RA with rolling walker independently and I managed pacemaker and walked beside pt in case needed. He stopped twice to rest but tolerated well. Encouraged pt of sternal precautions. He can stand independently without use of arms. To recliner after walk. Requested pain med and I notified RN.   Luetta Nuttingharlene Rayonna Heldman, RN BSN  09/04/2017 9:28 AM

## 2017-09-04 NOTE — Progress Notes (Addendum)
      [address]       [place] [id number]             [phone]      6 Days Post-Op Procedure(s) (LRB): MITRAL VALVE (MV) REPLACEMENT (N/A) TRANSESOPHAGEAL ECHOCARDIOGRAM (TEE) (N/A)   Subjective:  Feeling pretty good.  Up in chair eating breakfast this morning.  Objective: Vital signs in last 24 hours: Temp:  [97.6 F (36.4 C)-98.5 F (36.9 C)] 97.7 F (36.5 C) (01/14 0405) Pulse Rate:  [72-88] 76 (01/13 2020) Cardiac Rhythm: Ventricular paced (01/13 2201) Resp:  [18-22] 18 (01/13 2020) BP: (111-138)/(67-88) 111/77 (01/14 0405) SpO2:  [94 %-98 %] 98 % (01/13 2020) Weight:  [238 lb 1.6 oz (108 kg)] 238 lb 1.6 oz (108 kg) (01/14 0426)  Intake/Output from previous day: 01/13 0701 - 01/14 0700 In: 1080 [P.O.:1080] Out: 1430 [Urine:1430]  General appearance: alert, cooperative and no distress Heart: regular rate and rhythm Lungs: diminished breath sounds bibasilar Abdomen: soft, non-tender; bowel sounds normal; no masses,  no organomegaly Extremities: edema 1+ Wound: clean and dry  Lab Results: Recent Labs    09/03/17 0443 09/04/17 0414  WBC 4.5 5.0  HGB 8.7* 8.5*  HCT 29.4* 28.0*  PLT 235 234   BMET:  Recent Labs    09/03/17 0443 09/04/17 0414  NA 135 133*  K 4.5 4.4  CL 98* 96*  CO2 29 29  GLUCOSE 77 132*  BUN 8 12  CREATININE 0.62 0.64  CALCIUM 8.3* 8.4*    PT/INR:  Recent Labs    09/04/17 0414  LABPROT 19.6*  INR 1.68   ABG    Component Value Date/Time   PHART 7.417 08/30/2017 1008   HCO3 28.1 (H) 08/30/2017 1008   TCO2 29 08/31/2017 1706   O2SAT 51.5 09/04/2017 0435   CBG (last 3)  Recent Labs    09/03/17 1621 09/03/17 2022 09/04/17 0623  GLUCAP 121* 142* 94    Assessment/Plan: S/P Procedure(s) (LRB): MITRAL VALVE (MV) REPLACEMENT (N/A) TRANSESOPHAGEAL ECHOCARDIOGRAM (TEE) (N/A)  1. CV- NSR with 1st degree AV Block- not on BB, continue Lisinopril for BP control 2. INR 1.68, on 7.5 mg of coumadin  daily 3. Pulm - no acute issues, continue IS 4. Renal- creatinine WNL, weight is slowly trending up despite Lasix, and Spironolactone, K is WNL 5. Dysphagia- SLP following, H/H ordered 6. DM- sugars controlled 7. Dispo- patient stable, NSR with 1st degree AV Block, continue coumadin, per Dr. Donata ClayVan Trigt will tape wires to skin, remove in AM if rhythm is stable, continue diuretics   LOS: 15 days    Lowella Dandyrin Barrett 09/04/2017 Patient examined and heart rhythm strips personally reviewed Currently maintaining sinus rhythm after carvedilol was stopped Patient will need to complete a 2 week course of IV daptomycin for MRSA endocarditis and a prosthetic MVR. PICC line is in place. DC epicardial wires tomorrow if no more bradycardic arrhythmia. \ patient examined and medical record reviewed,agree with above note. Kathlee Nationseter Van Trigt III 09/04/2017

## 2017-09-05 LAB — PROTIME-INR
INR: 1.71
Prothrombin Time: 19.9 seconds — ABNORMAL HIGH (ref 11.4–15.2)

## 2017-09-05 LAB — BASIC METABOLIC PANEL
ANION GAP: 10 (ref 5–15)
BUN: 13 mg/dL (ref 6–20)
CALCIUM: 8.5 mg/dL — AB (ref 8.9–10.3)
CHLORIDE: 94 mmol/L — AB (ref 101–111)
CO2: 29 mmol/L (ref 22–32)
CREATININE: 0.74 mg/dL (ref 0.61–1.24)
GFR calc non Af Amer: >60 mL/min (ref >60)
Glucose, Bld: 107 mg/dL — ABNORMAL HIGH (ref 65–99)
Potassium: 4.4 mmol/L (ref 3.5–5.1)
SODIUM: 133 mmol/L — AB (ref 135–145)

## 2017-09-05 LAB — GLUCOSE, CAPILLARY
Glucose-Capillary: 100 mg/dL — ABNORMAL HIGH (ref 65–99)
Glucose-Capillary: 162 mg/dL — ABNORMAL HIGH (ref 65–99)
Glucose-Capillary: 125 mg/dL — ABNORMAL HIGH (ref 65–99)
Glucose-Capillary: 174 mg/dL — ABNORMAL HIGH (ref 65–99)

## 2017-09-05 LAB — CBC
HCT: 27.1 % — ABNORMAL LOW (ref 39.0–52.0)
Hemoglobin: 8.4 g/dL — ABNORMAL LOW (ref 13.0–17.0)
MCH: 27.8 pg (ref 26.0–34.0)
MCHC: 31 g/dL (ref 30.0–36.0)
MCV: 89.7 fL (ref 78.0–100.0)
Platelets: 234 10*3/uL (ref 150–400)
RBC: 3.02 MIL/uL — ABNORMAL LOW (ref 4.22–5.81)
RDW: 16.1 % — ABNORMAL HIGH (ref 11.5–15.5)
WBC: 5.7 10*3/uL (ref 4.0–10.5)

## 2017-09-05 LAB — CK: Total CK: 17 U/L — ABNORMAL LOW (ref 49–397)

## 2017-09-05 MED ORDER — SPIRONOLACTONE 25 MG PO TABS
25.0000 mg | ORAL_TABLET | Freq: Every day | ORAL | Status: DC
  Administered 2017-09-06: 25 mg via ORAL
  Filled 2017-09-05: qty 1

## 2017-09-05 MED ORDER — SALINE SPRAY 0.65 % NA SOLN
1.0000 | NASAL | Status: DC | PRN
  Filled 2017-09-05: qty 44

## 2017-09-05 NOTE — Progress Notes (Signed)
PHARMACY CONSULT NOTE FOR:  OUTPATIENT  PARENTERAL ANTIBIOTIC THERAPY (OPAT)  Indication: Endocarditis Regimen: Daptomycin 1g IV q 24 hrs End date: Feb 19th, 2019  IV antibiotic discharge orders are pended. To discharging provider:  please sign these orders via discharge navigator,  Select New Orders & click on the button choice - Manage This Unsigned Work.     Thank you for allowing pharmacy to be a part of this patient's care.  Tad MooreJessica Lasheena Frieze, Pharm D, BCPS  Clinical Pharmacist Pager [phone]  09/05/2017 12:04 PM

## 2017-09-05 NOTE — Progress Notes (Signed)
      [address]       [place], [id number]             [phone]      7 Days Post-Op Procedure(s) (LRB): MITRAL VALVE (MV) REPLACEMENT (N/A) TRANSESOPHAGEAL ECHOCARDIOGRAM (TEE) (N/A) Subjective: Feels okay this morning. He is wanting to go home. Is having some neck and shoulder pain.   Objective: Vital signs in last 24 hours: Temp:  [97.4 F (36.3 C)-98.7 F (37.1 C)] 97.4 F (36.3 C) (01/15 0756) Pulse Rate:  [65-95] 95 (01/15 0756) Cardiac Rhythm: Heart block (01/14 1900) Resp:  [17-26] 23 (01/15 0756) BP: (114-130)/(72-81) 122/81 (01/15 0756) SpO2:  [92 %-98 %] 98 % (01/15 0756) Weight:  [237 lb 8 oz (107.7 kg)] 237 lb 8 oz (107.7 kg) (01/15 0340)  Hemodynamic parameters for last 24 hours: CVP:  [13 mmHg-15 mmHg] 15 mmHg  Intake/Output from previous day: 01/14 0701 - 01/15 0700 In: 730 [P.O.:720; I.V.:10] Out: 850 [Urine:850] Intake/Output this shift: No intake/output data recorded.  General appearance: alert, cooperative and no distress Heart: regular rate and rhythm, S1, S2 normal, no murmur, click, rub or gallop Lungs: clear to auscultation bilaterally Abdomen: soft, non-tender; bowel sounds normal; no masses,  no organomegaly Extremities: 1+ nonpitting pedal edema Wound: clean and dry  Lab Results: Recent Labs    09/04/17 0414 09/05/17 0443  WBC 5.0 5.7  HGB 8.5* 8.4*  HCT 28.0* 27.1*  PLT 234 234   BMET:  Recent Labs    09/04/17 0414 09/05/17 0443  NA 133* 133*  K 4.4 4.4  CL 96* 94*  CO2 29 29  GLUCOSE 132* 107*  BUN 12 13  CREATININE 0.64 0.74  CALCIUM 8.4* 8.5*    PT/INR:  Recent Labs    09/05/17 0443  LABPROT 19.9*  INR 1.71   ABG    Component Value Date/Time   PHART 7.417 08/30/2017 1008   HCO3 28.1 (H) 08/30/2017 1008   TCO2 29 08/31/2017 1706   O2SAT 51.5 09/04/2017 0435   CBG (last 3)  Recent Labs    09/04/17 1614 09/04/17 2113 09/05/17 0619  GLUCAP 116* 114* 100*    Assessment/Plan: S/P  Procedure(s) (LRB): MITRAL VALVE (MV) REPLACEMENT (N/A) TRANSESOPHAGEAL ECHOCARDIOGRAM (TEE) (N/A)  1. CV-NSR in the 70s, he did have some pausing last night on telemetry and one strip reading 1st degree block.  BP well controlled. Last Coox 51.5%, Milrinone stopped. Heart failure following and assisting. Should be okay to discontinue epicardial pacing wires. 2. INR 1.71 today, on Warfarin 7.5mg  4. Renal- creatinine WNL, weight is stable on Lasix, and Spironolactone, K is WNL 5. Dysphagia- SLP following, H/H ordered 6. DM- sugars controlled 7. Neck and shoulder pains-will order k-pad.  8. Dispo- patient stable, NSR with 1st degree AV Block, continue coumadin, continue diuretics. Possible removal of EPW today, will discuss with Dr. Donata ClayVan Trigt.      LOS: 16 days    Sharlene Doryessa N Shalae Belmonte 09/05/2017

## 2017-09-05 NOTE — Progress Notes (Signed)
Family member requesting sheers to open lidocaine patch. Student advised family member to consult RN prior to administration. Family member continues to  place patch on R upper chest and R upper back of pt. RN notified. RN at bedside.

## 2017-09-05 NOTE — Progress Notes (Signed)
CARDIAC REHAB PHASE I   PRE:  Rate/Rhythm: 78 SR  BP:  Supine:   Sitting: 130/80  Standing:    SaO2: 91%RA  MODE:  Ambulation: 610 ft   POST:  Rate/Rhythm: 89 SR  BP:  Supine:   Sitting: 138/76  Standing:    SaO2: 92%RA 1044-1122 Pt walked 610 ft on RA with rolling walker and minimal asst to manage IV pole. Pt stopped a couple of times to rest. To recliner after walk with call bell. Emotional support given as pt tearful during walk. Would recommend rolling walker or rollator for home use.    Luetta Nuttingharlene Leoda Smithhart, RN BSN  09/05/2017 11:20 AM

## 2017-09-05 NOTE — Progress Notes (Signed)
Patient ID: Reginald [name], male   DOB: 26-Jun-1961, 57 y.o.   MRN: [id number]     Advanced Heart Failure Rounding Note  HF Cardiology: Shirlee Latch  Subjective:    MV replacement with bioprosthetic valve 08/29/17.   Milrinone stopped 08/31/17.  Feeling OK this am. Denies SOB. Worked with PT yesterday. O2 dropped to 87% with ambulation. Denies lightheadedness. Anxious to go home.   MRI spine 08/24/17 with continued evidence of Osteomyelitis and joint infection in C1-C2, but positive response to treatment. Less regional edema and diminishing paraspinal fluid collections.  Less edema at C4-C5 level.  Objective:   Weight Range: 237 lb 8 oz (107.7 kg) Body mass index is 35.07 kg/m.   Vital Signs:   Temp:  [97.4 F (36.3 C)-98.7 F (37.1 C)] 97.4 F (36.3 C) (01/15 0756) Pulse Rate:  [65-95] 95 (01/15 0756) Resp:  [17-26] 23 (01/15 0756) BP: (114-130)/(72-81) 122/81 (01/15 0756) SpO2:  [92 %-98 %] 98 % (01/15 0916) Weight:  [237 lb 8 oz (107.7 kg)] 237 lb 8 oz (107.7 kg) (01/15 0340) Last BM Date: 09/03/17  Weight change: Filed Weights   09/03/17 0453 09/04/17 0426 09/05/17 0340  Weight: 238 lb 4.8 oz (108.1 kg) 238 lb 1.6 oz (108 kg) 237 lb 8 oz (107.7 kg)    Intake/Output:   Intake/Output Summary (Last 24 hours) at 09/05/2017 1033 Last data filed at 09/05/2017 0449 Gross per 24 hour  Intake 490 ml  Output 550 ml  Net -60 ml    Physical Exam   CVP 7-8 cm  General: NAD  HEENT: Normal Neck: Supple. JVP difficult due to body habitus. Carotids 2+ bilat; no bruits. No thyromegaly or nodule noted. Cor: PMI nondisplaced. RRR, No M/G/R noted Lungs: CTAB, normal effort. Abdomen: Soft, non-tender, non-distended, no HSM. No bruits or masses. +BS  Extremities: No cyanosis, clubbing, or rash. Ted hose in place. Edema improved.  Neuro: Alert & orientedx3, cranial nerves grossly intact. moves all 4 extremities w/o difficulty. Affect pleasant   Telemetry   NSR 70s, personally  reviewed.   Labs    CBC Recent Labs    09/04/17 0414 09/05/17 0443  WBC 5.0 5.7  HGB 8.5* 8.4*  HCT 28.0* 27.1*  MCV 91.8 89.7  PLT 234 234   Basic Metabolic Panel Recent Labs    78/29/56 0414 09/05/17 0443  NA 133* 133*  K 4.4 4.4  CL 96* 94*  CO2 29 29  GLUCOSE 132* 107*  BUN 12 13  CREATININE 0.64 0.74  CALCIUM 8.4* 8.5*   Liver Function Tests No results for input(s): AST, ALT, ALKPHOS, BILITOT, PROT, ALBUMIN in the last 72 hours. No results for input(s): LIPASE, AMYLASE in the last 72 hours. Cardiac Enzymes Recent Labs    09/05/17 0443  CKTOTAL 17*    BNP: BNP (last 3 results) Recent Labs    07/26/17 0340 08/19/17 2300  BNP 104.6* 204.8*    ProBNP (last 3 results) No results for input(s): PROBNP in the last 8760 hours.   D-Dimer No results for input(s): DDIMER in the last 72 hours. Hemoglobin A1C No results for input(s): HGBA1C in the last 72 hours. Fasting Lipid Panel No results for input(s): CHOL, HDL, LDLCALC, TRIG, CHOLHDL, LDLDIRECT in the last 72 hours. Thyroid Function Tests No results for input(s): TSH, T4TOTAL, T3FREE, THYROIDAB in the last 72 hours.  Invalid input(s): FREET3  Other results:   Imaging    No results found.   Medications:     Scheduled Medications: .  aspirin EC  81 mg Oral Daily  . chlorhexidine  15 mL Mouth Rinse BID  . Chlorhexidine Gluconate Cloth  6 each Topical Daily  . enoxaparin (LOVENOX) injection  40 mg Subcutaneous Q24H  . furosemide  60 mg Oral BID  . insulin aspart  0-24 Units Subcutaneous TID AC & HS  . insulin detemir  15 Units Subcutaneous BID  . lisinopril  5 mg Oral Daily  . mometasone-formoterol  2 puff Inhalation BID  . pantoprazole  40 mg Oral Daily  . sodium chloride flush  10-40 mL Intracatheter Q12H  . sodium chloride flush  3 mL Intravenous Q12H  . spironolactone  12.5 mg Oral Daily  . thiamine  100 mg Oral Daily  . Warfarin - Physician Dosing Inpatient   Does not apply q1800      Infusions: . sodium chloride Stopped (08/30/17 0700)  . sodium chloride    . DAPTOmycin (CUBICIN)  IV 1,000 mg (09/04/17 1756)    PRN Medications: sodium chloride, guaiFENesin, levalbuterol, magnesium hydroxide, ondansetron (ZOFRAN) IV, oxyCODONE, sodium chloride flush, sodium chloride flush, sorbitol, traMADol   Patient Profile   Reginald [name] is a 57 y.o. 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   Assessment/Plan   1. Acute on chronic diastolic CHF: Now s/p MV replacement.   - CVP 7-8 cm.  - Continue lasix 60 mg BID. Continue ted hose.  - Increase spiro to 25 mg daily and follow K closely.  - Reinforced fluid restriction to < 2 L daily, sodium restriction to less than 2000 mg daily, and the importance of daily weights.   2. 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  Last blood cultures 12/25 NGTD. He is on daptomycin.  Now s/p MV replacement with bioprosthetic valve.   - Stable. 3. Anemia: Suspect anemia of chronic disease, got 1 unit PRBCs 12/31 1/9 - Hgb 8.4.  - FOBT was recently negative.  4. DVT: Left PT vein (below knee). Will need 3 months anticoagulation for this.    - INR 1.71. Discussed with Pharm-D. TCTS dosing.  - Pharmacy to follow up with Dr Maren BeachVantrigt about DVT treatment. No change. 5. Rhythm:  - Junctional rhythm post-op, no atrial fibrillation.  - NSR this am. RAte in 70-80s. Continue to follow.   Graciella FreerMichael Andrew Tillery, PA-C  09/05/2017    Advanced Heart Failure Team Pager [phone] (M-F; 7a - 4p)  Please contact [facility] for night-coverage after hours (4p -7a ) and weekends on [email or web address]   Patient seen with PA, agree with the above note.  Volume status stable with CVP 7-8.  Continue current Lasix.  Will need warfarin for 3 months with below knee DVT.    NSR with 1st degree AV block, not pacing.   Reginald [name] 09/05/2017 1:21 PM

## 2017-09-05 NOTE — Progress Notes (Signed)
Physical Therapy Treatment Patient Details Name: Reginald [name] MRN: [id number] DOB: [birth year] Today's Date: 09/05/2017    History of Present Illness [name] a 57 y.o.malewith PMH significant ofspinal osteomyelitis, alcohol abuse, DMII, paraspinal abscess, tobacco abuse, nonobstructive CAD, mitral regurgitation whohad very complicated admissionfrom 07/25/2017 to 08/10/2017 due to mitral valve large vegetation/endocarditis, possible aortic valve endocarditis, septic emboli to the brain, multifocal pneumonia, C1 osteomyelitis, C4 discitis and paraspinal abscess due to MRSA bacteremia.Admitted back to hospital from [facility] 08/20/17 with resp distress and edema. Acute DVT 1/7. S/p MVR 08/29/17; intub 1/8 for sx, with successful extub 1/9.    PT Comments    Pt performed gait training with RW and cues during session to maintain sternal precautions.  Pt able to recall 2/3 precautions and required cues to recall do not lift.  Pt pleasantly agitated during session.  Plan for stair training next session as unclear if he truly has stairs to enter.  Pt remains to present with memory deficits and unable to recall his home set up.    Follow Up Recommendations  Home health PT;Supervision/Assistance - 24 hour     Equipment Recommendations  Rolling walker with 5" wheels    Recommendations for Other Services       Precautions / Restrictions Precautions Precautions: Fall;Sternal Precaution Comments: watch O2 sats Restrictions Other Position/Activity Restrictions: Sternal    Mobility  Bed Mobility               General bed mobility comments: in chair on arrival  Transfers Overall transfer level: Needs assistance   Transfers: Sit to/from Stand Sit to Stand: Supervision         General transfer comment: Supervision for safety with cues for hand placement on knees to maintain sternal precautions.    Ambulation/Gait Ambulation/Gait assistance: Supervision Ambulation Distance (Feet): 450  Feet Assistive device: Rolling walker (2 wheeled) Gait Pattern/deviations: Step-through pattern;Decreased stride length;Trunk flexed Gait velocity: Decreased Gait velocity interpretation: Below normal speed for age/gender General Gait Details: Pt with slow guarded gait. O2 dropped to 84% during ambulation. Cues for pursed lip breathing and able to improve greater than 90% with standing rest breaks and instructions for breathing.  .     Stairs Stairs: (pt reports he does not have stairs to enter his home and denied need for stair training on eval reports stairs to enter home.  )          Wheelchair Mobility    Modified Rankin (Stroke Patients Only)       Balance Overall balance assessment: Needs assistance   Sitting balance-Leahy Scale: Good       Standing balance-Leahy Scale: Fair Standing balance comment: can release walker in static standing                            Cognition Arousal/Alertness: Awake/alert Behavior During Therapy: WFL for tasks assessed/performed Overall Cognitive Status: Within Functional Limits for tasks assessed Area of Impairment: Memory;Problem solving                     Memory: Decreased short-term memory         General Comments: Pt required education to recall sternal precautions and cues during session to maintain.        Exercises      General Comments        Pertinent Vitals/Pain Pain Assessment: No/denies pain    Home Living  Prior Function            PT Goals (current goals can now be found in the care plan section) Acute Rehab PT Goals Patient Stated Goal: go home Potential to Achieve Goals: Good Progress towards PT goals: Progressing toward goals    Frequency           PT Plan Current plan remains appropriate    Co-evaluation              AM-PAC PT "6 Clicks" Daily Activity  Outcome Measure  Difficulty turning over in bed (including adjusting  bedclothes, sheets and blankets)?: None Difficulty moving from lying on back to sitting on the side of the bed? : None Difficulty sitting down on and standing up from a chair with arms (e.g., wheelchair, bedside commode, etc,.)?: A Little Help needed moving to and from a bed to chair (including a wheelchair)?: A Little Help needed walking in hospital room?: A Little Help needed climbing 3-5 steps with a railing? : A Little 6 Click Score: 20    End of Session Equipment Utilized During Treatment: Gait belt Activity Tolerance: Patient limited by fatigue;Patient tolerated treatment well(desaturated with activity.  ) Patient left: in chair;with call bell/phone within reach;with family/visitor present Nurse Communication: Mobility status PT Visit Diagnosis: Unsteadiness on feet (R26.81);Other abnormalities of gait and mobility (R26.89);Muscle weakness (generalized) (M62.81)     Time: [id number] PT Time Calculation (min) (ACUTE ONLY): 14 min  Charges:  $Gait Training: 8-22 mins                    G Codes:       Joycelyn RuaAimee Previn Jian, PTA pager [phone]    Florestine Aversimee J Satoru Milich 09/05/2017, 4:55 PM

## 2017-09-05 NOTE — Progress Notes (Signed)
Pharmacy Antibiotic Note  Reginald [name] is a 57 y.o. male admitted on 08/20/2017 with endocarditis s/p bioprosthetic MVR.  Pharmacy has been consulted for Daptomycin dosing. Last CK 17  WNL, afebrile, WBC wnl, Cr 0.7  Plan: Daptomycin 1gm (10mg /kg) q24hr - last day planned 09/10/2017  Height: 5\' 9"  (175.3 cm) Weight: 237 lb 8 oz (107.7 kg) IBW/kg (Calculated) : 70.7  Temp (24hrs), Avg:97.9 F (36.6 C), Min:97.4 F (36.3 C), Max:98.7 F (37.1 C)  Recent Labs  Lab 09/01/17 0415 09/02/17 0338 09/03/17 0443 09/04/17 0414 09/05/17 0443  WBC 7.3 5.3 4.5 5.0 5.7  CREATININE 0.86 0.77 0.62 0.64 0.74    Estimated Creatinine Clearance: 123.2 mL/min (by C-G formula based on SCr of 0.74 mg/dL).    Allergies  Allergen Reactions  . Morphine And Related Other (See Comments)    "freaks out"    Leota SauersLisa Keymoni Mccaster Pharm.D. CPP, BCPS Clinical Pharmacist [phone] 09/05/2017 8:59 AM

## 2017-09-06 LAB — PROTIME-INR
INR: 1.68
Prothrombin Time: 19.6 seconds — ABNORMAL HIGH (ref 11.4–15.2)

## 2017-09-06 LAB — GLUCOSE, CAPILLARY
Glucose-Capillary: 98 mg/dL (ref 65–99)
Glucose-Capillary: 151 mg/dL — ABNORMAL HIGH (ref 65–99)

## 2017-09-06 LAB — BASIC METABOLIC PANEL
Anion gap: 11 (ref 5–15)
BUN: 12 mg/dL (ref 6–20)
CHLORIDE: 92 mmol/L — AB (ref 101–111)
CO2: 28 mmol/L (ref 22–32)
CREATININE: 0.75 mg/dL (ref 0.61–1.24)
Calcium: 8.4 mg/dL — ABNORMAL LOW (ref 8.9–10.3)
GFR calc non Af Amer: >60 mL/min (ref >60)
Glucose, Bld: 130 mg/dL — ABNORMAL HIGH (ref 65–99)
Potassium: 3.9 mmol/L (ref 3.5–5.1)
Sodium: 131 mmol/L — ABNORMAL LOW (ref 135–145)

## 2017-09-06 MED ORDER — SPIRONOLACTONE 25 MG PO TABS: 25.0000 mg | ORAL_TABLET | Freq: Every day | ORAL | 1 refills | Status: DC

## 2017-09-06 MED ORDER — FUROSEMIDE 80 MG PO TABS: 80.0000 mg | ORAL_TABLET | Freq: Every day | ORAL | 1 refills | Status: DC

## 2017-09-06 MED ORDER — HEPARIN SOD (PORK) LOCK FLUSH 100 UNIT/ML IV SOLN
250.0000 [IU] | INTRAVENOUS | Status: AC | PRN
  Administered 2017-09-06: 250 [IU]

## 2017-09-06 MED ORDER — WARFARIN SODIUM 5 MG PO TABS: 5.0000 mg | ORAL_TABLET | Freq: Every day | ORAL | 1 refills | Status: DC

## 2017-09-06 MED ORDER — OXYCODONE HCL 5 MG PO TABS: 5.0000 mg | ORAL_TABLET | Freq: Four times a day (QID) | ORAL | 0 refills | Status: DC | PRN

## 2017-09-06 MED ORDER — ASPIRIN 81 MG PO TBEC: 81.0000 mg | DELAYED_RELEASE_TABLET | Freq: Every day | ORAL | Status: DC

## 2017-09-06 MED ORDER — DAPTOMYCIN IV (FOR PTA / DISCHARGE USE ONLY): 1000.0000 mg | INTRAVENOUS | 0 refills | Status: AC

## 2017-09-06 MED ORDER — POTASSIUM CHLORIDE ER 20 MEQ PO TBCR: 20.0000 meq | EXTENDED_RELEASE_TABLET | Freq: Two times a day (BID) | ORAL | 1 refills | Status: DC

## 2017-09-06 MED ORDER — LISINOPRIL 5 MG PO TABS: 5.0000 mg | ORAL_TABLET | Freq: Every day | ORAL | 1 refills | Status: DC

## 2017-09-06 NOTE — Care Management Note (Addendum)
Case Management Note Previous CM note completed by Leone Haven, RN--08/23/2017, 10:45 AM   Patient Details  Name: Reginald [name] MRN: [id number] Date of Birth: 18-Sep-1960  Subjective/Objective:  From home with his girlfriend, pta indep, presents with Acute on chronic diastolic CHF with severe MR, he has MV endocarditis. He has no insurance,  He is for surgery next Tuesday.  Per MD with HF, patient can go home and come back for surgery.  Patient states his sister will be with him 24/7 til he comes back for surgery.  He will most likely need HHRN  (charity) and rolling walker.  N[facility] , he can also utilize the [facility] for medication assist. Per attending they are adjusting his medications today for renal function and he is to get MRI, so he may possibly dc tomorrow. NCM spoke with Lupita Leash with [facility], she state they do not service his area. NCM spoke with Madison Hickman she states we would probably need to do (LOG) for another [facility] agency if he Is going to need a HHRN.  NCM informed Pam not sure yet of the plan, will wait to see what MD says.                     Action/Plan: NCM will follow for dc needs.   Expected Discharge Date:  09/06/17               Expected Discharge Plan:  Home w Home Health Services  In-House Referral:  NA  Discharge planning Services  CM Consult  Post Acute Care Choice:  Durable Medical Equipment, Home Health Choice offered to:  Patient  DME Arranged:  3-N-1, Walker rolling with seat DME Agency:  [facility]  HH Arranged:  RN [facility] Agency:  Other - See comment  Status of Service:  Completed, signed off  If discussed at Long Length of Stay Meetings, dates discussed:  1/10, 1/15  Discharge Disposition: home/home health   Additional Comments:  09/06/17- 1100- Alben Jepsen RN, CM- pt for d/c home today with [facility] services for IV abx needs- [facility] has contacted [facility]) and they have agreed to work with pt under LOG for [facility] services along with [facility] for IV abx- CM has spoken with dept dir. Hope Rife- and have received approval for LOG [facility] services for [facility] visits with start of care date 09/07/17 through Feb. 19 end date for IV abx. - have spoken with Revonda Standard at [facility] regarding services and have faxed needed paper work to [facility] - Pam with [facility] has also spoken with Revonda Standard and faxed paperwork for IV abx needs - to assist in coordination of [facility] care- spoke with pt at bedside to inform him of arrangement- he states that he will have friend who is a nurse there tomorrow for teaching with [facility] agency for iv abx needs- notified Lupita Leash with [facility] for DME needs - 3n1 and rollator- which will be delivered to room prior to discharge later today after pt receives his dose of abx for today.   Contact info for [phone] (fax-[phone])  09/05/17- 1100- Reginald Pierini RN, CM- spoke with pt at bedside for transition of care needs- per pt he will need a walker- would prefer a rollator and a 3n1- have spoken with pharmacy to clarify end date for iv abx- as ID note states  pt will need abs 6 wk post MVR- pt will need HHRN order for discharge- pt states that he does not feel like he will need HHPT as he feels like he can continue to mobilize on his own. [facility]- as they can not service pt's area- have reached out to several other agencies who also can not service pt- [facility] will continue to work on finding an agency to work with that will take an [facility]- CM will f/u in am   08/28/17- 1250- Reginald Paulsen RN, CM- pt with acute DVT- started on IV heparin last week with plan for MVR surgery on 08/29/17- CM will follow post op for transition of care needs- [facility] follow.   Zenda AlpersWebster, SedonaKristi Hall, RN 09/06/2017, 1:54 PM [phone]

## 2017-09-06 NOTE — Progress Notes (Signed)
[id number] Education completed with pt and significant other who voiced understanding. Discussed sternal precautions IS, walking for ex, and carb counting. Gave diabetic diet. Reinforced smoking cessation. Pt stated will not be a problem to remain not smoking. Referring to [place] CRP 2. Luetta NuttingCharlene Remon Quinto RN BSN 09/06/2017 11:18 AM

## 2017-09-06 NOTE — Progress Notes (Signed)
[address]       [place] [address]             [phone]      8 Days Post-Op Procedure(s) (LRB): MITRAL VALVE (MV) REPLACEMENT (N/A) TRANSESOPHAGEAL ECHOCARDIOGRAM (TEE) (N/A) Subjective: Feels ok, no specific issues  Objective: Vital signs in last 24 hours: Temp:  [97.7 F (36.5 C)-98.7 F (37.1 C)] 97.7 F (36.5 C) (01/16 0804) Pulse Rate:  [71-85] 71 (01/16 0804) Cardiac Rhythm: Heart block (01/16 0731) Resp:  [15-26] 18 (01/16 0804) BP: (94-148)/(55-82) 127/72 (01/16 0804) SpO2:  [78 %-100 %] 93 % (01/16 0804) Weight:  [235 lb 8 oz (106.8 kg)] 235 lb 8 oz (106.8 kg) (01/16 0520)  Hemodynamic parameters for last 24 hours: CVP:  [8 mmHg-13 mmHg] 10 mmHg  Intake/Output from previous day: 01/15 0701 - 01/16 0700 In: 240 [P.O.:240] Out: 2000 [Urine:2000] Intake/Output this shift: Total I/O In: 240 [P.O.:240] Out: 100 [Urine:100]  General appearance: alert, cooperative and no distress Heart: regular rate and rhythm Lungs: slightly dim in bases Abdomen: benign Extremities: no edema Wound: incis healing well  Lab Results: Recent Labs    09/04/17 0414 09/05/17 0443  WBC 5.0 5.7  HGB 8.5* 8.4*  HCT 28.0* 27.1*  PLT 234 234   BMET:  Recent Labs    09/05/17 0443 09/06/17 0440  NA 133* 131*  K 4.4 3.9  CL 94* 92*  CO2 29 28  GLUCOSE 107* 130*  BUN 13 12  CREATININE 0.74 0.75  CALCIUM 8.5* 8.4*    PT/INR:  Recent Labs    09/06/17 0440  LABPROT 19.6*  INR 1.68   ABG    Component Value Date/Time   PHART 7.417 08/30/2017 1008   HCO3 28.1 (H) 08/30/2017 1008   TCO2 29 08/31/2017 1706   O2SAT 51.5 09/04/2017 0435   CBG (last 3)  Recent Labs    09/05/17 1643 09/05/17 2113 09/06/17 0606  GLUCAP 125* 174* 98    Meds Scheduled Meds: . aspirin EC  81 mg Oral Daily  . chlorhexidine  15 mL Mouth Rinse BID  . Chlorhexidine Gluconate Cloth  6 each Topical Daily  . enoxaparin (LOVENOX) injection  40 mg Subcutaneous  Q24H  . furosemide  60 mg Oral BID  . insulin aspart  0-24 Units Subcutaneous TID AC & HS  . insulin detemir  15 Units Subcutaneous BID  . lisinopril  5 mg Oral Daily  . mometasone-formoterol  2 puff Inhalation BID  . pantoprazole  40 mg Oral Daily  . sodium chloride flush  10-40 mL Intracatheter Q12H  . sodium chloride flush  3 mL Intravenous Q12H  . spironolactone  25 mg Oral Daily  . thiamine  100 mg Oral Daily  . Warfarin - Physician Dosing Inpatient   Does not apply q1800   Continuous Infusions: . sodium chloride Stopped (08/30/17 0700)  . sodium chloride    . DAPTOmycin (CUBICIN)  IV Stopped (09/05/17 1841)   PRN Meds:.sodium chloride, guaiFENesin, levalbuterol, magnesium hydroxide, ondansetron (ZOFRAN) IV, oxyCODONE, sodium chloride, sodium chloride flush, sodium chloride flush, sorbitol, traMADol  Xrays No results found.  Assessment/Plan: S/P Procedure(s) (LRB): MITRAL VALVE (MV) REPLACEMENT (N/A) TRANSESOPHAGEAL ECHOCARDIOGRAM (TEE) (N/A)  1 doing well  2 appears stable for d/c, sinus with first deg block, hemodyn stable 3 abx arranged 4 INR 1.68, will d/c on 5 mg 5 AHF thinks 80 of lasix daily is ok  LOS: 17 days    Reginald [name]  Reginald [name] 09/06/2017

## 2017-09-06 NOTE — Progress Notes (Addendum)
Patient ID: Reginald [name], male   DOB: 1961/06/23, 57 y.o.   MRN: [id number]     Advanced Heart Failure Rounding Note  HF Cardiology: Shirlee Latch  Subjective:    MV replacement with bioprosthetic valve 08/29/17.   Milrinone stopped 08/31/17.  Yesterday spiro was increased to 25 mg daily.   Wants to go home today. Denies SOB MRI spine 08/24/17 with continued evidence of Osteomyelitis and joint infection in C1-C2, but positive response to treatment. Less regional edema and diminishing paraspinal fluid collections.  Less edema at C4-C5 level.  Objective:   Weight Range: 235 lb 8 oz (106.8 kg) Body mass index is 34.78 kg/m.   Vital Signs:   Temp:  [97.7 F (36.5 C)-98.7 F (37.1 C)] 97.7 F (36.5 C) (01/16 0804) Pulse Rate:  [71-85] 71 (01/16 0804) Resp:  [15-26] 18 (01/16 0804) BP: (94-148)/(55-82) 127/72 (01/16 0804) SpO2:  [78 %-100 %] 93 % (01/16 0804) Weight:  [235 lb 8 oz (106.8 kg)] 235 lb 8 oz (106.8 kg) (01/16 0520) Last BM Date: 09/05/17  Weight change: Filed Weights   09/04/17 0426 09/05/17 0340 09/06/17 0520  Weight: 238 lb 1.6 oz (108 kg) 237 lb 8 oz (107.7 kg) 235 lb 8 oz (106.8 kg)    Intake/Output:   Intake/Output Summary (Last 24 hours) at 09/06/2017 0831 Last data filed at 09/06/2017 0804 Gross per 24 hour  Intake 480 ml  Output 1975 ml  Net -1495 ml    Physical Exam   General:  Sitting on side of the bed.  No resp difficulty HEENT: normal Neck: supple. JVP 5-6 . Carotids 2+ bilat; no bruits. No lymphadenopathy or thryomegaly appreciated. Cor: PMI nondisplaced. Regular rate & rhythm. No rubs, gallops or murmurs. Lungs: clear Abdomen: soft, nontender, nondistended. No hepatosplenomegaly. No bruits or masses. Good bowel sounds. Extremities: no cyanosis, clubbing, rash, R and LLE trace edema.  Neuro: alert & orientedx3, cranial nerves grossly intact. moves all 4 extremities w/o difficulty. Affect pleasant    Telemetry   NSR 70s  Labs     CBC Recent Labs    09/04/17 0414 09/05/17 0443  WBC 5.0 5.7  HGB 8.5* 8.4*  HCT 28.0* 27.1*  MCV 91.8 89.7  PLT 234 234   Basic Metabolic Panel Recent Labs    40/98/11 0443 09/06/17 0440  NA 133* 131*  K 4.4 3.9  CL 94* 92*  CO2 29 28  GLUCOSE 107* 130*  BUN 13 12  CREATININE 0.74 0.75  CALCIUM 8.5* 8.4*   Liver Function Tests No results for input(s): AST, ALT, ALKPHOS, BILITOT, PROT, ALBUMIN in the last 72 hours. No results for input(s): LIPASE, AMYLASE in the last 72 hours. Cardiac Enzymes Recent Labs    09/05/17 0443  CKTOTAL 17*    BNP: BNP (last 3 results) Recent Labs    07/26/17 0340 08/19/17 2300  BNP 104.6* 204.8*    ProBNP (last 3 results) No results for input(s): PROBNP in the last 8760 hours.   D-Dimer No results for input(s): DDIMER in the last 72 hours. Hemoglobin A1C No results for input(s): HGBA1C in the last 72 hours. Fasting Lipid Panel No results for input(s): CHOL, HDL, LDLCALC, TRIG, CHOLHDL, LDLDIRECT in the last 72 hours. Thyroid Function Tests No results for input(s): TSH, T4TOTAL, T3FREE, THYROIDAB in the last 72 hours.  Invalid input(s): FREET3  Other results:   Imaging    No results found.   Medications:     Scheduled Medications: . aspirin EC  81 mg  Oral Daily  . chlorhexidine  15 mL Mouth Rinse BID  . Chlorhexidine Gluconate Cloth  6 each Topical Daily  . enoxaparin (LOVENOX) injection  40 mg Subcutaneous Q24H  . furosemide  60 mg Oral BID  . insulin aspart  0-24 Units Subcutaneous TID AC & HS  . insulin detemir  15 Units Subcutaneous BID  . lisinopril  5 mg Oral Daily  . mometasone-formoterol  2 puff Inhalation BID  . pantoprazole  40 mg Oral Daily  . sodium chloride flush  10-40 mL Intracatheter Q12H  . sodium chloride flush  3 mL Intravenous Q12H  . spironolactone  25 mg Oral Daily  . thiamine  100 mg Oral Daily  . Warfarin - Physician Dosing Inpatient   Does not apply q1800    Infusions: .  sodium chloride Stopped (08/30/17 0700)  . sodium chloride    . DAPTOmycin (CUBICIN)  IV Stopped (09/05/17 1841)    PRN Medications: sodium chloride, guaiFENesin, levalbuterol, magnesium hydroxide, ondansetron (ZOFRAN) IV, oxyCODONE, sodium chloride, sodium chloride flush, sodium chloride flush, sorbitol, traMADol   Patient Profile   Reginald [name] is a 57 y.o. 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   Assessment/Plan   1. Acute on chronic diastolic CHF: Now s/p MV replacement.   -Volume status stable.  - Continue lasix 60 mg BID. Continue ted hose.  -Continue spiro to 25 mg daily. Renal function stable.   2. 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  Last blood cultures 12/25 NGTD. He is on daptomycin.  Now s/p MV replacement with bioprosthetic valve.   - Stable. 3. Anemia: Suspect anemia of chronic disease, got 1 unit PRBCs 12/31 1/9 - FOBT was recently negative.  4. DVT: Left PT vein (below knee). Will need 3 months anticoagulation for this.    - INR 1.68. Discussed with Pharm-D.  - Pharmacy to follow up with Dr Maren BeachVantrigt about DVT treatment. No change. 5. Rhythm:  - Junctional rhythm post-op, no atrial fibrillation.  - NSR 60s    Ok to go home. HF meds Spiro 25 mg daily Lasix 80 mg po daily--> lower dose at d/c  Lisinopril 5 mg daily Atorvastatin 10 mg daily Warfarin x 3 months then aspirin  HF follow up 09/15/17 has been set up.   Tonye BecketAmy Clegg, NP   09/06/2017   Advanced Heart Failure Team Pager [phone] (M-F; 7a - 4p)  Please contact [facility] for night-coverage after hours (4p -7a ) and weekends on [email or web address]   Patient seen with NP, agree with the above note.  He looks good today, no volume overload on exam.    Will send home on the above meds.  Add statin for nonobstructive CAD on pre-op cath.   He will need warfarin for 3 months for below knee DVT, needs coumadin clinic followup.   Reginald [name] 09/06/2017 12:03 PM

## 2017-09-06 NOTE — Progress Notes (Signed)
09/06/2017 1540 Discharge AVS meds taken today and those due this evening reviewed.  Follow-up appointments and when to call md reviewed.  D/C IV and TELE.  Questions and concerns addressed.   D/C home per orders. Kathryne HitchAllen, Victormanuel Mclure C

## 2017-09-06 NOTE — Progress Notes (Signed)
09/06/2017 1:00 PM CT sutures removed without difficulty.  Pt tolerated well. Kathryne HitchAllen, Hargun Spurling C

## 2017-09-07 ENCOUNTER — Ambulatory Visit (INDEPENDENT_AMBULATORY_CARE_PROVIDER_SITE_OTHER): Payer: Self-pay

## 2017-09-07 ENCOUNTER — Encounter (HOSPITAL_COMMUNITY): Payer: Self-pay

## 2017-09-07 DIAGNOSIS — Z952 Presence of prosthetic heart valve: Secondary | ICD-10-CM

## 2017-09-07 DIAGNOSIS — I330 Acute and subacute infective endocarditis: Secondary | ICD-10-CM

## 2017-09-07 DIAGNOSIS — Z7901 Long term (current) use of anticoagulants: Secondary | ICD-10-CM | POA: Insufficient documentation

## 2017-09-07 LAB — POCT INR: INR: 1.3

## 2017-09-07 NOTE — Patient Instructions (Signed)
Description   Spoke with pt's wife, advised to have pt take 7.5mg  (1.5 tablets) daily until recheck on Monday.  [facility], call Chales AbrahamsMary Ann [phone] will future redraw orders.

## 2017-09-11 ENCOUNTER — Encounter: Payer: Self-pay | Admitting: Family Medicine

## 2017-09-11 ENCOUNTER — Ambulatory Visit (INDEPENDENT_AMBULATORY_CARE_PROVIDER_SITE_OTHER): Payer: Self-pay | Admitting: Family Medicine

## 2017-09-11 ENCOUNTER — Ambulatory Visit (INDEPENDENT_AMBULATORY_CARE_PROVIDER_SITE_OTHER): Payer: Self-pay | Admitting: *Deleted

## 2017-09-11 VITALS — BP 100/62 | HR 78 | Temp 98.7°F | Resp 14 | Ht 69.0 in | Wt 228.0 lb

## 2017-09-11 DIAGNOSIS — I714 Abdominal aortic aneurysm, without rupture, unspecified: Secondary | ICD-10-CM

## 2017-09-11 DIAGNOSIS — F1011 Alcohol abuse, in remission: Secondary | ICD-10-CM

## 2017-09-11 DIAGNOSIS — Z952 Presence of prosthetic heart valve: Secondary | ICD-10-CM

## 2017-09-11 DIAGNOSIS — I10 Essential (primary) hypertension: Secondary | ICD-10-CM

## 2017-09-11 DIAGNOSIS — Z7901 Long term (current) use of anticoagulants: Secondary | ICD-10-CM

## 2017-09-11 DIAGNOSIS — E118 Type 2 diabetes mellitus with unspecified complications: Secondary | ICD-10-CM

## 2017-09-11 DIAGNOSIS — I330 Acute and subacute infective endocarditis: Secondary | ICD-10-CM

## 2017-09-11 DIAGNOSIS — Z789 Other specified health status: Secondary | ICD-10-CM

## 2017-09-11 DIAGNOSIS — Z87898 Personal history of other specified conditions: Secondary | ICD-10-CM

## 2017-09-11 LAB — POCT URINALYSIS DIP (DEVICE)
BILIRUBIN URINE: NEGATIVE
Glucose, UA: NEGATIVE mg/dL
HGB URINE DIPSTICK: NEGATIVE
KETONES UR: NEGATIVE mg/dL
Leukocytes, UA: NEGATIVE
Nitrite: NEGATIVE
PH: 7 (ref 5.0–8.0)
PROTEIN: 30 mg/dL — AB
SPECIFIC GRAVITY, URINE: 1.015 (ref 1.005–1.030)
Urobilinogen, UA: 2 mg/dL — ABNORMAL HIGH (ref 0.0–1.0)

## 2017-09-11 LAB — POCT INR: INR: 1.6

## 2017-09-11 MED ORDER — MUPIROCIN 2 % EX OINT: 1.0000 "application " | TOPICAL_OINTMENT | Freq: Two times a day (BID) | CUTANEOUS | 0 refills | Status: DC

## 2017-09-11 MED ORDER — ACETAMINOPHEN ER 650 MG PO TBCR: 650.0000 mg | EXTENDED_RELEASE_TABLET | Freq: Three times a day (TID) | ORAL | 2 refills | Status: AC | PRN

## 2017-09-11 NOTE — Progress Notes (Signed)
Patient ID: Reginald [name], male    DOB: 1961/03/16, 57 y.o.   MRN: [id number]  PCP: Scot Jun, FNP  Chief Complaint  Patient presents with  . Establish Care  . Hospitalization Follow-up    Subjective:  HPI Reginald [name] is a 57 y.o. male, with medical problems significant for ETOH, Type 2 Diabetes, Hypertension, A/P MVR secondary to endocarditis  (thought to be related very poor dentition, acute heart failure, acute heart failure, 4.5 cm AAA, cervical spine osteomyelitis, and elevated liver enzymes, presents to establish care and hospital follow-up. Prior to recent hospitalization he had not received any routine medical care due to lack of insurance. He is accompanied today by his wife.   In brief recap of hospitalization  Patient was admitted to [facility] in late November and was found to be hypoxic with MRSA bacteremia, and endocarditis. He experienced CHF exacerbation, ETOH withdrawal, and confusion. intubation. He was subsequently transferred to [facility] for evaluation  and treatment. During stay at [facility] , MRI of the brain was significant for bilateral septic emboli. TEE was significant large mass of anterior mitral leaflet with regurgitation thought to be related to possible perforation, w.normal EF, cardiology consulted. Patient underwent a right/left diagnostic heart cath without interventions. Cath was significant for elevated right heart pressure significant for pulmonary hypertension. The remainder of his hospitalization including: worsening ETOH withdrawal requiring CIWA protocol, intubation secondary to respiratory compromise, multiple tooth extractions due to poor dentition/dental caries, subsequently dx with cervical spine osteomyelitis /degenerative disc disease, aggressive antibiotic treatment with daptomycin and cerf, [facility], and subsequently underwent a mitral valve repair 08/20/2017 and discharged 09/06/2017. He is currently receiving  home IV daptomycin via PICC line. He is followed by [place]. Currently on chronic coumadin due to mitral valve repair which INR are managed by cardiology and drawn by [facility]. Last INR 09/07/2017 1.3 recommended INR 2.5-3.5(Refer to [facility] and D/C summary for additional information)  Post Hospitalization  Today, he reports no chest pain or although continues to experience some SOB. 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 He is currently managed by Cardiology through the Heart Failure clinic and cardiothoracic surgeon Dr. Prescott Gum management of recent valve repair. Continues to take coumadin and is currently taking an increased dose 7.5 mg due to non-therapeutic INR. Reports no recent episodes of bleeding. Tolerating and complying with  daily antibiotic therapy which is administered by spouse and family friend that is a Marine scientist. He was prescribed PRN oxycodone for severe pain and reports he has taken all of his pain medication. He is no longer drinking alcohol or smoking. He has to follow-up with the [facility] dental clinic for evaluation of recent oral surgery and neurosurgery regarding possible cervical spine surgery due to severe degenerative disc disease. Reginald [name] suffers from diabetes . A1C 3 weeks prior 5.8, well controlled. Current taking glipizide and metformin in addition to a long-acting insulin. He doesn't check blood sugar routinely at home. Home Health is checking blood pressure which has remained stable.  Social History   Socioeconomic History  . Marital status: Married    Spouse name: Not on file  . Number of children: Not on file  . Years of education: Not on file  . Highest education level: Not on file  Social Needs  . Financial resource strain: Not on file  . Food insecurity - worry: Not on file  .  Food insecurity - inability: Not on file  . Transportation needs - medical: Not on file  . Transportation  needs - non-medical: Not on file  Occupational History  . Not on file  Tobacco Use  . Smoking status: Former Smoker    Types: Cigarettes  . Smokeless tobacco: Never Used  Substance and Sexual Activity  . Alcohol use: No    Frequency: Never  . Drug use: No  . Sexual activity: Not Currently    Partners: Female  Other Topics Concern  . Not on file  Social History Narrative  . Not on file    Family History  Problem Relation Age of Onset  . Hypertension Mother   . Hypertension Father    Review of Systems  Constitutional: Positive for fatigue. Negative for chills, diaphoresis and fever.  Respiratory: Positive for shortness of breath. Negative for cough.   Cardiovascular: Negative for chest pain, palpitations and leg swelling.  Gastrointestinal: Negative.        AAA 4.5 cm  Skin: Positive for wound.  Neurological: Negative.  Negative for tremors and seizures.  Psychiatric/Behavioral: Negative.     Patient Active Problem List   Diagnosis Date Noted  . Long term (current) use of anticoagulants [Z79.01] 09/07/2017  . S/P MVR (mitral valve replacement) 08/29/2017  . SOB (shortness of breath)   . Lethargy   . Benign essential HTN   . Vascular headache   . Acute blood loss anemia   . Hyponatremia   . Essential hypertension   . Type 2 diabetes mellitus with peripheral neuropathy (HCC)   . Chronic pain syndrome   . Septic embolism ([place]) 08/10/2017  . Acute osteomyelitis of cervical spine ([place]) 08/10/2017  . Paraspinal abscess ([place]) 08/10/2017  . Debility 08/10/2017  . Hypertensive heart disease with heart failure ([place])   . Endotracheal tube present   . Acute respiratory disease   . 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 Acute respiratory failure ([place]) 07/26/2017  . Alcohol abuse 07/26/2017  . Thrombocytopenia ([place]) 07/26/2017  . Abnormal liver enzymes 07/26/2017  . Acute encephalopathy 07/26/2017  . Acute diastolic CHF (congestive heart failure) ([place]) 07/26/2017  . Sepsis  ([place])   . Endocarditis of mitral valve   . 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 MRSA bacteremia 07/25/2017    Allergies  Allergen Reactions  . Morphine And Related Other (See Comments)    "freaks out"    Prior to Admission medications   Medication Sig Start Date End Date Taking? Authorizing Provider  furosemide (LASIX) 80 MG tablet Take 1 tablet (80 mg total) by mouth daily. 09/06/17  Yes Gold, Wayne E, PA-C  glipiZIDE (GLUCOTROL) 10 MG tablet Take 10 mg by mouth daily before breakfast.   Yes [provider]  lisinopril (PRINIVIL,ZESTRIL) 5 MG tablet Take 1 tablet (5 mg total) by mouth daily. 09/06/17  Yes Gold, Wayne E, PA-C  metFORMIN (GLUMETZA) 500 MG (MOD) 24 hr tablet Take 500 mg by mouth 2 (two) times daily with a meal.   Yes [provider]  potassium chloride 20 MEQ TBCR Take 20 mEq by mouth 2 (two) times daily. 09/06/17  Yes Gold, Wilder Glade, PA-C  spironolactone (ALDACTONE) 25 MG tablet Take 1 tablet (25 mg total) by mouth daily. 09/06/17  Yes Gold, Patrick Jupiter E, PA-C  warfarin (COUMADIN) 5 MG tablet Take 1 tablet (5 mg total) by mouth daily. As directed by coumadin clinic 09/06/17  Yes Gold, Wilder Glade, PA-C  aspirin EC 81 MG EC tablet  Take 1 tablet (81 mg total) by mouth daily. Patient not taking: Reported on 09/11/2017 09/06/17   Jadene Pierini E, PA-C  daptomycin (CUBICIN) IVPB Inject 1,000 mg into the vein daily. Indication:  Endocarditis Last Day of Therapy:  10/10/17 Labs - Once weekly:  CBC/D, BMP, and CPK Labs - Every other week:  ESR and CRP Patient not taking: Reported on 09/11/2017 09/06/17 10/11/17  John Giovanni, PA-C  folic acid (FOLVITE) 1 MG tablet Take 1 tablet (1 mg total) by mouth daily. Patient not taking: Reported on 09/11/2017 08/11/17   Edwin Dada, MD  insulin aspart (NOVOLOG) 100 UNIT/ML injection Inject 0-15 Units into the skin 3 (three) times daily with meals. Patient not taking: Reported on 09/11/2017 08/10/17   Edwin Dada, MD  insulin  aspart (NOVOLOG) 100 UNIT/ML injection Inject 0-5 Units into the skin at bedtime. Patient not taking: Reported on 09/11/2017 08/10/17   Edwin Dada, MD  insulin glargine (LANTUS) 100 UNIT/ML injection Inject 0.12 mLs (12 Units total) into the skin daily. Patient not taking: Reported on 09/11/2017 08/11/17   Edwin Dada, MD  oxyCODONE (OXY IR/ROXICODONE) 5 MG immediate release tablet Take 1 tablet (5 mg total) by mouth every 6 (six) hours as needed for severe pain. Patient not taking: Reported on 09/11/2017 09/06/17   John Giovanni, PA-C  thiamine 100 MG tablet Take 1 tablet (100 mg total) by mouth daily. Patient not taking: Reported on 09/11/2017 08/11/17   Edwin Dada, MD    Past Medical, Surgical Family and Social History reviewed and updated.    Objective:   Today's Vitals   09/11/17 1311  BP: 100/62  Pulse: 78  Resp: 14  Temp: 98.7 F (37.1 C)  TempSrc: Oral  SpO2: 96%  Weight: 228 lb (103.4 kg)  Height: '5\' 9"'$  (1.753 m)    Wt Readings from Last 3 Encounters:  09/11/17 228 lb (103.4 kg)  09/06/17 235 lb 8 oz (106.8 kg)  08/19/17 246 lb 4.1 oz (111.7 kg)    Physical Exam  Constitutional: He is oriented to person, place, and time. He appears well-developed and well-nourished.  HENT:  Head: Normocephalic and atraumatic.  Right Ear: External ear normal.  Left Ear: External ear normal.  Nose: Nose normal.  Mouth/Throat: Oropharynx is clear and moist.  Eyes: Conjunctivae are normal. Pupils are equal, round, and reactive to light.  Cardiovascular: Normal rate, regular rhythm, normal heart sounds and intact distal pulses.  Pulmonary/Chest: Effort normal and breath sounds normal.  Lymphadenopathy:    He has no cervical adenopathy.  Neurological: He is alert and oriented to person, place, and time.  Skin: Skin is warm.  Sternal wound incision well approximated. Lower incision appears to have minimal purulent drainage crusting surround suture -mild  tenderness at the incision site with light palpation   Psychiatric: He has a normal mood and affect. His behavior is normal. Judgment and thought content normal.   Assessment & Plan:  1. S/P mitral valve replacement, managed by Dr. Prescott Gum with follow-up scheduled for 10/04/17. No abnormal heart rhythm noted during exam today. He is asymptomatic of chest pain today.  Concern regarding some purulent drainage from 1 of his suture sites.  Will prescribe some mupirocin ointment to apply twice daily.  Patient is on chronic IV antibiotic therapy at present therefore this reduces my concern for systemic infectious process.  Will check a CBC with differential. -mupirocin ointment (BACTROBAN) 2 %; Apply 1 application topically 2 (two)  times daily.    3. Abdominal aortic aneurysm (AAA) without rupture ([place]), stable 4.5 cm.  -Defer evaluation and monitoring to cardiology  4. Anticoagulated, Last INR 1.3, recommended therapeutic range is 2.5-3.5.  Coumadin is managed by cardiology and is checked through [facility].  At present patient reports that he is taking 7.5 mg of Coumadin.  And is asymptomatic of bleeding or bruising.  Cardiology will continue to manage.  5. Benign essential HTN, stable, and controlled. We have discussed target BP range and blood pressure goal. I have advised patient to check BP regularly and to call us back or report to clinic if the numbers are consistently higher than 140/90. We discussed the importance of compliance with medical therapy and DASH diet recommended, consequences of uncontrolled hypertension discussed. Continue current BP medications  6. Type 2 diabetes mellitus with complication, unspecified whether long term insulin use ([place]), A1c -5.8 (08/20/17).  Discontinuing long-acting insulin today.  We will continue metformin and glipizide as prescribed.  Patient denies any episodes of hypoglycemia.  He does not routinely monitor his blood sugar at home.  We will  continue to monitor glycemic control through routinely monitoring A1C.  Due to comorbidities patient has activity intolerance.  Therefore dietary management is highly recommended.   7. H/O ETOH abuse, patient reports abstinence since November and has maintained since recent hospital discharged.  Denies urge to drink at present. Encouraged continued cessation and support through programs such as AA. Liver enzymes elevated while inpatient. Checking  CMP today. - Comprehensive metabolic panel  8. Presence of surgical incision, well approximated, sutures intact, no evidence of infection. Small area of purulent discharge localized to one suture. Recommend mupirocin ointment application BID. Checking CBC    Patient became verbally frustrated when advised he would not receive a refill of oxycodone. He was offered Tylenol # 3 and or tramadol. He declined both. Recommended follow-up with cardiothoracic surgeon for additional oxycodone.  Meds ordered this encounter  Medications  . acetaminophen (TYLENOL 8 HOUR) 650 MG CR tablet    Sig: Take 1 tablet (650 mg total) by mouth every 8 (eight) hours as needed for pain.    Dispense:  90 tablet    Refill:  2    Order Specific Question:   Supervising Provider    Answer:   Tresa Garter [id number]  . mupirocin ointment (BACTROBAN) 2 %    Sig: Apply 1 application topically 2 (two) times daily.    Dispense:  60 g    Refill:  0    Order Specific Question:   Supervising Provider    Answer:   Tresa Garter [[id number]]     Orders Placed This Encounter  Procedures  . CBC with Differential  . Comprehensive metabolic panel  . POCT urinalysis dip (device)   A total of 60 minutes spent, greater than 50 % of this time was spent reviewing prior medical history, reviewing medications and indications of treatment, prior labs and diagnostic tests, discussing current plan of treatment, health promotion, and goals of treatment.   Carroll Sage. Kenton Kingfisher, MSN,  FNP-C [facility]  [address] [place], [place] [address] [phone]

## 2017-09-11 NOTE — Patient Instructions (Addendum)
I have discontinued your insulin. Continue glipizide and metformin.  For pain, you may take acetaminophen 500 mg ever 8 hours as needed for pain.  Keep all scheduled follow-ups.     DASH Eating Plan DASH stands for "Dietary Approaches to Stop Hypertension." The DASH eating plan is a healthy eating plan that has been shown to reduce high blood pressure (hypertension). It may also reduce your risk for type 2 diabetes, heart disease, and stroke. The DASH eating plan may also help with weight loss. What are tips for following this plan? General guidelines  Avoid eating more than 2,300 mg (milligrams) of salt (sodium) a day. If you have hypertension, you may need to reduce your sodium intake to 1,500 mg a day.  Limit alcohol intake to no more than 1 drink a day for nonpregnant women and 2 drinks a day for men. One drink equals 12 oz of beer, 5 oz of wine, or 1 oz of hard liquor.  Work with your health care provider to maintain a healthy body weight or to lose weight. Ask what an ideal weight is for you.  Get at least 30 minutes of exercise that causes your heart to beat faster (aerobic exercise) most days of the week. Activities may include walking, swimming, or biking.  Work with your health care provider or diet and nutrition specialist (dietitian) to adjust your eating plan to your individual calorie needs. Reading food labels  Check food labels for the amount of sodium per serving. Choose foods with less than 5 percent of the Daily Value of sodium. Generally, foods with less than 300 mg of sodium per serving fit into this eating plan.  To find whole grains, look for the word "whole" as the first word in the ingredient list. Shopping  Buy products labeled as "low-sodium" or "no salt added."  Buy fresh foods. Avoid canned foods and premade or frozen meals. Cooking  Avoid adding salt when cooking. Use salt-free seasonings or herbs instead of table salt or sea salt. Check with your  health care provider or pharmacist before using salt substitutes.  Do not fry foods. Cook foods using healthy methods such as baking, boiling, grilling, and broiling instead.  Cook with heart-healthy oils, such as olive, canola, soybean, or sunflower oil. Meal planning   Eat a balanced diet that includes: ? 5 or more servings of fruits and vegetables each day. At each meal, try to fill half of your plate with fruits and vegetables. ? Up to 6-8 servings of whole grains each day. ? Less than 6 oz of lean meat, poultry, or fish each day. A 3-oz serving of meat is about the same size as a deck of cards. One egg equals 1 oz. ? 2 servings of low-fat dairy each day. ? A serving of nuts, seeds, or beans 5 times each week. ? Heart-healthy fats. Healthy fats called Omega-3 fatty acids are found in foods such as flaxseeds and coldwater fish, like sardines, salmon, and mackerel.  Limit how much you eat of the following: ? Canned or prepackaged foods. ? Food that is high in trans fat, such as fried foods. ? Food that is high in saturated fat, such as fatty meat. ? Sweets, desserts, sugary drinks, and other foods with added sugar. ? Full-fat dairy products.  Do not salt foods before eating.  Try to eat at least 2 vegetarian meals each week.  Eat more home-cooked food and less restaurant, buffet, and fast food.  When eating at a  restaurant, ask that your food be prepared with less salt or no salt, if possible. What foods are recommended? The items listed may not be a complete list. Talk with your dietitian about what dietary choices are best for you. Grains Whole-grain or whole-wheat bread. Whole-grain or whole-wheat pasta. Brown rice. [place]row. Bulgur. Whole-grain and low-sodium cereals. Pita bread. Low-fat, low-sodium crackers. Whole-wheat flour tortillas. Vegetables Fresh or frozen vegetables (raw, steamed, roasted, or grilled). Low-sodium or reduced-sodium tomato and vegetable juice.  Low-sodium or reduced-sodium tomato sauce and tomato paste. Low-sodium or reduced-sodium canned vegetables. Fruits All fresh, dried, or frozen fruit. Canned fruit in natural juice (without added sugar). Meat and other protein foods Skinless chicken or [place]. Ground chicken or [place]. Pork with fat trimmed off. Fish and seafood. Egg whites. Dried beans, peas, or lentils. Unsalted nuts, nut butters, and seeds. Unsalted canned beans. Lean cuts of beef with fat trimmed off. Low-sodium, lean deli meat. Dairy Low-fat (1%) or fat-free (skim) milk. Fat-free, low-fat, or reduced-fat cheeses. Nonfat, low-sodium ricotta or cottage cheese. Low-fat or nonfat yogurt. Low-fat, low-sodium cheese. Fats and oils Soft margarine without trans fats. Vegetable oil. Low-fat, reduced-fat, or light mayonnaise and salad dressings (reduced-sodium). Canola, safflower, olive, soybean, and sunflower oils. Avocado. Seasoning and other foods Herbs. Spices. Seasoning mixes without salt. Unsalted popcorn and pretzels. Fat-free sweets. What foods are not recommended? The items listed may not be a complete list. Talk with your dietitian about what dietary choices are best for you. Grains Baked goods made with fat, such as croissants, muffins, or some breads. Dry pasta or rice meal packs. Vegetables Creamed or fried vegetables. Vegetables in a cheese sauce. Regular canned vegetables (not low-sodium or reduced-sodium). Regular canned tomato sauce and paste (not low-sodium or reduced-sodium). Regular tomato and vegetable juice (not low-sodium or reduced-sodium). Angie Fava. Olives. Fruits Canned fruit in a light or heavy syrup. Fried fruit. Fruit in cream or butter sauce. Meat and other protein foods Fatty cuts of meat. Ribs. Fried meat. Berniece Salines. Sausage. Bologna and other processed lunch meats. Salami. Fatback. Hotdogs. Bratwurst. Salted nuts and seeds. Canned beans with added salt. Canned or smoked fish. Whole eggs or egg yolks. Chicken  or [place] with skin. Dairy Whole or 2% milk, cream, and half-and-half. Whole or full-fat cream cheese. Whole-fat or sweetened yogurt. Full-fat cheese. Nondairy creamers. Whipped toppings. Processed cheese and cheese spreads. Fats and oils Butter. Stick margarine. Lard. Shortening. Ghee. Bacon fat. Tropical oils, such as coconut, palm kernel, or palm oil. Seasoning and other foods Salted popcorn and pretzels. Onion salt, garlic salt, seasoned salt, table salt, and sea salt. Worcestershire sauce. Tartar sauce. Barbecue sauce. Teriyaki sauce. Soy sauce, including reduced-sodium. Steak sauce. Canned and packaged gravies. Fish sauce. Oyster sauce. Cocktail sauce. Horseradish that you find on the shelf. Ketchup. Mustard. Meat flavorings and tenderizers. Bouillon cubes. Hot sauce and Tabasco sauce. Premade or packaged marinades. Premade or packaged taco seasonings. Relishes. Regular salad dressings. Where to find more information:  National Heart, Lung, and [place]: https://wilson-eaton.com/  American Heart Association: www.heart.org Summary  The DASH eating plan is a healthy eating plan that has been shown to reduce high blood pressure (hypertension). It may also reduce your risk for type 2 diabetes, heart disease, and stroke.  With the DASH eating plan, you should limit salt (sodium) intake to 2,300 mg a day. If you have hypertension, you may need to reduce your sodium intake to 1,500 mg a day.  When on the DASH eating plan, aim to eat more  fresh fruits and vegetables, whole grains, lean proteins, low-fat dairy, and heart-healthy fats.  Work with your health care provider or diet and nutrition specialist (dietitian) to adjust your eating plan to your individual calorie needs. This information is not intended to replace advice given to you by your health care provider. Make sure you discuss any questions you have with your health care provider. Document Released: 07/28/2011 Document Revised:  08/01/2016 Document Reviewed: 08/01/2016 Elsevier Interactive Patient Education  [employer].

## 2017-09-11 NOTE — Patient Instructions (Addendum)
Today take a total of 2 tablets of Coumadin then start taking 7.5mg  (1.5 tablets) except 10mg  (2 tablets) on Mondays.  Recheck INR on Friday by Home Health RN.   A full discussion of the nature of anticoagulants has been carried out.  A benefit risk analysis has been presented to the patient, so that they understand the justification for choosing anticoagulation at this time. The need for frequent and regular monitoring, precise dosage adjustment and compliance is stressed.  Side effects of potential bleeding are discussed.  The patient should avoid any OTC items containing aspirin or ibuprofen, and should avoid great swings in general diet.  Avoid alcohol consumption.  Call if any signs of abnormal bleeding.

## 2017-09-12 LAB — CBC WITH DIFFERENTIAL/PLATELET
BASOS ABS: 0.1 10*3/uL (ref 0.0–0.2)
BASOS: 1 %
EOS (ABSOLUTE): 0.2 10*3/uL (ref 0.0–0.4)
EOS: 3 %
HEMATOCRIT: 33.9 % — AB (ref 37.5–51.0)
HEMOGLOBIN: 10.5 g/dL — AB (ref 13.0–17.7)
IMMATURE GRANS (ABS): 0 10*3/uL (ref 0.0–0.1)
Immature Granulocytes: 1 %
LYMPHS ABS: 1.8 10*3/uL (ref 0.7–3.1)
LYMPHS: 26 %
MCH: 26.8 pg (ref 26.6–33.0)
MCHC: 31 g/dL — AB (ref 31.5–35.7)
MCV: 87 fL (ref 79–97)
MONOCYTES: 5 %
Monocytes Absolute: 0.3 10*3/uL (ref 0.1–0.9)
NEUTROS ABS: 4.7 10*3/uL (ref 1.4–7.0)
Neutrophils: 64 %
Platelets: 322 10*3/uL (ref 150–379)
RBC: 3.92 x10E6/uL — ABNORMAL LOW (ref 4.14–5.80)
RDW: 16.2 % — ABNORMAL HIGH (ref 12.3–15.4)
WBC: 7.2 10*3/uL (ref 3.4–10.8)

## 2017-09-12 LAB — COMPREHENSIVE METABOLIC PANEL
ALT: 29 IU/L (ref 0–44)
AST: 27 IU/L (ref 0–40)
Albumin/Globulin Ratio: 0.7 — ABNORMAL LOW (ref 1.2–2.2)
Albumin: 3.8 g/dL (ref 3.5–5.5)
Alkaline Phosphatase: 217 IU/L — ABNORMAL HIGH (ref 39–117)
BILIRUBIN TOTAL: 0.5 mg/dL (ref 0.0–1.2)
BUN/Creatinine Ratio: 20 (ref 9–20)
BUN: 19 mg/dL (ref 6–24)
CHLORIDE: 92 mmol/L — AB (ref 96–106)
CO2: 26 mmol/L (ref 20–29)
Calcium: 9.8 mg/dL (ref 8.7–10.2)
Creatinine, Ser: 0.95 mg/dL (ref 0.76–1.27)
GFR calc non Af Amer: 88 mL/min/{1.73_m2} (ref >59)
GFR, EST AFRICAN AMERICAN: 102 mL/min/{1.73_m2} (ref >59)
GLOBULIN, TOTAL: 5.3 g/dL — AB (ref 1.5–4.5)
GLUCOSE: 213 mg/dL — AB (ref 65–99)
Potassium: 5.4 mmol/L — ABNORMAL HIGH (ref 3.5–5.2)
SODIUM: 134 mmol/L (ref 134–144)
TOTAL PROTEIN: 9.1 g/dL — AB (ref 6.0–8.5)

## 2017-09-14 ENCOUNTER — Encounter (HOSPITAL_COMMUNITY): Payer: Self-pay

## 2017-09-14 ENCOUNTER — Telehealth: Payer: Self-pay | Admitting: Family Medicine

## 2017-09-14 DIAGNOSIS — E875 Hyperkalemia: Secondary | ICD-10-CM

## 2017-09-14 NOTE — Telephone Encounter (Signed)
Contact patient to advise to discontinue taking potassium supplements as he has an elevated level of potassium in his system. Continue all other medications as prescribed.  I would like for him to discontinue the potassium for over the next 5 days and return to clinic next Tuesday to have his potassium level rechecked.  Lab will be pending in the system.

## 2017-09-15 ENCOUNTER — Encounter (HOSPITAL_COMMUNITY): Payer: Self-pay

## 2017-09-15 ENCOUNTER — Ambulatory Visit (HOSPITAL_COMMUNITY)
Admit: 2017-09-15 | Discharge: 2017-09-15 | Disposition: A | Discharge status: Home / Self Care | Payer: Medicaid Other | Source: Ambulatory Visit | Attending: Cardiology | Admitting: Cardiology

## 2017-09-15 VITALS — BP 118/78 | HR 83 | Wt 225.4 lb

## 2017-09-15 DIAGNOSIS — I5032 Chronic diastolic (congestive) heart failure: Secondary | ICD-10-CM | POA: Diagnosis not present

## 2017-09-15 DIAGNOSIS — I2510 Atherosclerotic heart disease of native coronary artery without angina pectoris: Secondary | ICD-10-CM | POA: Insufficient documentation

## 2017-09-15 DIAGNOSIS — Z7901 Long term (current) use of anticoagulants: Secondary | ICD-10-CM | POA: Diagnosis not present

## 2017-09-15 DIAGNOSIS — Z87891 Personal history of nicotine dependence: Secondary | ICD-10-CM | POA: Diagnosis not present

## 2017-09-15 DIAGNOSIS — I059 Rheumatic mitral valve disease, unspecified: Secondary | ICD-10-CM | POA: Diagnosis not present

## 2017-09-15 DIAGNOSIS — Z952 Presence of prosthetic heart valve: Secondary | ICD-10-CM

## 2017-09-15 DIAGNOSIS — Z953 Presence of xenogenic heart valve: Secondary | ICD-10-CM | POA: Diagnosis not present

## 2017-09-15 DIAGNOSIS — M4622 Osteomyelitis of vertebra, cervical region: Secondary | ICD-10-CM | POA: Insufficient documentation

## 2017-09-15 DIAGNOSIS — Z6833 Body mass index (BMI) 33.0-33.9, adult: Secondary | ICD-10-CM | POA: Insufficient documentation

## 2017-09-15 DIAGNOSIS — E669 Obesity, unspecified: Secondary | ICD-10-CM | POA: Insufficient documentation

## 2017-09-15 DIAGNOSIS — Z79899 Other long term (current) drug therapy: Secondary | ICD-10-CM | POA: Diagnosis not present

## 2017-09-15 DIAGNOSIS — M4642 Discitis, unspecified, cervical region: Secondary | ICD-10-CM | POA: Diagnosis not present

## 2017-09-15 DIAGNOSIS — Z86718 Personal history of other venous thrombosis and embolism: Secondary | ICD-10-CM | POA: Diagnosis not present

## 2017-09-15 DIAGNOSIS — J449 Chronic obstructive pulmonary disease, unspecified: Secondary | ICD-10-CM | POA: Insufficient documentation

## 2017-09-15 DIAGNOSIS — Z7984 Long term (current) use of oral hypoglycemic drugs: Secondary | ICD-10-CM | POA: Insufficient documentation

## 2017-09-15 DIAGNOSIS — D638 Anemia in other chronic diseases classified elsewhere: Secondary | ICD-10-CM | POA: Insufficient documentation

## 2017-09-15 DIAGNOSIS — F1010 Alcohol abuse, uncomplicated: Secondary | ICD-10-CM | POA: Diagnosis not present

## 2017-09-15 DIAGNOSIS — E875 Hyperkalemia: Secondary | ICD-10-CM | POA: Diagnosis not present

## 2017-09-15 DIAGNOSIS — E119 Type 2 diabetes mellitus without complications: Secondary | ICD-10-CM | POA: Insufficient documentation

## 2017-09-15 DIAGNOSIS — Z885 Allergy status to narcotic agent status: Secondary | ICD-10-CM | POA: Insufficient documentation

## 2017-09-15 DIAGNOSIS — I714 Abdominal aortic aneurysm, without rupture: Secondary | ICD-10-CM | POA: Diagnosis not present

## 2017-09-15 DIAGNOSIS — Z8249 Family history of ischemic heart disease and other diseases of the circulatory system: Secondary | ICD-10-CM | POA: Insufficient documentation

## 2017-09-15 DIAGNOSIS — I330 Acute and subacute infective endocarditis: Secondary | ICD-10-CM

## 2017-09-15 MED ORDER — POTASSIUM CHLORIDE ER 20 MEQ PO TBCR: 20.0000 meq | EXTENDED_RELEASE_TABLET | Freq: Every day | ORAL | 1 refills | Status: DC

## 2017-09-15 NOTE — Progress Notes (Signed)
Advanced Heart Failure Clinic Note   Primary Cardiologist: Dr. Aundra Dubin   HPI:  Reginald [name] is a 57 y.o. male with h/o Diastolic CHF, severe MV disease, h/o MV endocarditis with vegetations, s/p MVR, AAA, COPD, DM2, Tobacco abuse, and ETOH abuse.   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 He was discharged to [facility] 08/10/17 on Taflaro and Daptomycin (Boradened due to persistent MRSA bacteremia despite supra therapeutic vancomycin levels) with plans for rehabilitation prior to planned MVR.  IM was re-consulted due to worsening respiratotory status requiring BiPAP and IV lasix.  HF team consulted for optimization prior to MVR. Diuresed and meds adjusted as tolerated. Pt underwent MVR with bioprosthetic valve 08/29/17. Milrinone weaned 08/31/17.  He presents today for post hospital follow up. Weight down 10 lbs from discharge. Feeling good from cardiac perspective.  He remains on IV ABX via [place]. He drives approx 1.5 hrs to get here. He denies SOB, lightheadedness, or exertional chest pain.  He remains sore at his sternotomy site, but markedly more so in his back and neck from his discitis.  He was given limited supply of pain meds. Has been managing with OTC meds.  Has not talked to surgery about pain. He is taking all medication as directed. He would like to establish with someone in [place] as it is much closer to his home.   Review of systems complete and found to be negative unless listed in HPI.    Past medical history 1. Chronic diastolic CHF: Now s/p MV replacement.   - Echo TEE 07/27/2017 2. 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 Last blood cultures 12/25 NGTD. He is on daptomycin.  - s/p MV replacement with bioprosthetic valve 08/29/2017 3. Chronic  Anemia - Required blood transfusion during Dec-Jan admission.  - FOBT was recently negative.  4. C1 osteomyelitis, C4 discitis and paraspinal abscess due to MRSA bacteremia. - Received course of IV daptomycin per ID recommendations.  5. DVT: Left PT vein (below knee). - Will need 3 months of coumadin.  6. Rhythm:  - Junctional rhythm post-op, no atrial fibrillation.  7. Tobacco abuse 8. ETOH abuse 9. DM2  Echo TEE 07/27/17 - LVEF 60-65% - Aortic valve: Trileaflet. Small echogenic mass at the tip of the non-coronary cusp. lambl&'s excrescences. Trivial AI. - Aorta: Grade 3 mobile atheroma of the proximal aortic arch. - Mitral valve: Large 2-3 cm mass of the A3 segment of the anterior leaflet, prolapsing across the posterior leaflet. There is a distinct posterior jet which is centrally directed of severe regurgitation, suggestive of perforation. There is dropout seen through the vegetation or in the area of the P3 scallop - not entirely clear, but concerning for possible perforation. There is vegetation to a lesser extent on the posterior leaflet tip. Effective regurgitant orifice (PISA): 1.01 cm^2. Regurgitant volume (PISA): 155 ml. - Left atrium: The atrium was dilated. No evidence of thrombus in the atrial cavity or appendage. - Right atrium: No evidence of thrombus in the atrial cavity or appendage. - Atrial septum: No defect or patent foramen ovale was identified. - Pulmonic valve: No evidence of vegetation.  R/LHC 08/02/2017 Mild non-obstructive CAD RHC Procedural Findings: Hemodynamics (mmHg) RA mean 8 RV 47/8 PA 57/19 PCWP 20 AO 119/66 Cardiac Output (Fick) 7.86 Cardiac Index (Fick) 3.52  Past Medical History:  Diagnosis  Date  . Acute osteomyelitis of cervical spine ([place]) 08/10/2017  . Alcohol abuse   . CAD (coronary artery disease)   . Diabetes mellitus type 2 in obese ([place])   . Mitral regurgitation   . Neck pain   . Paraspinal  abscess ([place]) 08/10/2017  . Tobacco abuse     Current Outpatient Medications  Medication Sig Dispense Refill  . acetaminophen (TYLENOL 8 HOUR) 650 MG CR tablet Take 1 tablet (650 mg total) by mouth every 8 (eight) hours as needed for pain. 90 tablet 2  . furosemide (LASIX) 80 MG tablet Take 1 tablet (80 mg total) by mouth daily. 30 tablet 1  . glipiZIDE (GLUCOTROL) 10 MG tablet Take 10 mg by mouth daily before breakfast.    . lisinopril (PRINIVIL,ZESTRIL) 5 MG tablet Take 1 tablet (5 mg total) by mouth daily. 30 tablet 1  . metFORMIN (GLUMETZA) 500 MG (MOD) 24 hr tablet Take 500 mg by mouth 2 (two) times daily with a meal.    . mupirocin ointment (BACTROBAN) 2 % Apply 1 application topically 2 (two) times daily. 60 g 0  . potassium chloride 20 MEQ TBCR Take 20 mEq by mouth 2 (two) times daily. 60 tablet 1  . spironolactone (ALDACTONE) 25 MG tablet Take 1 tablet (25 mg total) by mouth daily. 30 tablet 1  . warfarin (COUMADIN) 5 MG tablet Take 1 tablet (5 mg total) by mouth daily. As directed by coumadin clinic 100 tablet 1  . daptomycin (CUBICIN) IVPB Inject 1,000 mg into the vein daily. Indication:  Endocarditis Last Day of Therapy:  10/10/17 Labs - Once weekly:  CBC/D, BMP, and CPK Labs - Every other week:  ESR and CRP (Patient not taking: Reported on 09/11/2017) 35 Units 0  . folic acid (FOLVITE) 1 MG tablet Take 1 tablet (1 mg total) by mouth daily. (Patient not taking: Reported on 09/11/2017)    . oxyCODONE (OXY IR/ROXICODONE) 5 MG immediate release tablet Take 1 tablet (5 mg total) by mouth every 6 (six) hours as needed for severe pain. (Patient not taking: Reported on 09/11/2017) 30 tablet 0   No current facility-administered medications for this encounter.     Allergies  Allergen Reactions  . Morphine And Related Other (See Comments)    "freaks out"      Social History   Socioeconomic History  . Marital status: Married    Spouse name: Not on file  . Number of children: Not on  file  . Years of education: Not on file  . Highest education level: Not on file  Social Needs  . Financial resource strain: Not on file  . Food insecurity - worry: Not on file  . Food insecurity - inability: Not on file  . Transportation needs - medical: Not on file  . Transportation needs - non-medical: Not on file  Occupational History  . Not on file  Tobacco Use  . Smoking status: Former Smoker    Types: Cigarettes  . Smokeless tobacco: Never Used  Substance and Sexual Activity  . Alcohol use: No    Frequency: Never  . Drug use: No  . Sexual activity: Not Currently    Partners: Female  Other Topics Concern  . Not on file  Social History Narrative  . Not on file      Family History  Problem Relation Age of Onset  . Hypertension Mother   . Hypertension Father     Vitals:   09/15/17 0957  BP: 118/78  Pulse: 83  SpO2: 97%  Weight: 225 lb 6.4 oz (102.2 kg)   Wt Readings from Last 3 Encounters:  09/15/17 225 lb 6.4 oz (102.2 kg)  09/11/17 228 lb (103.4 kg)  09/06/17 235 lb 8 oz (106.8 kg)   PHYSICAL EXAM: General:  Well appearing. No respiratory difficulty HEENT: normal Neck: supple. no JVD. Carotids 2+ bilat; no bruits. No lymphadenopathy or thyromegaly appreciated. Cor: PMI nondisplaced. Regular rate & rhythm. No rubs, gallops or murmurs.Sternotomy site well healing. Lungs: CTAB, normal effort Abdomen: soft, nontender, nondistended. No hepatosplenomegaly. No bruits or masses. Good bowel sounds. Extremities: no cyanosis, clubbing, rash, edema. LUE PICC Neuro: alert & oriented x 3, cranial nerves grossly intact. moves all 4 extremities w/o difficulty. Affect pleasant.  ASSESSMENT & PLAN:  1.Chronic diastolic CHF: Now s/p MV replacement.   - NYHA II symptoms - Volume status stable on exam - Continue lasix 80 mg daily - Continue spiro 25 mg daily - Reinforced fluid restriction to < 2 L daily, sodium restriction to less than 2000 mg daily, and the importance  of daily weights.   2. 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 Last blood cultures 12/25 NGTD.  - Now s/p MV replacement with bioprosthetic valve.   - Stable. Follows up with surgery next month.   3. Anemia of chronic disease - CBC stable earlier this week.  4. DVT: Left PT vein (below knee). -  Will need 3 months anticoagulation for this.  Per PCP.  - INR 1.6 09/11/17. Dosing per coumadin clinic 5. Hyperkalemia - Decrease K to 20 meq daily. Needs repeat BMET via [place] next week.   Pt lives an hour and half a way and prefers not to follow up here. Will see in 2-3 months with Dr. Aundra Dubin, and then can likely follow up as needed. Needs cardiology follow up near his home.   Shirley Friar, PA-C 09/15/17   Greater than 50% of the 25 minute visit was spent in counseling/coordination of care regarding disease state education, salt/fluid restriction, sliding scale diuretics, and medication compliance.

## 2017-09-15 NOTE — Patient Instructions (Addendum)
No changes to medication at this time.  No lab work required today.  DECREASE Potassium to 20 meq tablet ONCE daily.  [facility]  Follow up 2-3 months with Dr. Shirlee LatchMcLean.  Take all medication as prescribed the day of your appointment. Bring all medications with you to your appointment.  Do the following things EVERYDAY: 1) Weigh yourself in the morning before breakfast. Write it down and keep it in a log. 2) Take your medicines as prescribed 3) Eat low salt foods-Limit salt (sodium) to 2000 mg per day.  4) Stay as active as you can everyday 5) Limit all fluids for the day to less than 2 liters

## 2017-09-17 DIAGNOSIS — I714 Abdominal aortic aneurysm, without rupture, unspecified: Secondary | ICD-10-CM | POA: Insufficient documentation

## 2017-09-18 ENCOUNTER — Telehealth (HOSPITAL_COMMUNITY): Payer: Self-pay

## 2017-09-18 ENCOUNTER — Telehealth: Payer: Self-pay | Admitting: Family Medicine

## 2017-09-18 NOTE — Telephone Encounter (Signed)
Lyla Sonarrie,  Please disregard phone message regarding hyperkalemia for patient. He has since followed up with cardiology and potasium dose has been decreased.   Godfrey PickKimberly S. Tiburcio PeaHarris, MSN, FNP-C [facility]  [address] [place], [place] [address] [phone]

## 2017-09-18 NOTE — Telephone Encounter (Signed)
Pt needs BMET, does have HHRN- but [facility] has denied having pt. Pt states RN is to come in tomorrow to draw INR, will call to have verbal orders given.

## 2017-09-19 ENCOUNTER — Ambulatory Visit (INDEPENDENT_AMBULATORY_CARE_PROVIDER_SITE_OTHER): Payer: Self-pay | Admitting: Cardiology

## 2017-09-19 DIAGNOSIS — Z952 Presence of prosthetic heart valve: Secondary | ICD-10-CM

## 2017-09-19 DIAGNOSIS — Z7901 Long term (current) use of anticoagulants: Secondary | ICD-10-CM

## 2017-09-19 DIAGNOSIS — I330 Acute and subacute infective endocarditis: Secondary | ICD-10-CM

## 2017-09-19 LAB — POCT INR: INR: 1.8

## 2017-09-19 NOTE — Patient Instructions (Signed)
Description   Today take a total of 10mg  (2 tablets) of Coumadin then start taking 7.5mg  (1.5 tablets) except 10mg  (2 tablets) on Mondays and Fridays.  Recheck INR in 1 week.  [facility], call Chales AbrahamsMary Ann [phone] with future redraw orders.

## 2017-09-20 ENCOUNTER — Telehealth: Payer: Self-pay

## 2017-09-20 NOTE — Telephone Encounter (Signed)
Home Health nurse, Chales AbrahamsMary Ann calling for order of biopatch for PICC dressings.   She was informed this is standard protocol for PICC dressing with our physicians.   Verbal order given for biopatch as needed for PICC dressing changes.    Laurell Josephsammy K King, RN

## 2017-09-26 ENCOUNTER — Ambulatory Visit (INDEPENDENT_AMBULATORY_CARE_PROVIDER_SITE_OTHER): Payer: Self-pay

## 2017-09-26 ENCOUNTER — Encounter (HOSPITAL_COMMUNITY): Payer: Self-pay

## 2017-09-26 ENCOUNTER — Telehealth: Payer: Self-pay | Admitting: Infectious Disease

## 2017-09-26 ENCOUNTER — Other Ambulatory Visit (HOSPITAL_COMMUNITY): Payer: Self-pay

## 2017-09-26 DIAGNOSIS — I330 Acute and subacute infective endocarditis: Secondary | ICD-10-CM

## 2017-09-26 DIAGNOSIS — Z7901 Long term (current) use of anticoagulants: Secondary | ICD-10-CM

## 2017-09-26 DIAGNOSIS — Z952 Presence of prosthetic heart valve: Secondary | ICD-10-CM

## 2017-09-26 LAB — POCT INR: INR: 1.6

## 2017-09-26 MED ORDER — SPIRONOLACTONE 25 MG PO TABS: 12.5000 mg | ORAL_TABLET | Freq: Every day | ORAL | 3 refills | Status: DC

## 2017-09-26 MED ORDER — LISINOPRIL 2.5 MG PO TABS: 2.5000 mg | ORAL_TABLET | Freq: Every day | ORAL | 3 refills | Status: DC

## 2017-09-26 MED ORDER — FUROSEMIDE 40 MG PO TABS: 40.0000 mg | ORAL_TABLET | Freq: Every day | ORAL | 3 refills | Status: DC

## 2017-09-26 NOTE — Telephone Encounter (Signed)
Reginald AbrahamsMary [name] [facility] called to get a stop date and pull order for this patient PICC. After review of chart and discharge orders advised her he is not our patient and we do not have a follow up visit scheduled for him. Asked her what doctor name is on the order and she advised Reginald [name]. Advised her of the doctors pager number as I could not find his name on the discharge summary either. She advised she will page the doctor for orders.

## 2017-09-26 NOTE — Patient Instructions (Signed)
Description   Spoke with Allen DerryMary Ann, [place] [facility] RN advised to have pt take 2 tablets today, then start taking 7.5mg  (1.5 tablets) except 10mg  (2 tablets) on Mondays, Wednesdays and Fridays.  Recheck INR in 1 week.  Call Chales AbrahamsMary Ann [phone] with future redraw orders.

## 2017-09-26 NOTE — Telephone Encounter (Signed)
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

## 2017-09-26 NOTE — Telephone Encounter (Signed)
[facility].

## 2017-09-27 NOTE — Telephone Encounter (Signed)
OK perfect thanks Feliz Beamravis!

## 2017-09-28 LAB — FUNGUS CULTURE WITH STAIN

## 2017-09-28 LAB — FUNGAL ORGANISM REFLEX

## 2017-09-28 LAB — FUNGUS CULTURE RESULT

## 2017-10-03 ENCOUNTER — Ambulatory Visit (INDEPENDENT_AMBULATORY_CARE_PROVIDER_SITE_OTHER): Payer: Self-pay | Admitting: Cardiovascular Disease

## 2017-10-03 ENCOUNTER — Telehealth: Payer: Self-pay

## 2017-10-03 ENCOUNTER — Other Ambulatory Visit: Payer: Self-pay | Admitting: Cardiothoracic Surgery

## 2017-10-03 DIAGNOSIS — I669 Occlusion and stenosis of unspecified cerebral artery: Secondary | ICD-10-CM

## 2017-10-03 DIAGNOSIS — Z5181 Encounter for therapeutic drug level monitoring: Secondary | ICD-10-CM

## 2017-10-03 DIAGNOSIS — Z7901 Long term (current) use of anticoagulants: Secondary | ICD-10-CM

## 2017-10-03 DIAGNOSIS — Z952 Presence of prosthetic heart valve: Secondary | ICD-10-CM

## 2017-10-03 DIAGNOSIS — I76 Septic arterial embolism: Secondary | ICD-10-CM

## 2017-10-03 DIAGNOSIS — I330 Acute and subacute infective endocarditis: Secondary | ICD-10-CM

## 2017-10-03 LAB — POCT INR: INR: 2.2

## 2017-10-03 NOTE — Patient Instructions (Signed)
Description   Spoke with Laddie AquasBeth, [place] [facility] Nurse advised to have pt continue same dose of coumadin 7.5mg  (1.5 tablets)  daily except 10mg  (2 tablets) on Mondays, Wednesdays and Fridays.  Recheck INR in 2 weeks.  Instructed to call with any new medications [phone] and instructed to call if he is discharged before next scheduled INR check

## 2017-10-03 NOTE — Telephone Encounter (Signed)
Reginald [name] with [facility] called to notify Dr. Donata ClayVan [name] of his sternal wound.  He has a small opening at his sternal wound measuring 1 cm wide by 0.5 cm deep.  Reginald [name] is going to be seen in the office tomorrow 10/04/2017 and will be evaluated then.

## 2017-10-04 ENCOUNTER — Encounter: Payer: Self-pay | Admitting: Infectious Diseases

## 2017-10-04 ENCOUNTER — Ambulatory Visit (INDEPENDENT_AMBULATORY_CARE_PROVIDER_SITE_OTHER): Payer: Self-pay | Admitting: Cardiothoracic Surgery

## 2017-10-04 ENCOUNTER — Ambulatory Visit (INDEPENDENT_AMBULATORY_CARE_PROVIDER_SITE_OTHER): Payer: Self-pay | Admitting: Infectious Diseases

## 2017-10-04 ENCOUNTER — Encounter: Payer: Self-pay | Admitting: Cardiothoracic Surgery

## 2017-10-04 ENCOUNTER — Telehealth: Payer: Self-pay

## 2017-10-04 ENCOUNTER — Other Ambulatory Visit: Payer: Self-pay

## 2017-10-04 ENCOUNTER — Ambulatory Visit
Admission: RE | Admit: 2017-10-04 | Discharge: 2017-10-04 | Disposition: A | Discharge status: Home / Self Care | Payer: Self-pay | Source: Ambulatory Visit | Attending: Cardiothoracic Surgery | Admitting: Cardiothoracic Surgery

## 2017-10-04 VITALS — BP 99/69 | HR 84 | Resp 18 | Ht 69.0 in | Wt 230.0 lb

## 2017-10-04 DIAGNOSIS — Z952 Presence of prosthetic heart valve: Secondary | ICD-10-CM

## 2017-10-04 DIAGNOSIS — T81328A Disruption or dehiscence of closure of other specified internal operation (surgical) wound, initial encounter: Secondary | ICD-10-CM

## 2017-10-04 DIAGNOSIS — I058 Other rheumatic mitral valve diseases: Secondary | ICD-10-CM

## 2017-10-04 DIAGNOSIS — T8132XA Disruption of internal operation (surgical) wound, not elsewhere classified, initial encounter: Secondary | ICD-10-CM

## 2017-10-04 DIAGNOSIS — I059 Rheumatic mitral valve disease, unspecified: Secondary | ICD-10-CM

## 2017-10-04 DIAGNOSIS — M4620 Osteomyelitis of vertebra, site unspecified: Secondary | ICD-10-CM

## 2017-10-04 DIAGNOSIS — E1142 Type 2 diabetes mellitus with diabetic polyneuropathy: Secondary | ICD-10-CM

## 2017-10-04 DIAGNOSIS — Z7901 Long term (current) use of anticoagulants: Secondary | ICD-10-CM

## 2017-10-04 MED ORDER — DOXYCYCLINE HYCLATE 100 MG PO TABS: 100.0000 mg | ORAL_TABLET | Freq: Two times a day (BID) | ORAL | 6 refills | Status: DC

## 2017-10-04 NOTE — Assessment & Plan Note (Signed)
He appears to be doing well Will change him to doxy at end of IV rx, given he had significant burden of disease.  Would like to get MRI of neck, will check with pt assistance to see if we can repeat.  Will f/u with lab on his CK Will see him back in 6 weeks Consider repeat BCx then.

## 2017-10-04 NOTE — Progress Notes (Signed)
   Subjective:    Patient ID: Reginald [name], male    DOB: 07/20/1961, 57 y.o.   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 His course was made more difficult by persistent bacteremia. His was initially treated with vanco then changed to dapto/rifampin/ceftaroline. On 12-20 he was change to dapto alone after he had finally cleared his BCx. Marland Kitchen.   He was d/c to rehab and then back into hospital 12-30 with SOB. He underwent MVR 08-29-17. Cx and g/s at that time were (-). He was d/c home on 09-06-17, on daptomycin.  He states he still feels some grinding in his neck ("bone to bone").  No numbness or weakness in his hands or feet. "Mostly just dizzy spells".  [facility] He states his stop date is 2-20. No problems with PIC.   He has had opening in his wound where suture on his sternum came open.  He is to see Dr Zenaida NieceVan Tright directly after this.   Has not had f/u imaging of his neck, "I don't have no insurance and have a quarter million dollar bill".   CK normal (09-19-17).  FSG have been 80-126  Review of Systems  Constitutional: Negative for appetite change, chills, fever and unexpected weight change.  Respiratory: Negative for cough and shortness of breath.   Cardiovascular: Negative for chest pain.  Gastrointestinal: Negative for blood in stool and constipation.  Genitourinary: Negative for difficulty urinating.  Musculoskeletal: Positive for myalgias. Negative for arthralgias.  Neurological: Positive for dizziness.  Hematological: Does not bruise/bleed easily.  occas back pain.  Please see HPI. All other systems reviewed and negative.     Objective:   Physical Exam  Constitutional: He is oriented to person, place, and time. He appears well-developed and well-nourished.  HENT:  Mouth/Throat: No oropharyngeal exudate.  Eyes: EOM are normal.  Pupils are equal, round, and reactive to light.  Neck: Neck supple.  Cardiovascular: Normal rate, regular rhythm and normal heart sounds.  Pulmonary/Chest: Effort normal and breath sounds normal.    Abdominal: Soft. Bowel sounds are normal. There is no tenderness. There is no rebound.  Musculoskeletal: He exhibits no edema.  Lymphadenopathy:    He has no cervical adenopathy.  Neurological: He is alert and oriented to person, place, and time.  Skin:     Psychiatric: He has a normal mood and affect.         Assessment & Plan:

## 2017-10-04 NOTE — Progress Notes (Signed)
PCP is Scot Jun, FNP Referring Provider is Debara Pickett Nadean Corwin, MD  Chief Complaint  Patient presents with  . 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 Patient had a biologic valve replacement. He is on Coumadin for 3 months. He is being treated with IV daptomycin until February 20 and followed by ID. He has a PICC line and is followed by the [facility]. Patient presents with a superficial sternal infection of the upper portion of his incision which involves the skin and subcutaneous tissue only. This was opened cultured and packed with saline wet-to-dry. We'll continue daily dressing changes through the home health nurse and follow this with weekly clinic visits. He will continue his IV daptomycin and will not need oral antibiotics pending results of culture.  Chest x-ray today is clear. Patient is in sinus rhythm. Otherwise the lower incision is well-healed and there is no peripheral edema.  Past Medical History:  Diagnosis Date  . Acute osteomyelitis of cervical spine ([place]) 08/10/2017  . Alcohol abuse   . CAD (coronary artery disease)   . Diabetes mellitus type 2 in obese ([place])   . Mitral regurgitation   . Neck pain   . 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 Tobacco abuse     Past Surgical History:  Procedure Laterality Date  . HERNIA REPAIR  2007  . MITRAL VALVE REPLACEMENT N/A 08/29/2017   Procedure: MITRAL VALVE (MV) REPLACEMENT;  Surgeon: Ivin Poot, MD;  Location: [place];  Service: Open Heart Surgery;  Laterality: N/A;  . MULTIPLE EXTRACTIONS WITH ALVEOLOPLASTY N/A 08/07/2017   Procedure: Extraction of tooth #'s 6,11,12,14,19-29 and 32 with alveoloplasty;  Surgeon: Lenn Cal, DDS;  Location: [place];  Service: Oral Surgery;  Laterality: N/A;  . RIGHT/LEFT HEART CATH AND CORONARY ANGIOGRAPHY N/A 08/02/2017   Procedure: RIGHT/LEFT HEART CATH AND CORONARY ANGIOGRAPHY;  Surgeon: Burnell Blanks, MD;  Location: [place] CV LAB;  Service: Cardiovascular;  Laterality: N/A;  . TEE WITHOUT CARDIOVERSION N/A 08/29/2017   Procedure: TRANSESOPHAGEAL ECHOCARDIOGRAM (TEE);  Surgeon: Prescott Gum, Collier Salina, MD;  Location: [place];  Service: Open Heart Surgery;  Laterality: N/A;  . Tonsillectomy /adnoidectomy     as aa child    Family History  Problem Relation Age of Onset  . Hypertension Mother   . Hypertension Father     Social History Social History   Tobacco Use  . Smoking status: Former Smoker    Types: Cigarettes  . Smokeless tobacco: Never Used  Substance Use Topics  . Alcohol use: No    Frequency: Never  . Drug use: No    Current Outpatient Medications  Medication Sig Dispense Refill  . acetaminophen (TYLENOL 8 HOUR) 650 MG CR tablet Take 1 tablet (650 mg total) by mouth every 8 (eight) hours as needed for pain. 90 tablet 2  . daptomycin (CUBICIN) IVPB Inject 1,000 mg into the vein daily. Indication:  Endocarditis Last Day of Therapy:  10/10/17 Labs - Once weekly:  CBC/D, BMP, and CPK Labs - Every other week:  ESR and CRP 35 Units 0  . [START ON 10/12/2017] doxycycline (VIBRA-TABS) 100 MG tablet Take 1 tablet (100 mg total) by mouth 2 (two) times daily. 60 tablet 6  . folic acid (FOLVITE) 1 MG tablet Take 1 tablet (1 mg total) by mouth daily.    . furosemide (  LASIX) 40 MG tablet Take 1 tablet (40 mg total) by mouth daily. 30 tablet 3  . glipiZIDE (GLUCOTROL) 10 MG tablet Take 10 mg by mouth daily before breakfast.    . lisinopril (PRINIVIL,ZESTRIL) 2.5 MG tablet Take 1 tablet (2.5 mg total) by mouth daily. 30 tablet 3  . metFORMIN (GLUMETZA) 500 MG (MOD) 24 hr tablet Take 500 mg by mouth 2 (two) times daily with a meal.    . mupirocin ointment (BACTROBAN) 2 % Apply 1 application topically 2 (two) times daily. 60 g 0  . oxyCODONE (OXY IR/ROXICODONE) 5 MG immediate release tablet Take 1  tablet (5 mg total) by mouth every 6 (six) hours as needed for severe pain. 30 tablet 0  . Potassium Chloride ER 20 MEQ TBCR Take 20 mEq by mouth daily. 60 tablet 1  . spironolactone (ALDACTONE) 25 MG tablet Take 0.5 tablets (12.5 mg total) by mouth daily. 15 tablet 3  . warfarin (COUMADIN) 5 MG tablet Take 1 tablet (5 mg total) by mouth daily. As directed by coumadin clinic 100 tablet 1   No current facility-administered medications for this visit.     Allergies  Allergen Reactions  . Morphine And Related Other (See Comments)    "freaks out"    Review of Systems  Patient still having some incisional pain and was given a written prescription for 30 tablets of oxycodone and no more will be provided.  BP 99/69 (BP Location: Right Arm, Patient Position: Sitting, Cuff Size: Large)   Pulse 84   Resp 18   Ht '5\' 9"'$  (1.753 m)   Wt 230 lb (104.3 kg)   SpO2 96% Comment: RA  BMI 33.97 kg/m  Physical Exam      Exam    General- alert and comfortable    Neck- no JVD, no cervical adenopathy palpable, no carotid bruit   Lungs- clear without rales, wheezes    Sternal incision with superficial separation with some purulence and exposed subcutaneous tissue. No exposed sternal wire or fashion. This was cleaned and treated with wet-to-dry saline dressing.   Cor- regular rate and rhythm, no murmur , gallop   Abdomen- soft, non-tender   Extremities - warm, non-tender, minimal edema   Neuro- oriented, appropriate, no focal weakness   Diagnostic Tests: Chest x-ray clear  Impression: Doing well after mitral valve replacement for MRSA endocarditis Superficial sternal wound infection will be treated with daily wet-to-dry packing by home health and monitored with weekly office visit.  Plan: Return in one week for wound check. Patient can do normal activity but no lifting more than 20 pounds. Continue current medications.   Len Childs, MD [facility] [phone]

## 2017-10-04 NOTE — Telephone Encounter (Addendum)
Per Dr. Ninetta LightsHatcher I called the pt see if he had insurance and if so with who. When I called the pt to ask him if he had any type of insurance he stated that he was uninsured. He asked why I needed this information, I informed him that Dr. Ninetta LightsHatcher was wanting to order a MRI with contrast of the neck, but it would require insurance to help with the cost. Pt stated that he did not want a MRI or any type of imaging done. Stated that he's had multiple imaging done and nothing came back abnormal to his knowledge. Wanted me to inform Dr. Ninetta LightsHatcher not to order the MRI. I told the pt I would inform Dr. Ninetta LightsHatcher of the pt's decision. Lorenso CourierJose L Nupur Hohman, [place]

## 2017-10-04 NOTE — Assessment & Plan Note (Signed)
Will work on Company secretaryre-imaging.  Continue anbx

## 2017-10-04 NOTE — Assessment & Plan Note (Signed)
He will meet with Dr Zenaida NieceVan Tright momentarily.  Will remain on anbx.

## 2017-10-04 NOTE — Assessment & Plan Note (Signed)
Control is much better.  Appreciate PCP f/u.

## 2017-10-04 NOTE — Assessment & Plan Note (Addendum)
His dose may need to change after starting on doxy.  His daughter will call coumadin clinic to let them know.

## 2017-10-06 ENCOUNTER — Telehealth: Payer: Self-pay

## 2017-10-06 NOTE — Telephone Encounter (Signed)
B[facility] [phone]) called to say that Mr. Reginald FakeHazelwoods girlfriend would be changing his sternal wound dressing daily and would be going by there today to observe her changing his dressing.  She also stated that he is aware of his one week follow-up and PICC line to be removed on the 19th after antibiotics are completed.

## 2017-10-10 ENCOUNTER — Telehealth: Payer: Self-pay

## 2017-10-10 ENCOUNTER — Other Ambulatory Visit: Payer: Self-pay

## 2017-10-10 DIAGNOSIS — T8149XA Infection following a procedure, other surgical site, initial encounter: Secondary | ICD-10-CM

## 2017-10-10 NOTE — Telephone Encounter (Signed)
Home Health nurse, Beth called to say Reginald [name] did not want his PICC line dressing changed because he was having the line removed tomorrow at the office.  Also the PICC line did not give any blood return to draw labs.  She stated that she attempted to draw via venipuncture but the vein blew.  Advised patient to go to [facility] in the morning before his appointment with us and have his labs drawn there.  Beth acknowledged receipt and relayed information to patient.  Will see him for a follow-up appointment tomorrow, 10/11/2017.

## 2017-10-11 ENCOUNTER — Ambulatory Visit: Payer: Self-pay | Admitting: Cardiothoracic Surgery

## 2017-10-13 ENCOUNTER — Other Ambulatory Visit (HOSPITAL_COMMUNITY): Payer: Self-pay | Admitting: Cardiology

## 2017-10-13 ENCOUNTER — Ambulatory Visit: Payer: Self-pay | Admitting: Cardiothoracic Surgery

## 2017-10-14 LAB — ACID FAST CULTURE WITH REFLEXED SENSITIVITIES (MYCOBACTERIA): Acid Fast Culture: NEGATIVE

## 2017-10-16 ENCOUNTER — Other Ambulatory Visit (HOSPITAL_COMMUNITY): Payer: Self-pay | Admitting: Cardiology

## 2017-10-17 ENCOUNTER — Ambulatory Visit: Payer: Self-pay | Admitting: Cardiology

## 2017-10-17 DIAGNOSIS — Z952 Presence of prosthetic heart valve: Secondary | ICD-10-CM

## 2017-10-17 DIAGNOSIS — Z7901 Long term (current) use of anticoagulants: Secondary | ICD-10-CM

## 2017-10-17 LAB — POCT INR: INR: 1.7

## 2017-10-18 ENCOUNTER — Encounter: Payer: Self-pay | Admitting: Cardiothoracic Surgery

## 2017-10-18 ENCOUNTER — Telehealth: Payer: Self-pay | Admitting: *Deleted

## 2017-10-18 ENCOUNTER — Ambulatory Visit (INDEPENDENT_AMBULATORY_CARE_PROVIDER_SITE_OTHER): Payer: Self-pay | Admitting: Cardiothoracic Surgery

## 2017-10-18 VITALS — BP 132/85 | HR 88 | Temp 97.8°F | Resp 20 | Ht 69.0 in | Wt 242.0 lb

## 2017-10-18 DIAGNOSIS — T8132XA Disruption of internal operation (surgical) wound, not elsewhere classified, initial encounter: Secondary | ICD-10-CM

## 2017-10-18 DIAGNOSIS — Z952 Presence of prosthetic heart valve: Secondary | ICD-10-CM

## 2017-10-18 MED ORDER — METOPROLOL TARTRATE 25 MG PO TABS: 12.5000 mg | ORAL_TABLET | Freq: Two times a day (BID) | ORAL | 1 refills | Status: DC

## 2017-10-18 MED ORDER — GLIPIZIDE 10 MG PO TABS: 10.0000 mg | ORAL_TABLET | Freq: Every day | ORAL | 0 refills | Status: DC

## 2017-10-18 MED ORDER — OXYCODONE-ACETAMINOPHEN 5-325 MG PO TABS: 1.0000 | ORAL_TABLET | Freq: Four times a day (QID) | ORAL | 0 refills | Status: DC | PRN

## 2017-10-18 NOTE — Progress Notes (Signed)
PCP is Bing Neighbors, FNP Referring Provider is Rennis Golden Lisette Abu, MD  Chief Complaint  Patient presents with  . Routine Post Op    2 week f/u for sternal wound check    HPI: The patient returns for wound check after mitral valve replacement for endocarditis.  Maintaining sinus rhythm. He developed a superficial skin separation which is being treated with daily dressing changes and is granulating very well.  He is taking oral antibiotics.  He is taking Coumadin.  He is a patient Dr. Elmon Else.  Patient is ready to start driving, shower, and do normal daily activities.  He may not lift more than 20 pounds.  He will be given a prescription for metoprolol since apparently he is not taking it now as well as refill for his glipizide and one last prescription for oxycodone.  Past Medical History:  Diagnosis Date  . Acute osteomyelitis of cervical spine ([facility]) 08/10/2017  . Alcohol abuse   . CAD (coronary artery disease)   . Diabetes mellitus type 2 in obese ([facility])   . Mitral regurgitation   . Neck pain   . 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 Tobacco abuse     Past Surgical History:  Procedure Laterality Date  . HERNIA REPAIR  2007  . MITRAL VALVE REPLACEMENT N/A 08/29/2017   Procedure: MITRAL VALVE (MV) REPLACEMENT;  Surgeon: Kerin Perna, MD;  Location: [facility] OR;  Service: Open Heart Surgery;  Laterality: N/A;  . MULTIPLE EXTRACTIONS WITH ALVEOLOPLASTY N/A 08/07/2017   Procedure: Extraction of tooth #'s 6,11,12,14,19-29 and 32 with alveoloplasty;  Surgeon: Charlynne Pander, DDS;  Location: [facility];  Service: Oral Surgery;  Laterality: N/A;  . RIGHT/LEFT HEART CATH AND CORONARY ANGIOGRAPHY N/A 08/02/2017   Procedure: RIGHT/LEFT HEART CATH AND CORONARY ANGIOGRAPHY;  Surgeon: Kathleene Hazel, MD;  [facility]: [facility] INV[facility] LAB;  Service: Cardiovascular;  Laterality: N/A;  . TEE WITHOUT CARDIOVERSION N/A 08/29/2017   Procedure: TRANSESOPHAGEAL ECHOCARDIOGRAM (TEE);  Surgeon:  Donata Clay, Theron Arista, MD;  Location: [facility] OR;  Service: Open Heart Surgery;  Laterality: N/A;  . Tonsillectomy /adnoidectomy     as aa child    Family History  Problem Relation Age of Onset  . Hypertension Mother   . Hypertension Father     Social History Social History   Tobacco Use  . Smoking status: Former Smoker    Types: Cigarettes  . Smokeless tobacco: Never Used  Substance Use Topics  . Alcohol use: No    Frequency: Never  . Drug use: No    Current Outpatient Medications  Medication Sig Dispense Refill  . acetaminophen (TYLENOL 8 HOUR) 650 MG CR tablet Take 1 tablet (650 mg total) by mouth every 8 (eight) hours as needed for pain. 90 tablet 2  . doxycycline (VIBRA-TABS) 100 MG tablet Take 1 tablet (100 mg total) by mouth 2 (two) times daily. 60 tablet 6  . folic acid (FOLVITE) 1 MG tablet Take 1 tablet (1 mg total) by mouth daily.    . furosemide (LASIX) 40 MG tablet Take 1 tablet (40 mg total) by mouth daily. 30 tablet 3  . glipiZIDE (GLUCOTROL) 10 MG tablet Take 10 mg by mouth daily before breakfast.    . lisinopril (PRINIVIL,ZESTRIL) 2.5 MG tablet Take 1 tablet (2.5 mg total) by mouth daily. 30 tablet 3  . metFORMIN (GLUMETZA) 500 MG (MOD) 24 hr tablet Take 500 mg by mouth 2 (two) times daily with a meal.    . mupirocin  ointment (BACTROBAN) 2 % Apply 1 application topically 2 (two) times daily. 60 g 0  . oxyCODONE (OXY IR/ROXICODONE) 5 MG immediate release tablet Take 1 tablet (5 mg total) by mouth every 6 (six) hours as needed for severe pain. 30 tablet 0  . Potassium Chloride ER 20 MEQ TBCR Take 20 mEq by mouth daily. 60 tablet 1  . spironolactone (ALDACTONE) 25 MG tablet Take 0.5 tablets (12.5 mg total) by mouth daily. 15 tablet 3  . warfarin (COUMADIN) 5 MG tablet Take 1 tablet (5 mg total) by mouth daily. As directed by coumadin clinic 100 tablet 1  . glipiZIDE (GLUCOTROL) 10 MG tablet Take 1 tablet (10 mg total) by mouth daily before breakfast. 30 tablet 0  .  metoprolol tartrate (LOPRESSOR) 25 MG tablet Take 0.5 tablets (12.5 mg total) by mouth 2 (two) times daily. 60 tablet 1  . oxyCODONE-acetaminophen (PERCOCET/ROXICET) 5-325 MG tablet Take 1 tablet by mouth every 6 (six) hours as needed for severe pain. 50 tablet 0   No current facility-administered medications for this visit.     Allergies  Allergen Reactions  . Morphine And Related Other (See Comments)    "freaks out"    Review of Systems  Generally improved no symptoms of heart failure BP 132/85   Pulse 88   Temp 97.8 F (36.6 C) (Oral)   Resp 20   Ht 5\' 9"  (1.753 m)   Wt 242 lb (109.8 kg)   SpO2 98% Comment: RA  BMI 35.74 kg/m  Physical Exam      Exam    General- alert and comfortable    Neck- no JVD, no cervical adenopathy palpable, no carotid bruit   Lungs- clear without rales, wheezes   Cor- regular rate and rhythm, no murmur , gallop   Abdomen- soft, non-tender   Extremities - warm, non-tender, minimal edema   Neuro- oriented, appropriate, no focal weakness   Diagnostic Tests:  None Impression: Making great progress Superficial sternal wound skin separation healing well and involves only the dermis now  Plan: Return for wound check in 3 weeks.   Mikey BussingPeter Van Trigt III, MD [facility] [phone]

## 2017-10-18 NOTE — Telephone Encounter (Signed)
[facility] pt is being d/c from [facility] today. Therefore, pt will need to make an appt in the Coumadin Clinic.  She asked if pt could just go the a free standing lab & have it done, advised that the pt new to us & has to be seen in the Clinic as labs take a couple of days to result & then we have to track down the pt and the labs.  Advised Beth that we will be more than happy to set an appt for the pt & to have the pt call the number she dialed to the Coumadin Clinic.

## 2017-10-18 NOTE — Patient Instructions (Signed)
Start metoprolol 12.5 mg twice daily Continue oxycodone as directed -no more refills will be provided Continue glipizide until established with primary care physician  Continue daily wound dressing with saline wet-to-dry You may drive lift up to 20 pounds maximum and shower daily

## 2017-10-20 NOTE — Telephone Encounter (Signed)
Spoke with pt and he again asks if can have INR checked there as he is over an hour away . Instructed that we have no coumadin clinics there Appt made for him to have INR checked on March 5th and he states understanding

## 2017-10-24 ENCOUNTER — Ambulatory Visit (INDEPENDENT_AMBULATORY_CARE_PROVIDER_SITE_OTHER): Payer: Self-pay | Admitting: *Deleted

## 2017-10-24 DIAGNOSIS — I330 Acute and subacute infective endocarditis: Secondary | ICD-10-CM

## 2017-10-24 DIAGNOSIS — Z952 Presence of prosthetic heart valve: Secondary | ICD-10-CM

## 2017-10-24 DIAGNOSIS — Z7901 Long term (current) use of anticoagulants: Secondary | ICD-10-CM

## 2017-10-24 LAB — POCT INR: INR: 1.8

## 2017-10-24 NOTE — Patient Instructions (Signed)
Description   Today take 2 tablets, then change your dose to 2 tablets daily except 1.5 tablets on Sundays, Tuesdays and Thursdays.  Recheck INR in 9 days.  Instructed to call with any new medications [phone].

## 2017-11-02 ENCOUNTER — Ambulatory Visit (INDEPENDENT_AMBULATORY_CARE_PROVIDER_SITE_OTHER): Payer: Self-pay | Admitting: *Deleted

## 2017-11-02 DIAGNOSIS — I330 Acute and subacute infective endocarditis: Secondary | ICD-10-CM

## 2017-11-02 DIAGNOSIS — Z952 Presence of prosthetic heart valve: Secondary | ICD-10-CM

## 2017-11-02 DIAGNOSIS — Z5181 Encounter for therapeutic drug level monitoring: Secondary | ICD-10-CM

## 2017-11-02 DIAGNOSIS — Z7901 Long term (current) use of anticoagulants: Secondary | ICD-10-CM

## 2017-11-02 LAB — POCT INR: INR: 1.5

## 2017-11-02 NOTE — Patient Instructions (Addendum)
Description   Today March 14th  take 2 tablets (10mg ) then continue same dose of coumadin  2 tablets daily except 1.5 tablets on Sundays, Tuesdays and Thursdays.  Recheck INR on March 20th.  Instructed to call with any new medications [phone]. Missed dose on Monday of this week

## 2017-11-06 ENCOUNTER — Encounter (HOSPITAL_COMMUNITY): Payer: Self-pay | Admitting: Cardiology

## 2017-11-06 ENCOUNTER — Ambulatory Visit (HOSPITAL_COMMUNITY)
Admission: RE | Admit: 2017-11-06 | Discharge: 2017-11-06 | Disposition: A | Discharge status: Home / Self Care | Payer: Medicaid Other | Source: Ambulatory Visit | Attending: Cardiology | Admitting: Cardiology

## 2017-11-06 ENCOUNTER — Other Ambulatory Visit: Payer: Self-pay

## 2017-11-06 ENCOUNTER — Ambulatory Visit (HOSPITAL_BASED_OUTPATIENT_CLINIC_OR_DEPARTMENT_OTHER)
Admission: RE | Admit: 2017-11-06 | Discharge: 2017-11-06 | Disposition: A | Discharge status: Home / Self Care | Payer: Medicaid Other | Source: Ambulatory Visit | Attending: Cardiology | Admitting: Cardiology

## 2017-11-06 VITALS — BP 146/74 | HR 74 | Wt 243.5 lb

## 2017-11-06 DIAGNOSIS — Z952 Presence of prosthetic heart valve: Secondary | ICD-10-CM

## 2017-11-06 DIAGNOSIS — I5032 Chronic diastolic (congestive) heart failure: Secondary | ICD-10-CM | POA: Insufficient documentation

## 2017-11-06 DIAGNOSIS — I2510 Atherosclerotic heart disease of native coronary artery without angina pectoris: Secondary | ICD-10-CM | POA: Diagnosis not present

## 2017-11-06 LAB — CBC
HCT: 41.3 % (ref 39.0–52.0)
HEMOGLOBIN: 13.4 g/dL (ref 13.0–17.0)
MCH: 27.1 pg (ref 26.0–34.0)
MCHC: 32.4 g/dL (ref 30.0–36.0)
MCV: 83.4 fL (ref 78.0–100.0)
Platelets: 181 10*3/uL (ref 150–400)
RBC: 4.95 MIL/uL (ref 4.22–5.81)
RDW: 15.5 % (ref 11.5–15.5)
WBC: 5.2 10*3/uL (ref 4.0–10.5)

## 2017-11-06 LAB — ECHOCARDIOGRAM COMPLETE: WEIGHTICAEL: 3896 [oz_av]

## 2017-11-06 LAB — BASIC METABOLIC PANEL
Anion gap: 10 (ref 5–15)
BUN: 10 mg/dL (ref 6–20)
CHLORIDE: 106 mmol/L (ref 101–111)
CO2: 22 mmol/L (ref 22–32)
CREATININE: 0.83 mg/dL (ref 0.61–1.24)
Calcium: 9.2 mg/dL (ref 8.9–10.3)
GFR calc non Af Amer: >60 mL/min (ref >60)
Glucose, Bld: 101 mg/dL — ABNORMAL HIGH (ref 65–99)
Potassium: 4.4 mmol/L (ref 3.5–5.1)
SODIUM: 138 mmol/L (ref 135–145)

## 2017-11-06 MED ORDER — METOPROLOL TARTRATE 25 MG PO TABS: 25.0000 mg | ORAL_TABLET | Freq: Two times a day (BID) | ORAL | 1 refills | Status: DC

## 2017-11-06 MED ORDER — FUROSEMIDE 40 MG PO TABS: 20.0000 mg | ORAL_TABLET | Freq: Every day | ORAL | 3 refills | Status: DC

## 2017-11-06 NOTE — Patient Instructions (Addendum)
Stop Potassium   Stop Warfarin on 11/21/2017  Start Asprin 81 mg 11/22/2017  Increase Metoprolol 25 mg (1 tab), twice a day  Decrease furosemide 20 mg (1 tab) daily  Have echocardiogram today   Labs drawn today (if we do not call you, then your lab work was stable)   Your physician recommends that you schedule a follow-up appointment in: 3 months with Dr. Shirlee LatchMcLean

## 2017-11-06 NOTE — Progress Notes (Signed)
Advanced Heart Failure Clinic Note   Primary Cardiologist: Dr. Shirlee LatchMcLean   HPI:  Reginald [name] is a 57 y.o. 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 [facility] on Taflaro and Daptomycin (Broadened due to persistent MRSA bacteremia despite supra-therapeutic vancomycin levels).  IM was re-consulted due to worsening respiratotory status requiring BiPAP and IV lasix.  HF team consulted for optimization prior to MVR. Diuresed and meds adjusted. Pt underwent MVR with bioprosthetic valve 08/29/17. Milrinone weaned off 08/31/17.  He presents today for followup of MV disease.  He remains on suppressive doxycycline per ID.  Energy level is improving.  Occasional lightheaded spell (rare).  Walking a lot, no exertional dyspnea or chest pain.  No BRBPR/melena.  Working on Museum/gallery curatorgetting insurance.  BP is high today but he just took his meds.  He denies palpitations.  Echo was done today and showed EF 60-65%, stable bioprosthetic mitral valve.   Review of systems complete and found to be negative unless listed in HPI.    Past medical history 1. Chronic diastolic CHF: Now s/p MV replacement.   - Echo (3/19): EF 60-65%, mild LVH, moderate diastolic dysfunction, bioprosthetic mitral valve appears to function normally.  2. 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  - s/p MV replacement with bioprosthetic valve 08/29/2017 (Dr. Donata ClayVan Trigt).  3. Chronic Anemia: Required blood transfusion during Dec-Jan admission.  - FOBT negative.  4. C1 osteomyelitis, C4 discitis and paraspinal abscess due to MRSA  bacteremia. - Received course of IV daptomycin per ID recommendations.  5. DVT: Left PT vein (below knee) in 1/19.  6. Junctional rhythm post-op MV replacement, resolved.   7. Prior smoker 8. ETOH abuse 9. DM2 10. Aortic atheroma.  11. LHC (12/18): Mild nonobstructive CAD.  12. 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  13. COPD: 1/19 PFTs with severe obstructive disease. Prior smoker.    Current Outpatient Medications  Medication Sig Dispense Refill  . acetaminophen (TYLENOL 8 HOUR) 650 MG CR tablet Take 1 tablet (650 mg total) by mouth every 8 (eight) hours as needed for pain. 90 tablet 2  . doxycycline (VIBRA-TABS) 100 MG tablet Take 1 tablet (100 mg total) by mouth 2 (two) times daily. 60 tablet 6  . folic acid (FOLVITE) 1 MG tablet Take 1 tablet (1 mg total) by mouth daily.    . furosemide (LASIX) 40 MG tablet Take 0.5 tablets (20 mg total) by mouth daily. 30 tablet 3  . glipiZIDE (GLUCOTROL) 10 MG tablet Take 10 mg by mouth daily before breakfast.    . lisinopril (PRINIVIL,ZESTRIL) 2.5 MG tablet Take 1 tablet (2.5 mg total) by mouth daily. 30 tablet 3  . metFORMIN (GLUMETZA) 500 MG (MOD) 24 hr tablet Take 500 mg by mouth 2 (two) times daily with a meal.    . metoprolol tartrate (LOPRESSOR) 25 MG tablet Take 1 tablet (25 mg total) by mouth 2 (two) times daily. 60 tablet 1  . mupirocin ointment (BACTROBAN) 2 % Apply 1 application topically 2 (two) times daily. 60 g 0  . oxyCODONE (OXY IR/ROXICODONE) 5 MG immediate release tablet Take 1 tablet (5 mg total) by  mouth every 6 (six) hours as needed for severe pain. 30 tablet 0  . oxyCODONE-acetaminophen (PERCOCET/ROXICET) 5-325 MG tablet Take 1 tablet by mouth every 6 (six) hours as needed for severe pain. 50 tablet 0  . potassium chloride SA (K-DUR,KLOR-CON) 20 MEQ tablet TAKE 1 TABLET BY MOUTH TWICE (2) DAILY 60 tablet 2  . spironolactone (ALDACTONE) 25 MG tablet Take 0.5 tablets (12.5 mg total) by mouth daily. 15  tablet 3   No current facility-administered medications for this encounter.     Allergies  Allergen Reactions  . Morphine And Related Other (See Comments)    "freaks out"      Social History   Socioeconomic History  . Marital status: Married    Spouse name: Not on file  . Number of children: Not on file  . Years of education: Not on file  . Highest education level: Not on file  Social Needs  . Financial resource strain: Not on file  . Food insecurity - worry: Not on file  . Food insecurity - inability: Not on file  . Transportation needs - medical: Not on file  . Transportation needs - non-medical: Not on file  Occupational History  . Not on file  Tobacco Use  . Smoking status: Former Smoker    Types: Cigarettes  . Smokeless tobacco: Never Used  Substance and Sexual Activity  . Alcohol use: No    Frequency: Never  . Drug use: No  . Sexual activity: Not Currently    Partners: Female  Other Topics Concern  . Not on file  Social History Narrative  . Not on file    Family History  Problem Relation Age of Onset  . Hypertension Mother   . Hypertension Father     Vitals:   11/06/17 1110  BP: (!) 146/74  Pulse: 74  SpO2: 98%  Weight: 243 lb 8 oz (110.5 kg)   Wt Readings from Last 3 Encounters:  11/06/17 243 lb 8 oz (110.5 kg)  10/18/17 242 lb (109.8 kg)  10/04/17 230 lb (104.3 kg)   PHYSICAL EXAM: General: NAD Neck: No JVD, no thyromegaly or thyroid nodule.  Lungs: Mildly distant BS.  CV: Nondisplaced PMI.  Heart regular S1/S2, no S3/S4, no murmur.  No peripheral edema.  No carotid bruit.  Normal pedal pulses.  Abdomen: Soft, nontender, no hepatosplenomegaly, no distention.  Skin: Intact without lesions or rashes.  Neurologic: Alert and oriented x 3.  Psych: Normal affect. Extremities: No clubbing or cyanosis.  HEENT: Normal.   ASSESSMENT & PLAN: 1.Chronic diastolic CHF: Now s/p MV replacement, minimal dyspnea now.  He is not volume overloaded on  exam.  - Decrease Lasix to 20 mg daily, he may be able to stop at next appt. BMET today.  - Can stop KCl.  - Continue spiro 12.5 mg daily - Increase metoprolol to 25 mg bid (hard to cut in half for 12.5 mg bid and HR in 70s).    2. 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 Now s/p MV replacement with bioprosthetic valve.  Valve was stable on post-op echo done today.  - He remains on suppressive doxycycline per ID.  - He will need antibiotics with dental work.   3. DVT: Left PT vein (below knee) in 1/19.  Will need 3 months anticoagulation for this.   - He can stop warfarin 11/21/17 and start ASA 81 mg daily.  4. CKD: Stage 3.  Repeat BMET today.  5. COPD: Severe obstructive disease on  1/19 PFTs.  He is no longer smoking.  6. Thoracic aortic aneurysm: 4.2 cm in 12/18. Would do MRA chest in 12/19 to follow.  7. Aortic atheroma: Significant.  Would benefit from statin, will add at next appt.  8. Junctional rhythm: Noted post-op then resolved.   Followup in 3 months.   Marca Ancona, MD 11/06/17

## 2017-11-08 ENCOUNTER — Ambulatory Visit (INDEPENDENT_AMBULATORY_CARE_PROVIDER_SITE_OTHER): Payer: Self-pay | Admitting: Cardiothoracic Surgery

## 2017-11-08 ENCOUNTER — Encounter: Payer: Self-pay | Admitting: Cardiothoracic Surgery

## 2017-11-08 ENCOUNTER — Ambulatory Visit (INDEPENDENT_AMBULATORY_CARE_PROVIDER_SITE_OTHER): Payer: Self-pay | Admitting: *Deleted

## 2017-11-08 VITALS — BP 127/78 | HR 69 | Resp 20 | Ht 69.0 in | Wt 243.0 lb

## 2017-11-08 DIAGNOSIS — T8132XS Disruption of internal operation (surgical) wound, not elsewhere classified, sequela: Secondary | ICD-10-CM

## 2017-11-08 DIAGNOSIS — Z952 Presence of prosthetic heart valve: Secondary | ICD-10-CM

## 2017-11-08 DIAGNOSIS — Z7901 Long term (current) use of anticoagulants: Secondary | ICD-10-CM

## 2017-11-08 DIAGNOSIS — I330 Acute and subacute infective endocarditis: Secondary | ICD-10-CM

## 2017-11-08 LAB — POCT INR: INR: 2.4

## 2017-11-08 NOTE — Progress Notes (Signed)
PCP is Bing NeighborsHarris, Kimberly S, FNP Referring Provider is Rennis GoldenHilty, Lisette AbuKenneth C, MD  Chief Complaint  Patient presents with  . Routine Post Op    3 week f/u for sternal wound check    HPI: Final postop visit after mitral valve replacement with a bioprosthetic valve for MRSA endocarditis early January 2019.  Prior to surgery the patient had septic embolic stroke as well as osteomyelitis of his spine and required multiple dental extractions.  He has now recovered well.  He is followed in the advanced heart failure clinic for diastolic heart failure.  Echocardiogram performed 2 days ago shows good LV function, normal functioning bioprosthetic mitral valve.  The patient had postop atrial fibrillation and will stop taking his anticoagulation April 2-3 months postop.  A superficial upper sternal incision wound infection has now healed.  Postop chest x-ray is clear.  The patient is not smoking.  He denies any symptoms of heart failure.  He does have some orthostatic dizziness.  He does have some problems with memory probably related to his preoperative stroke. Past Medical History:  Diagnosis Date  . Acute osteomyelitis of cervical spine (HCC) 08/10/2017  . Alcohol abuse   . CAD (coronary artery disease)   . Diabetes mellitus type 2 in obese (HCC)   . Mitral regurgitation   . Neck pain   . 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 Tobacco abuse     Past Surgical History:  Procedure Laterality Date  . HERNIA REPAIR  2007  . MITRAL VALVE REPLACEMENT N/A 08/29/2017   Procedure: MITRAL VALVE (MV) REPLACEMENT;  Surgeon: Kerin PernaVan Trigt, Peter, MD;  [facility];  Service: Open Heart Surgery;  Laterality: N/A;  . MULTIPLE EXTRACTIONS WITH ALVEOLOPLASTY N/A 08/07/2017   Procedure: Extraction of tooth #'s 6,11,12,14,19-29 and 32 with alveoloplasty;  Surgeon: Charlynne PanderKulinski, Ronald F, DDS;  [facility];  Service: Oral Surgery;  Laterality: N/A;  . RIGHT/LEFT HEART CATH AND CORONARY ANGIOGRAPHY N/A 08/02/2017    Procedure: RIGHT/LEFT HEART CATH AND CORONARY ANGIOGRAPHY;  Surgeon: Kathleene HazelMcAlhany, Christopher D, MD;  Location: [facility] INVASIVE CV LAB;  Service: Cardiovascular;  Laterality: N/A;  . TEE WITHOUT CARDIOVERSION N/A 08/29/2017   Procedure: TRANSESOPHAGEAL ECHOCARDIOGRAM (TEE);  Surgeon: Donata ClayVan Trigt, Theron AristaPeter, MD;  [facility];  Service: Open Heart Surgery;  Laterality: N/A;  . Tonsillectomy /adnoidectomy     as aa child    Family History  Problem Relation Age of Onset  . Hypertension Mother   . Hypertension Father     Social History Social History   Tobacco Use  . Smoking status: Former Smoker    Types: Cigarettes  . Smokeless tobacco: Never Used  Substance Use Topics  . Alcohol use: No    Frequency: Never  . Drug use: No    Current Outpatient Medications  Medication Sig Dispense Refill  . acetaminophen (TYLENOL 8 HOUR) 650 MG CR tablet Take 1 tablet (650 mg total) by mouth every 8 (eight) hours as needed for pain. 90 tablet 2  . doxycycline (VIBRA-TABS) 100 MG tablet Take 1 tablet (100 mg total) by mouth 2 (two) times daily. 60 tablet 6  . folic acid (FOLVITE) 1 MG tablet Take 1 tablet (1 mg total) by mouth daily.    . furosemide (LASIX) 40 MG tablet Take 0.5 tablets (20 mg total) by mouth daily. 30 tablet 3  . glipiZIDE (GLUCOTROL) 10 MG tablet Take 10 mg by mouth daily before breakfast.    . lisinopril (PRINIVIL,ZESTRIL) 2.5 MG tablet Take 1 tablet (  2.5 mg total) by mouth daily. 30 tablet 3  . metFORMIN (GLUMETZA) 500 MG (MOD) 24 hr tablet Take 500 mg by mouth 2 (two) times daily with a meal.    . metoprolol tartrate (LOPRESSOR) 25 MG tablet Take 1 tablet (25 mg total) by mouth 2 (two) times daily. 60 tablet 1  . mupirocin ointment (BACTROBAN) 2 % Apply 1 application topically 2 (two) times daily. 60 g 0  . spironolactone (ALDACTONE) 25 MG tablet Take 0.5 tablets (12.5 mg total) by mouth daily. 15 tablet 3   No current facility-administered medications for this visit.     Allergies   Allergen Reactions  . Morphine And Related Other (See Comments)    "freaks out"    Review of Systems  Weight stable. No ankle swelling. No fever. No back pain. Walking better, regaining exercise tolerance  BP 127/78   Pulse 69   Resp 20   Ht 5\' 9"  (1.753 m)   Wt 243 lb (110.2 kg)   SpO2 97% Comment: RA  BMI 35.88 kg/m  Physical Exam      Exam    General- alert and comfortable    Neck- no JVD, no cervical adenopathy palpable, no carotid bruit   Lungs- clear without rales, wheezes   Cor- regular rate and rhythm, no murmur , gallop   Abdomen- soft, non-tender   Extremities - warm, non-tender, minimal edema   Neuro- oriented, appropriate, no focal weakness   Diagnostic Tests: Echocardiogram images personally reviewed showing good LV function, normal functioning bio-prosthetic mitral valve  Impression: Doing well 1/2 months postop MVR for endocarditis  Plan: Patient may resume normal activities 3 months after surgery at which time sternal precautions will be lifted.  The importance of a heart healthy diet and heart healthy lifestyle and compliance with his medications were stressed with the patient and his daughter.  He will be followed in the cardiology clinic and return here as needed.  He has mild dilatation of the ascending aorta 4.2 cm.  If that reaches over 4.5 cm I will follow him in this office.   [name], MD [facility] [phone]

## 2017-11-08 NOTE — Patient Instructions (Signed)
Description   Continue same dose of coumadin  2 tablets daily except 1.5 tablets on Sundays, Tuesdays and Thursdays. He can stop warfarin 11/21/17 and start ASA 81 mg daily per Dr Alford HighlandMclean's office note from 11/06/17. Instructed to call with any new medications [phone].

## 2017-11-15 ENCOUNTER — Encounter: Payer: Self-pay | Admitting: Infectious Diseases

## 2017-11-15 ENCOUNTER — Other Ambulatory Visit: Payer: Self-pay | Admitting: Infectious Diseases

## 2017-11-15 ENCOUNTER — Ambulatory Visit (INDEPENDENT_AMBULATORY_CARE_PROVIDER_SITE_OTHER): Payer: Self-pay | Admitting: Infectious Diseases

## 2017-11-15 ENCOUNTER — Encounter (HOSPITAL_COMMUNITY): Payer: Self-pay | Admitting: Cardiology

## 2017-11-15 DIAGNOSIS — I058 Other rheumatic mitral valve diseases: Secondary | ICD-10-CM

## 2017-11-15 DIAGNOSIS — E1142 Type 2 diabetes mellitus with diabetic polyneuropathy: Secondary | ICD-10-CM

## 2017-11-15 DIAGNOSIS — I059 Rheumatic mitral valve disease, unspecified: Secondary | ICD-10-CM

## 2017-11-15 DIAGNOSIS — M4622 Osteomyelitis of vertebra, cervical region: Secondary | ICD-10-CM

## 2017-11-15 DIAGNOSIS — T8132XS Disruption of internal operation (surgical) wound, not elsewhere classified, sequela: Secondary | ICD-10-CM

## 2017-11-15 DIAGNOSIS — I669 Occlusion and stenosis of unspecified cerebral artery: Secondary | ICD-10-CM

## 2017-11-15 DIAGNOSIS — I76 Septic arterial embolism: Secondary | ICD-10-CM

## 2017-11-15 DIAGNOSIS — R413 Other amnesia: Secondary | ICD-10-CM

## 2017-11-15 NOTE — Assessment & Plan Note (Signed)
Wound is healed on my exam, no tenderness. No fluctuance.

## 2017-11-15 NOTE — Assessment & Plan Note (Signed)
FSG have been well controlled (70-130 per pt) Will f/u with PCP.

## 2017-11-15 NOTE — Assessment & Plan Note (Signed)
Suspect he may have component of PTSD/depression after his prolonged illness, multiple significant surgeries.  He can f/u with PCP

## 2017-11-15 NOTE — Assessment & Plan Note (Signed)
Has no weakness, no f/c.  He has no pain. Mild stiffness.  He has not had further imaging.  Given length of his anbx, lack of sx, will defer further imaging.

## 2017-11-15 NOTE — Assessment & Plan Note (Signed)
Will recheck his BCx today.

## 2017-11-15 NOTE — Progress Notes (Signed)
   Subjective:    Patient ID: Reginald [name], male    DOB: 10/22/1960, 57 y.o.   MRN: [id number]  HPI 57 yo M with hx of MRSA bacteremia with, pneumonia, splenic and CNS emboli, MV IE, AV IE and osteomyelitis of C1-4 as well as paraspinal abscess. His course was made more difficult by persistent bacteremia. He was initially treated with vanco then changed to dapto/rifampin/ceftaroline. On 12-20 he was change to dapto alone after he had finally cleared his BCx. Marland Kitchen.   He was d/c to rehab and then back into hospital 12-30 with SOB. He underwent MVR 08-29-17. Cx and g/s at that time were (-). He was d/c home on 09-06-17, on daptomycin.   He was seen in ID f/u 2-13 and had noted a suture had come loose. He saw Dr Morton PetersVan Tright and this has since healed. He has been released by [facility].  He was started on doxy after he completed his IV anbx (4-20).  He has been on coumadin, end date 11-21-17.   Today feels like his memory is worse and has been tired.   Review of Systems  Constitutional: Negative for chills, fever and unexpected weight change.  Respiratory: Negative for cough and shortness of breath.   Cardiovascular: Negative for chest pain.  Gastrointestinal: Negative for constipation and diarrhea.  Genitourinary: Negative for difficulty urinating.  Musculoskeletal: Positive for neck stiffness. Negative for back pain and neck pain.  Neurological: Positive for dizziness. Negative for weakness, numbness and headaches.  Hematological: Does not bruise/bleed easily.  Please see HPI. All other systems reviewed and negative.      Objective:   Physical Exam  Constitutional: He is oriented to person, place, and time. He appears well-developed and well-nourished.  HENT:  Mouth/Throat: No oropharyngeal exudate.  Eyes: Pupils are equal, round, and reactive to light. EOM are normal.  Neck: Neck supple.  Cardiovascular: Normal rate, regular rhythm and normal heart sounds.  Pulmonary/Chest: Effort normal and  breath sounds normal.  Abdominal: Soft. Bowel sounds are normal. There is no tenderness. There is no rebound.  Musculoskeletal: He exhibits no edema.  Lymphadenopathy:    He has no cervical adenopathy.  Neurological: He is alert and oriented to person, place, and time.  Psychiatric: He has a normal mood and affect.       Assessment & Plan:

## 2017-11-15 NOTE — Assessment & Plan Note (Signed)
He appears to be doing well Will stop his doxy today Will repeat his BCx Will rtc prn (explained to rtc if fever, chills, worsening back or neck pain).

## 2017-11-21 LAB — CULTURE, BLOOD (SINGLE)
MICRO NUMBER:: 90382338
Result:: NO GROWTH
SPECIMEN QUALITY:: ADEQUATE

## 2017-11-27 ENCOUNTER — Ambulatory Visit (INDEPENDENT_AMBULATORY_CARE_PROVIDER_SITE_OTHER): Payer: Self-pay | Admitting: Family Medicine

## 2017-11-27 ENCOUNTER — Encounter: Payer: Self-pay | Admitting: Family Medicine

## 2017-11-27 VITALS — BP 116/68 | HR 68 | Temp 98.0°F | Ht 69.0 in | Wt 251.0 lb

## 2017-11-27 DIAGNOSIS — R413 Other amnesia: Secondary | ICD-10-CM

## 2017-11-27 DIAGNOSIS — I10 Essential (primary) hypertension: Secondary | ICD-10-CM

## 2017-11-27 DIAGNOSIS — Z7289 Other problems related to lifestyle: Secondary | ICD-10-CM

## 2017-11-27 DIAGNOSIS — Z789 Other specified health status: Secondary | ICD-10-CM

## 2017-11-27 DIAGNOSIS — G931 Anoxic brain damage, not elsewhere classified: Secondary | ICD-10-CM

## 2017-11-27 DIAGNOSIS — E118 Type 2 diabetes mellitus with unspecified complications: Secondary | ICD-10-CM

## 2017-11-27 LAB — POCT URINALYSIS DIP (MANUAL ENTRY)
BILIRUBIN UA: NEGATIVE
BILIRUBIN UA: NEGATIVE mg/dL
Glucose, UA: NEGATIVE mg/dL
Nitrite, UA: NEGATIVE
PH UA: 5.5 (ref 5.0–8.0)
Protein Ur, POC: NEGATIVE mg/dL
Spec Grav, UA: 1.02 (ref 1.010–1.025)
Urobilinogen, UA: 0.2 E.U./dL

## 2017-11-27 LAB — POCT GLYCOSYLATED HEMOGLOBIN (HGB A1C): Hemoglobin A1C: 5

## 2017-11-27 MED ORDER — LISINOPRIL 2.5 MG PO TABS: 2.5000 mg | ORAL_TABLET | Freq: Every day | ORAL | 1 refills | Status: DC

## 2017-11-27 MED ORDER — METFORMIN HCL ER (MOD) 500 MG PO TB24: 500.0000 mg | ORAL_TABLET | Freq: Every day | ORAL | 1 refills | Status: DC

## 2017-11-27 NOTE — Progress Notes (Signed)
poct

## 2017-11-27 NOTE — Progress Notes (Signed)
Patient ID: Reginald [name], male    DOB: 13-Aug-1961, 57 y.o.   MRN: [id number]  PCP: Bing Neighbors, FNP  Chief Complaint  Patient presents with  . Follow-up    3 month on chronic condition    Subjective:  HPI Reginald [name] is a 57 y.o. 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 Prior to recent hospitalization he had not received any routine medical care due to lack of insurance. He is accompanied today by his wife. Pride recently discontinued Warfarin and transitioned to aspirin 81 mg per cardiology. He report overall doing ok.  He continues to experience fatigue along with impaired memory since his hospitalization last year.  He also complains of some dizziness which occurs regardless of activity.  He reports since his hospitalization in December he has difficulty remembering simple things such as putting items together as he routinely has been able to repair equipment without effort.  He did suffer hypoxia and respiratory distress during his December hospitalization and associates development of impair memory with illness. He has remained compliant with medications although admits to continued daily drinking of alcohol. He recently has began to engage in yard work since Coumadin was discontinued. He is hydrating well and appetite is consistent with baseline. No recent falls and endorses occasionally feeling slightly off-balance. Currently completing last course of antibiotic endocarditis, MRSA, and wound infection. Denies that he is experiencing any pain, chest pain, shortness of breath, new weakness, or headaches. Social History   Socioeconomic History  . Marital status: Married    Spouse name: Not  on file  . Number of children: Not on file  . Years of education: Not on file  . Highest education level: Not on file  Occupational History  . Not on file  Social Needs  . Financial resource strain: Not on file  . Food insecurity:    Worry: Not on file    Inability: Not on file  . Transportation needs:    Medical: Not on file    Non-medical: Not on file  Tobacco Use  . Smoking status: Former Smoker    Types: Cigarettes  . Smokeless tobacco: Never Used  Substance and Sexual Activity  . Alcohol use: No    Frequency: Never  . Drug use: No  . Sexual activity: Not Currently    Partners: Female  Lifestyle  . Physical activity:    Days per week: Not on file    Minutes per session: Not on file  . Stress: Not on file  Relationships  . Social connections:    Talks on phone: Not on file    Gets together: Not on file    Attends religious service: Not on file    Active member of club or organization: Not on file    Attends meetings of clubs or organizations: Not on file    Relationship status: Not on file  . Intimate partner violence:    Fear of current or ex partner: Not on file    Emotionally abused: Not on file    Physically abused: Not on file    Forced sexual activity: Not on file  Other Topics Concern  . Not on file  Social History Narrative  . Not on file    Family  History  Problem Relation Age of Onset  . Hypertension Mother   . Hypertension Father    Review of Systems Pertinent negatives listed in HPI  Patient Active Problem List   Diagnosis Date Noted  . Memory change 11/15/2017  . Sternal wound dehiscence 10/04/2017  . AAA (abdominal aortic aneurysm) (HCC) 09/17/2017  . Chronic diastolic heart failure (HCC) 09/15/2017  . Long term (current) use of anticoagulants [Z79.01] 09/07/2017  . S/P MVR (mitral valve replacement) 08/29/2017  . SOB (shortness of breath)   . Lethargy   . Benign essential HTN   . Vascular headache   . Acute blood loss anemia   .  Essential hypertension   . Type 2 diabetes mellitus with peripheral neuropathy (HCC)   . Chronic pain syndrome   . Septic embolism (HCC) 08/10/2017  . Acute osteomyelitis of cervical spine (HCC) 08/10/2017  . 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 Hypertensive heart disease with heart failure (HCC)   . Endotracheal tube present   . Acute respiratory disease   . Acute respiratory failure (HCC) 07/26/2017  . Alcohol abuse 07/26/2017  . Thrombocytopenia (HCC) 07/26/2017  . Abnormal liver enzymes 07/26/2017  . Acute encephalopathy 07/26/2017  . Endocarditis of mitral valve   . Cerebral septic emboli (HCC)     Allergies  Allergen Reactions  . Morphine And Related Other (See Comments)    "freaks out"    Prior to Admission medications   Medication Sig Start Date End Date Taking? Authorizing Provider  acetaminophen (TYLENOL 8 HOUR) 650 MG CR tablet Take 1 tablet (650 mg total) by mouth every 8 (eight) hours as needed for pain. 09/11/17  Yes Bing NeighborsHarris, Glorious Flicker S, FNP  doxycycline (VIBRA-TABS) 100 MG tablet Take 1 tablet (100 mg total) by mouth 2 (two) times daily. 10/12/17  Yes Ginnie SmartHatcher, Jeffrey C, MD  folic acid (FOLVITE) 1 MG tablet Take 1 tablet (1 mg total) by mouth daily. 08/11/17  Yes Danford, Earl Liteshristopher P, MD  furosemide (LASIX) 40 MG tablet Take 0.5 tablets (20 mg total) by mouth daily. 11/06/17  Yes Laurey MoraleMcLean, Dalton S, MD  glipiZIDE (GLUCOTROL) 10 MG tablet Take 10 mg by mouth daily before breakfast.   Yes [provider]  lisinopril (PRINIVIL,ZESTRIL) 2.5 MG tablet Take 1 tablet (2.5 mg total) by mouth daily. 09/26/17  Yes Laurey MoraleMcLean, Dalton S, MD  metFORMIN (GLUMETZA) 500 MG (MOD) 24 hr tablet Take 500 mg by mouth 2 (two) times daily with a meal.   Yes [provider]  metoprolol tartrate (LOPRESSOR) 25 MG tablet Take 1 tablet (25 mg total) by mouth 2 (two) times daily. 11/06/17  Yes Laurey MoraleMcLean, Dalton S, MD  spironolactone (ALDACTONE) 25 MG tablet Take 0.5 tablets (12.5 mg total)  by mouth daily. 09/26/17  Yes Laurey MoraleMcLean, Dalton S, MD    Past Medical, Surgical Family and Social History reviewed and updated.    Objective:   Today's Vitals   11/27/17 1048  BP: 116/68  Pulse: 68  Temp: 98 F (36.7 C)  TempSrc: Oral  SpO2: 96%  Weight: 251 lb (113.9 kg)  Height: 5\' 9"  (1.753 m)    Wt Readings from Last 3 Encounters:  11/27/17 251 lb (113.9 kg)  11/15/17 254 lb (115.2 kg)  11/08/17 243 lb (110.2 kg)    Physical Exam Constitutional: Patient appears well-developed and well-nourished. No distress. HENT: Normocephalic, atraumatic, External right and left ear normal. Oropharynx is clear and moist.  Eyes: Conjunctivae and EOM are normal. PERRLA, no scleral icterus. Neck: Normal ROM.  Neck supple. No JVD. No tracheal deviation. No thyromegaly. CVS: RRR, S1/S2 +, no murmurs, no gallops, no carotid bruit.  Pulmonary: Effort and breath sounds normal, no stridor, rhonchi, wheezes, rales.  Abdominal: Soft. BS +, no distension, tenderness, rebound or guarding.  Musculoskeletal: Normal range of motion. No edema and no tenderness.  Lymphadenopathy: No lymphadenopathy noted, cervical, inguinal or axillary Neuro: Alert. Normal reflexes, muscle tone coordination. No cranial nerve deficit. Skin: Skin is warm and dry. No rash noted. Not diaphoretic. No erythema. No pallor. Psychiatric: Normal mood and affect. Behavior, judgment, thought content normal.    Assessment & Plan:  1. Type 2 diabetes mellitus with complication, unspecified whether long term insulin use (HCC), A1C 5.0, well controlled-Discontinue Glipizide  -Continue metformin 500 mg once daily with breakfast.  - POCT urinalysis dipstick   2. Benign essential HTN, well controlled today. No changes. We have discussed target BP range and blood pressure goal. I have advised patient to check BP regularly and to call us back or report to clinic if the numbers are consistently higher than 140/90. We discussed the importance  of compliance with medical therapy and DASH diet recommended, consequences of uncontrolled hypertension discussed.  - continue current BP medications   3. Memory loss, suspect secondary to chronic alcohol use combined with brain injury secondary to hypoxia.  4. Hypoxic brain injury [facility]) Will defer to neurology for second opinion. Ambulatory referral to Neurology  5. Alcohol Use , chronic, not ready to quit. Encouraged cessation to improve overall health outcomes. Recent LFTs within normal range.    Meds ordered this encounter  Medications  . metFORMIN (GLUMETZA) 500 MG (MOD) 24 hr tablet    Sig: Take 1 tablet (500 mg total) by mouth daily with breakfast.    Dispense:  90 tablet    Refill:  1    Order Specific Question:   Supervising Provider    Answer:   Quentin Angst [id number]  . lisinopril (PRINIVIL,ZESTRIL) 2.5 MG tablet    Sig: Take 1 tablet (2.5 mg total) by mouth daily.    Dispense:  90 tablet    Refill:  1    Order Specific Question:   Supervising Provider    Answer:   Quentin Angst [id number]    Orders Placed This Encounter  Procedures  . Ambulatory referral to Neurology  . POCT urinalysis dipstick  . POCT glycosylated hemoglobin (Hb A1C)    RTC: 6 months for chronic condition management  Godfrey Pick. Tiburcio Pea, MSN, FNP-C [facility]  [address] [place], [place] [address] [phone]

## 2017-12-12 ENCOUNTER — Ambulatory Visit: Payer: Self-pay | Admitting: Family Medicine

## 2017-12-25 ENCOUNTER — Other Ambulatory Visit (HOSPITAL_COMMUNITY): Payer: Self-pay | Admitting: Cardiology

## 2018-01-01 ENCOUNTER — Other Ambulatory Visit (HOSPITAL_COMMUNITY): Payer: Self-pay | Admitting: Cardiology

## 2018-02-08 ENCOUNTER — Ambulatory Visit (HOSPITAL_COMMUNITY)
Admission: RE | Admit: 2018-02-08 | Discharge: 2018-02-08 | Disposition: A | Discharge status: Home / Self Care | Payer: Medicaid Other | Source: Ambulatory Visit | Attending: Cardiology | Admitting: Cardiology

## 2018-02-08 VITALS — BP 186/100 | HR 68 | Wt 259.4 lb

## 2018-02-08 DIAGNOSIS — I5032 Chronic diastolic (congestive) heart failure: Secondary | ICD-10-CM | POA: Diagnosis not present

## 2018-02-08 DIAGNOSIS — Z952 Presence of prosthetic heart valve: Secondary | ICD-10-CM | POA: Diagnosis not present

## 2018-02-08 DIAGNOSIS — Z885 Allergy status to narcotic agent status: Secondary | ICD-10-CM | POA: Diagnosis not present

## 2018-02-08 DIAGNOSIS — I712 Thoracic aortic aneurysm, without rupture: Secondary | ICD-10-CM | POA: Insufficient documentation

## 2018-02-08 DIAGNOSIS — N183 Chronic kidney disease, stage 3 (moderate): Secondary | ICD-10-CM | POA: Insufficient documentation

## 2018-02-08 DIAGNOSIS — Z8249 Family history of ischemic heart disease and other diseases of the circulatory system: Secondary | ICD-10-CM | POA: Insufficient documentation

## 2018-02-08 DIAGNOSIS — Z86718 Personal history of other venous thrombosis and embolism: Secondary | ICD-10-CM | POA: Diagnosis not present

## 2018-02-08 DIAGNOSIS — Z7982 Long term (current) use of aspirin: Secondary | ICD-10-CM | POA: Diagnosis not present

## 2018-02-08 DIAGNOSIS — I700 Atherosclerosis of aorta: Secondary | ICD-10-CM | POA: Diagnosis not present

## 2018-02-08 DIAGNOSIS — I130 Hypertensive heart and chronic kidney disease with heart failure and stage 1 through stage 4 chronic kidney disease, or unspecified chronic kidney disease: Secondary | ICD-10-CM | POA: Diagnosis not present

## 2018-02-08 DIAGNOSIS — I2510 Atherosclerotic heart disease of native coronary artery without angina pectoris: Secondary | ICD-10-CM | POA: Insufficient documentation

## 2018-02-08 DIAGNOSIS — Z79899 Other long term (current) drug therapy: Secondary | ICD-10-CM | POA: Insufficient documentation

## 2018-02-08 DIAGNOSIS — Z87891 Personal history of nicotine dependence: Secondary | ICD-10-CM | POA: Insufficient documentation

## 2018-02-08 DIAGNOSIS — Z953 Presence of xenogenic heart valve: Secondary | ICD-10-CM | POA: Diagnosis not present

## 2018-02-08 DIAGNOSIS — J449 Chronic obstructive pulmonary disease, unspecified: Secondary | ICD-10-CM | POA: Diagnosis not present

## 2018-02-08 DIAGNOSIS — E1122 Type 2 diabetes mellitus with diabetic chronic kidney disease: Secondary | ICD-10-CM | POA: Diagnosis not present

## 2018-02-08 DIAGNOSIS — Z7984 Long term (current) use of oral hypoglycemic drugs: Secondary | ICD-10-CM | POA: Insufficient documentation

## 2018-02-08 LAB — BASIC METABOLIC PANEL
Anion gap: 10 (ref 5–15)
BUN: 10 mg/dL (ref 6–20)
CALCIUM: 9 mg/dL (ref 8.9–10.3)
CO2: 29 mmol/L (ref 22–32)
CREATININE: 0.89 mg/dL (ref 0.61–1.24)
Chloride: 99 mmol/L — ABNORMAL LOW (ref 101–111)
GFR calc Af Amer: >60 mL/min (ref >60)
Glucose, Bld: 193 mg/dL — ABNORMAL HIGH (ref 65–99)
POTASSIUM: 4.1 mmol/L (ref 3.5–5.1)
SODIUM: 138 mmol/L (ref 135–145)

## 2018-02-08 LAB — LIPID PANEL
CHOLESTEROL: 165 mg/dL (ref 0–200)
HDL: 32 mg/dL — ABNORMAL LOW (ref >40)
LDL Cholesterol: 59 mg/dL (ref 0–99)
Total CHOL/HDL Ratio: 5.2 RATIO
Triglycerides: 370 mg/dL — ABNORMAL HIGH (ref <150)
VLDL: 74 mg/dL — ABNORMAL HIGH (ref 0–40)

## 2018-02-08 MED ORDER — LISINOPRIL 10 MG PO TABS: 10.0000 mg | ORAL_TABLET | Freq: Every day | ORAL | 11 refills | Status: AC

## 2018-02-08 NOTE — Patient Instructions (Addendum)
Stop Furosemide  Increase Lisinopril to 10 mg daily  Labs today  Labs again in 2 weeks at: [facility]  [address], [place], [place] [address]. [phone]  Your physician has requested that you regularly monitor and record your blood pressure readings at home. Please use the same machine at the same time of day to check your readings and record them.  CALL US WITH READINGS IN 2 WEEKS.  We will contact you in 1 year to schedule your next appointment.

## 2018-02-08 NOTE — Progress Notes (Signed)
Advanced Heart Failure Clinic Note   Primary Cardiologist: Dr. Shirlee Latch  PCP: Colin Mulders  HPI:  Reginald [name] is a 57 y.o. 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 He was discharged to [facility] 08/10/17 on Taflaro and Daptomycin (Broadened due to persistent MRSA bacteremia despite supra-therapeutic vancomycin levels).  IM was re-consulted due to worsening respiratotory status requiring BiPAP and IV lasix.  HF team consulted for optimization prior to MVR. Diuresed and meds adjusted. Pt underwent MVR with bioprosthetic valve 08/29/17. Milrinone weaned off 08/31/17.  He presents today for followup of MV disease.  He is now off doxycycline.  Weight is up 16 lbs.  He says he does not get much exercise and eats what he wants to.  BP elevated.  No exertional dyspnea or chest pain.  Poor energy level.    Review of systems complete and found to be negative unless listed in HPI.    Past medical history 1. Chronic diastolic CHF: Now s/p MV replacement.   - Echo (3/19): EF 60-65%, mild LVH, moderate diastolic dysfunction, bioprosthetic mitral valve appears to function normally.  2. 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  - s/p MV replacement with bioprosthetic valve 08/29/2017 (Dr. Donata Clay).  3. Chronic Anemia: Required blood transfusion during Dec-Jan admission.  - FOBT negative.  4. C1 osteomyelitis, C4 discitis and paraspinal abscess due to MRSA bacteremia. - Received course of IV daptomycin per ID recommendations.  5. DVT: Left PT vein (below knee) in 1/19.  6. Junctional rhythm post-op MV replacement,  resolved.   7. Prior smoker 8. ETOH abuse 9. DM2 10. Aortic atheroma.  11. LHC (12/18): Mild nonobstructive CAD.  12. 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  13. COPD: 1/19 PFTs with severe obstructive disease. Prior smoker.  14. HTN   Current Outpatient Medications  Medication Sig Dispense Refill  . acetaminophen (TYLENOL 8 HOUR) 650 MG CR tablet Take 1 tablet (650 mg total) by mouth every 8 (eight) hours as needed for pain. 90 tablet 2  . aspirin EC 81 MG tablet Take 81 mg by mouth daily.    . folic acid (FOLVITE) 1 MG tablet Take 1 tablet (1 mg total) by mouth daily.    Marland Kitchen lisinopril (PRINIVIL,ZESTRIL) 10 MG tablet Take 1 tablet (10 mg total) by mouth daily. 30 tablet 11  . metFORMIN (GLUMETZA) 500 MG (MOD) 24 hr tablet Take 1 tablet (500 mg total) by mouth daily with breakfast. 90 tablet 1  . metoprolol tartrate (LOPRESSOR) 25 MG tablet TAKE 1 TABLET BY MOUTH TWICE (2) DAILY 60 tablet 1  . spironolactone (ALDACTONE) 25 MG tablet TAKE 1/2 TABLET BY MOUTH DAILY 15 tablet 3   No current facility-administered medications for this encounter.     Allergies  Allergen Reactions  . Morphine And Related Other (See Comments)    "freaks out"      Social History   Socioeconomic History  . Marital status: Married    Spouse name: Not on file  . Number of children: Not on file  . Years of education: Not on file  . Highest education level: Not on file  Occupational History  .  Not on file  Social Needs  . Financial resource strain: Not on file  . Food insecurity:    Worry: Not on file    Inability: Not on file  . Transportation needs:    Medical: Not on file    Non-medical: Not on file  Tobacco Use  . Smoking status: Former Smoker    Types: Cigarettes  . Smokeless tobacco: Never Used  Substance and Sexual Activity  . Alcohol use: No    Frequency: Never  . Drug use: No  . Sexual activity: Not Currently    Partners: Female  Lifestyle  . Physical  activity:    Days per week: Not on file    Minutes per session: Not on file  . Stress: Not on file  Relationships  . Social connections:    Talks on phone: Not on file    Gets together: Not on file    Attends religious service: Not on file    Active member of club or organization: Not on file    Attends meetings of clubs or organizations: Not on file    Relationship status: Not on file  . Intimate partner violence:    Fear of current or ex partner: Not on file    Emotionally abused: Not on file    Physically abused: Not on file    Forced sexual activity: Not on file  Other Topics Concern  . Not on file  Social History Narrative  . Not on file    Family History  Problem Relation Age of Onset  . Hypertension Mother   . Hypertension Father     Vitals:   02/08/18 1045  BP: (!) 186/100  Pulse: 68  SpO2: 96%  Weight: 259 lb 6.4 oz (117.7 kg)   Wt Readings from Last 3 Encounters:  02/08/18 259 lb 6.4 oz (117.7 kg)  11/27/17 251 lb (113.9 kg)  11/15/17 254 lb (115.2 kg)   PHYSICAL EXAM: General: NAD Neck: No JVD, no thyromegaly or thyroid nodule.  Lungs: Clear to auscultation bilaterally with normal respiratory effort. CV: Nondisplaced PMI.  Heart regular S1/S2, no S3/S4, 1/6 SEM RUSB.  No peripheral edema.  No carotid bruit.  Normal pedal pulses.  Abdomen: Soft, nontender, no hepatosplenomegaly, no distention.  Skin: Intact without lesions or rashes.  Neurologic: Alert and oriented x 3.  Psych: Normal affect. Extremities: No clubbing or cyanosis.  HEENT: Normal.   ASSESSMENT & PLAN: 1.Chronic diastolic CHF: Now s/p MV replacement, minimal dyspnea now.  He is not volume overloaded on exam.  - He can stop Lasix.  - Continue spiro 12.5 mg daily, check BMET today.  - Continue metoprolol.    2. 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 Now s/p MV replacement with bioprosthetic valve.  Valve was stable on post-op echo from 3/19.  - He will need antibiotics with  dental work.   3. DVT: Left PT vein (below knee) in 1/19.  Now off warfarin and on ASA 81 daily.  4. CKD: Stage 3.  Repeat BMET today.  5. COPD: Severe obstructive disease on 1/19 PFTs.  He is no longer smoking.  6. Thoracic aortic aneurysm: 4.2 cm in 12/18. Would do MRA chest in 12/19 to follow.  7. Aortic atheroma: Significant.  Check lipids today, start statin based on LDL.   8. Junctional rhythm: Noted post-op then resolved.  9. HTN: BP elevated.   - Increase lisinopril to 10 mg daily.  BMET today and in 10 days. He will  call us in 2 wks with his BP readings.   Followup in 1 year.   Marca Ancona, MD 02/08/18

## 2018-02-12 ENCOUNTER — Encounter (HOSPITAL_COMMUNITY): Payer: Self-pay | Admitting: *Deleted

## 2018-02-21 ENCOUNTER — Other Ambulatory Visit: Payer: Self-pay | Admitting: Cardiology

## 2018-02-21 LAB — BASIC METABOLIC PANEL
BUN/Creatinine Ratio: 16 (ref 9–20)
BUN: 13 mg/dL (ref 6–24)
CALCIUM: 9.3 mg/dL (ref 8.7–10.2)
CO2: 25 mmol/L (ref 20–29)
CREATININE: 0.83 mg/dL (ref 0.76–1.27)
Chloride: 98 mmol/L (ref 96–106)
GFR calc Af Amer: 113 mL/min/{1.73_m2} (ref >59)
GFR, EST NON AFRICAN AMERICAN: 98 mL/min/{1.73_m2} (ref >59)
GLUCOSE: 162 mg/dL — AB (ref 65–99)
Potassium: 4 mmol/L (ref 3.5–5.2)
Sodium: 135 mmol/L (ref 134–144)

## 2018-02-21 LAB — SPECIMEN STATUS REPORT

## 2018-02-27 ENCOUNTER — Other Ambulatory Visit (HOSPITAL_COMMUNITY): Payer: Self-pay | Admitting: Cardiology

## 2018-03-14 ENCOUNTER — Other Ambulatory Visit: Payer: Self-pay | Admitting: Family Medicine

## 2018-04-02 ENCOUNTER — Other Ambulatory Visit (HOSPITAL_COMMUNITY): Payer: Self-pay | Admitting: Cardiology

## 2018-04-16 DIAGNOSIS — Z736 Limitation of activities due to disability: Secondary | ICD-10-CM

## 2018-05-07 ENCOUNTER — Other Ambulatory Visit (HOSPITAL_COMMUNITY): Payer: Self-pay | Admitting: Cardiology

## 2018-05-29 ENCOUNTER — Ambulatory Visit: Payer: Self-pay | Admitting: Family Medicine

## 2018-08-08 DIAGNOSIS — Z736 Limitation of activities due to disability: Secondary | ICD-10-CM | POA: Diagnosis not present

## 2018-08-27 IMAGING — DX DG FOOT COMPLETE 3+V*L*
3 series · 3 of 3 positions shown · non-contrast
Comparison: None.

CLINICAL DATA: 56-year-old male with left foot pain. No known
injury.

EXAM:
LEFT FOOT - COMPLETE 3+ VIEW

[x foot ap left]
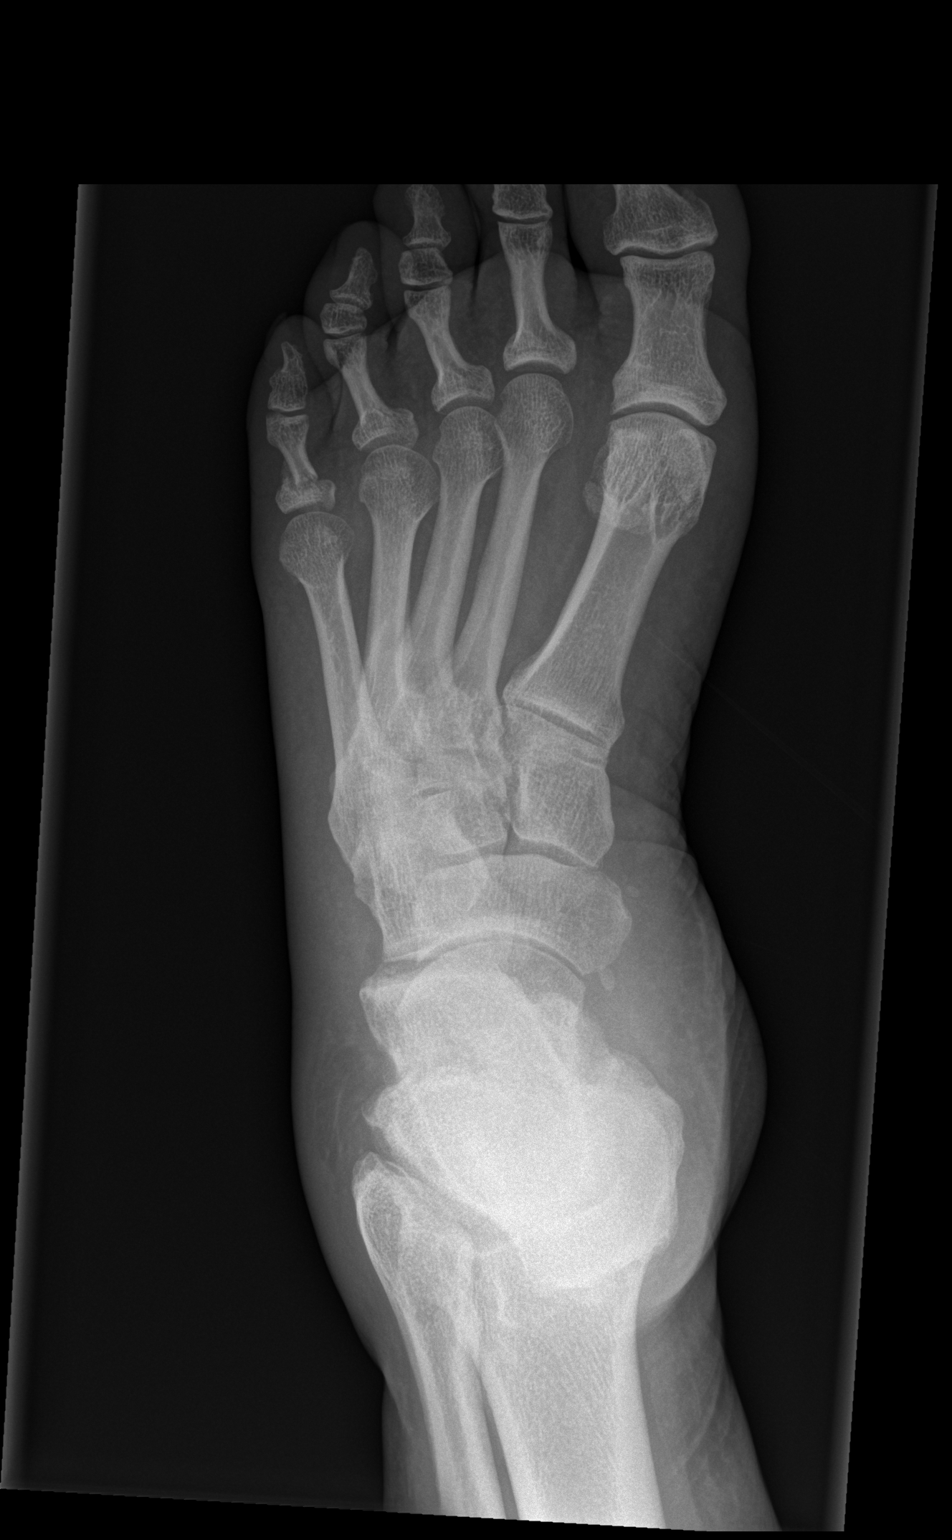

[x foot obl left]
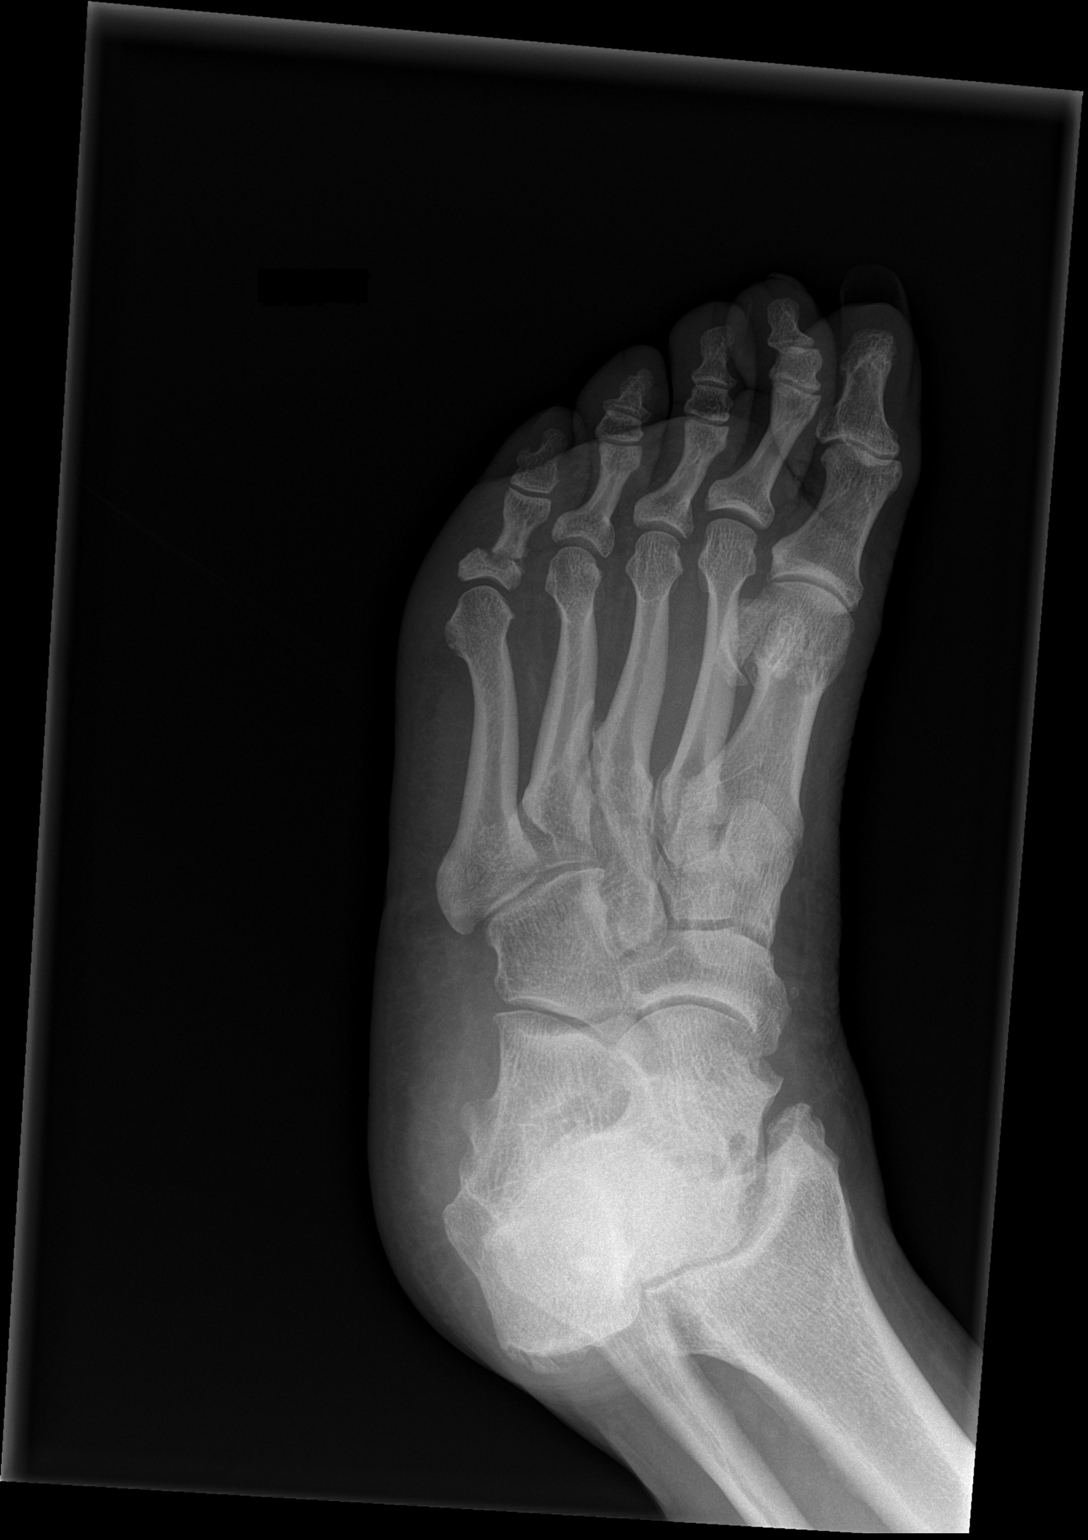

[x foot lat left]
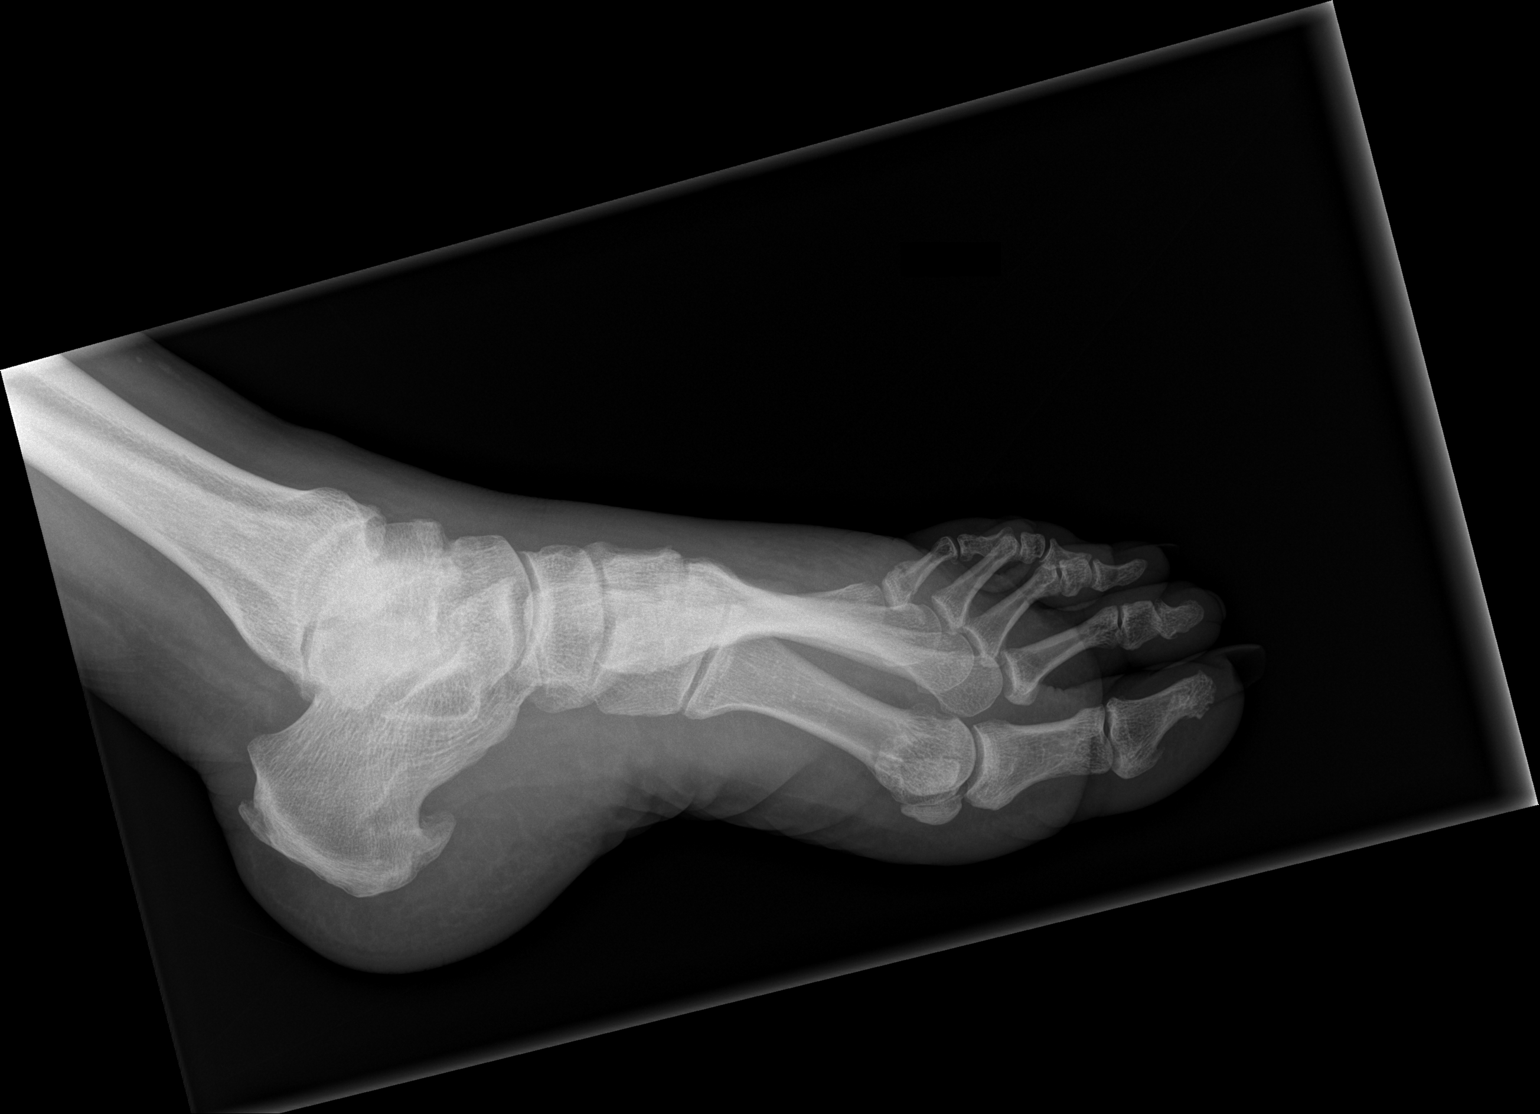

[3 of 3 positions shown; findings below may reference images not displayed]

FINDINGS: There is a transverse fracture of the base of the proximal phalanx
of the fifth digit which is age indeterminate. There appears to be
some adjacent bone reaction and callus formation. These fracture is
favored to represent a subacute or chronic fracture with incomplete
healing or nonunion and less likely an acute fracture. Clinical
correlation is recommended. No other fracture identified. There is
no dislocation. There are degenerative changes of the ankle joint.
There is diffuse soft tissue swelling of the dorsum and lateral
aspect of the foot. No radiopaque foreign object or soft tissue gas.
IMPRESSION: Fracture of the base of the proximal phalanx of the fifth digit as
described. Clinical correlation is recommended.

Diffuse soft tissue swelling of the lateral and dorsal foot.

## 2018-09-02 IMAGING — DX DG CHEST 1V PORT
1 series · 1 of 1 positions shown · non-contrast
Comparison: 08/16/2017 chest radiograph.

CLINICAL DATA: 56 y/o  M; shortness of breath.

EXAM:
PORTABLE CHEST 1 VIEW

[chest]
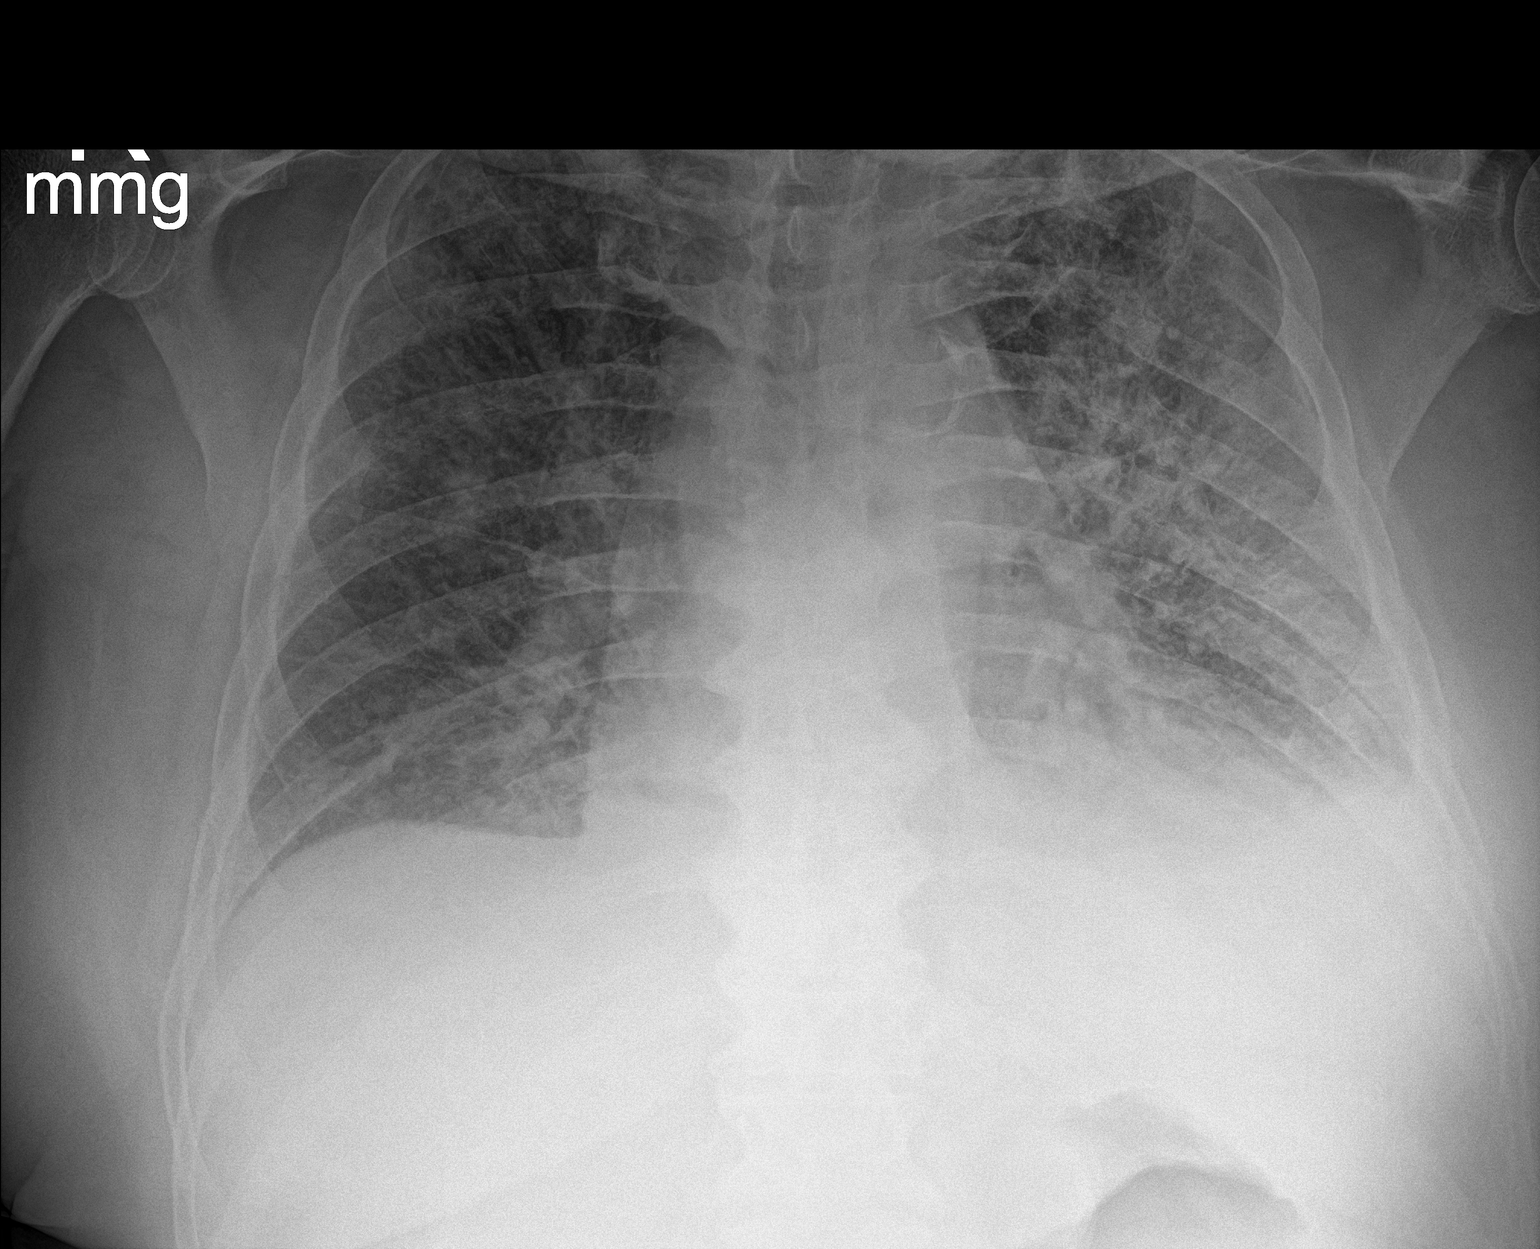

[1 of 1 positions shown; findings below may reference images not displayed]

FINDINGS: Increase reticular and patchy opacities of the lungs. Increased
small left pleural effusion. Stable cardiac silhouette. Aortic
atherosclerosis. Moderate multilevel degenerative changes of
thoracic spine.
IMPRESSION: Increase interstitial and alveolar pulmonary edema. Increased small
left pleural effusion.

By: Selvester Deen M.D.

## 2018-09-07 IMAGING — MR MR CERVICAL SPINE W/O CM
4 of 5 series · 19 of 48 positions shown · non-contrast
Comparison: MRI 08/08/2017.  MRI [REDACTED].  CT 07/20/2017.

CLINICAL DATA: Followup spinal infection

EXAM:
MRI CERVICAL SPINE WITHOUT CONTRAST
TECHNIQUE: Multiplanar, multisequence MR imaging of the cervical spine was
performed. No intravenous contrast was administered.

[Series 2: T2 · sagittal · 3.0mm · 0.43mm/px · 6 of 13 slices shown (1 of 2)]
[im 1/13]
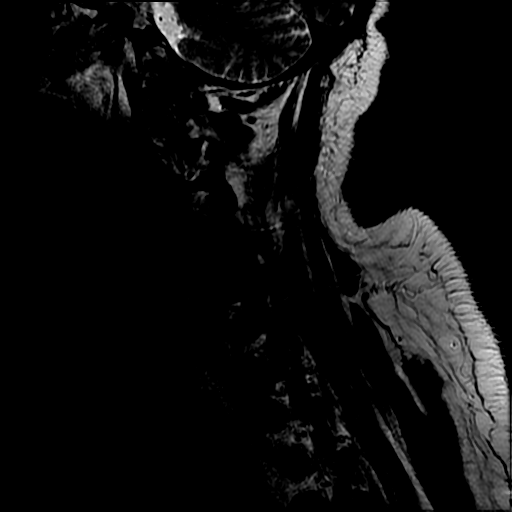
[im 3/13]
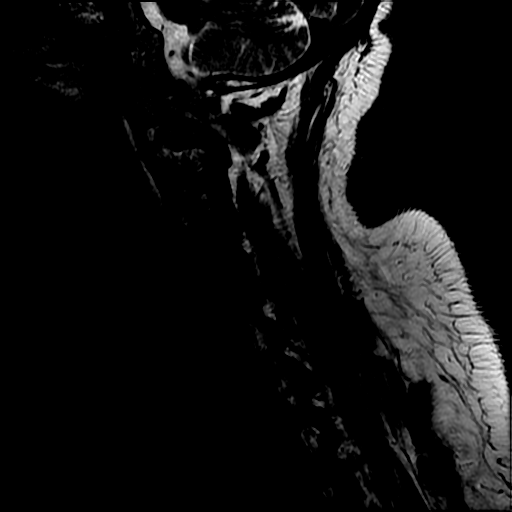
[im 5/13]
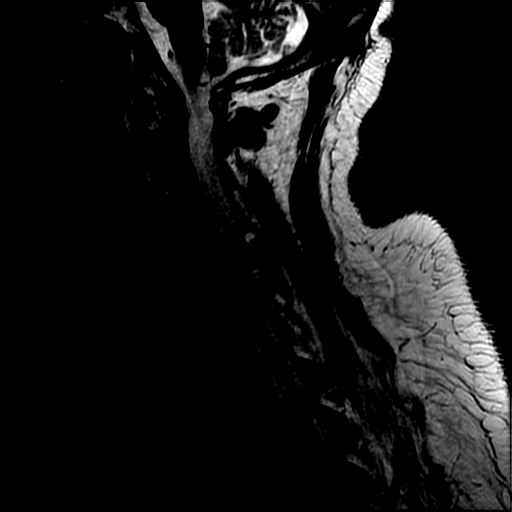
[im 8/13]
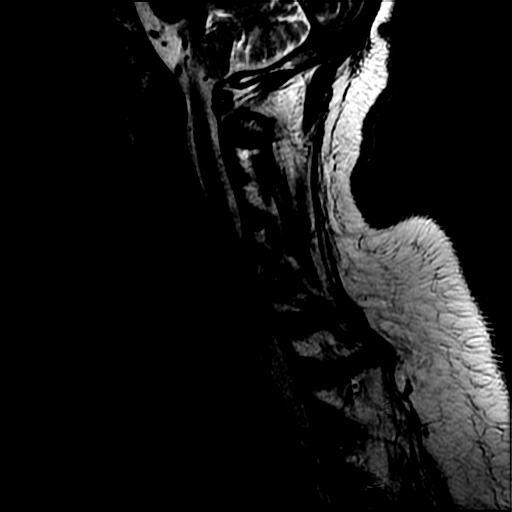
[im 10/13]
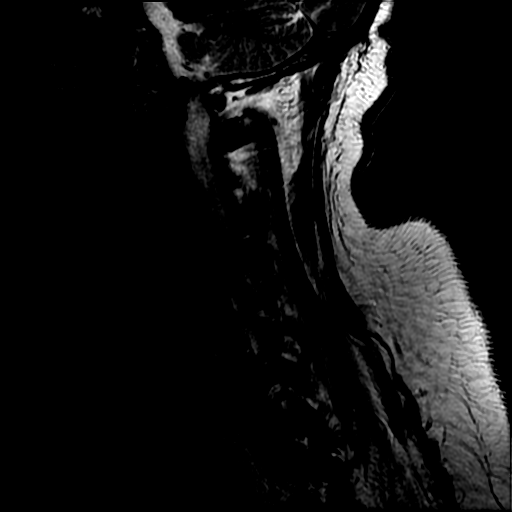
[im 13/13]
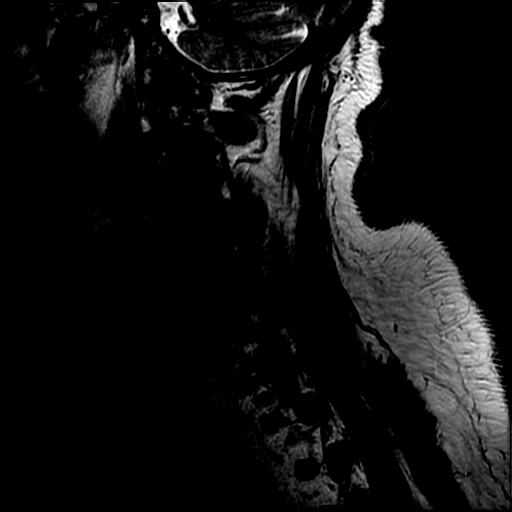

[Series 4: FLAIR · sagittal · 3.0mm · 0.43mm/px · 3 of 13 slices shown]
[im 1/13]
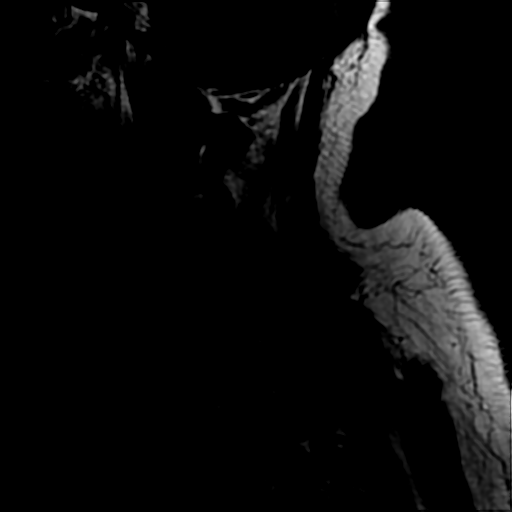
[im 7/13]
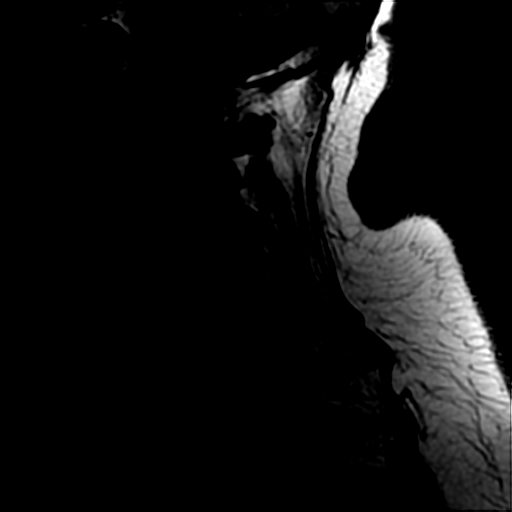
[im 13/13]
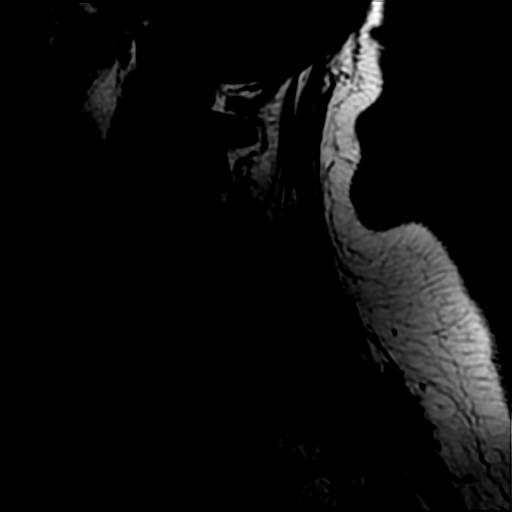

[Series 5: T2 fat-sat · sagittal · 3.0mm · 0.43mm/px · 3 of 13 slices shown]
[im 1/13]
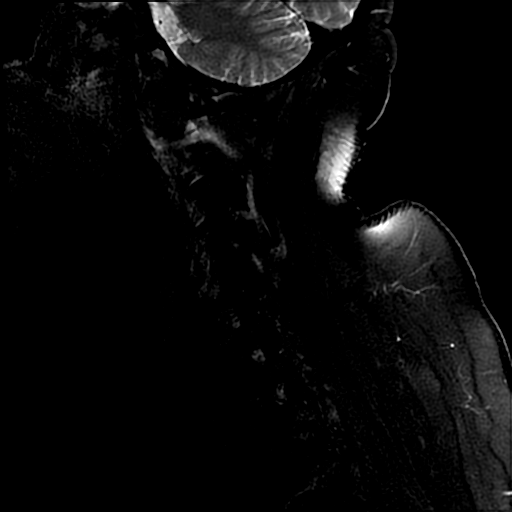
[im 7/13]
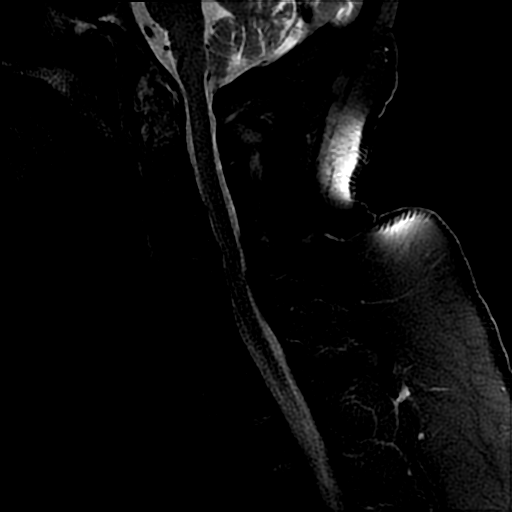
[im 13/13]
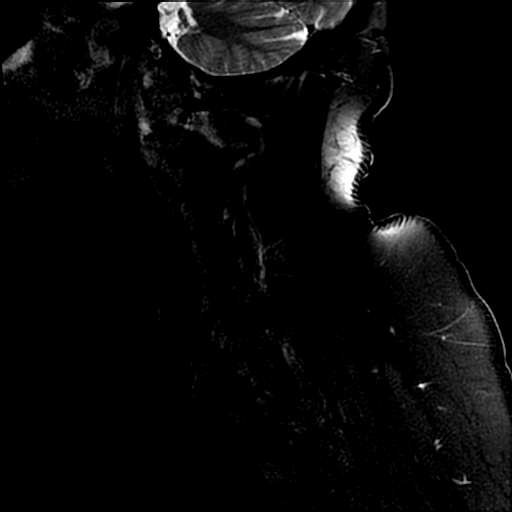

[Series 6: T2 · axial · 3.0mm · 0.43mm/px · z∈[-14,+89]mm · 7 of 39 slices shown (2 of 2)]
[im 3/39]
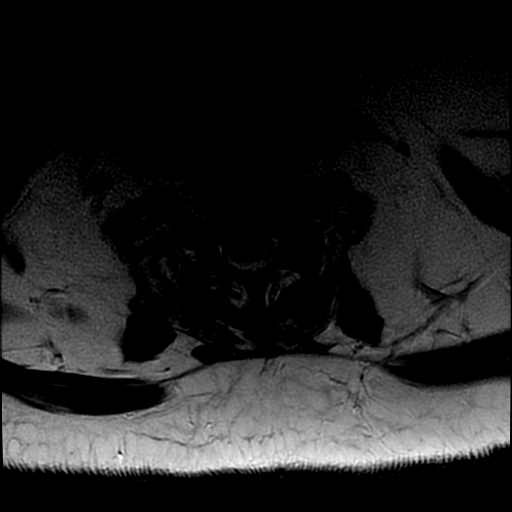
[im 6/39]
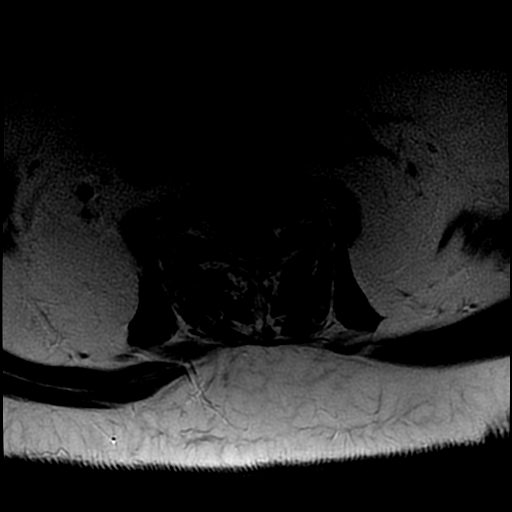
[im 8/39]
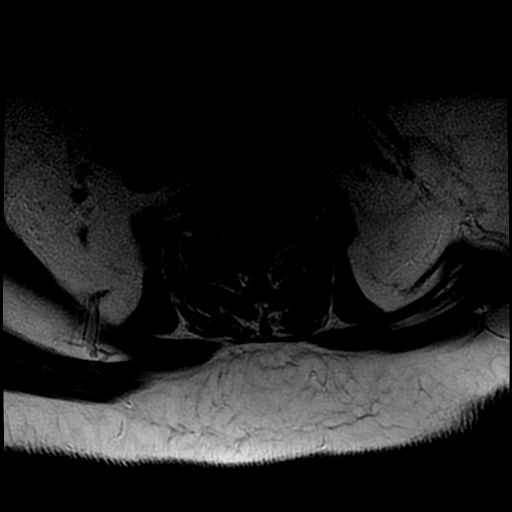
[im 13/39]
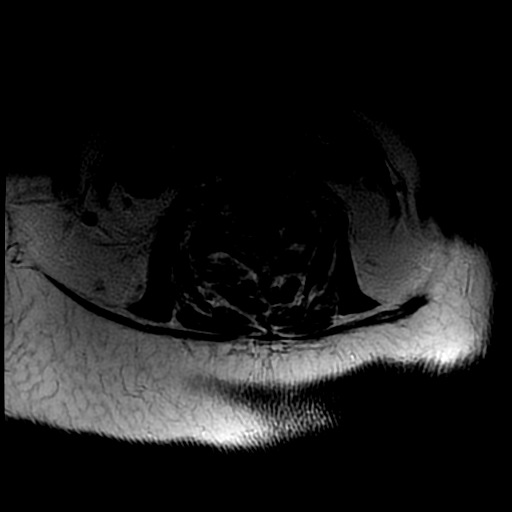
[im 18/39]
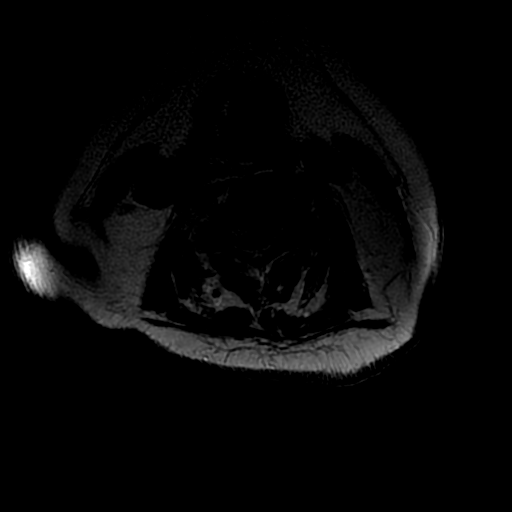
[im 21/39]
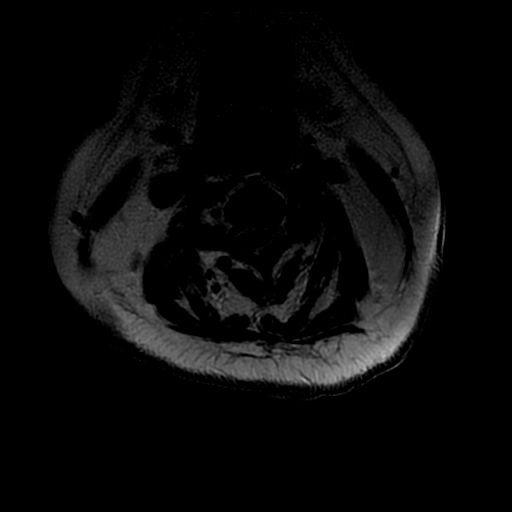
[im 33/39]
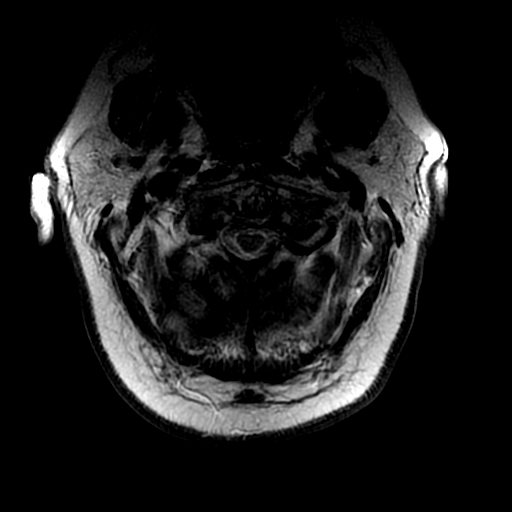

[19 of 48 positions shown; findings below may reference images not displayed]

FINDINGS: Alignment: No change in alignment.

Vertebrae: The patient has osteomyelitis at the C1-2 level with
septic arthritis of the skullbase C1 articulation and C1-C2
articulation. Today's study was ordered and performed without
contrast, hindering evaluation to some degree. I think there is
slightly less edema in the region and I think that paraspinous fluid
collections are smaller. I do not see any progressive disease or new
finding. No cord compression. Mild edema at the C4-5 level also
appears slightly improved without progression or new finding.
Chronic discogenic endplate changes at C5-6 appear the same.

Cord: No cord compression or primary cord lesion.

Posterior Fossa, vertebral arteries, paraspinal tissues: As
discussed above.

Disc levels:

As discussed above.
IMPRESSION: Today's study was done without contrast. Continued evidence of
osteomyelitis and joint infection in the C1-2 region, with evidence
of positive response to treatment since the study of 08/08/2017.
There is less regional edema and the small paraspinous fluid
collections are diminishing. No progressive or worsening findings.
There is also less edema at the C4-5 level.

No worsening or new finding.

## 2018-12-21 DEATH — deceased
